# Patient Record
Sex: Male | Born: 1955 | Race: White | Hispanic: No | Smoking: Former smoker
Health system: Southern US, Community
[De-identification: ages and names within clinical notes are randomized; demographics above are authoritative.]

## PROBLEM LIST (undated history)

## (undated) DIAGNOSIS — Z8673 Personal history of transient ischemic attack (TIA), and cerebral infarction without residual deficits: Secondary | ICD-10-CM

## (undated) DIAGNOSIS — J449 Chronic obstructive pulmonary disease, unspecified: Secondary | ICD-10-CM

## (undated) DIAGNOSIS — G8190 Hemiplegia, unspecified affecting unspecified side: Secondary | ICD-10-CM

## (undated) DIAGNOSIS — H5440 Blindness, one eye, unspecified eye: Secondary | ICD-10-CM

## (undated) DIAGNOSIS — I5189 Other ill-defined heart diseases: Secondary | ICD-10-CM

## (undated) DIAGNOSIS — I10 Essential (primary) hypertension: Secondary | ICD-10-CM

## (undated) DIAGNOSIS — F32A Depression, unspecified: Secondary | ICD-10-CM

## (undated) DIAGNOSIS — Z86711 Personal history of pulmonary embolism: Secondary | ICD-10-CM

## (undated) DIAGNOSIS — D649 Anemia, unspecified: Secondary | ICD-10-CM

## (undated) DIAGNOSIS — I82419 Acute embolism and thrombosis of unspecified femoral vein: Secondary | ICD-10-CM

## (undated) DIAGNOSIS — Z720 Tobacco use: Secondary | ICD-10-CM

## (undated) DIAGNOSIS — I639 Cerebral infarction, unspecified: Secondary | ICD-10-CM

## (undated) DIAGNOSIS — I739 Peripheral vascular disease, unspecified: Secondary | ICD-10-CM

## (undated) DIAGNOSIS — I714 Abdominal aortic aneurysm, without rupture, unspecified: Secondary | ICD-10-CM

## (undated) DIAGNOSIS — I779 Disorder of arteries and arterioles, unspecified: Secondary | ICD-10-CM

## (undated) DIAGNOSIS — E119 Type 2 diabetes mellitus without complications: Secondary | ICD-10-CM

## (undated) HISTORY — DX: Blindness, one eye, unspecified eye: H54.40

## (undated) HISTORY — PX: CAROTID ENDARTERECTOMY: SUR193

## (undated) HISTORY — DX: Chronic obstructive pulmonary disease, unspecified: J44.9

## (undated) HISTORY — DX: Essential (primary) hypertension: I10

## (undated) HISTORY — DX: Type 2 diabetes mellitus without complications: E11.9

---

## 2015-07-04 ENCOUNTER — Ambulatory Visit (INDEPENDENT_AMBULATORY_CARE_PROVIDER_SITE_OTHER): Payer: Self-pay | Admitting: Family Medicine

## 2015-07-04 ENCOUNTER — Encounter: Payer: Self-pay | Admitting: Family Medicine

## 2015-07-04 VITALS — BP 119/80 | HR 99 | Temp 98.5°F | Resp 19 | Ht 69.0 in | Wt 221.0 lb

## 2015-07-04 DIAGNOSIS — I10 Essential (primary) hypertension: Secondary | ICD-10-CM

## 2015-07-04 DIAGNOSIS — E119 Type 2 diabetes mellitus without complications: Secondary | ICD-10-CM

## 2015-07-04 DIAGNOSIS — J449 Chronic obstructive pulmonary disease, unspecified: Secondary | ICD-10-CM

## 2015-07-04 DIAGNOSIS — H5440 Blindness, one eye, unspecified eye: Secondary | ICD-10-CM | POA: Insufficient documentation

## 2015-07-04 DIAGNOSIS — H5441 Blindness, right eye, normal vision left eye: Secondary | ICD-10-CM

## 2015-07-04 LAB — GLUCOSE, POCT (MANUAL RESULT ENTRY): POC GLUCOSE: 399 mg/dL — AB (ref 70–99)

## 2015-07-04 LAB — POCT GLYCOSYLATED HEMOGLOBIN (HGB A1C): Hemoglobin A1C: 9.5

## 2015-07-04 NOTE — Progress Notes (Signed)
Name: Chad Combs   MRN: [id number]    DOB: 1955-10-01   Date:07/04/2015       Progress Note  Subjective  Chief Complaint  Chief Complaint  Patient presents with  . Establish Care    NP    Diabetes He presents for his follow-up diabetic visit. He has type 2 diabetes mellitus. His disease course has been stable. There are no hypoglycemic associated symptoms. Pertinent negatives for hypoglycemia include no headaches. Associated symptoms include blurred vision (cannot see out of the right eye), foot paresthesias, polydipsia and polyuria. Pertinent negatives for diabetes include no chest pain and no fatigue. Symptoms are stable. Diabetic complications include peripheral neuropathy. Pertinent negatives for diabetic complications include no CVA (had a 'stroke in the right eye') or heart disease. Current diabetic treatment includes oral agent (monotherapy). His weight is stable. He is following a diabetic diet. Frequency home blood tests: does not check his Blood Glucose. An ACE inhibitor/angiotensin II receptor blocker is being taken.  Hypertension This is a chronic problem. The problem is controlled. Associated symptoms include blurred vision (cannot see out of the right eye) and shortness of breath. Pertinent negatives include no chest pain, headaches or palpitations. Past treatments include ACE inhibitors. There is no history of kidney disease, CAD/MI or CVA (had a 'stroke in the right eye').    Past Medical History  Diagnosis Date  . Diabetes mellitus without complication ([place])   . Hypertension   . COPD (chronic obstructive pulmonary disease) ([place])     History reviewed. No pertinent past surgical history.  History reviewed. No pertinent family history.  Social History   Social History  . Marital Status: Single    Spouse Name: N/A  . Number of Children: N/A  . Years of Education: N/A   Occupational History  . Not on file.   Social History Main Topics  . Smoking status: Current  Every Day Smoker -- 1.00 packs/day    Types: Cigarettes  . Smokeless tobacco: Never Used  . Alcohol Use: 0.0 oz/week    0 Standard drinks or equivalent per week     Comment: occasional  . Drug Use: No  . Sexual Activity: No   Other Topics Concern  . Not on file   Social History Narrative  . No narrative on file     Current outpatient prescriptions:  .  lisinopril (PRINIVIL,ZESTRIL) 20 MG tablet, Take 20 mg by mouth daily., Disp: , Rfl:  .  metFORMIN (GLUCOPHAGE) 500 MG tablet, Take 2 tablets by mouth 2 (two) times daily., Disp: , Rfl:   No Known Allergies   Review of Systems  Constitutional: Negative for fever, chills and fatigue.  Eyes: Positive for blurred vision (cannot see out of the right eye). Negative for double vision and pain.  Respiratory: Positive for shortness of breath.   Cardiovascular: Negative for chest pain and palpitations.  Gastrointestinal: Negative for abdominal pain.  Neurological: Negative for headaches.  Endo/Heme/Allergies: Positive for polydipsia.     Objective  Filed Vitals:   07/04/15 1032  BP: 119/80  Pulse: 99  Temp: 98.5 F (36.9 C)  TempSrc: Oral  Resp: 19  Height: 5\' 9"  (1.753 m)  Weight: 221 lb (100.245 kg)  SpO2: 94%    Physical Exam  Constitutional: He is oriented to person, place, and time and well-developed, well-nourished, and in no distress.  HENT:  Head: Normocephalic and atraumatic.  Eyes:  Right pupil non reactive to light, fixed and dilated  Cardiovascular: Normal rate, regular  rhythm and normal heart sounds.   Pulmonary/Chest: Effort normal and breath sounds normal.  Abdominal: Soft. Bowel sounds are normal.  Musculoskeletal: He exhibits edema (1+ pitting edema LE).  Neurological: He is alert and oriented to person, place, and time.  Nursing note and vitals reviewed.  Assessment & Plan  1. Essential hypertension BP well controlled on present therapy.  2. Diabetes mellitus without complication (HCC) We  will obtain A1c, glucose, lipids for assessment of diabetes status.  Will adjust medication accordingly. - POCT HgB A1C - POCT Glucose (CBG) - Lipid Profile - Comprehensive Metabolic Panel (CMET) - Urine Microalbumin w/creat. ratio  3. Blind right eye  4. Chronic obstructive pulmonary disease, unspecified COPD type ([place])    Wretha Laris Asad A. [facility] 07/04/2015 11:27 AM

## 2015-07-24 ENCOUNTER — Ambulatory Visit: Payer: Self-pay | Admitting: Family Medicine

## 2015-08-07 ENCOUNTER — Ambulatory Visit: Payer: Self-pay

## 2016-05-04 ENCOUNTER — Encounter: Payer: Self-pay | Admitting: Emergency Medicine

## 2016-05-04 ENCOUNTER — Emergency Department
Admission: EM | Admit: 2016-05-04 | Discharge: 2016-05-04 | Disposition: A | Discharge status: Home / Self Care | Payer: Self-pay | Attending: Emergency Medicine | Admitting: Emergency Medicine

## 2016-05-04 ENCOUNTER — Emergency Department: Payer: Self-pay

## 2016-05-04 DIAGNOSIS — I82412 Acute embolism and thrombosis of left femoral vein: Secondary | ICD-10-CM

## 2016-05-04 DIAGNOSIS — I82402 Acute embolism and thrombosis of unspecified deep veins of left lower extremity: Secondary | ICD-10-CM | POA: Insufficient documentation

## 2016-05-04 DIAGNOSIS — I10 Essential (primary) hypertension: Secondary | ICD-10-CM | POA: Insufficient documentation

## 2016-05-04 DIAGNOSIS — Z79899 Other long term (current) drug therapy: Secondary | ICD-10-CM | POA: Insufficient documentation

## 2016-05-04 DIAGNOSIS — Z7984 Long term (current) use of oral hypoglycemic drugs: Secondary | ICD-10-CM | POA: Insufficient documentation

## 2016-05-04 DIAGNOSIS — F17210 Nicotine dependence, cigarettes, uncomplicated: Secondary | ICD-10-CM | POA: Insufficient documentation

## 2016-05-04 DIAGNOSIS — E119 Type 2 diabetes mellitus without complications: Secondary | ICD-10-CM | POA: Insufficient documentation

## 2016-05-04 DIAGNOSIS — J449 Chronic obstructive pulmonary disease, unspecified: Secondary | ICD-10-CM | POA: Insufficient documentation

## 2016-05-04 MED ORDER — APIXABAN 5 MG PO TABS: 10.0000 mg | ORAL_TABLET | Freq: Two times a day (BID) | ORAL | 1 refills | Status: DC

## 2016-05-04 NOTE — ED Notes (Signed)
Pt to ed with c/o left leg swelling since this am.  Denies injury to leg.  Pt also reports pain in leg.  Pt is able to ambulate with assistance on leg. Family with pt at this time.

## 2016-05-04 NOTE — Care Management Note (Signed)
Case Management Note  Patient Details  Name: Chad Combs MRN: [id number] Date of Birth: 1955/06/27  Subjective/Objective:                Called [facility] across th street at MD request. They have Eliquis right now and can acomodate the patient. I have spoken to the patient and his son, and given them directions.  The son says he did set up the patient a year or so ago, and is aware of the location.   Action/Plan:   Expected Discharge Date:                  Expected Discharge Plan:     In-House Referral:     Discharge planning Services     Post Acute Care Choice:    Choice offered to:     DME Arranged:    DME Agency:     HH Arranged:    HH Agency:     Status of Service:     If discussed at [employer] of Stay Meetings, dates discussed:    Additional Comments:  Beau Fanny, RN 05/04/2016, 2:50 PM

## 2016-05-04 NOTE — ED Triage Notes (Signed)
Patient presents to the ED with redness, pain and swelling in his left leg.  Patient reports noting left thigh pain several days ago but over the night pain and swelling has progressed significantly.  Patient reports pain is increased with ambulation.

## 2016-05-04 NOTE — ED Provider Notes (Signed)
[facility], [place] Emergency Department Provider Note  ____________________________________________  Time seen: Approximately 2:55 PM  I have reviewed the triage vital signs and the nursing notes.   HISTORY  Chief Complaint Leg Swelling    HPI Chad Combs is a 60 y.o. male who complains of pain that started in his left thigh about 2 days ago, and then when he woke up this morning the entire leg on the left was swollen and red and painful. There had anything like this before. Denies any history of DVT or PE. No fever chills chest pain or shortness of breath. Has a history of hypertension and diabetes which is managed by [facility], who also provides his medications. No recent travel, hospitalization or surgery. He does smoke. Has a history of COPD as well.     Past Medical History:  Diagnosis Date  . Blind right eye   . COPD (chronic obstructive pulmonary disease) ([place])   . Diabetes mellitus without complication ([place])   . Hypertension      Patient Active Problem List   Diagnosis Date Noted  . Hypertension 07/04/2015  . Diabetes mellitus without complication ([place]) 0000000  . 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 COPD (chronic obstructive pulmonary disease) ([place]) 07/04/2015     History reviewed. No pertinent surgical history.   Prior to Admission medications   Medication Sig Start Date End Date Taking? Authorizing Provider  apixaban (ELIQUIS) 5 MG TABS tablet Take 2 tablets (10 mg total) by mouth 2 (two) times daily. After 7 days, decrease dose to 1 tablet (5 mg total) by mouth 2 (two) times daily. 05/04/16   Carrie Mew, MD  lisinopril (PRINIVIL,ZESTRIL) 20 MG tablet Take 20 mg by mouth daily.    Historical Provider, MD  metFORMIN (GLUCOPHAGE) 500 MG tablet Take 2 tablets by mouth 2 (two) times daily.    Historical Provider, MD     Allergies Patient has no known allergies.   Family History  Problem Relation Age of Onset   . Cancer Mother   . Heart disease Father     Social History Social History  Substance Use Topics  . Smoking status: Current Every Day Smoker    Packs/day: 1.00    Types: Cigarettes  . Smokeless tobacco: Never Used  . Alcohol use 0.0 oz/week     Comment: occasional (once every couple months)    Review of Systems  Constitutional:   No fever or chills.   Cardiovascular:   No chest pain. Respiratory:   No dyspnea or cough.  Musculoskeletal:  Left leg pain and swelling Neurological:   Negative for headaches 10-point ROS otherwise negative.  ____________________________________________   PHYSICAL EXAM:  VITAL SIGNS: ED Triage Vitals [05/04/16 1145]  Enc Vitals Group     BP (!) 140/98     Pulse Rate 87     Resp 16     Temp 97.6 F (36.4 C)     Temp Source Oral     SpO2 95 %     Weight 269 lb (122 kg)     Height 5\' 9"  (1.753 m)     Head Circumference      Peak Flow      Pain Score 8     Pain Loc      Pain Edu?      Excl. in [place]?     Vital signs reviewed, nursing assessments reviewed.   Constitutional:   Alert and oriented. Well appearing and in no distress.  Eyes:   No scleral icterus. No conjunctival pallor. PERRL. EOMI.  No nystagmus. ENT   Head:   Normocephalic and atraumatic.   Nose:   No congestion/rhinnorhea. No septal hematoma   Mouth/Throat:   MMM, no pharyngeal erythema. No peritonsillar mass.    Neck:   No stridor. No SubQ emphysema. No meningismus. Hematological/Lymphatic/Immunilogical:   No cervical lymphadenopathy. Cardiovascular:   RRR. Symmetric bilateral radial and DP pulses.  No murmurs.  Respiratory:   Normal respiratory effort without tachypnea nor retractions. Breath sounds are clear and equal bilaterally. No wheezes/rales/rhonchi. Musculoskeletal:   Diffuse faint erythema of the left leg with pitting edema. Right lower extremity is normal. Left calf circumference much greater. Mild tenderness to the touch in the posterior  thigh. Neurologic:   Normal speech and language.  CN 2-10 normal. Motor grossly intact. No gross focal neurologic deficits are appreciated.  Skin:    Skin is warm, dry and intact. No rash noted.  No petechiae, purpura, or bullae.  ____________________________________________    LABS (pertinent positives/negatives) (all labs ordered are listed, but only abnormal results are displayed) Labs Reviewed - No data to display ____________________________________________   EKG    ____________________________________________    RADIOLOGY  Ultrasound left lower extremity reveals acute occlusive diffuse DVT.  ____________________________________________   PROCEDURES Procedures  ____________________________________________   INITIAL IMPRESSION / ASSESSMENT AND PLAN / ED COURSE  Pertinent labs & imaging results that were available during my care of the patient were reviewed by me and considered in my medical decision making (see chart for details).  Patient presents with DVT in the left leg. Uncomplicated currently. No clear etiology of this event other than smoking and comorbidities. No evidence of PE at this time. Vital signs are unremarkable the patient is calm and comfortable and not in distress. I worked with case management who called over to a medication management clinic and confirmed that they would be able to provide this patient Eliquis. I have written a prescription for the initial 7 day loading course followed by continued medication will have the patient follow-up with his doctor tomorrow for continued management throughout this illness and ensuring that he has access to medication. No contraindications identified to anticoagulation. No history of brain surgery or metastatic cancer. No gastrointestinal bleeding. Return precautions given regarding worsening symptoms including chest pain shortness of breath or dizziness.   Clinical Course     ____________________________________________   FINAL CLINICAL IMPRESSION(S) / ED DIAGNOSES  Final diagnoses:  Acute deep vein thrombosis (DVT) of femoral vein of left lower extremity (HCC)       Portions of this note were generated with dragon dictation software. Dictation errors may occur despite best attempts at proofreading.    Carrie Mew, MD 05/04/16 1500

## 2016-07-26 ENCOUNTER — Ambulatory Visit: Payer: Self-pay | Admitting: Pharmacy Technician

## 2016-07-26 ENCOUNTER — Encounter (INDEPENDENT_AMBULATORY_CARE_PROVIDER_SITE_OTHER): Payer: Self-pay

## 2016-07-26 DIAGNOSIS — Z79899 Other long term (current) drug therapy: Secondary | ICD-10-CM

## 2016-07-26 NOTE — Progress Notes (Signed)
Completed [facility] application and contract.  Patient agreed to all terms of the [facility] contract.  Patient to provide 2018 investment statement from [employer] and 2018 bank statement from [employer].  Provided patient with Civil engineer, contracting based on his particular needs.    Eliquis PAP application submitted to [facility].    [facility]

## 2016-08-21 ENCOUNTER — Emergency Department: Payer: Self-pay

## 2016-08-21 ENCOUNTER — Encounter: Payer: Self-pay | Admitting: Emergency Medicine

## 2016-08-21 ENCOUNTER — Inpatient Hospital Stay
Admission: EM | Admit: 2016-08-21 | Discharge: 2016-08-25 | DRG: 040 | Disposition: A | Discharge status: Rehabilitation Facility | Payer: Self-pay | Attending: Internal Medicine | Admitting: Internal Medicine

## 2016-08-21 DIAGNOSIS — Z86718 Personal history of other venous thrombosis and embolism: Secondary | ICD-10-CM

## 2016-08-21 DIAGNOSIS — G936 Cerebral edema: Secondary | ICD-10-CM | POA: Diagnosis present

## 2016-08-21 DIAGNOSIS — Z9114 Patient's other noncompliance with medication regimen: Secondary | ICD-10-CM

## 2016-08-21 DIAGNOSIS — F17210 Nicotine dependence, cigarettes, uncomplicated: Secondary | ICD-10-CM | POA: Diagnosis present

## 2016-08-21 DIAGNOSIS — I2510 Atherosclerotic heart disease of native coronary artery without angina pectoris: Secondary | ICD-10-CM | POA: Diagnosis present

## 2016-08-21 DIAGNOSIS — I10 Essential (primary) hypertension: Secondary | ICD-10-CM | POA: Diagnosis present

## 2016-08-21 DIAGNOSIS — E781 Pure hyperglyceridemia: Secondary | ICD-10-CM | POA: Diagnosis present

## 2016-08-21 DIAGNOSIS — E875 Hyperkalemia: Secondary | ICD-10-CM | POA: Diagnosis present

## 2016-08-21 DIAGNOSIS — E119 Type 2 diabetes mellitus without complications: Secondary | ICD-10-CM | POA: Diagnosis present

## 2016-08-21 DIAGNOSIS — I639 Cerebral infarction, unspecified: Secondary | ICD-10-CM | POA: Diagnosis present

## 2016-08-21 DIAGNOSIS — Z79899 Other long term (current) drug therapy: Secondary | ICD-10-CM

## 2016-08-21 DIAGNOSIS — Z8673 Personal history of transient ischemic attack (TIA), and cerebral infarction without residual deficits: Secondary | ICD-10-CM

## 2016-08-21 DIAGNOSIS — D72829 Elevated white blood cell count, unspecified: Secondary | ICD-10-CM | POA: Diagnosis present

## 2016-08-21 DIAGNOSIS — K760 Fatty (change of) liver, not elsewhere classified: Secondary | ICD-10-CM | POA: Diagnosis present

## 2016-08-21 DIAGNOSIS — R531 Weakness: Secondary | ICD-10-CM

## 2016-08-21 DIAGNOSIS — R0602 Shortness of breath: Secondary | ICD-10-CM

## 2016-08-21 DIAGNOSIS — R29810 Facial weakness: Secondary | ICD-10-CM | POA: Diagnosis present

## 2016-08-21 DIAGNOSIS — Z7984 Long term (current) use of oral hypoglycemic drugs: Secondary | ICD-10-CM

## 2016-08-21 DIAGNOSIS — O2230 Deep phlebothrombosis in pregnancy, unspecified trimester: Secondary | ICD-10-CM

## 2016-08-21 DIAGNOSIS — I63511 Cerebral infarction due to unspecified occlusion or stenosis of right middle cerebral artery: Principal | ICD-10-CM | POA: Diagnosis present

## 2016-08-21 DIAGNOSIS — I6523 Occlusion and stenosis of bilateral carotid arteries: Secondary | ICD-10-CM | POA: Diagnosis present

## 2016-08-21 DIAGNOSIS — D751 Secondary polycythemia: Secondary | ICD-10-CM | POA: Diagnosis present

## 2016-08-21 DIAGNOSIS — Z7901 Long term (current) use of anticoagulants: Secondary | ICD-10-CM

## 2016-08-21 DIAGNOSIS — H5461 Unqualified visual loss, right eye, normal vision left eye: Secondary | ICD-10-CM | POA: Diagnosis present

## 2016-08-21 DIAGNOSIS — J449 Chronic obstructive pulmonary disease, unspecified: Secondary | ICD-10-CM | POA: Diagnosis present

## 2016-08-21 DIAGNOSIS — G8194 Hemiplegia, unspecified affecting left nondominant side: Secondary | ICD-10-CM | POA: Diagnosis present

## 2016-08-21 LAB — CBC
HCT: 58.9 % — ABNORMAL HIGH (ref 40.0–52.0)
HEMOGLOBIN: 20.6 g/dL — AB (ref 13.0–18.0)
MCH: 33.3 pg (ref 26.0–34.0)
MCHC: 34.9 g/dL (ref 32.0–36.0)
MCV: 95.3 fL (ref 80.0–100.0)
PLATELETS: 160 10*3/uL (ref 150–440)
RBC: 6.18 MIL/uL — AB (ref 4.40–5.90)
RDW: 13.1 % (ref 11.5–14.5)
WBC: 17.9 10*3/uL — AB (ref 3.8–10.6)

## 2016-08-21 LAB — COMPREHENSIVE METABOLIC PANEL
ALBUMIN: 4.6 g/dL (ref 3.5–5.0)
ALK PHOS: 76 U/L (ref 38–126)
ALT: 120 U/L — AB (ref 17–63)
ANION GAP: 12 (ref 5–15)
AST: 59 U/L — ABNORMAL HIGH (ref 15–41)
BILIRUBIN TOTAL: 1.2 mg/dL (ref 0.3–1.2)
BUN: 20 mg/dL (ref 6–20)
CALCIUM: 9.7 mg/dL (ref 8.9–10.3)
CO2: 22 mmol/L (ref 22–32)
CREATININE: 1.05 mg/dL (ref 0.61–1.24)
Chloride: 100 mmol/L — ABNORMAL LOW (ref 101–111)
GFR calc non Af Amer: >60 mL/min (ref >60)
GLUCOSE: 305 mg/dL — AB (ref 65–99)
Potassium: 5.7 mmol/L — ABNORMAL HIGH (ref 3.5–5.1)
Sodium: 134 mmol/L — ABNORMAL LOW (ref 135–145)
TOTAL PROTEIN: 8.4 g/dL — AB (ref 6.5–8.1)

## 2016-08-21 LAB — DIFFERENTIAL
Basophils Absolute: 0.2 10*3/uL — ABNORMAL HIGH (ref 0–0.1)
Basophils Relative: 1 %
EOS PCT: 0 %
Eosinophils Absolute: 0 10*3/uL (ref 0–0.7)
LYMPHS ABS: 1.4 10*3/uL (ref 1.0–3.6)
LYMPHS PCT: 8 %
Monocytes Absolute: 0.7 10*3/uL (ref 0.2–1.0)
Monocytes Relative: 4 %
NEUTROS ABS: 15.7 10*3/uL — AB (ref 1.4–6.5)
Neutrophils Relative %: 87 %

## 2016-08-21 LAB — PROTIME-INR
INR: 1
Prothrombin Time: 13.2 seconds (ref 11.4–15.2)

## 2016-08-21 LAB — TROPONIN I: Troponin I: <0.03 ng/mL (ref <0.03)

## 2016-08-21 LAB — GLUCOSE, CAPILLARY: Glucose-Capillary: 314 mg/dL — ABNORMAL HIGH (ref 65–99)

## 2016-08-21 LAB — APTT: aPTT: 32 seconds (ref 24–36)

## 2016-08-21 MED ORDER — ASPIRIN 81 MG PO CHEW
324.0000 mg | CHEWABLE_TABLET | Freq: Once | ORAL | Status: AC
  Administered 2016-08-21: 324 mg via ORAL
  Filled 2016-08-21: qty 4

## 2016-08-21 MED ORDER — LISINOPRIL 20 MG PO TABS
20.0000 mg | ORAL_TABLET | Freq: Every day | ORAL | Status: DC
  Administered 2016-08-22 – 2016-08-25 (×3): 20 mg via ORAL
  Filled 2016-08-21 (×3): qty 1

## 2016-08-21 NOTE — ED Provider Notes (Signed)
[facility] Emergency Department Provider Note   ____________________________________________   I have reviewed the triage vital signs and the nursing notes.   HISTORY  Chief Complaint Left sided weakness  History limited by: Poor historian   HPI Chad Combs is a 61 y.o. male who presents to the emergency department today via EMS because of concerns for being found on the ground and left-sided weakness. Per EMS family found him on the ground after not seen any activity on his face book page for roughly 10 hours. The patient saw his breakfast tray out. The patient himself states that he think he fell in the afternoon. The patient denies any complaints of left-sided weakness or slurred speech. Patient does have a history of stroke. Patient apparently also has history of deep vein thrombosis but denies being on any blood thinning medication.    Past Medical History:  Diagnosis Date  . Blind right eye   . COPD (chronic obstructive pulmonary disease) ([place])   . Diabetes mellitus without complication ([place])   . Hypertension     Patient Active Problem List   Diagnosis Date Noted  . Hypertension 07/04/2015  . Diabetes mellitus without complication ([place]) 0000000  . 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 COPD (chronic obstructive pulmonary disease) ([place]) 07/04/2015    No past surgical history on file.  Prior to Admission medications   Medication Sig Start Date End Date Taking? Authorizing Provider  apixaban (ELIQUIS) 5 MG TABS tablet Take 2 tablets (10 mg total) by mouth 2 (two) times daily. After 7 days, decrease dose to 1 tablet (5 mg total) by mouth 2 (two) times daily. 05/04/16   Carrie Mew, MD  lisinopril (PRINIVIL,ZESTRIL) 20 MG tablet Take 20 mg by mouth daily.    Historical Provider, MD  metFORMIN (GLUCOPHAGE) 500 MG tablet Take 2 tablets by mouth 2 (two) times daily.    Historical Provider, MD    Allergies Patient has no known allergies.  Family  History  Problem Relation Age of Onset  . Cancer Mother   . Heart disease Father     Social History Social History  Substance Use Topics  . Smoking status: Current Every Day Smoker    Packs/day: 1.00    Types: Cigarettes  . Smokeless tobacco: Never Used  . Alcohol use 0.0 oz/week     Comment: occasional (once every couple months)    Review of Systems  Constitutional: Negative for fever. Cardiovascular: Negative for chest pain. Respiratory: Negative for shortness of breath. Gastrointestinal: Negative for abdominal pain, vomiting and diarrhea. Neurological: Negative for headaches, focal weakness or numbness. Negative for slurred speech.  10-point ROS otherwise negative.  ____________________________________________   PHYSICAL EXAM:  VITAL SIGNS: ED Triage Vitals  Enc Vitals Group     BP 132/82     Pulse 92     Resp 18     Temp 97.5     Temp src      SpO2 94    Constitutional: Awake and alert. Not completely oriented to events.  Eyes: Conjunctivae are normal. Normal extraocular movements. PERRL. ENT   Head: Normocephalic and atraumatic.   Nose: No congestion/rhinnorhea.   Mouth/Throat: Mucous membranes are moist.   Neck: No stridor. Hematological/Lymphatic/Immunilogical: No cervical lymphadenopathy. Cardiovascular: Normal rate, regular rhythm.  No murmurs, rubs, or gallops.  Respiratory: Normal respiratory effort without tachypnea nor retractions. Breath sounds are clear and equal bilaterally. No wheezes/rales/rhonchi. Gastrointestinal: Soft and non tender. No rebound. No guarding.  Genitourinary: Deferred Musculoskeletal:  Normal range of motion in all extremities. No lower extremity edema. Neurologic:  Slurred speech. Left facial droop. Left arm unable to elevate off the bed. Left leg unable to elevate off the bed. Patient states sensation is intact in all limbs.  Skin:  Skin is warm, dry and intact. No rash  noted.  ____________________________________________    LABS (pertinent positives/negatives)  Labs Reviewed  CBC - Abnormal; Notable for the following:       Result Value   WBC 17.9 (*)    RBC 6.18 (*)    Hemoglobin 20.6 (*)    HCT 58.9 (*)    All other components within normal limits  DIFFERENTIAL - Abnormal; Notable for the following:    Neutro Abs 15.7 (*)    Basophils Absolute 0.2 (*)    All other components within normal limits  COMPREHENSIVE METABOLIC PANEL - Abnormal; Notable for the following:    Sodium 134 (*)    Potassium 5.7 (*)    Chloride 100 (*)    Glucose, Bld 305 (*)    Total Protein 8.4 (*)    AST 59 (*)    ALT 120 (*)    All other components within normal limits  PROTIME-INR  APTT  TROPONIN I  CBG MONITORING, ED   \  ____________________________________________   EKG  I, Nance Pear, attending physician, personally viewed and interpreted this EKG  EKG Time: 2048 Rate: 86 Rhythm: normal sinus rhythm Axis: normal Intervals: qtc 444 QRS: narrow, q waves V1, V2 ST changes: no st elevation Impression: abnormal ekg  ____________________________________________    RADIOLOGY  CT head IMPRESSION:  1. Evolving acute infarct at the right parietal and temporal lobes,  and right basal ganglia. Mild mass-effect on the frontal horn of the  right lateral ventricle. No midline shift seen. No evidence of  hemorrhagic transformation at this time.  2. Minimally displaced fracture through the distal tip of the nasal  bone, of indeterminate age.   I, Nance Pear, personally discussed these images and results by phone with the on-call radiologist and used this discussion as part of my medical decision making.   ____________________________________________   PROCEDURES  Procedures  CRITICAL CARE Performed by: Nance Pear   Total critical care time: 40 minutes  Critical care time was exclusive of separately billable procedures and  treating other patients.  Critical care was necessary to treat or prevent imminent or life-threatening deterioration.  Critical care was time spent personally by me on the following activities: development of treatment plan with patient and/or surrogate as well as nursing, discussions with consultants, evaluation of patient's response to treatment, examination of patient, obtaining history from patient or surrogate, ordering and performing treatments and interventions, ordering and review of laboratory studies, ordering and review of radiographic studies, pulse oximetry and re-evaluation of patient's condition. ____________________________________________   INITIAL IMPRESSION / ASSESSMENT AND PLAN / ED COURSE  Pertinent labs & imaging results that were available during my care of the patient were reviewed by me and considered in my medical decision making (see chart for details).  Patient presented to the emergency department today via EMS because of concerns for left-sided weakness. On exam patient has profound left-sided weakness as well as slurred speech. Family did arrive and confirmed that they last think he was well this morning. Unfortunately the patient is not a candidate for TPA given that last none known normal was outside of 4 hours. CT head did show a stroke. Patient will be given aspirin. The  patient will be admitted to the hospital service.  ____________________________________________   FINAL CLINICAL IMPRESSION(S) / ED DIAGNOSES  Final diagnoses:  Cerebrovascular accident (CVA), unspecified mechanism ([place])  Left-sided weakness     Note: This dictation was prepared with Dragon dictation. Any transcriptional errors that result from this process are unintentional     Nance Pear, MD 08/21/16 2210

## 2016-08-21 NOTE — H&P (Signed)
[place] @ [facility] Admission History and Physical Chad Combs, D.O.  ---------------------------------------------------------------------------------------------------------------------   PATIENT NAME: Chad Combs MR#: [id number] DATE OF BIRTH: [birth year] DATE OF ADMISSION: 08/21/2016 PRIMARY CARE PHYSICIAN: [facility]  REQUESTING/REFERRING PHYSICIAN: ED Dr. Dr. Archie Balboa  CHIEF COMPLAINT: Chief Complaint  Patient presents with  . Cerebrovascular Accident    HISTORY OF PRESENT ILLNESS: Chad Combs is a 61 y.o. male with a known history of COPD, DM, HTN Presents to the emergency department via ambulance secondary to left-sided weakness. Patient's family reportedly checked on the patient after they did not see him on social media for about 10 hours. Patient states his symptoms started around 3 PM today, about 5-6 hours prior to arrival in the emergency department although he was by himself. He reports sudden onset of left-sided weakness associated with the fall. His symptoms have persisted.  Otherwise there has been no change in status. Patient has been taking medication as prescribed and there has been no recent change in medication or diet.  There has been no recent illness, travel or sick contacts.    Patient denies fevers/chills, weakness, dizziness, chest pain, shortness of breath, N/V/C/D, abdominal pain, dysuria/frequency, changes in mental status.   EMS/ED COURSE:   Patient rec'd aspirin  PAST MEDICAL HISTORY: Past Medical History:  Diagnosis Date  . Blind right eye   . COPD (chronic obstructive pulmonary disease) ([place])   . Diabetes mellitus without complication ([place])   . Hypertension       PAST SURGICAL HISTORY: History reviewed. No pertinent surgical history.    SOCIAL HISTORY: Social History  Substance Use Topics  . Smoking status: Current Every Day Smoker    Packs/day: 1.00    Types: Cigarettes  . Smokeless  tobacco: Never Used  . Alcohol use 0.0 oz/week     Comment: occasional (once every couple months)      FAMILY HISTORY: Family History  Problem Relation Age of Onset  . Cancer Mother   . Heart disease Father      MEDICATIONS AT HOME: Prior to Admission medications   Medication Sig Start Date End Date Taking? Authorizing Provider  apixaban (ELIQUIS) 5 MG TABS tablet Take 2 tablets (10 mg total) by mouth 2 (two) times daily. After 7 days, decrease dose to 1 tablet (5 mg total) by mouth 2 (two) times daily. 05/04/16  Yes Carrie Mew, MD  Fluticasone-Salmeterol (ADVAIR) 100-50 MCG/DOSE AEPB Inhale 1 puff into the lungs 2 (two) times daily.   Yes Historical Provider, MD  gabapentin (NEURONTIN) 100 MG capsule Take 100 mg by mouth 2 (two) times daily as needed.   Yes Historical Provider, MD  lisinopril (PRINIVIL,ZESTRIL) 20 MG tablet Take 20 mg by mouth daily.   Yes Historical Provider, MD  metFORMIN (GLUCOPHAGE) 500 MG tablet Take 1,000 mg by mouth 2 (two) times daily.    Yes Historical Provider, MD      DRUG ALLERGIES: No Known Allergies   REVIEW OF SYSTEMS: CONSTITUTIONAL: No fever/chills, fatigue, weakness, weight gain/loss, headache EYES: No blurry or double vision. ENT: No tinnitus, postnasal drip, redness or soreness of the oropharynx. RESPIRATORY: No cough, wheeze, hemoptysis, dyspnea. CARDIOVASCULAR: No chest pain, orthopnea, palpitations, syncope. GASTROINTESTINAL: No nausea, vomiting, constipation, diarrhea, abdominal pain, hematemesis, melena or hematochezia. GENITOURINARY: No dysuria or hematuria. ENDOCRINE: No polyuria or nocturia. No heat or cold intolerance. HEMATOLOGY: No anemia, bruising, bleeding. INTEGUMENTARY: No rashes, ulcers, lesions. MUSCULOSKELETAL: No arthritis, swelling, gout. NEUROLOGIC: No numbness, tingling, weakness or ataxia. No  seizure-type activity.Positive slurred speech, left sided weakness PSYCHIATRIC: No anxiety, depression,  insomnia.  PHYSICAL EXAMINATION: VITAL SIGNS: Blood pressure 135/84, pulse 95, temperature 97.5 F (36.4 C), resp. rate 18, height 5\' 10"  (1.778 m), weight 114.8 kg (253 lb), SpO2 96 %.  GENERAL: 61 y.o.-year-old white male patient, well-developed, well-nourished lying in the bed in no acute distress.  Pleasant and cooperative.   HEENT: Head atraumatic, normocephalic. Pupils equal, round, reactive to light and accommodation. No scleral icterus. Extraocular muscles intact. Nares are patent. Oropharynx is clear. Mucus membranes moist. NECK: Supple, full range of motion. No JVD, no bruit heard. No thyroid enlargement, no tenderness, no cervical lymphadenopathy. CHEST: Normal breath sounds bilaterally. No wheezing, rales, rhonchi or crackles. No use of accessory muscles of respiration.  No reproducible chest wall tenderness.  CARDIOVASCULAR: S1, S2 normal. No murmurs, rubs, or gallops. Cap refill <2 seconds. ABDOMEN: Soft, nontender, nondistended. No rebound, guarding, rigidity. Normoactive bowel sounds present in all four quadrants. No organomegaly or mass. EXTREMITIES: Full range of motion. No pedal edema, cyanosis, or clubbing. NEUROLOGIC: Patient is a and O 3. Speech is significantly slurred. There is significant left-sided facial droop. He is known to be blind in his right eye. Motor strength in the right upper extremity and right lower extremity is 5/5. Motor strength in the left upper and lower extremity is 0/5 Sensation intact. Gait not checked. PSYCHIATRIC: The patient is alert and oriented x 3. Normal affect, mood, thought content. SKIN: Warm, dry, and intact without obvious rash, lesion, or ulcer.  LABORATORY PANEL:  CBC  Recent Labs Lab 08/21/16 2056  WBC 17.9*  HGB 20.6*  HCT 58.9*  PLT 160   ----------------------------------------------------------------------------------------------------------------- Chemistries  Recent Labs Lab 08/21/16 2056  NA 134*  K 5.7*  CL  100*  CO2 22  GLUCOSE 305*  BUN 20  CREATININE 1.05  CALCIUM 9.7  AST 59*  ALT 120*  ALKPHOS 76  BILITOT 1.2   ------------------------------------------------------------------------------------------------------------------ Cardiac Enzymes  Recent Labs Lab 08/21/16 2056  TROPONINI <0.03   ------------------------------------------------------------------------------------------------------------------  RADIOLOGY: Ct Head Wo Contrast  Result Date: 08/21/2016 CLINICAL DATA:  Patient found down. Unable to move left arm or leg, acute onset. Initial encounter. EXAM: CT HEAD WITHOUT CONTRAST TECHNIQUE: Contiguous axial images were obtained from the base of the skull through the vertex without intravenous contrast. COMPARISON:  None. FINDINGS: Brain: There is an evolving acute infarct at the right parietal and temporal lobes, and right basal ganglia. There is mild mass-effect on the frontal horn of the right lateral ventricle. No midline shift is seen. There is no evidence of hemorrhagic transformation at this time. There is no evidence of hydrocephalus or extra-axial collection. No mass lesion is seen. The posterior fossa, including the cerebellum, brainstem and fourth ventricle, is within normal limits. The third and lateral ventricles are unremarkable in appearance. Vascular: No hyperdense vessel or unexpected calcification. Skull: There appears to be a minimally displaced fracture through the distal tip of the nasal bone, of indeterminate age. Sinuses/Orbits: The orbits are within normal limits. The paranasal sinuses and mastoid air cells are well-aerated. Other: No significant soft tissue abnormalities are seen. IMPRESSION: 1. Evolving acute infarct at the right parietal and temporal lobes, and right basal ganglia. Mild mass-effect on the frontal horn of the right lateral ventricle. No midline shift seen. No evidence of hemorrhagic transformation at this time. 2. Minimally displaced fracture  through the distal tip of the nasal bone, of indeterminate age. These results were called by telephone at  the time of interpretation on 08/21/2016 at 9:47 pm to Dr. Nance Pear , who verbally acknowledged these results. Electronically Signed   By: Garald Balding M.D.   On: 08/21/2016 21:48    EKG:  Normal Sinus at 86 bpm with normal axis and nonspecific ST T wave changes.  Q waves in V1 and V2.  IMPRESSION AND PLAN:  This is a 61 y.o. male with a history of COPD, DM, HTN now being admitted with:  1. CVA - acute infarct at the right parietal and temporal lobes, and right basal ganglia - Admit telemetry observation for neuro workup including: - Studies: MRA/MRI, Echo, Carotids - Labs: CBC, BMP, Lipids, TFTs, A1C - Nursing: Neurochecks, O2, dysphagia screen, permissive hypertension.  - Consults: Neurology, PT/OT, S/S consults.  - Meds: Daily aspirin 81mg .   - Fluids: IVNS@75cc /hr.   - Routine DVT Px: with Lovenox, SCDs, early ambulation  2. Hyperkalemia - Monitor BMP  3. Leukocytosis / Erythrocytosis, no evidence of active infection - Monitor CBC - Check blood cultures - Heme consult  4. Elevated LFTs, mild - Monitor CMP in AM  5. H/o COPD - Continue Advair  6. H/O DM with hyperglycemia - Accuchecks achs with RISS coverage - NPO - Continue neurontin - Hold metformin  7. H/o HTN - Continue Lisinopril with hold parameters  Code Status: Full  All the records are reviewed and case discussed with ED provider. Management plans discussed with the patient and/or family who express understanding and agree with plan of care.   TOTAL TIME TAKING CARE OF THIS PATIENT: 60 minutes.   Chad Combs D.O. on 08/21/2016 at 11:08 PM Between 7am to 6pm - Pager - [phone] After 6pm go to [email or web address] - Proofreader [facility] Office [phone] CC: Primary care physician; [facility]     Note: This dictation was  prepared with Dragon dictation along with smaller phrase technology. Any transcriptional errors that result from this process are unintentional.

## 2016-08-21 NOTE — ED Notes (Signed)
Pt taken to CT.

## 2016-08-21 NOTE — ED Notes (Signed)
ED Provider at bedside. 

## 2016-08-21 NOTE — ED Notes (Signed)
Family at bedside. 

## 2016-08-21 NOTE — ED Triage Notes (Signed)
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 Pt is aware to self, location, and time.

## 2016-08-22 ENCOUNTER — Inpatient Hospital Stay
Admit: 2016-08-22 | Discharge: 2016-08-22 | Disposition: A | Discharge status: Home / Self Care | Payer: Self-pay | Attending: Family Medicine | Admitting: Family Medicine

## 2016-08-22 ENCOUNTER — Encounter: Payer: Self-pay | Admitting: Family Medicine

## 2016-08-22 ENCOUNTER — Inpatient Hospital Stay: Payer: Self-pay

## 2016-08-22 DIAGNOSIS — I638 Other cerebral infarction: Secondary | ICD-10-CM

## 2016-08-22 LAB — CBC WITH DIFFERENTIAL/PLATELET
BASOS ABS: 0.2 10*3/uL — AB (ref 0–0.1)
BASOS PCT: 1 %
EOS ABS: 0.1 10*3/uL (ref 0–0.7)
EOS PCT: 0 %
HCT: 54.4 % — ABNORMAL HIGH (ref 40.0–52.0)
Hemoglobin: 19 g/dL — ABNORMAL HIGH (ref 13.0–18.0)
Lymphocytes Relative: 16 %
Lymphs Abs: 2.7 10*3/uL (ref 1.0–3.6)
MCH: 33.5 pg (ref 26.0–34.0)
MCHC: 34.9 g/dL (ref 32.0–36.0)
MCV: 95.8 fL (ref 80.0–100.0)
MONO ABS: 0.9 10*3/uL (ref 0.2–1.0)
Monocytes Relative: 5 %
NEUTROS ABS: 13.6 10*3/uL — AB (ref 1.4–6.5)
Neutrophils Relative %: 78 %
PLATELETS: 161 10*3/uL (ref 150–440)
RBC: 5.67 MIL/uL (ref 4.40–5.90)
RDW: 12.8 % (ref 11.5–14.5)
WBC: 17.5 10*3/uL — ABNORMAL HIGH (ref 3.8–10.6)

## 2016-08-22 LAB — LIPID PANEL
Cholesterol: 193 mg/dL (ref 0–200)
HDL: 35 mg/dL — ABNORMAL LOW (ref >40)
LDL CALC: UNDETERMINED mg/dL (ref 0–99)
Total CHOL/HDL Ratio: 5.5 RATIO
Triglycerides: 441 mg/dL — ABNORMAL HIGH (ref <150)
VLDL: UNDETERMINED mg/dL (ref 0–40)

## 2016-08-22 LAB — COMPREHENSIVE METABOLIC PANEL
ALBUMIN: 4 g/dL (ref 3.5–5.0)
ALK PHOS: 65 U/L (ref 38–126)
ALT: 98 U/L — AB (ref 17–63)
ANION GAP: 11 (ref 5–15)
AST: 47 U/L — ABNORMAL HIGH (ref 15–41)
BILIRUBIN TOTAL: 1.2 mg/dL (ref 0.3–1.2)
BUN: 19 mg/dL (ref 6–20)
CALCIUM: 9.3 mg/dL (ref 8.9–10.3)
CO2: 24 mmol/L (ref 22–32)
CREATININE: 0.95 mg/dL (ref 0.61–1.24)
Chloride: 103 mmol/L (ref 101–111)
GFR calc Af Amer: >60 mL/min (ref >60)
GFR calc non Af Amer: >60 mL/min (ref >60)
Glucose, Bld: 271 mg/dL — ABNORMAL HIGH (ref 65–99)
Potassium: 4.1 mmol/L (ref 3.5–5.1)
Sodium: 138 mmol/L (ref 135–145)
TOTAL PROTEIN: 7.7 g/dL (ref 6.5–8.1)

## 2016-08-22 LAB — URINE DRUG SCREEN, QUALITATIVE (ARMC ONLY)
Amphetamines, Ur Screen: NOT DETECTED
BARBITURATES, UR SCREEN: NOT DETECTED
BENZODIAZEPINE, UR SCRN: NOT DETECTED
COCAINE METABOLITE, UR ~~LOC~~: NOT DETECTED
Cannabinoid 50 Ng, Ur ~~LOC~~: NOT DETECTED
MDMA (Ecstasy)Ur Screen: NOT DETECTED
Methadone Scn, Ur: NOT DETECTED
OPIATE, UR SCREEN: NOT DETECTED
PHENCYCLIDINE (PCP) UR S: NOT DETECTED
Tricyclic, Ur Screen: NOT DETECTED

## 2016-08-22 LAB — URINALYSIS, DIPSTICK ONLY
BILIRUBIN URINE: NEGATIVE
Glucose, UA: >=500 mg/dL — AB
Ketones, ur: 20 mg/dL — AB
Leukocytes, UA: NEGATIVE
NITRITE: NEGATIVE
PROTEIN: 100 mg/dL — AB
SPECIFIC GRAVITY, URINE: 1.029 (ref 1.005–1.030)
pH: 5 (ref 5.0–8.0)

## 2016-08-22 LAB — ECHOCARDIOGRAM COMPLETE
HEIGHTINCHES: 69 in
WEIGHTICAEL: 3320 [oz_av]

## 2016-08-22 LAB — TROPONIN I
TROPONIN I: 0.05 ng/mL — AB (ref <0.03)
Troponin I: <0.03 ng/mL (ref <0.03)

## 2016-08-22 LAB — GLUCOSE, CAPILLARY
GLUCOSE-CAPILLARY: 302 mg/dL — AB (ref 65–99)
GLUCOSE-CAPILLARY: 182 mg/dL — AB (ref 65–99)
GLUCOSE-CAPILLARY: 298 mg/dL — AB (ref 65–99)
Glucose-Capillary: 204 mg/dL — ABNORMAL HIGH (ref 65–99)

## 2016-08-22 MED ORDER — ASPIRIN 325 MG PO TABS
325.0000 mg | ORAL_TABLET | Freq: Every day | ORAL | Status: DC
  Administered 2016-08-22: 325 mg via ORAL
  Filled 2016-08-22: qty 1

## 2016-08-22 MED ORDER — INSULIN ASPART 100 UNIT/ML ~~LOC~~ SOLN
0.0000 [IU] | Freq: Three times a day (TID) | SUBCUTANEOUS | Status: DC
  Administered 2016-08-22: 21:00:00 11 [IU] via SUBCUTANEOUS
  Administered 2016-08-22: 13:00:00 7 [IU] via SUBCUTANEOUS
  Administered 2016-08-22: 09:00:00 15 [IU] via SUBCUTANEOUS
  Administered 2016-08-22: 18:00:00 4 [IU] via SUBCUTANEOUS
  Administered 2016-08-23: 11 [IU] via SUBCUTANEOUS
  Administered 2016-08-23: 18:00:00 7 [IU] via SUBCUTANEOUS
  Administered 2016-08-23: 21:00:00 11 [IU] via SUBCUTANEOUS
  Administered 2016-08-23 – 2016-08-24 (×2): 7 [IU] via SUBCUTANEOUS
  Administered 2016-08-24: 11 [IU] via SUBCUTANEOUS
  Administered 2016-08-24: 18:00:00 4 [IU] via SUBCUTANEOUS
  Administered 2016-08-24: 13:00:00 11 [IU] via SUBCUTANEOUS
  Administered 2016-08-25 (×2): 7 [IU] via SUBCUTANEOUS
  Filled 2016-08-22: qty 7
  Filled 2016-08-22: qty 11
  Filled 2016-08-22: qty 15
  Filled 2016-08-22: qty 4
  Filled 2016-08-22: qty 11
  Filled 2016-08-22 (×2): qty 7
  Filled 2016-08-22: qty 12
  Filled 2016-08-22 (×3): qty 7
  Filled 2016-08-22 (×2): qty 11
  Filled 2016-08-22: qty 4

## 2016-08-22 MED ORDER — ACETAMINOPHEN 325 MG PO TABS
650.0000 mg | ORAL_TABLET | ORAL | Status: DC | PRN
  Administered 2016-08-22 – 2016-08-24 (×3): 650 mg via ORAL
  Filled 2016-08-22 (×3): qty 2

## 2016-08-22 MED ORDER — SODIUM CHLORIDE 0.9 % IV SOLN
INTRAVENOUS | Status: DC
  Administered 2016-08-22: 02:00:00 via INTRAVENOUS

## 2016-08-22 MED ORDER — STROKE: EARLY STAGES OF RECOVERY BOOK
Freq: Once | Status: AC
  Administered 2016-08-22: 02:00:00

## 2016-08-22 MED ORDER — ACETAMINOPHEN 650 MG RE SUPP: 650.0000 mg | RECTAL | Status: DC | PRN

## 2016-08-22 MED ORDER — ENOXAPARIN SODIUM 40 MG/0.4ML ~~LOC~~ SOLN
40.0000 mg | SUBCUTANEOUS | Status: DC
  Administered 2016-08-22: 02:00:00 40 mg via SUBCUTANEOUS
  Filled 2016-08-22: qty 0.4

## 2016-08-22 MED ORDER — ASPIRIN 300 MG RE SUPP: 300.0000 mg | Freq: Every day | RECTAL | Status: DC

## 2016-08-22 MED ORDER — APIXABAN 5 MG PO TABS: 5.0000 mg | ORAL_TABLET | Freq: Two times a day (BID) | ORAL | Status: DC

## 2016-08-22 MED ORDER — SENNOSIDES-DOCUSATE SODIUM 8.6-50 MG PO TABS: 1.0000 | ORAL_TABLET | Freq: Every evening | ORAL | Status: DC | PRN

## 2016-08-22 MED ORDER — ATORVASTATIN CALCIUM 20 MG PO TABS
40.0000 mg | ORAL_TABLET | Freq: Every day | ORAL | Status: DC
  Administered 2016-08-22 – 2016-08-24 (×3): 40 mg via ORAL
  Filled 2016-08-22 (×3): qty 2

## 2016-08-22 MED ORDER — INSULIN ASPART 100 UNIT/ML ~~LOC~~ SOLN: 0.0000 [IU] | SUBCUTANEOUS | Status: DC

## 2016-08-22 MED ORDER — MOMETASONE FURO-FORMOTEROL FUM 100-5 MCG/ACT IN AERO
2.0000 | INHALATION_SPRAY | Freq: Two times a day (BID) | RESPIRATORY_TRACT | Status: DC
  Administered 2016-08-22 – 2016-08-25 (×8): 2 via RESPIRATORY_TRACT
  Filled 2016-08-22: qty 8.8

## 2016-08-22 MED ORDER — GABAPENTIN 100 MG PO CAPS: 100.0000 mg | ORAL_CAPSULE | Freq: Two times a day (BID) | ORAL | Status: DC | PRN

## 2016-08-22 MED ORDER — ALUM & MAG HYDROXIDE-SIMETH 200-200-20 MG/5ML PO SUSP: 15.0000 mL | Freq: Four times a day (QID) | ORAL | Status: DC | PRN

## 2016-08-22 MED ORDER — NICOTINE 21 MG/24HR TD PT24
21.0000 mg | MEDICATED_PATCH | Freq: Every day | TRANSDERMAL | Status: DC
  Administered 2016-08-22 – 2016-08-25 (×5): 21 mg via TRANSDERMAL
  Filled 2016-08-22 (×5): qty 1

## 2016-08-22 MED ORDER — ASPIRIN 81 MG PO CHEW
81.0000 mg | CHEWABLE_TABLET | Freq: Every day | ORAL | Status: DC
  Administered 2016-08-23 – 2016-08-25 (×3): 81 mg via ORAL
  Filled 2016-08-22 (×4): qty 1

## 2016-08-22 MED ORDER — ACETAMINOPHEN 160 MG/5ML PO SOLN: 650.0000 mg | ORAL | Status: DC | PRN

## 2016-08-22 NOTE — Evaluation (Addendum)
Clinical/Bedside Swallow Evaluation Patient Details  Name: JLEN DEBOCK MRN: [id number] Date of Birth: August 13, 1955  Today's Date: 08/22/2016 Time: SLP Start Time (ACUTE ONLY): 1500 SLP Stop Time (ACUTE ONLY): 1600 SLP Time Calculation (min) (ACUTE ONLY): 60 min  Past Medical History:  Past Medical History:  Diagnosis Date  . Blind right eye   . COPD (chronic obstructive pulmonary disease) ([place])   . Diabetes mellitus without complication ([place])   . Hypertension    Past Surgical History: History reviewed. No pertinent surgical history. HPI:  Pt is an 61 y.o. male male with a known history of COPD, DM, HTN, and CVA(per pt report) w/ blindness in R eye post who presents to the emergency department via ambulance secondary to left-sided weakness. Patient's family reportedly checked on the patient after they did not see him on social media for about 10 hours. Patient states his symptoms started around 3 PM yesterda, about 5-6 hours prior to arrival in the emergency department although he was by himself. He reports sudden onset of left-sided weakness associated with the fall. His symptoms have persisted. Per report, pt had stopped taking his Eloquis(ran out).  Pt is alert, awake but often keeps eyes closed. Speech c/b min+ Dysarthria; noted min+ Left Negect. Noted MRI results of acute infarct at the right parietal and temporal lobes, and right basal ganglia - min fidgity in bed; restless; Left neglect. Pt stated he has "acid reflux".   Assessment / Plan / Recommendation Clinical Impression  Pt appears to present w/ grossly adequate oropharyngeal phase swallow function w/ no overt s/s of aspiration w/ po trials given when following general aspiration precautions and encouraged to follow a few strategies to include lingual sweeping to clear Left cheek/sulci areas. Pt consumed trials of thin liquids via CUP using single, small sips - slowly; and trials of puree/soft solids. No overt s/s of aspiration noted w/  trials. Oral phase c/b min+ orolabial weakness and decreased sensation requiring pt to utilize lingual sweeps to confirm clearing on Left side; and clearing of any labial residue in Left corner. Pt was able to feed self w/ full support - pt is blind in his Right eye, Left neglect present currently. Pt is also Left hand dominent but can use his Right had adequately. Recommend a mech soft diet consistency(Dysphagia level 3) w/ thin liquids; general aspiration precautions; strategies(posted); Pills in puree - Whole. Pt is able to follow instructions and answer basic y/n questions but d/t R MCA, pt would benefit from further Cognitive assessment; Dysarthria assessment. Family members updated and agreed.  SLP Visit Diagnosis: Dysphagia, oropharyngeal phase (R13.12)    Aspiration Risk  Mild aspiration risk (but reduced following general aspiration precautions)    Diet Recommendation  Dysphagia level 3 (for easier mastication d/t lacking dentition - baseline); Thin liquids. General aspiration and REFLUX precautions; assistance at meals d/t Left sided weakness and Neglect; R eye blindness.  Medication Administration: Whole meds with puree    Other  Recommendations Recommended Consults:  (Dietician f/u) Oral Care Recommendations: Oral care BID;Staff/trained caregiver to provide oral care   Follow up Recommendations Skilled Nursing facility;Inpatient Rehab (TBD)      Frequency and Duration min 3x week  2 weeks       Prognosis Prognosis for Safe Diet Advancement: Good Barriers to Reach Goals: Cognitive deficits;Behavior Barriers/Prognosis Comment: redirection; repetition      Swallow Study   General Date of Onset: 08/21/16 HPI: Pt is an 61 y.o. male male with a known history  of COPD, DM, HTN, and CVA(per pt report) w/ blindness in R eye post who presents to the emergency department via ambulance secondary to left-sided weakness. Patient's family reportedly checked on the patient after they did not  see him on social media for about 10 hours. Patient states his symptoms started around 3 PM yesterda, about 5-6 hours prior to arrival in the emergency department although he was by himself. He reports sudden onset of left-sided weakness associated with the fall. His symptoms have persisted. Per report, pt had stopped taking his Eloquis(ran out).  Pt is alert, awake but often keeps eyes closed. Speech c/b min+ Dysarthria; noted min+ Left Negect. Noted MRI results of acute infarct at the right parietal and temporal lobes, and right basal ganglia - min fidgity in bed; restless; Left neglect. Pt stated he has "acid reflux". Type of Study: Bedside Swallow Evaluation Previous Swallow Assessment: none reported Diet Prior to this Study: Regular;Thin liquids Temperature Spikes Noted: No (wbc 17.5) Respiratory Status: Room air History of Recent Intubation: No Behavior/Cognition: Alert;Cooperative;Pleasant mood;Distractible;Requires cueing (eyes closed often) Oral Cavity Assessment: Dry (sticky) Oral Care Completed by SLP: Recent completion by staff Oral Cavity - Dentition: Missing dentition (most missing; few front lower teeth) Vision: Impaired for self-feeding (blind in R eye; min+ Left neglect) Self-Feeding Abilities: Able to feed self;Needs assist;Needs set up;Total assist Patient Positioning: Upright in bed Baseline Vocal Quality: Normal (mumbled intermittently) Volitional Cough: Strong Volitional Swallow: Able to elicit    Oral/Motor/Sensory Function Overall Oral Motor/Sensory Function: Mild impairment (-Moderate impairment) Facial ROM: Reduced left (labial) Facial Symmetry: Abnormal symmetry left (labial) Facial Strength: Reduced left (labial) Facial Sensation: Reduced left Lingual ROM: Reduced left (min) Lingual Symmetry: Abnormal symmetry left (min) Lingual Strength: Reduced;Suspected CN XII (hypoglossal) dysfunction (mild) Lingual Sensation: Within Functional Limits (grossly) Velum: Within  Functional Limits (grossly) Mandible: Within Functional Limits   Ice Chips Ice chips: Within functional limits Presentation: Spoon (fed; 3 trials)   Thin Liquid Thin Liquid: Within functional limits Presentation: Cup;Self Fed (10 trials slowly) Other Comments: single, small sips - slowly    Nectar Thick Nectar Thick Liquid: Not tested   Honey Thick Honey Thick Liquid: Not tested   Puree Puree: Impaired Presentation: Spoon;Self Fed (assisted; 8 trials) Oral Phase Impairments: Poor awareness of bolus (min) Oral Phase Functional Implications: Left anterior spillage (min) Pharyngeal Phase Impairments:  (none) Other Comments: instructed pt on lingual sweeping every few bites/sips; wipe mouth intermittently for any labial residue   Solid   GO   Solid: Within functional limits (mech soft consistency; moistened) Presentation: Spoon;Self Fed (assisted; 4 trials)         Orinda Kenner, MS, CCC-SLP Watson,Katherine 08/22/2016,4:08 PM

## 2016-08-22 NOTE — ED Notes (Signed)
Called 1C to give report at [place]; told RNs involved in a Rapid Response, will call back.

## 2016-08-22 NOTE — Progress Notes (Signed)
   08/22/16 0800  PT Visit Information  Last PT Received On 08/22/16  Reason Eval/Treat Not Completed Patient at procedure or test/unavailable (awaiting MRI results; CT scan shows evolving infarct; Getting MRI this AM; will re-attempt PT when patient is medically stable and appropriate. )  Thank you for this referral. Hillis Range, PT, DPT 08/22/16 12:14 PM

## 2016-08-22 NOTE — Plan of Care (Signed)
Problem: Education: Goal: Knowledge of disease or condition will improve Outcome: Not Progressing Patient unable to do teachback. He's been very lethargic this shift, unable to focus or concentrate.   Goal: Knowledge of secondary prevention will improve Outcome: Not Progressing Patient not able to be instructed currently.    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  Will need significant help on d/c.

## 2016-08-22 NOTE — ED Notes (Signed)
Pt transport to 112

## 2016-08-22 NOTE — ED Notes (Signed)
Call placed to 1C to give report at [phone]; told RN busy at the time, will call back.

## 2016-08-22 NOTE — Progress Notes (Signed)
61 y/o M with a h/o DVT 11/17 on apixaban admitted for CVA. Apixaban 10 mg bid ordered which is the initial loading dose for 1 week which the patient has completed. Will adjust dosing of apixaban to 5 mg bid.   Ulice Dash, PharmD Clinical Pharmacist

## 2016-08-22 NOTE — Consult Note (Signed)
Reason for Consult:L side weakness and fall  Referring Physician: Dr. Posey Pronto  CC: L side weakness and fall   HPI: Chad Combs is an 61 y.o. male male with a known history of COPD, DM, HTN Presents to the emergency department via ambulance secondary to left-sided weakness. Patient's family reportedly checked on the patient after they did not see him on social media for about 10 hours. Patient states his symptoms started around 3 PM yesterda, about 5-6 hours prior to arrival in the emergency department although he was by himself. He reports sudden onset of left-sided weakness associated with the fall. His symptoms have persisted.  Pt was supposed to be on Eliquis for LE DVT but stateshe ran out of it.    LKW at least 24 hrs prior to arrival Not TPA or thrombectomy candidate as outside of window and CT already shows ischemia  Past Medical History:  Diagnosis Date  . Blind right eye   . COPD (chronic obstructive pulmonary disease) ([place])   . Diabetes mellitus without complication ([place])   . Hypertension     History reviewed. No pertinent surgical history.  Family History  Problem Relation Age of Onset  . Cancer Mother   . Heart disease Father     Social History:  reports that he has been smoking Cigarettes.  He has a 67.50 pack-year smoking history. He has never used smokeless tobacco. He reports that he drinks alcohol. He reports that he does not use drugs.  No Known Allergies  Medications: I have reviewed the patient's current medications.  ROS: History obtained from the patient  General ROS: negative for - chills, fatigue, fever, night sweats, weight gain or weight loss Psychological ROS: negative for - behavioral disorder, hallucinations, memory difficulties, mood swings or suicidal ideation Ophthalmic ROS: negative for - blurry vision, double vision, eye pain or loss of vision ENT ROS: negative for - epistaxis, nasal discharge, oral lesions, sore throat, tinnitus or  vertigo Allergy and Immunology ROS: negative for - hives or itchy/watery eyes Hematological and Lymphatic ROS: negative for - bleeding problems, bruising or swollen lymph nodes Endocrine ROS: negative for - galactorrhea, hair pattern changes, polydipsia/polyuria or temperature intolerance Respiratory ROS: negative for - cough, hemoptysis, shortness of breath or wheezing Cardiovascular ROS: negative for - chest pain, dyspnea on exertion, edema or irregular heartbeat Gastrointestinal ROS: negative for - abdominal pain, diarrhea, hematemesis, nausea/vomiting or stool incontinence Genito-Urinary ROS: negative for - dysuria, hematuria, incontinence or urinary frequency/urgency Musculoskeletal ROS: negative for - joint swelling or muscular weakness Neurological ROS: as noted in HPI Dermatological ROS: negative for rash and skin lesion changes  Physical Examination: Blood pressure (!) 146/78, pulse (!) 103, temperature 98.7 F (37.1 C), temperature source Oral, resp. rate 20, height 5\' 9"  (1.753 m), weight 94.1 kg (207 lb 8 oz), SpO2 94 %.    Neurological Examination   Mental Status: Alert, oriented, thought content appropriate.  Speech fluent without evidence of aphasia.  Able to follow 3 step commands without difficulty. Cranial Nerves: II: Discs flat bilaterally; Visual fields grossly normal, pupils equal, round, reactive to light and accommodation III,IV, VI: ptosis not present, extra-ocular motions intact bilaterally V,VII: smile symmetric, facial light touch sensation normal bilaterally VIII: hearing normal bilaterally IX,X: gag reflex present XI: bilateral shoulder shrug XII: midline tongue extension Motor: Right : Upper extremity   5/5    Left:     Upper extremity   0/5  Lower extremity   5/5     Lower  extremity   2/5 Tone and bulk:normal tone throughout; no atrophy noted  Neglect R side  Sensory: decreased L side and neglect Deep Tendon Reflexes: + and symmetric  throughout Plantars: Right: downgoing   Left: downgoing Cerebellar: Not tested on L side  Gait: normal gait and station      Laboratory Studies:   Basic Metabolic Panel:  Recent Labs Lab 08/21/16 2056 08/22/16 0147  NA 134* 138  K 5.7* 4.1  CL 100* 103  CO2 22 24  GLUCOSE 305* 271*  BUN 20 19  CREATININE 1.05 0.95  CALCIUM 9.7 9.3    Liver Function Tests:  Recent Labs Lab 08/21/16 2056 08/22/16 0147  AST 59* 47*  ALT 120* 98*  ALKPHOS 76 65  BILITOT 1.2 1.2  PROT 8.4* 7.7  ALBUMIN 4.6 4.0   No results for input(s): LIPASE, AMYLASE in the last 168 hours. No results for input(s): AMMONIA in the last 168 hours.  CBC:  Recent Labs Lab 08/21/16 2056 08/22/16 0147  WBC 17.9* 17.5*  NEUTROABS 15.7* 13.6*  HGB 20.6* 19.0*  HCT 58.9* 54.4*  MCV 95.3 95.8  PLT 160 161    Cardiac Enzymes:  Recent Labs Lab 08/21/16 2056 08/22/16 0147 08/22/16 0747  TROPONINI <0.03 <0.03 0.05*    BNP: Invalid input(s): POCBNP  CBG:  Recent Labs Lab 08/21/16 2212 08/22/16 0746  GLUCAP 314* 302*    Microbiology: No results found for this or any previous visit.  Coagulation Studies:  Recent Labs  08/21/16 2056  LABPROT 13.2  INR 1.00    Urinalysis:  Recent Labs Lab 08/22/16 0359  COLORURINE YELLOW*  LABSPEC 1.029  PHURINE 5.0  GLUCOSEU >=500*  HGBUR LARGE*  BILIRUBINUR NEGATIVE  KETONESUR 20*  PROTEINUR 100*  NITRITE NEGATIVE  LEUKOCYTESUR NEGATIVE    Lipid Panel:     Component Value Date/Time   CHOL 193 08/22/2016 0747   TRIG 441 (H) 08/22/2016 0747   HDL 35 (L) 08/22/2016 0747   CHOLHDL 5.5 08/22/2016 0747   VLDL UNABLE TO CALCULATE IF TRIGLYCERIDE OVER 400 mg/dL 08/22/2016 0747   LDLCALC UNABLE TO CALCULATE IF TRIGLYCERIDE OVER 400 mg/dL 08/22/2016 0747    HgbA1C:  Lab Results  Component Value Date   HGBA1C 9.5 07/04/2015    Urine Drug Screen:     Component Value Date/Time   LABOPIA NONE DETECTED 08/22/2016 0359    COCAINSCRNUR NONE DETECTED 08/22/2016 0359   LABBENZ NONE DETECTED 08/22/2016 0359   AMPHETMU NONE DETECTED 08/22/2016 0359   THCU NONE DETECTED 08/22/2016 0359   LABBARB NONE DETECTED 08/22/2016 0359    Alcohol Level: No results for input(s): ETH in the last 168 hours.  Other results: EKG: normal EKG, normal sinus rhythm, unchanged from previous tracings.  Imaging: Ct Head Wo Contrast  Result Date: 08/21/2016 CLINICAL DATA:  Patient found down. Unable to move left arm or leg, acute onset. Initial encounter. EXAM: CT HEAD WITHOUT CONTRAST TECHNIQUE: Contiguous axial images were obtained from the base of the skull through the vertex without intravenous contrast. COMPARISON:  None. FINDINGS: Brain: There is an evolving acute infarct at the right parietal and temporal lobes, and right basal ganglia. There is mild mass-effect on the frontal horn of the right lateral ventricle. No midline shift is seen. There is no evidence of hemorrhagic transformation at this time. There is no evidence of hydrocephalus or extra-axial collection. No mass lesion is seen. The posterior fossa, including the cerebellum, brainstem and fourth ventricle, is within normal limits. The third  and lateral ventricles are unremarkable in appearance. Vascular: No hyperdense vessel or unexpected calcification. Skull: There appears to be a minimally displaced fracture through the distal tip of the nasal bone, of indeterminate age. Sinuses/Orbits: The orbits are within normal limits. The paranasal sinuses and mastoid air cells are well-aerated. Other: No significant soft tissue abnormalities are seen. IMPRESSION: 1. Evolving acute infarct at the right parietal and temporal lobes, and right basal ganglia. Mild mass-effect on the frontal horn of the right lateral ventricle. No midline shift seen. No evidence of hemorrhagic transformation at this time. 2. Minimally displaced fracture through the distal tip of the nasal bone, of indeterminate  age. These results were called by telephone at the time of interpretation on 08/21/2016 at 9:47 pm to Dr. Nance Pear , who verbally acknowledged these results. Electronically Signed   By: Garald Balding M.D.   On: 08/21/2016 21:48     Assessment/Plan:  61 y.o. male male with a known history of COPD, DM, HTN Presents to the emergency department via ambulance secondary to left-sided weakness. Patient's family reportedly checked on the patient after they did not see him on social media for about 10 hours. Patient states his symptoms started around 3 PM yesterda, about 5-6 hours prior to arrival in the emergency department although he was by himself. He reports sudden onset of left-sided weakness associated with the fall. His symptoms have persisted.  CTH: Evolving acute infarct at the right parietal and temporal lobes, and right basal ganglia.  MRI similar territory stroke Pt was supposed to be on Eliquis for LE DVT but stateshe ran out of it.    - Will need significant rehab - he is neglecting L side - awaiting MRA - start ASA daily /statin - pt was on eliquis for DVT in LLE but ran out of it as he states.  Would not restart for at least 7 days due to risk of hemorrhagic conversion and significant size of R MCA stroke.   08/22/2016, 12:27 PM

## 2016-08-22 NOTE — Progress Notes (Addendum)
[facility]                                                                                                                                                                                  Patient Demographics   Chad Combs, is a 61 y.o. male, DOB - 1955/11/14, UY:[id number]  Admit date - 08/21/2016   Admitting Physician Harvie Bridge, DO  Outpatient Primary MD for the patient is [facility]   LOS - 1  Subjective: Patient unable to move his left upper extremity and left lower extremity Previous history of CVA    Review of Systems:   CONSTITUTIONAL: No documented fever. No fatigue, weakness. No weight gain, no weight loss.  EYES: No blurry or double vision.  ENT: No tinnitus. No postnasal drip. No redness of the oropharynx.  RESPIRATORY: No cough, no wheeze, no hemoptysis. chronic dyspnea.  CARDIOVASCULAR: No chest pain. No orthopnea. No palpitations. No syncope.  GASTROINTESTINAL: No nausea, no vomiting or diarrhea. No abdominal pain. No melena or hematochezia.  GENITOURINARY: No dysuria or hematuria.  ENDOCRINE: No polyuria or nocturia. No heat or cold intolerance.  HEMATOLOGY: No anemia. No bruising. No bleeding.  INTEGUMENTARY: No rashes. No lesions.  MUSCULOSKELETAL: No arthritis. No swelling. No gout.  NEUROLOGIC: left upper ext and left lower ext weakness PSYCHIATRIC: No anxiety. No insomnia. No ADD.    Vitals:   Vitals:   08/22/16 0553 08/22/16 0753 08/22/16 0834 08/22/16 1008  BP: 134/68 (!) 146/86 131/69 (!) 146/78  Pulse: (!) 102 (!) 105 (!) 107 (!) 103  Resp: 20     Temp: 97.9 F (36.6 C) 98.5 F (36.9 C)  98.7 F (37.1 C)  TempSrc: Oral Oral  Oral  SpO2: 94% 93%  94%  Weight:      Height:        Wt Readings from Last 3 Encounters:  08/22/16 207 lb 8 oz (94.1 kg)  05/04/16 269 lb (122 kg)  07/04/15 221 lb (100.2 kg)     Intake/Output Summary (Last 24 hours) at 08/22/16 1123 Last data  filed at 08/22/16 1043  Gross per 24 hour  Intake           333.75 ml  Output              775 ml  Net          -441.25 ml    Physical Exam:   GENERAL: Pleasant-appearing in no apparent distress.  HEAD, EYES, EARS, NOSE AND THROAT: Atraumatic, normocephalic. Extraocular muscles are intact. Pupils equal and reactive to light. Sclerae anicteric. No conjunctival injection. No oro-pharyngeal erythema.  NECK: Supple. There  is no jugular venous distention. No bruits, no lymphadenopathy, no thyromegaly.  HEART: Regular rate and rhythm,. No murmurs, no rubs, no clicks.  LUNGS: Clear to auscultation bilaterally. No rales or rhonchi. No wheezes.  ABDOMEN: Soft, flat, nontender, nondistended. Has good bowel sounds. No hepatosplenomegaly appreciated.  EXTREMITIES: No evidence of any cyanosis, clubbing, or peripheral edema.  +2 pedal and radial pulses bilaterally.  NEUROLOGIC: The patient is alert, awake, and oriented x3 LUE 0/5, LLE 1/3, sensation decreased in lower ext and left upper ext SKIN: Moist and warm with no rashes appreciated.  Psych: Not anxious, depressed LN: No inguinal LN enlargement    Antibiotics   Anti-infectives    None      Medications   Scheduled Meds: . aspirin  300 mg Rectal Daily   Or  . aspirin  325 mg Oral Daily  . enoxaparin (LOVENOX) injection  40 mg Subcutaneous Q24H  . insulin aspart  0-20 Units Subcutaneous TID WC & HS  . lisinopril  20 mg Oral Daily  . mometasone-formoterol  2 puff Inhalation BID  . nicotine  21 mg Transdermal Daily   Continuous Infusions: . sodium chloride 75 mL/hr at 08/22/16 0138   PRN Meds:.acetaminophen **OR** acetaminophen (TYLENOL) oral liquid 160 mg/5 mL **OR** acetaminophen, gabapentin, senna-docusate   Data Review:   Micro Results No results found for this or any previous visit (from the past 240 hour(s)).  Radiology Reports Ct Head Wo Contrast  Result Date: 08/21/2016 CLINICAL DATA:  Patient found down. Unable to  move left arm or leg, acute onset. Initial encounter. EXAM: CT HEAD WITHOUT CONTRAST TECHNIQUE: Contiguous axial images were obtained from the base of the skull through the vertex without intravenous contrast. COMPARISON:  None. FINDINGS: Brain: There is an evolving acute infarct at the right parietal and temporal lobes, and right basal ganglia. There is mild mass-effect on the frontal horn of the right lateral ventricle. No midline shift is seen. There is no evidence of hemorrhagic transformation at this time. There is no evidence of hydrocephalus or extra-axial collection. No mass lesion is seen. The posterior fossa, including the cerebellum, brainstem and fourth ventricle, is within normal limits. The third and lateral ventricles are unremarkable in appearance. Vascular: No hyperdense vessel or unexpected calcification. Skull: There appears to be a minimally displaced fracture through the distal tip of the nasal bone, of indeterminate age. Sinuses/Orbits: The orbits are within normal limits. The paranasal sinuses and mastoid air cells are well-aerated. Other: No significant soft tissue abnormalities are seen. IMPRESSION: 1. Evolving acute infarct at the right parietal and temporal lobes, and right basal ganglia. Mild mass-effect on the frontal horn of the right lateral ventricle. No midline shift seen. No evidence of hemorrhagic transformation at this time. 2. Minimally displaced fracture through the distal tip of the nasal bone, of indeterminate age. These results were called by telephone at the time of interpretation on 08/21/2016 at 9:47 pm to Dr. Nance Pear , who verbally acknowledged these results. Electronically Signed   By: Garald Balding M.D.   On: 08/21/2016 21:48     CBC  Recent Labs Lab 08/21/16 2056 08/22/16 0147  WBC 17.9* 17.5*  HGB 20.6* 19.0*  HCT 58.9* 54.4*  PLT 160 161  MCV 95.3 95.8  MCH 33.3 33.5  MCHC 34.9 34.9  RDW 13.1 12.8  LYMPHSABS 1.4 2.7  MONOABS 0.7 0.9  EOSABS  0.0 0.1  BASOSABS 0.2* 0.2*    Chemistries   Recent Labs Lab 08/21/16 2056 08/22/16  0147  NA 134* 138  K 5.7* 4.1  CL 100* 103  CO2 22 24  GLUCOSE 305* 271*  BUN 20 19  CREATININE 1.05 0.95  CALCIUM 9.7 9.3  AST 59* 47*  ALT 120* 98*  ALKPHOS 76 65  BILITOT 1.2 1.2   ------------------------------------------------------------------------------------------------------------------ estimated creatinine clearance is 92.5 mL/min (by C-G formula based on SCr of 0.95 mg/dL). ------------------------------------------------------------------------------------------------------------------ No results for input(s): HGBA1C in the last 72 hours. ------------------------------------------------------------------------------------------------------------------  Recent Labs  08/22/16 0747  CHOL 193  HDL 35*  LDLCALC UNABLE TO CALCULATE IF TRIGLYCERIDE OVER 400 mg/dL  TRIG 441*  CHOLHDL 5.5   ------------------------------------------------------------------------------------------------------------------ No results for input(s): TSH, T4TOTAL, T3FREE, THYROIDAB in the last 72 hours.  Invalid input(s): FREET3 ------------------------------------------------------------------------------------------------------------------ No results for input(s): VITAMINB12, FOLATE, FERRITIN, TIBC, IRON, RETICCTPCT in the last 72 hours.  Coagulation profile  Recent Labs Lab 08/21/16 2056  INR 1.00    No results for input(s): DDIMER in the last 72 hours.  Cardiac Enzymes  Recent Labs Lab 08/21/16 2056 08/22/16 0147 08/22/16 0747  TROPONINI <0.03 <0.03 0.05*   ------------------------------------------------------------------------------------------------------------------ Invalid input(s): POCBNP    Assessment & Plan   This is a 61 y.o. male with a history of COPD, DM, HTN now being admitted with:  1. CVA - acute infarct at the right parietal and temporal lobes, and right  basal ganglia - Await MRI and MRA of the brain -Continue therapy with aspirin - Continue eliqusi which pt on for dvt -treat hypertriglyceridemia   2. Hyperkalemia Now resolved continue to monitor  3. Leukocytosis / Erythrocytosis, no evidence of active infection - Obtain a chest x-ray and urinalysis was nonrevealing May need hematology evaluation   4. Elevated LFTs, mild - Monitor CMP in AM Likely due  to fatty liver  5. H/o COPD - Continue Advair and I'll add  6. H/O DM with hyperglycemia - Accuchecks achs with RISS coverage - Continue neurontin - Hold metformin - Blood sugars elevated I'll start patient on Lantus  7. H/o HTN - Continue Lisinopril with hold parameters   8. nicotine abuse smoking cessation provided for many spent patient strongly recommended to stop smoking   Code Status: Full  All the records are reviewed and case discussed with ED provider. Management plans discussed with the patient and/or family who express understanding and agree with plan of care.   TOTAL TIME TAKING CARE OF THIS PATIENT: 26minutes.   Alexis Hugelmeyer D.O. on 08/21/2016 at 11:08 PM Between 7am to 6pm - Pager - [phone] After 6pm go to [email or web address] - Proofreader [employer] [facility] [place] [facility] Office [phone] CC: Primary care physician; [facility]     Note: This dictation was prepared with Dragon dictation along with smaller phrase technology. Any transcriptional errors that result from this process are unintentional.     Code Status Orders        Start     Ordered   08/22/16 0111  Full code  Continuous     08/22/16 0110    Code Status History    Date Active Date Inactive Code Status Order ID Comments User Context   This patient has a current code status but no historical code status.           Consults   neurology  DVT Prophylaxis  eliquis  Lab Results  Component Value Date   PLT 161  08/22/2016     Time Spent in minutes   79min Greater than 50% of time spent  in care coordination and counseling patient regarding the condition and plan of care.   Dustin Flock M.D on 08/22/2016 at 11:23 AM  Between 7am to 6pm - Pager - [phone]  After 6pm go to [email or web address] - password [email or web address] [facility] [place] [facility]   Office  [phone]

## 2016-08-23 ENCOUNTER — Inpatient Hospital Stay: Payer: Self-pay

## 2016-08-23 DIAGNOSIS — I639 Cerebral infarction, unspecified: Secondary | ICD-10-CM

## 2016-08-23 LAB — HIV ANTIBODY (ROUTINE TESTING W REFLEX): HIV SCREEN 4TH GENERATION: NONREACTIVE

## 2016-08-23 LAB — TROPONIN I
TROPONIN I: 0.04 ng/mL — AB (ref <0.03)
Troponin I: <0.03 ng/mL (ref <0.03)

## 2016-08-23 LAB — GLUCOSE, CAPILLARY
GLUCOSE-CAPILLARY: 246 mg/dL — AB (ref 65–99)
GLUCOSE-CAPILLARY: 235 mg/dL — AB (ref 65–99)
Glucose-Capillary: 272 mg/dL — ABNORMAL HIGH (ref 65–99)
Glucose-Capillary: 270 mg/dL — ABNORMAL HIGH (ref 65–99)

## 2016-08-23 LAB — HEMOGLOBIN A1C
Hgb A1c MFr Bld: 10.5 % — ABNORMAL HIGH (ref 4.8–5.6)
MEAN PLASMA GLUCOSE: 255 mg/dL

## 2016-08-23 MED ORDER — CLOPIDOGREL BISULFATE 75 MG PO TABS
75.0000 mg | ORAL_TABLET | Freq: Every day | ORAL | Status: DC
  Administered 2016-08-23 – 2016-08-25 (×3): 75 mg via ORAL
  Filled 2016-08-23 (×4): qty 1

## 2016-08-23 MED ORDER — METFORMIN HCL 500 MG PO TABS
1000.0000 mg | ORAL_TABLET | Freq: Two times a day (BID) | ORAL | Status: DC
  Administered 2016-08-23 – 2016-08-25 (×5): 1000 mg via ORAL
  Filled 2016-08-23 (×5): qty 2

## 2016-08-23 NOTE — Care Management (Addendum)
Admitted to [facility] with the diagnosis of CVA. Lives alone. Son is [name] [phone]). Goes to [facility] for physician care. Seen by diabetic coordinator at the [facility] about a week ago. No home Health. No skilled facility. No home oxygen. Prescriptions are filled at [facility]. Takes care of all basic activities of daily living himself, drives. Cane available in the home, if needed. Good appetite. Fell last Saturday. Physical therapy evaluation completed. Recommends inpatient acute rehabilitation. Discussed this plan with Chad Combs. Would like to go to Inpatient Acute Rehabilitation. States his son could help him out after being discharged form Inpatient Acute Rehab., but couldn't live with him.  Received telephone call from son [name]. States he still works full-time. Would not be able to be in the home 24/7.   Will update [name] RN representative for [employer]. [name] RN MSN CCM Care Management

## 2016-08-23 NOTE — Clinical Social Work Note (Signed)
Clinical Social Work Assessment  Patient Details  Name: Chad Combs MRN: [id number] Date of Birth: Jan 23, 1956  Date of referral:  08/23/16               Reason for consult:  Facility Placement, Discharge Planning, Financial Concerns, Insurance Barriers                Permission sought to share information with:  Family Supports Permission granted to share information::  Yes, Verbal Permission Granted  Name::     Chad Combs  Relationship::  son  Contact Information:  [phone]  Housing/Transportation Living arrangements for the past 2 months:  [place] of Information:  Patient, Adult Children Patient Interpreter Needed:  None Criminal Activity/Legal Involvement Pertinent to Current Situation/Hospitalization:  No - Comment as needed Significant Relationships:  Adult Children Lives with:  Self Do you feel safe going back to the place where you live?  Yes Need for family participation in patient care:  Yes (Comment)  Care giving concerns:  Pt is in need of rehab prior to returning home.   Social Worker assessment / plan:  CSW met with pt and son to address consult for New SNF. CSW introduced herself and explained role of social work. CSW also explained the process of discharging to SNF. PT is currently recommending CIR. CSW offered SNF placement and explained the barriers (LOG and limited access to PT). Pt's son is involved and explained that pt has met with Development worker, community. Pt has also applied of disability, which has been denied, and in the process of appeals. Pt is currently living off of an IRA. CSW Chad need to staff with Asst. Director the possibility of an LOG for SNF placement. CSW Chad work pt up for SNF, however the preference is CIR.   CIR was unable to make a bed offer due to pt not having 24 hour supervision. CSW talked with pt about securing. Pt's son also inquired about [facility] inpatient rehab and charity care. CSW Chad continue to follow.   Employment  status:  Disabled (Comment on whether or not currently receiving Disability) (Disability is being appealed. ) Insurance information:  Self Pay (Medicaid Pending) PT Recommendations:  [place] / Referral to community resources:  [place]  Patient/Family's Response to care:  Pt and son were appreciative of CSW support.   Patient/Family's Understanding of and Emotional Response to Diagnosis, Current Treatment, and Prognosis:  Pt's son understands that pt would benefit from rehab, however would like to make sure that pt is able to manage when he returns home, in terms of finances.   Emotional Assessment Appearance:  Appears stated age Attitude/Demeanor/Rapport:   (Appropriate) Affect (typically observed):  Pleasant Orientation:  Oriented to Self, Oriented to Place, Oriented to  Time, Oriented to Situation Alcohol / Substance use:  Not Applicable Psych involvement (Current and /or in the community):  No (Comment)  Discharge Needs  Concerns to be addressed:  Adjustment to Illness Readmission within the last 30 days:  No Current discharge risk:  Chronically ill Barriers to Discharge:  Continued Medical Work up, Inadequate or no insurance   Darden Dates, LCSW 08/23/2016, 4:26 PM

## 2016-08-23 NOTE — Consult Note (Addendum)
Subjective: Patient continues to have left sided weakness.  No new neurological complaints.    Objective: Current vital signs: BP 139/83 (BP Location: Right Arm)   Pulse (!) 104   Temp 98.1 F (36.7 C) (Oral)   Resp 20   Ht 5\' 9"  (1.753 m)   Wt 94.1 kg (207 lb 8 oz) Comment: w/o SCD ON BED  SpO2 96%   BMI 30.64 kg/m  Vital signs in last 24 hours: Temp:  [97.6 F (36.4 C)-98.1 F (36.7 C)] 98.1 F (36.7 C) (03/05 1251) Pulse Rate:  [79-104] 104 (03/05 1251) Resp:  [20-23] 20 (03/05 1251) BP: (122-158)/(69-91) 139/83 (03/05 1251) SpO2:  [94 %-96 %] 96 % (03/05 1251)  Intake/Output from previous day: 03/04 0701 - 03/05 0700 In: 120 [P.O.:120] Out: 1025 [Urine:1025] Intake/Output this shift: Total I/O In: 120 [P.O.:120] Out: 450 [Urine:450] Nutritional status: DIET DYS 3 Room service appropriate? Yes with Assist; Fluid consistency: Thin  Neurologic Exam: Mental Status: Alert, oriented, thought content appropriate.  Speech fluent without evidence of aphasia.  Able to follow 3 step commands without difficulty. Cranial Nerves: II: Discs flat bilaterally; Blind in right eye III,IV, VI: ptosis not present, extra-ocular motions intact bilaterally V,VII: left facial droop VIII: hearing normal bilaterally IX,X: gag reflex present XI: bilateral shoulder shrug XII: midline tongue extension Motor: Right :  Upper extremity   5/5                                      Left:     Upper extremity   0/5             Lower extremity   5/5                                                  Lower extremity   0/5 Tone and bulk:normal tone throughout; no atrophy noted   Lab Results: Basic Metabolic Panel:  Recent Labs Lab 08/21/16 2056 08/22/16 0147  NA 134* 138  K 5.7* 4.1  CL 100* 103  CO2 22 24  GLUCOSE 305* 271*  BUN 20 19  CREATININE 1.05 0.95  CALCIUM 9.7 9.3    Liver Function Tests:  Recent Labs Lab 08/21/16 2056 08/22/16 0147  AST 59* 47*  ALT 120* 98*  ALKPHOS  76 65  BILITOT 1.2 1.2  PROT 8.4* 7.7  ALBUMIN 4.6 4.0   No results for input(s): LIPASE, AMYLASE in the last 168 hours. No results for input(s): AMMONIA in the last 168 hours.  CBC:  Recent Labs Lab 08/21/16 2056 08/22/16 0147  WBC 17.9* 17.5*  NEUTROABS 15.7* 13.6*  HGB 20.6* 19.0*  HCT 58.9* 54.4*  MCV 95.3 95.8  PLT 160 161    Cardiac Enzymes:  Recent Labs Lab 08/22/16 1254 08/22/16 1928 08/23/16 0048 08/23/16 0815 08/23/16 1318  TROPONINI <0.03 <0.03 0.04* <0.03 <0.03    Lipid Panel:  Recent Labs Lab 08/22/16 0747  CHOL 193  TRIG 441*  HDL 35*  CHOLHDL 5.5  VLDL UNABLE TO CALCULATE IF TRIGLYCERIDE OVER 400 mg/dL  LDLCALC UNABLE TO CALCULATE IF TRIGLYCERIDE OVER 400 mg/dL    CBG:  Recent Labs Lab 08/22/16 1304 08/22/16 1650 08/22/16 2054 08/23/16 0759 08/23/16 1210  GLUCAP 204* 182* 298* 246* 272*  Microbiology: No results found for this or any previous visit.  Coagulation Studies:  Recent Labs  08/21/16 2056  LABPROT 13.2  INR 1.00    Imaging: Dg Chest 1 View  Result Date: 08/23/2016 CLINICAL DATA:  Short of breath EXAM: CHEST 1 VIEW COMPARISON:  None. FINDINGS: Normal heart size. Lungs clear. No pneumothorax. No pleural effusion. IMPRESSION: No active disease. Electronically Signed   By: Marybelle Killings M.D.   On: 08/23/2016 07:44   Ct Head Wo Contrast  Result Date: 08/21/2016 CLINICAL DATA:  Patient found down. Unable to move left arm or leg, acute onset. Initial encounter. EXAM: CT HEAD WITHOUT CONTRAST TECHNIQUE: Contiguous axial images were obtained from the base of the skull through the vertex without intravenous contrast. COMPARISON:  None. FINDINGS: Brain: There is an evolving acute infarct at the right parietal and temporal lobes, and right basal ganglia. There is mild mass-effect on the frontal horn of the right lateral ventricle. No midline shift is seen. There is no evidence of hemorrhagic transformation at this time. There  is no evidence of hydrocephalus or extra-axial collection. No mass lesion is seen. The posterior fossa, including the cerebellum, brainstem and fourth ventricle, is within normal limits. The third and lateral ventricles are unremarkable in appearance. Vascular: No hyperdense vessel or unexpected calcification. Skull: There appears to be a minimally displaced fracture through the distal tip of the nasal bone, of indeterminate age. Sinuses/Orbits: The orbits are within normal limits. The paranasal sinuses and mastoid air cells are well-aerated. Other: No significant soft tissue abnormalities are seen. IMPRESSION: 1. Evolving acute infarct at the right parietal and temporal lobes, and right basal ganglia. Mild mass-effect on the frontal horn of the right lateral ventricle. No midline shift seen. No evidence of hemorrhagic transformation at this time. 2. Minimally displaced fracture through the distal tip of the nasal bone, of indeterminate age. These results were called by telephone at the time of interpretation on 08/21/2016 at 9:47 pm to Dr. Nance Pear , who verbally acknowledged these results. Electronically Signed   By: Garald Balding M.D.   On: 08/21/2016 21:48   Mr Brain Wo Contrast  Result Date: 08/22/2016 CLINICAL DATA:  Fall with left-sided weakness. Right MCA infarct on CT yesterday. EXAM: MRI HEAD WITHOUT CONTRAST MRA HEAD WITHOUT CONTRAST TECHNIQUE: Multiplanar, multiecho pulse sequences of the brain and surrounding structures were obtained without intravenous contrast. Angiographic images of the head were obtained using MRA technique without contrast. COMPARISON:  Head CT 08/21/2016 FINDINGS: MRI HEAD FINDINGS Brain: There is a large right MCA territory infarct diffusely involving the basal ganglia, adjacent deep white matter tracts, and portions of the temporal, frontal, and parietal lobes. The degree of diffusion restriction and the degree of cytotoxic edema in the infarct is in variable suggesting  ischemia of varying age, with a more early subacute appearance of the basal ganglia infarcts and a more acute appearance of much of the cortical/subcortical infarction. There is a small amount of associated petechial hemorrhage. Two punctate foci of acute cortical infarction are present more posteriorly near the temporo occipital junction. A chronic, small cortical and subcortical infarction is present in the high posterior right frontal lobe. There is no evidence mass, midline shift, or extra-axial fluid collection. There is effacement of the right frontal horn due to the basal ganglia edema. Vascular: Abnormal appearance of the right ICA and right MCA, more fully evaluated below. Skull and upper cervical spine: Unremarkable bone marrow signal. Sinuses/Orbits: Unremarkable orbits. Paranasal sinuses and mastoid  air cells are clear. Other: None. MRA HEAD FINDINGS The visualized distal vertebral arteries are patent with the left being dominant. The basilar artery is widely patent. Right AICA and bilateral SCA is are visualized. There is a small right posterior communicating artery. PCAs are patent without evidence of significant stenosis. Lack of flow related enhancement in the distal cervical and intracranial segments of the right ICA consistent with occlusion. There is reconstitution of the right ICA terminus via the posterior communicating artery. There is proximal right M1 MCA occlusion. The right A1 segment is patent though mildly hypoplastic in appearance diffusely. The intracranial left ICA is widely patent. The left ACA and left MCA are also patent without evidence of significant proximal stenosis. No intracranial aneurysm is identified. IMPRESSION: 1. Acute to early subacute large right MCA infarct. 2. 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 3. Small chronic right frontal infarct. These results were called by telephone at the time of interpretation on 08/22/2016 at 1:35 pm to Dr. Dustin Flock, who verbally acknowledged these results. Electronically Signed   By: Logan Bores M.D.   On: 08/22/2016 13:46   US Carotid Bilateral (at [facility] Only)  Result Date: 08/22/2016 CLINICAL DATA:  Cerebral infarction. EXAM: BILATERAL CAROTID DUPLEX ULTRASOUND TECHNIQUE: Pearline Cables scale imaging, color Doppler and duplex ultrasound were performed of bilateral carotid and vertebral arteries in the neck. COMPARISON:  None. FINDINGS: Criteria: Quantification of carotid stenosis is based on velocity parameters that correlate the residual internal carotid diameter with NASCET-based stenosis levels, using the diameter of the distal internal carotid lumen as the denominator for stenosis measurement. The following velocity measurements were obtained: RIGHT ICA:  Flow not detectable. CCA:  123456 cm/sec SYSTOLIC ICA/CCA RATIO:  N/A DIASTOLIC ICA/CCA RATIO:  N/A ECA:  225 cm/sec LEFT ICA:  43/145 cm/sec CCA:  [id number] cm/sec SYSTOLIC ICA/CCA RATIO:  4.5 DIASTOLIC ICA/CCA RATIO:  [id number] ECA:  310 cm/sec RIGHT CAROTID ARTERY: There is a prominent amount of partially calcified plaque at the level of the carotid bulb. Large amount of plaque and thrombus is identified in the internal carotid artery with no detectable color Doppler flow beyond the ICA origin. RIGHT VERTEBRAL ARTERY: Antegrade flow with normal waveform and velocity. LEFT CAROTID ARTERY: There is a significant amount of partially calcified plaque at the level of the carotid bulb extending into internal and external carotid arteries. Proximal left ICA waveforms show spectral broadening and significant velocity elevation consistent with a greater than 70% stenosis. LEFT VERTEBRAL ARTERY: Antegrade flow with normal waveform and velocity. IMPRESSION: 1. 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 2. Significant plaque at the level of the left carotid bulb and proximal ICA causing  significant proximal left ICA stenosis. Estimated stenosis is greater than 70%. Electronically Signed   By: Aletta Edouard M.D.   On: 08/22/2016 13:42   Mr Jodene Nam Head/brain [id number] Cm  Result Date: 08/22/2016 CLINICAL DATA:  Fall with left-sided weakness. Right MCA infarct on CT yesterday. EXAM: MRI HEAD WITHOUT CONTRAST MRA HEAD WITHOUT CONTRAST TECHNIQUE: Multiplanar, multiecho pulse sequences of the brain and surrounding structures were obtained without intravenous contrast. Angiographic images of the head were obtained using MRA technique without contrast. COMPARISON:  Head CT 08/21/2016 FINDINGS: MRI HEAD FINDINGS Brain: There is a large right MCA territory infarct diffusely involving the basal ganglia, adjacent deep white matter tracts, and portions of the temporal, frontal, and parietal  lobes. The degree of diffusion restriction and the degree of cytotoxic edema in the infarct is in variable suggesting ischemia of varying age, with a more early subacute appearance of the basal ganglia infarcts and a more acute appearance of much of the cortical/subcortical infarction. There is a small amount of associated petechial hemorrhage. Two punctate foci of acute cortical infarction are present more posteriorly near the temporo occipital junction. A chronic, small cortical and subcortical infarction is present in the high posterior right frontal lobe. There is no evidence mass, midline shift, or extra-axial fluid collection. There is effacement of the right frontal horn due to the basal ganglia edema. Vascular: Abnormal appearance of the right ICA and right MCA, more fully evaluated below. Skull and upper cervical spine: Unremarkable bone marrow signal. Sinuses/Orbits: Unremarkable orbits. Paranasal sinuses and mastoid air cells are clear. Other: None. MRA HEAD FINDINGS The visualized distal vertebral arteries are patent with the left being dominant. The basilar artery is widely patent. Right AICA and bilateral SCA is are  visualized. There is a small right posterior communicating artery. PCAs are patent without evidence of significant stenosis. Lack of flow related enhancement in the distal cervical and intracranial segments of the right ICA consistent with occlusion. There is reconstitution of the right ICA terminus via the posterior communicating artery. There is proximal right M1 MCA occlusion. The right A1 segment is patent though mildly hypoplastic in appearance diffusely. The intracranial left ICA is widely patent. The left ACA and left MCA are also patent without evidence of significant proximal stenosis. No intracranial aneurysm is identified. IMPRESSION: 1. Acute to early subacute large right MCA infarct. 2. 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 3. Small chronic right frontal infarct. These results were called by telephone at the time of interpretation on 08/22/2016 at 1:35 pm to Dr. Dustin Flock, who verbally acknowledged these results. Electronically Signed   By: Logan Bores M.D.   On: 08/22/2016 13:46    Medications:  I have reviewed the patient's current medications. Scheduled: . aspirin  81 mg Oral Daily  . atorvastatin  40 mg Oral q1800  . insulin aspart  0-20 Units Subcutaneous TID WC & HS  . lisinopril  20 mg Oral Daily  . metFORMIN  1,000 mg Oral BID WC  . mometasone-formoterol  2 puff Inhalation BID  . nicotine  21 mg Transdermal Daily    Assessment/Plan: Continued left sided weakness.  Patient was on Eliquis at home due to DVT.  Currently on ASA.  Has been noncompliant.  Likely noncompliant with other medications as well.  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  Echocardiogram shows no cardiac source of emboli with an EF of 60%.  A1c 10.5, LDL unable to be calculated.   Recommendations: 1.  Agree with statin use 2.  Agree with ASA.  Would start Plavix as well at 75mg  daily.   3.  Doppler to determine of DVT remains present.  If present will  need to look closely for possible cardiac shunt.   4.  Blood sugar management 5.  Patient to follow up with vascular for LICA stenosis.   6.  Continue therapy 7.  Smoking cessation counseling    LOS: 2 days   Alexis Goodell, MD Neurology [phone] 08/23/2016  2:19 PM

## 2016-08-23 NOTE — Progress Notes (Signed)
Dr Estanislado Pandy made aware in person of troponin 0.04, acknowledged, no new oerders

## 2016-08-23 NOTE — Progress Notes (Signed)
Inpatient Diabetes Program Recommendations  AACE/ADA: New Consensus Statement on Inpatient Glycemic Control (2015)  Target Ranges:  Prepandial:   less than 140 mg/dL      Peak postprandial:   less than 180 mg/dL (1-2 hours)      Critically ill patients:  140 - 180 mg/dL   Lab Results  Component Value Date   GLUCAP 272 (H) 08/23/2016   HGBA1C 10.5 (H) 08/22/2016    Review of Glycemic Control:  Results for DIOGENES, UDELHOFEN (MRN [id number]) as of 08/23/2016 12:37  Ref. Range 08/22/2016 07:46 08/22/2016 13:04 08/22/2016 16:50 08/22/2016 20:54 08/23/2016 07:59 08/23/2016 12:10  Glucose-Capillary Latest Ref Range: 65 - 99 mg/dL 302 (H) 204 (H) 182 (H) 298 (H) 246 (H) 272 (H)   Diabetes history: Type 2 diabetes Outpatient Diabetes medications: Metformin 500 mg bid Current orders for Inpatient glycemic control:  Novolog resistant tid with meals and HS, Metformin 1000 mg bid  Inpatient Diabetes Program Recommendations:   Note that A1C indicates average blood sugars of 255 mg/dL.  Please consider adding Levemir 20 units daily and Novolog 4 units tid with meals while in the hospital.  Based on A1C patient will likely need insulin post discharge as well.  Thanks, Adah Perl, RN, BC-ADM Inpatient Diabetes Coordinator Pager [phone] (8a-5p)

## 2016-08-23 NOTE — Progress Notes (Signed)
Chad Combs    MR#:  [id number]  DATE OF BIRTH:  [birth year]  SUBJECTIVE:   Did not get proper sleep. Remains with left sided complete hemiplegia Tolerating diet  REVIEW OF SYSTEMS:   Review of Systems  Constitutional: Negative for chills, fever and weight loss.  HENT: Negative for ear discharge, ear pain and nosebleeds.   Eyes: Negative for blurred vision, pain and discharge.  Respiratory: Negative for sputum production, shortness of breath, wheezing and stridor.   Cardiovascular: Negative for chest pain, palpitations, orthopnea and PND.  Gastrointestinal: Negative for abdominal pain, diarrhea, nausea and vomiting.  Genitourinary: Negative for frequency and urgency.  Musculoskeletal: Negative for back pain and joint pain.  Neurological: Positive for focal weakness and weakness. Negative for sensory change and speech change.  Psychiatric/Behavioral: Negative for depression and hallucinations. The patient is not nervous/anxious.    Tolerating Diet:yes Tolerating PT: pending  DRUG ALLERGIES:  No Known Allergies  VITALS:  Blood pressure (!) 153/82, pulse 79, temperature 97.6 F (36.4 C), temperature source Oral, resp. rate 20, height 5\' 9"  (1.753 m), weight 94.1 kg (207 lb 8 oz), SpO2 95 %.  PHYSICAL EXAMINATION:   Physical Exam  GENERAL:  61 y.o.-year-old patient lying in the bed with no acute distress.  EYES: Pupils equal, round, reactive to light and accommodation. No scleral icterus. Extraocular muscles intact.  HEENT: Head atraumatic, normocephalic. Oropharynx and nasopharynx clear.  NECK:  Supple, no jugular venous distention. No thyroid enlargement, no tenderness.  LUNGS: Normal breath sounds bilaterally, no wheezing, rales, rhonchi. No use of accessory muscles of respiration.  CARDIOVASCULAR: S1, S2 normal. No murmurs, rubs, or gallops.  ABDOMEN: Soft, nontender, nondistended. Bowel sounds  present. No organomegaly or mass.  EXTREMITIES: No cyanosis, clubbing or edema b/l.    NEUROLOGIC: dense left sided hemiplegia PSYCHIATRIC:  patient is alert and oriented x 3.  SKIN: No obvious rash, lesion, or ulcer.   LABORATORY PANEL:  CBC  Recent Labs Lab 08/22/16 0147  WBC 17.5*  HGB 19.0*  HCT 54.4*  PLT 161    Chemistries   Recent Labs Lab 08/22/16 0147  NA 138  K 4.1  CL 103  CO2 24  GLUCOSE 271*  BUN 19  CREATININE 0.95  CALCIUM 9.3  AST 47*  ALT 98*  ALKPHOS 65  BILITOT 1.2   Cardiac Enzymes  Recent Labs Lab 08/23/16 0048  TROPONINI 0.04*   RADIOLOGY:  Dg Chest 1 View  Result Date: 08/23/2016 CLINICAL DATA:  Short of breath EXAM: CHEST 1 VIEW COMPARISON:  None. FINDINGS: Normal heart size. Lungs clear. No pneumothorax. No pleural effusion. IMPRESSION: No active disease. Electronically Signed   By: Marybelle Killings M.D.   On: 08/23/2016 07:44   Ct Head Wo Contrast  Result Date: 08/21/2016 CLINICAL DATA:  Patient found down. Unable to move left arm or leg, acute onset. Initial encounter. EXAM: CT HEAD WITHOUT CONTRAST TECHNIQUE: Contiguous axial images were obtained from the base of the skull through the vertex without intravenous contrast. COMPARISON:  None. FINDINGS: Brain: There is an evolving acute infarct at the right parietal and temporal lobes, and right basal ganglia. There is mild mass-effect on the frontal horn of the right lateral ventricle. No midline shift is seen. There is no evidence of hemorrhagic transformation at this time. There is no evidence of hydrocephalus or extra-axial collection. No mass lesion is seen. The posterior fossa, including the cerebellum, brainstem and  fourth ventricle, is within normal limits. The third and lateral ventricles are unremarkable in appearance. Vascular: No hyperdense vessel or unexpected calcification. Skull: There appears to be a minimally displaced fracture through the distal tip of the nasal bone, of  indeterminate age. Sinuses/Orbits: The orbits are within normal limits. The paranasal sinuses and mastoid air cells are well-aerated. Other: No significant soft tissue abnormalities are seen. IMPRESSION: 1. Evolving acute infarct at the right parietal and temporal lobes, and right basal ganglia. Mild mass-effect on the frontal horn of the right lateral ventricle. No midline shift seen. No evidence of hemorrhagic transformation at this time. 2. Minimally displaced fracture through the distal tip of the nasal bone, of indeterminate age. These results were called by telephone at the time of interpretation on 08/21/2016 at 9:47 pm to Dr. Nance Pear , who verbally acknowledged these results. Electronically Signed   By: Garald Balding M.D.   On: 08/21/2016 21:48   Mr Brain Wo Contrast  Result Date: 08/22/2016 CLINICAL DATA:  Fall with left-sided weakness. Right MCA infarct on CT yesterday. EXAM: MRI HEAD WITHOUT CONTRAST MRA HEAD WITHOUT CONTRAST TECHNIQUE: Multiplanar, multiecho pulse sequences of the brain and surrounding structures were obtained without intravenous contrast. Angiographic images of the head were obtained using MRA technique without contrast. COMPARISON:  Head CT 08/21/2016 FINDINGS: MRI HEAD FINDINGS Brain: There is a large right MCA territory infarct diffusely involving the basal ganglia, adjacent deep white matter tracts, and portions of the temporal, frontal, and parietal lobes. The degree of diffusion restriction and the degree of cytotoxic edema in the infarct is in variable suggesting ischemia of varying age, with a more early subacute appearance of the basal ganglia infarcts and a more acute appearance of much of the cortical/subcortical infarction. There is a small amount of associated petechial hemorrhage. Two punctate foci of acute cortical infarction are present more posteriorly near the temporo occipital junction. A chronic, small cortical and subcortical infarction is present in the  high posterior right frontal lobe. There is no evidence mass, midline shift, or extra-axial fluid collection. There is effacement of the right frontal horn due to the basal ganglia edema. Vascular: Abnormal appearance of the right ICA and right MCA, more fully evaluated below. Skull and upper cervical spine: Unremarkable bone marrow signal. Sinuses/Orbits: Unremarkable orbits. Paranasal sinuses and mastoid air cells are clear. Other: None. MRA HEAD FINDINGS The visualized distal vertebral arteries are patent with the left being dominant. The basilar artery is widely patent. Right AICA and bilateral SCA is are visualized. There is a small right posterior communicating artery. PCAs are patent without evidence of significant stenosis. Lack of flow related enhancement in the distal cervical and intracranial segments of the right ICA consistent with occlusion. There is reconstitution of the right ICA terminus via the posterior communicating artery. There is proximal right M1 MCA occlusion. The right A1 segment is patent though mildly hypoplastic in appearance diffusely. The intracranial left ICA is widely patent. The left ACA and left MCA are also patent without evidence of significant proximal stenosis. No intracranial aneurysm is identified. IMPRESSION: 1. Acute to early subacute large right MCA infarct. 2. 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 3. Small chronic right frontal infarct. These results were called by telephone at the time of interpretation on 08/22/2016 at 1:35 pm to Dr. Dustin Flock, who verbally acknowledged these results. Electronically Signed   By: Logan Bores M.D.   On: 08/22/2016 13:46   US Carotid Bilateral (at  [facility] Only)  Result Date: 08/22/2016 CLINICAL DATA:  Cerebral infarction. EXAM: BILATERAL CAROTID DUPLEX ULTRASOUND TECHNIQUE: Pearline Cables scale imaging, color Doppler and duplex ultrasound were performed of bilateral carotid and vertebral arteries in the neck.  COMPARISON:  None. FINDINGS: Criteria: Quantification of carotid stenosis is based on velocity parameters that correlate the residual internal carotid diameter with NASCET-based stenosis levels, using the diameter of the distal internal carotid lumen as the denominator for stenosis measurement. The following velocity measurements were obtained: RIGHT ICA:  Flow not detectable. CCA:  123456 cm/sec SYSTOLIC ICA/CCA RATIO:  N/A DIASTOLIC ICA/CCA RATIO:  N/A ECA:  225 cm/sec LEFT ICA:  43/145 cm/sec CCA:  [id number] cm/sec SYSTOLIC ICA/CCA RATIO:  4.5 DIASTOLIC ICA/CCA RATIO:  [id number] ECA:  310 cm/sec RIGHT CAROTID ARTERY: There is a prominent amount of partially calcified plaque at the level of the carotid bulb. Large amount of plaque and thrombus is identified in the internal carotid artery with no detectable color Doppler flow beyond the ICA origin. RIGHT VERTEBRAL ARTERY: Antegrade flow with normal waveform and velocity. LEFT CAROTID ARTERY: There is a significant amount of partially calcified plaque at the level of the carotid bulb extending into internal and external carotid arteries. Proximal left ICA waveforms show spectral broadening and significant velocity elevation consistent with a greater than 70% stenosis. LEFT VERTEBRAL ARTERY: Antegrade flow with normal waveform and velocity. IMPRESSION: 1. 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 2. Significant plaque at the level of the left carotid bulb and proximal ICA causing significant proximal left ICA stenosis. Estimated stenosis is greater than 70%. Electronically Signed   By: Aletta Edouard M.D.   On: 08/22/2016 13:42   Mr Jodene Nam Head/brain [id number] Cm  Result Date: 08/22/2016 CLINICAL DATA:  Fall with left-sided weakness. Right MCA infarct on CT yesterday. EXAM: MRI HEAD WITHOUT CONTRAST MRA HEAD WITHOUT CONTRAST TECHNIQUE: Multiplanar, multiecho pulse sequences of the brain and  surrounding structures were obtained without intravenous contrast. Angiographic images of the head were obtained using MRA technique without contrast. COMPARISON:  Head CT 08/21/2016 FINDINGS: MRI HEAD FINDINGS Brain: There is a large right MCA territory infarct diffusely involving the basal ganglia, adjacent deep white matter tracts, and portions of the temporal, frontal, and parietal lobes. The degree of diffusion restriction and the degree of cytotoxic edema in the infarct is in variable suggesting ischemia of varying age, with a more early subacute appearance of the basal ganglia infarcts and a more acute appearance of much of the cortical/subcortical infarction. There is a small amount of associated petechial hemorrhage. Two punctate foci of acute cortical infarction are present more posteriorly near the temporo occipital junction. A chronic, small cortical and subcortical infarction is present in the high posterior right frontal lobe. There is no evidence mass, midline shift, or extra-axial fluid collection. There is effacement of the right frontal horn due to the basal ganglia edema. Vascular: Abnormal appearance of the right ICA and right MCA, more fully evaluated below. Skull and upper cervical spine: Unremarkable bone marrow signal. Sinuses/Orbits: Unremarkable orbits. Paranasal sinuses and mastoid air cells are clear. Other: None. MRA HEAD FINDINGS The visualized distal vertebral arteries are patent with the left being dominant. The basilar artery is widely patent. Right AICA and bilateral SCA is are visualized. There is a small right posterior communicating artery. PCAs are patent without evidence of significant stenosis. Lack of flow related enhancement in the distal cervical  and intracranial segments of the right ICA consistent with occlusion. There is reconstitution of the right ICA terminus via the posterior communicating artery. There is proximal right M1 MCA occlusion. The right A1 segment is  patent though mildly hypoplastic in appearance diffusely. The intracranial left ICA is widely patent. The left ACA and left MCA are also patent without evidence of significant proximal stenosis. No intracranial aneurysm is identified. IMPRESSION: 1. Acute to early subacute large right MCA infarct. 2. 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 3. Small chronic right frontal infarct. These results were called by telephone at the time of interpretation on 08/22/2016 at 1:35 pm to Dr. Dustin Flock, who verbally acknowledged these results. Electronically Signed   By: Logan Bores M.D.   On: 08/22/2016 13:46   ASSESSMENT AND PLAN:  61 y.o.malewith a history of COPD, DM, HTNnow being admitted with:  1. CVA - acute infarct at the right parietal and temporal lobes, and right basal ganglia - MRI Brain shows large right MCA territory CVA - MRA of the brain shows occlusion of right ICA and proximal MCA -Continue therapy with aspirin - hold eliquis for 7 days per Neurology rec to avoid hemorrhagic conversion -treat hypertriglyceridemia  2. Hyperkalemia Now resolved continue to monitor  3. Leukocytosis / Erythrocytosis, no evidence of active infection - chest x-ray and urinalysis was nonrevealing  4. Elevated LFTs, mild - Monitor CMP in AM Likely due to fatty liver  5. H/o COPD - Continue Advair and I'll add  6. H/O DM with hyperglycemia - Accuchecks achs with RISS coverage - Continue neurontin - resumemetformin  7. H/o HTN - Continue Lisinopril with hold parameters  8. nicotine abuse smoking cessation provided for many spent patient strongly recommended to stop smoking  9. ST input noted PT to see pt CSW for d/c planning. Pt has no insurance   Case discussed with Care Management/Social Worker. Management plans discussed with the patient, family and they are in agreement.  CODE STATUS: FULL  DVT Prophylaxis:SCD's. Avoid anti-plt for 7 days due to large  CVA  TOTAL TIME TAKING CARE OF THIS PATIENT: 30 minutes.  >50% time spent on counselling and coordination of care  POSSIBLE D/C in next couple daysDEPENDING ON CLINICAL CONDITION.  D/w son will meyers  Note: This dictation was prepared with Dragon dictation along with smaller phrase technology. Any transcriptional errors that result from this process are unintentional.  Dequante Tremaine M.D on 08/23/2016 at 7:58 AM  Between 7am to 6pm - Pager - [phone]  After 6pm go to [email or web address] - password [email or web address] [facility]  Office  [phone]  CC: Primary care physician; [facility]

## 2016-08-23 NOTE — Evaluation (Signed)
Physical Therapy Evaluation Patient Details Name: Chad Combs MRN: [id number] DOB: September 19, 1955 Today's Date: 08/23/2016   History of Present Illness  Pt is a 61 yo male admitted to ER with L- sided weakness that has persisted and a fall, MRI positive for ischemic CVA of R MCA and R internal carotid. Was on eliquois for LE DVT but recently ran out of medications. 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 He demonstrated good participation throughout and is eager to recover so he can return home. Pt displays significant L sided deficits, w/ severely diminished L touch and loss of proprioception. L UE was completely flaccid w/ no signs of muscle activation (0/5) and is at risk for L shoulder subluxation requiring support during functional tasks. Pt has some hip adductor and extensor activation (-2/5) but otherwise does not have active control of hip flexors, knee motions and ankle/foot motions. Pt requires max assistance for mobility due to impairments listed above and demonstrated L sided neglect in sitting. He leans to left side and required frequent verbal and tactile cues and min to mod assist to obtain midline posture while sitting at EOB. He did display some improvement in sitting w/ reaching activities to increase trunk control and midline alignment. Pt able to transfer to standing w/ max assist +2 and guarding of the L shoulder and knee, and required min to mod assist to maintain static standing. Attempted to take a step w/ R foot but patient began to stumble requiring max assist to prevent LOB. At baseline pt lives alone and was independent in all ADLs w/o an AD, he has family that lives close by and his son was present during our session. Overall patient displays significant weakness on the L side as well as impaired light touch and proprioception that severely  limit safe functional mobility, he will require skilled PT to improve overall function. Recommend he transition to CIR following acute hospital stay.     Follow Up Recommendations CIR    Equipment Recommendations  3in1 (PT);Rolling walker with 5" wheels;Other (comment) (platform Rolling walker )    Recommendations for Other Services       Precautions / Restrictions Precautions Precautions: Fall Precaution Comments: L sided neglect, shoulder increased risk for subluxation Restrictions Weight Bearing Restrictions: No      Mobility  Bed Mobility Overal bed mobility: Needs Assistance Bed Mobility: Sit to Supine;Supine to Sit     Supine to sit: Max assist;+2 for physical assistance Sit to supine: Max assist;+2 for physical assistance   General bed mobility comments: unable to initiate or move L side, cuing for hand placement on R to grab bed rail, max assist to move trunk into upright position   Transfers Overall transfer level: Needs assistance Equipment used: 2 person hand held assist Transfers: Sit to/from Stand Sit to Stand: Max assist;+2 physical assistance            Ambulation/Gait             General Gait Details: attempted to take step w/ R LE but required max assist +2 to prevent falling, did not attempt further ambulation due to safety concerns  Stairs            Wheelchair Mobility    Modified Rankin (Stroke Patients Only)       Balance Overall balance assessment: Needs assistance;History of Falls Sitting-balance  support: Single extremity supported;Feet supported Sitting balance-Leahy Scale: Poor Sitting balance - Comments: Pt has difficulty maintaining midline, pushes to L side, verbal and tactile cues to correct, decreased trunk control in sitting, laterally flexed trunk to R and elongated on L  Postural control: Left lateral lean;Posterior lean Standing balance support: Bilateral upper extremity supported Standing balance-Leahy Scale:  Poor Standing balance comment: No pushing in standing, requires hands on assist +2 to prevent LOB, guarding of the L Knee to prevent buckling and support of L shoulder to prevent potential subluxation                              Pertinent Vitals/Pain Pain Assessment: No/denies pain    Home Living Family/patient expects to be discharged to:: Private residence Living Arrangements: Alone   Type of Home: House Home Access: Level entry     Home Layout: One level Home Equipment: None      Prior Function Level of Independence: Independent         Comments: independent in ADLs and IADLs     Hand Dominance   Dominant Hand: Left    Extremity/Trunk Assessment   Upper Extremity Assessment Upper Extremity Assessment: LUE deficits/detail LUE Deficits / Details: MMT 0/5 for all gross movements, loss of light touch and proprioception, albe to sense some deep touch and pain LUE Sensation: decreased light touch;decreased proprioception LUE Coordination: decreased fine motor;decreased gross motor    Lower Extremity Assessment Lower Extremity Assessment: LLE deficits/detail LLE Deficits / Details: Has some hip adductors (-2/5) and hip extensor (-2/5) activation, all other movements 0/5, diminished light touch, no proprioception  LLE Sensation: decreased light touch;decreased proprioception LLE Coordination: decreased fine motor;decreased gross motor       Communication   Communication: No difficulties  Cognition Arousal/Alertness: Awake/alert Behavior During Therapy: WFL for tasks assessed/performed Overall Cognitive Status: Within Functional Limits for tasks assessed                      General Comments      Exercises Other Exercises Other Exercises: Sitting reaching activities to promote trunk control and promote sitting in midline to allow for seated tasks, 1x10 required HHA and occasional min to mod assist to maintain balance    Assessment/Plan     PT Assessment Patient needs continued PT services  PT Problem List Decreased strength;Decreased range of motion;Decreased balance;Decreased activity tolerance;Decreased mobility;Decreased coordination;Decreased knowledge of use of DME;Decreased knowledge of precautions;Impaired sensation       PT Treatment Interventions DME instruction;Gait training;Functional mobility training;Neuromuscular re-education;Balance training;Therapeutic exercise;Therapeutic activities;Patient/family education    PT Goals (Current goals can be found in the Care Plan section)  Acute Rehab PT Goals Patient Stated Goal: To get better and return home PT Goal Formulation: With patient/family Time For Goal Achievement: 09/06/16 Potential to Achieve Goals: Fair    Frequency 7X/week   Barriers to discharge Inaccessible home environment      Co-evaluation               End of Session Equipment Utilized During Treatment: Gait belt Activity Tolerance: Patient tolerated treatment well Patient left: in bed;with call bell/phone within reach;with family/visitor present;Other (comment) (Left w/ OT in room) Nurse Communication: Mobility status PT Visit Diagnosis: Unsteadiness on feet (R26.81);Muscle weakness (generalized) (M62.81);Difficulty in walking, not elsewhere classified (R26.2);History of falling (Z91.81);Hemiplegia and hemiparesis Hemiplegia - Right/Left: Left Hemiplegia - dominant/non-dominant: Dominant Hemiplegia - caused by: Cerebral infarction  Time: [id number] PT Time Calculation (min) (ACUTE ONLY): 40 min   Charges:         PT G Codes:         Mallika Sanmiguel Student PT  08/23/2016, 10:09 AM

## 2016-08-23 NOTE — Progress Notes (Signed)
Rehab Admissions Coordinator Note:  Patient was screened by [name] for appropriateness for an Inpatient Acute Rehab Consult. Noted SLP recommending SNF versus CIR.  Will await PT/OT evaluations to determine functional deficits.  Call me for questions.  [name] 08/23/2016, 8:23 AM  I can be reached at [phone].

## 2016-08-23 NOTE — Evaluation (Signed)
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 Was on eliquois for LE DVT but recently ran out of medications. 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 Is a 61  Y.o.  who was admitted for a CVA with Left sided weakness and a fall. He was living at home alone in a one story home with blindness in R eye since last CVA in 2015. He recently had a DVT in his L LE. Pt. presents with decreased strength, decreased functional hand use, no active volitional movement elicited, and decreased functional mobility which hinder his ability to complete ADL and IADL tasks. Patient could benefit from skilled OT services to work on LUE neuro muscular re-ed, therapeutic exercises, positioning, and pt./family education.  Patient and family education was provided about stroke recovery, LUE ROM and positioning. He is motivated to regain strength and independence again. Patient would be a good candidate to continue OT services at [facility].    Follow Up Recommendations  CIR    Equipment Recommendations  Tub/shower bench    Recommendations for Other Services       Precautions / Restrictions Precautions Precautions: Fall Precaution Comments: L sided neglect, shoulder increased risk for subluxation Restrictions Weight Bearing Restrictions: No      Mobility Bed Mobility Overal bed mobility: Needs Assistance Bed Mobility: Sit to Supine;Supine to Sit     Supine to sit: Max assist;+2 for physical assistance Sit to supine: Max assist;+2 for physical assistance   General bed mobility comments: unable to initiate or move L side, cuing for hand placement on R to grab bed rail, max assist to move trunk  into upright position   Transfers Overall transfer level: Needs assistance Equipment used: 2 person hand held assist Transfers: Sit to/from Stand Sit to Stand: Max assist;+2 physical assistance              Balance Overall balance assessment: Needs assistance;History of Falls Sitting-balance support: Single extremity supported;Feet supported Sitting balance-Leahy Scale: Poor Sitting balance - Comments: Pt has difficulty maintaining midline, pushes to L side, verbal and tactile cues to correct, decreased trunk control in sitting, laterally flexed trunk to R and elongated on L  Postural control: Left lateral lean;Posterior lean Standing balance support: Bilateral upper extremity supported Standing balance-Leahy Scale: Poor Standing balance comment: No pushing in standing, requires hands on assist +2 to prevent LOB, guarding of the L Knee to prevent buckling and support of L shoulder to prevent potential subluxation                             ADL Overall ADL's : Needs assistance/impaired Eating/Feeding: Set up;Moderate assistance Eating/Feeding Details (indicate cue type and reason): using R non dominant hand Grooming: Wash/dry hands;Wash/dry face;Oral care;Brushing hair;Set up;Moderate assistance Grooming Details (indicate cue type and reason): assist and cues for using non dominant hand and one handed tech         Upper Body Dressing : Moderate assistance;Set up   Lower Body Dressing: Moderate assistance;Set up;+2 for physical assistance;+2 for safety/equipment;Sit to/from stand Lower Body Dressing Details (indicate cue type and reason): L knee buckles and  needs 2 person assist for leaning forward and standing.  Unable to pull pants over hips on L side due to no active movement in LUE which is dominant hand and leans to left when sitting and leaning forward.                     Vision Patient Visual Report: Nausea/blurring vision with head movement Vision  Assessment?: Yes Eye Alignment: Impaired (comment) Ocular Range of Motion: Restricted on the right Alignment/Gaze Preference: Chin down Tracking/Visual Pursuits: Right eye does not track laterally;Right eye does not track medially;Decreased smoothness of eye movement to LEFT superior field Saccades: Additional head turns occurred during testing;Decreased speed of saccadic movement Convergence: Impaired (comment) Visual Fields: Left visual field deficit Additional Comments: Pt is blind in R eye since CVA in 2015; functionally appears to have decreased vision in L upper quadrant     Perception Perception Perception Tested?: No   Praxis Praxis Praxis tested?: Not tested    Pertinent Vitals/Pain Pain Assessment: No/denies pain     Hand Dominance Left   Extremity/Trunk Assessment Upper Extremity Assessment Upper Extremity Assessment: LUE deficits/detail LUE Deficits / Details: MMT 0/5 for all gross movements, loss of light touch and proprioception, albe to sense some deep touch and pain. No subluxation noted and no edema. LUE Sensation: decreased light touch;decreased proprioception LUE Coordination: decreased fine motor;decreased gross motor   Lower Extremity Assessment Lower Extremity Assessment: Defer to PT evaluation LLE Deficits / Details: Has some hip adductors (-2/5) and hip extensor (-2/5) activation, all other movements 0/5, diminished light touch, no proprioception  LLE Sensation: decreased light touch;decreased proprioception LLE Coordination: decreased fine motor;decreased gross motor       Communication Communication Communication: No difficulties   Cognition Arousal/Alertness: Awake/alert Behavior During Therapy: WFL for tasks assessed/performed Overall Cognitive Status: Within Functional Limits for tasks assessed                     General Comments       Exercises   Other Exercises Other Exercises: Sitting reaching activities to promote trunk  control and promote sitting in midline to allow for seated tasks, 1x10 required HHA and occasional min to mod assist to maintain balance    Shoulder Instructions      Home Living Family/patient expects to be discharged to:: Private residence Living Arrangements: Alone   Type of Home: House Home Access: Level entry     Home Layout: One level     Bathroom Shower/Tub: Tub/shower unit Shower/tub characteristics: Architectural technologist: Standard Bathroom Accessibility: Yes   Home Equipment: Grab bars - toilet;Grab bars - tub/shower          Prior Functioning/Environment Level of Independence: Independent        Comments: independent in ADLs and IADLs        OT Problem List: Decreased strength;Decreased range of motion;Decreased activity tolerance;Decreased coordination;Impaired vision/perception;Impaired balance (sitting and/or standing);Impaired tone;Impaired sensation;Impaired UE functional use;Decreased safety awareness      OT Treatment/Interventions: Self-care/ADL training;Therapeutic exercise;Neuromuscular education;Patient/family education;Visual/perceptual remediation/compensation;Therapeutic activities;Balance training    OT Goals(Current goals can be found in the care plan section) Acute Rehab OT Goals Patient Stated Goal: To get better and return home OT Goal Formulation: With patient/family Time For Goal Achievement: 09/06/16 Potential to Achieve Goals: Good ADL Goals Pt Will Perform Eating: with set-up;with min assist;sitting (using R non dominant hand) Pt Will Perform Grooming: with set-up;with min assist;sitting (uisng R hand ) Pt Will Perform Upper  Body Dressing: with set-up;with mod assist;sitting Pt Will Perform Lower Body Dressing: with set-up;with mod assist;sit to/from stand (with min cues for staying in midline) Pt Will Transfer to Toilet: with set-up;with max assist;stand pivot transfer;bedside commode;with +2 assist (BSC over toilet) Pt/caregiver  will Perform Home Exercise Program: Left upper extremity;Increased ROM;Increased strength;With written HEP provided;With minimal assist  OT Frequency: Min 1X/week   Barriers to D/C:    was living at home alone       Co-evaluation              End of Session    Activity Tolerance: Patient tolerated treatment well Patient left: in bed;with call bell/phone within reach;with bed alarm set;with family/visitor present  OT Visit Diagnosis: Muscle weakness (generalized) (M62.81);Hemiplegia and hemiparesis Hemiplegia - Right/Left: Left Hemiplegia - dominant/non-dominant: Dominant Hemiplegia - caused by: Cerebral infarction                ADL either performed or assessed with clinical judgement  Time: 0930-1005 OT Time Calculation (min): 35 min Charges:  OT General Charges $OT Visit: 1 Procedure OT Evaluation $OT Eval Moderate Complexity: 1 Procedure OT Treatments $Self Care/Home Management : 8-22 mins G-Codes:     Chrys Racer, OTR/L ascom [phone] 08/23/16, 10:29 AM

## 2016-08-24 ENCOUNTER — Encounter
Admission: EM | Disposition: A | Discharge status: Rehabilitation Facility | Payer: Self-pay | Source: Home / Self Care | Attending: Internal Medicine

## 2016-08-24 ENCOUNTER — Inpatient Hospital Stay: Payer: Self-pay

## 2016-08-24 DIAGNOSIS — I2510 Atherosclerotic heart disease of native coronary artery without angina pectoris: Secondary | ICD-10-CM

## 2016-08-24 DIAGNOSIS — I6522 Occlusion and stenosis of left carotid artery: Secondary | ICD-10-CM

## 2016-08-24 DIAGNOSIS — I6789 Other cerebrovascular disease: Secondary | ICD-10-CM

## 2016-08-24 DIAGNOSIS — I82409 Acute embolism and thrombosis of unspecified deep veins of unspecified lower extremity: Secondary | ICD-10-CM

## 2016-08-24 HISTORY — PX: IVC FILTER INSERTION: CATH118245

## 2016-08-24 LAB — GLUCOSE, CAPILLARY
GLUCOSE-CAPILLARY: 217 mg/dL — AB (ref 65–99)
GLUCOSE-CAPILLARY: 260 mg/dL — AB (ref 65–99)
GLUCOSE-CAPILLARY: 266 mg/dL — AB (ref 65–99)
Glucose-Capillary: 199 mg/dL — ABNORMAL HIGH (ref 65–99)

## 2016-08-24 LAB — TROPONIN I
Troponin I: <0.03 ng/mL (ref <0.03)
Troponin I: <0.03 ng/mL (ref <0.03)
Troponin I: 0.05 ng/mL (ref <0.03)
Troponin I: <0.03 ng/mL (ref <0.03)

## 2016-08-24 SURGERY — IVC FILTER INSERTION
Anesthesia: Moderate Sedation

## 2016-08-24 MED ORDER — CEFAZOLIN IN D5W 1 GM/50ML IV SOLN
1.0000 g | Freq: Once | INTRAVENOUS | Status: AC
  Administered 2016-08-24: 1 g via INTRAVENOUS
  Filled 2016-08-24: qty 50

## 2016-08-24 MED ORDER — INSULIN GLARGINE 100 UNIT/ML ~~LOC~~ SOLN
20.0000 [IU] | Freq: Every day | SUBCUTANEOUS | Status: DC
  Administered 2016-08-24 – 2016-08-25 (×2): 20 [IU] via SUBCUTANEOUS
  Filled 2016-08-24 (×2): qty 0.2

## 2016-08-24 MED ORDER — CEFAZOLIN IN D5W 1 GM/50ML IV SOLN: 1.0000 g | Freq: Once | INTRAVENOUS | Status: DC

## 2016-08-24 MED ORDER — BISACODYL 10 MG RE SUPP
10.0000 mg | Freq: Every day | RECTAL | Status: DC | PRN
  Administered 2016-08-25: 09:00:00 10 mg via RECTAL
  Filled 2016-08-24: qty 1

## 2016-08-24 MED ORDER — FENTANYL CITRATE (PF) 100 MCG/2ML IJ SOLN
INTRAMUSCULAR | Status: AC
  Filled 2016-08-24: qty 2

## 2016-08-24 MED ORDER — LIDOCAINE HCL (PF) 1 % IJ SOLN
INTRAMUSCULAR | Status: AC
  Filled 2016-08-24: qty 30

## 2016-08-24 MED ORDER — HEPARIN (PORCINE) IN NACL 2-0.9 UNIT/ML-% IJ SOLN
INTRAMUSCULAR | Status: AC
  Filled 2016-08-24: qty 500

## 2016-08-24 MED ORDER — IOPAMIDOL (ISOVUE-300) INJECTION 61%
INTRAVENOUS | Status: DC | PRN
  Administered 2016-08-24: 15 mL via INTRAVENOUS

## 2016-08-24 MED ORDER — MIDAZOLAM HCL 5 MG/5ML IJ SOLN
INTRAMUSCULAR | Status: AC
  Filled 2016-08-24: qty 5

## 2016-08-24 MED ORDER — FLUTICASONE PROPIONATE 50 MCG/ACT NA SUSP
1.0000 | Freq: Every day | NASAL | Status: DC
  Administered 2016-08-24 – 2016-08-25 (×3): 1 via NASAL
  Filled 2016-08-24: qty 16

## 2016-08-24 MED ORDER — MIDAZOLAM HCL 2 MG/2ML IJ SOLN
INTRAMUSCULAR | Status: DC | PRN
  Administered 2016-08-24: 2 mg via INTRAVENOUS

## 2016-08-24 SURGICAL SUPPLY — 5 items
GUIDEWIRE 3MM J TIP .035 145 (WIRE) ×3 IMPLANT
KIT FEMORAL DEL DENALI (Miscellaneous) ×3 IMPLANT
NEEDLE ENTRY 21GA 7CM ECHOTIP (NEEDLE) ×3 IMPLANT
PACK ANGIOGRAPHY (CUSTOM PROCEDURE TRAY) ×3 IMPLANT
SET INTRO CAPELLA COAXIAL (SET/KITS/TRAYS/PACK) ×3 IMPLANT

## 2016-08-24 NOTE — Care Management (Signed)
Dr. Posey Pronto spoke with son [name]. All the children are going to be able to stay with their father 24/7 after being discharged from Inpatient Acute Rehab. Telephone call to [facility] representative for [facility] States she has 5 beds available today, but has 5 patients that are scheduled to come in. If she has a cancellation she will call this case Freight forwarder. [name] RN MSN CCM Case Manager

## 2016-08-24 NOTE — Progress Notes (Signed)
Rehab admissions - I spoke with patient's son, Will, this am.  He is in agreement to patient admitting to acute inpatient rehab today.  Patient has been cleared by attending MD.  I have a bed available and will admit to [facility] inpatient rehab today.  Call me for questions.  [phone]

## 2016-08-24 NOTE — Progress Notes (Addendum)
SLP Cancellation Note  Patient Details Name: Chad Combs MRN: [id number] DOB: 02-22-1956   Cancelled treatment:       Reason Eval/Treat Not Completed: Patient at procedure or test/unavailable (pt is scheduled for IVC filter today. ST will f/u tomorrow.). NSG reported pt has been tolerating po meds w/ puree w/ no overt s/s of aspiration noted; no reports of s/s of aspiration last evening during dinner meal. Pt continues w/ Left orolabial weakness requiring monitoring for oral clearing; Left neglect s/s. Pt would benefit from a Cognitive assessment d/t R MCA, Dysarthria assessment/tx. Continue aspiration precautions and monitoring w/ any oral intake.    Orinda Kenner, MS, CCC-SLP Watson,Katherine 08/24/2016, 3:18 PM

## 2016-08-24 NOTE — Progress Notes (Signed)
Chad Combs at [facility] NAME: Chad Combs    MR#:  [id number]  DATE OF BIRTH:  1955/08/15  SUBJECTIVE:   Accidental fall last nite w/o injury per RN  Remains with left sided complete hemiplegia Tolerating diet  REVIEW OF SYSTEMS:   Review of Systems  Constitutional: Negative for chills, fever and weight loss.  HENT: Negative for ear discharge, ear pain and nosebleeds.   Eyes: Negative for blurred vision, pain and discharge.  Respiratory: Negative for sputum production, shortness of breath, wheezing and stridor.   Cardiovascular: Negative for chest pain, palpitations, orthopnea and PND.  Gastrointestinal: Negative for abdominal pain, diarrhea, nausea and vomiting.  Genitourinary: Negative for frequency and urgency.  Musculoskeletal: Negative for back pain and joint pain.  Neurological: Positive for focal weakness and weakness. Negative for sensory change and speech change.  Psychiatric/Behavioral: Negative for depression and hallucinations. The patient is not nervous/anxious.    Tolerating Diet:yes Tolerating [id number] inpatient rehab  DRUG ALLERGIES:  No Known Allergies  VITALS:  Blood pressure (!) 155/80, pulse 99, temperature 99.4 F (37.4 C), temperature source Oral, resp. rate 16, height 5\' 9"  (1.753 m), weight 93.9 kg (207 lb), SpO2 93 %.  PHYSICAL EXAMINATION:   Physical Exam  GENERAL:  61 y.o.-year-old patient lying in the bed with no acute distress.  EYES: Pupils equal, round, reactive to light and accommodation. No scleral icterus. Extraocular muscles intact.  HEENT: Head atraumatic, normocephalic. Oropharynx and nasopharynx clear.  NECK:  Supple, no jugular venous distention. No thyroid enlargement, no tenderness.  LUNGS: Normal breath sounds bilaterally, no wheezing, rales, rhonchi. No use of accessory muscles of respiration.  CARDIOVASCULAR: S1, S2 normal. No murmurs, rubs, or gallops.  ABDOMEN: Soft, nontender,  nondistended. Bowel sounds present. No organomegaly or mass.  EXTREMITIES: No cyanosis, clubbing or edema b/l.    NEUROLOGIC: dense left sided hemiplegia PSYCHIATRIC:  patient is alert and oriented x 3.  SKIN: No obvious rash, lesion, or ulcer.   LABORATORY PANEL:  CBC  Recent Labs Lab 08/22/16 0147  WBC 17.5*  HGB 19.0*  HCT 54.4*  PLT 161    Chemistries   Recent Labs Lab 08/22/16 0147  NA 138  K 4.1  CL 103  CO2 24  GLUCOSE 271*  BUN 19  CREATININE 0.95  CALCIUM 9.3  AST 47*  ALT 98*  ALKPHOS 65  BILITOT 1.2   Cardiac Enzymes  Recent Labs Lab 08/24/16 1317  TROPONINI 0.05*   RADIOLOGY:  Dg Chest 1 View  Result Date: 08/23/2016 CLINICAL DATA:  Short of breath EXAM: CHEST 1 VIEW COMPARISON:  None. FINDINGS: Normal heart size. Lungs clear. No pneumothorax. No pleural effusion. IMPRESSION: No active disease. Electronically Signed   By: Marybelle Killings M.D.   On: 08/23/2016 07:44   US Venous Img Lower Unilateral Left  Result Date: 08/24/2016 CLINICAL DATA:  Recent LEFT lower extremity deep venous thrombosis. LEFT-sided weakness. EXAM: LOWER EXTREMITY VENOUS DOPPLER ULTRASOUND TECHNIQUE: Gray-scale sonography with graded compression, as well as color Doppler and duplex ultrasound were performed to evaluate the lower extremity deep venous systems from the level of the common femoral vein and including the common femoral, femoral, profunda femoral, popliteal and calf veins including the posterior tibial, peroneal and gastrocnemius veins when visible. The superficial great saphenous vein was also interrogated. Spectral Doppler was utilized to evaluate flow at rest and with distal augmentation maneuvers in the common femoral, femoral and popliteal veins. COMPARISON:  None. FINDINGS: Contralateral  Common Femoral Vein: Respiratory phasicity is normal and symmetric with the symptomatic side. No evidence of thrombus. Normal compressibility. Common Femoral Vein: Nonocclusive thrombus.   Non compressible Saphenofemoral Junction: No evidence of thrombus. Normal compressibility and flow on color Doppler imaging. Profunda Femoral Vein: No evidence of thrombus. Normal compressibility and flow on color Doppler imaging. Femoral Vein: Nonocclusive thrombus.  Non compressible. Popliteal Vein: No evidence of thrombus. Normal compressibility, respiratory phasicity and response to augmentation. Calf Veins: No evidence of thrombus. Normal compressibility and flow on color Doppler imaging. Superficial Great Saphenous Vein: No evidence of thrombus. Normal compressibility and flow on color Doppler imaging. IMPRESSION: Nonocclusive thrombosis within the LEFT common femoral vein and femoral vein. Decreased clot burden to ultrasound 05/04/2016. No new thrombus present. Electronically Signed   By: Suzy Bouchard M.D.   On: 08/24/2016 12:19   ASSESSMENT AND PLAN:  61 y.o.malewith a history of COPD, DM, HTNnow being admitted with:  1. CVA - acute infarct at the right parietal and temporal lobes, and right basal ganglia - MRI Brain shows large right MCA territory CVA - MRA of the brain shows occlusion of right ICA and proximal MCA -Continue therapy with aspirin - hold eliquis per Neurology rec to avoid hemorrhagic conversion -treat hypertriglyceridemia  2. Hyperkalemia Now resolved continue to monitor  3. Leukocytosis / Erythrocytosis, no evidence of active infection - chest x-ray and urinalysis was nonrevealing  4. Elevated LFTs, mild Likely due to fatty liver  5. H/o COPD - Continue Advair and I'll add  6. H/O DM with hyperglycemia - Accuchecks achs with RISS coverage - Continue neurontin - resume metformin  7. H/o HTN - Continue Lisinopril with hold parameters  8. Left LE DVT in November 2017. Pt has been non compliant with eliquis due to financial constraint. -Repeat US LE shows persistent non occlusive DVT in the left LE. Since pt has to get PT and risk of PE will  have Vascular see pt. -pt was seen by Dr Erven Colla and has IVC filter placed  9. ST input noted PT to see pt CSW for d/c planning. Pt has no insurance  Pt has a bed at [facility]. He can d/c on march 7th if the bed is still available  D/w pt's son Chad Combs in the room   Case discussed with Care Management/Social Worker. Management plans discussed with the patient, family and they are in agreement.  CODE STATUS: FULL  DVT Prophylaxis:SCD's. Avoid anti-plt due to large CVA  TOTAL TIME TAKING CARE OF THIS PATIENT: 30 minutes.  >50% time spent on counselling and coordination of care  POSSIBLE D/C in next couple daysDEPENDING ON CLINICAL CONDITION.  D/w son will meyers  Note: This dictation was prepared with Dragon dictation along with smaller phrase technology. Any transcriptional errors that result from this process are unintentional.  Caylon Saine M.D on 08/24/2016 at 5:18 PM  Between 7am to 6pm - Pager - [phone]  After 6pm go to [email or web address] - password [email or web address] [facility]  Office  [phone]  CC: Primary care physician; [facility]

## 2016-08-24 NOTE — Progress Notes (Signed)
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 Was on eliquois for LE DVT but recently ran out of medications. 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  Cues to look left throughout session.  Pt c/o not sleeping more than an hour last night. Pt's son arrived at end of session and updated and stated his father always says he doesn't sleep well but usually sleeps more than he thinks he did.  Pt continues to be very motivated to regain independence in ADLs and use of L dominant hand.   Follow Up Recommendations  CIR    Equipment Recommendations  Tub/shower bench    Recommendations for Other Services      Precautions / Restrictions Precautions Precautions: Fall Precaution Comments: L sided neglect, shoulder increased risk for subluxation--pt fell out of bed last night with knee abrasion only per NSG report Restrictions Weight Bearing Restrictions: No       Mobility Bed Mobility                  Transfers                      Balance                                   ADL Overall ADL's : Needs assistance/impaired                                       General ADL Comments: Pt seen for one handed tech for use when answering cell phone and neuromuscular re-ed for LUE and hand but no active movement elicited after tapping, no increased edema and both arms warm to touch but no redness anywhere.  Cues to look left  throughout session.  Pt c/o not sleeping more than an hour last night.      Vision                     Perception     Praxis      Cognition   Behavior During Therapy: WFL for tasks assessed/performed Overall Cognitive Status: Within Functional Limits for tasks assessed                         Exercises     Shoulder Instructions       General Comments      Pertinent Vitals/ Pain       Pain Assessment: No/denies pain  Home Living                                          Prior Functioning/Environment  Frequency  Min 1X/week        Progress Toward Goals  OT Goals(current goals can now be found in the care plan section)  Progress towards OT goals: Progressing toward goals  Acute Rehab OT Goals Patient Stated Goal: To get better and return home  Plan Discharge plan remains appropriate    Co-evaluation                 End of Session    OT Visit Diagnosis: Muscle weakness (generalized) (M62.81);Hemiplegia and hemiparesis Hemiplegia - Right/Left: Left Hemiplegia - dominant/non-dominant: Dominant Hemiplegia - caused by: Cerebral infarction   Activity Tolerance Patient tolerated treatment well   Patient Left in bed;with call bell/phone within reach;with bed alarm set;with family/visitor present   Nurse Communication          Time: [id number] OT Time Calculation (min): 26 min  Charges: OT General Charges $OT Visit: 1 Procedure OT Treatments $Self Care/Home Management : 8-22 mins $Neuromuscular Re-education: 8-22 mins   Chrys Racer, OTR/L ascom [phone] 08/24/16, 10:22 AM

## 2016-08-24 NOTE — Progress Notes (Signed)
PT Cancellation Note  Patient Details Name: Chad Combs MRN: [id number] DOB: 02-Jun-1956   Cancelled Treatment:    Reason Eval/Treat Not Completed: Patient at procedure or test/unavailable.  Testing today showing non-occlusive DVT L CFV and femoral vein.  Pt currently off floor for procedure.  Per notes, plan for IVC filter placement today.  Will re-attempt PT treatment at a later date/time.  Leitha Bleak, PT 08/24/16, 4:05 PM [phone]

## 2016-08-24 NOTE — Progress Notes (Signed)
PT Cancellation Note  Patient Details Name: Chad Combs MRN: [id number] DOB: [birth year]   Cancelled Treatment:    Reason Eval/Treat Not Completed: Patient at procedure or test/unavailable (pt out of room for doppler imaging of L LE to assess for DVT, will reattempt at later time when medically appropriate)   Orlene Och Student PT 08/24/2016, 12:04 PM

## 2016-08-24 NOTE — Care Management (Signed)
Genie Logue RN  has accepted Chad Combs into the Inpatient Acute Rehab unit for today. Scheduled for IVC filter placement today. Spoke with Ms. Logue RN. States that if Chad. Andaya can be at [facility] by 6:00pm they will accept him. Will need to call back in the morning for bed availability if not at their facility by 6:00pm.  Accepting physician is Karmen Stabs. Bed assignment is 4 [place] 20.  Chad Combs and son Chad Combs updated. [name] RN MSN CCM Care Management

## 2016-08-24 NOTE — Progress Notes (Signed)
Pt clinically stable post ivc filter placement. Only received versed 2mg  iv for procedure. Vitals stable post procedure. No bleeding nor hematoma at right groin site. Report called to  Mechele Claude (care nurse) for 112. Plan reviewed. Questions answered.

## 2016-08-24 NOTE — Progress Notes (Addendum)
Inpatient Diabetes Program Recommendations  AACE/ADA: New Consensus Statement on Inpatient Glycemic Control (2015)  Target Ranges:  Prepandial:   less than 140 mg/dL      Peak postprandial:   less than 180 mg/dL (1-2 hours)      Critically ill patients:  140 - 180 mg/dL   Lab Results  Component Value Date   GLUCAP 260 (H) 08/24/2016   HGBA1C 10.5 (H) 08/22/2016    Review of Glycemic Control:  Results for MAGDIEL, NUTTING (MRN [id number]) as of 08/24/2016 11:48  Ref. Range 08/23/2016 12:10 08/23/2016 16:38 08/23/2016 21:04 08/24/2016 07:34 08/24/2016 11:45  Glucose-Capillary Latest Ref Range: 65 - 99 mg/dL 272 (H) 235 (H) 270 (H) 217 (H) 260 (H)   Inpatient Diabetes Program Recommendations:    Note that Lantus 20 units added today.  A1C indicates that patient will likely need insulin at discharge.  To d/c to [facility] inpatient rehab. Will need insulin teaching while at CIR prior to d/c home.   Will follow.  Thanks, Adah Perl, RN, BC-ADM Inpatient Diabetes Coordinator Pager [phone] (8a-5p)

## 2016-08-24 NOTE — PMR Pre-admission (Addendum)
[facility] PMR Admission Coordinator Pre-Admission Assessment  Patient: Chad Combs is an 61 y.o., male MRN: [id number] DOB: [birth year] Height: 5\' 9"  (175.3 cm) Weight: 94.1 kg (207 lb 8 oz) (w/o SCD ON BED)  Insurance Information Self pay - no insurance  Medicaid Application Date:        Case Manager:   Disability Application Date:        Case Worker:    Emergency Facilities manager Information    Name Relation Home Work Mobile   [name],[name] [phone]        Current Medical History  Patient Admitting Diagnosis:  R MCA CVA  History of Present Illness: A 61 y.o. 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 Patient's family reportedly checked on the patient after they did not see him on social media for about 10 hours. Patient states his symptoms started around 3 PM 08/21/16, about 5-6 hours prior to arrival in the emergency department although he was by himself. He reports sudden onset of left-sided weakness associated with the fall. His symptoms have persisted. Per report, pt had stopped taking his Eloquis(ran out).  Noted MRI results of acute infarct at the right parietal and temporal lobes, and right basal ganglia.  PT/OT/SLP evaluations completed with recommendations for acute inpatient rehab admission.  Patient started on dysphagia 3, thin liquids.  Patient underwent an IVC filter placement on 08/24/16 due to his history of DVT with PE.  Patient's medical record from  [facility] has been reviewed by the rehabilitation admission coordinator and physician.  NIH Stroke scale: 17  Past Medical History  Past Medical History:  Diagnosis Date  . Blind right eye   . COPD (chronic obstructive pulmonary disease) ([place])   . Diabetes mellitus without complication ([place])   . Hypertension     Family  History   family history includes Cancer in his mother; Heart disease in his father.  Prior Rehab/Hospitalizations Has the patient had major surgery during 100 days prior to admission? No   Current Medications  See MAR from [place] [facility]  Patients Current Diet:   Dys 3, thin liquids  Precautions / Restrictions Precautions Precautions: Fall Precaution Comments: L sided neglect, shoulder increased risk for subluxation--pt fell out of bed last night with knee abrasion only per NSG report Restrictions Weight Bearing Restrictions: No   Has the patient had 2 or more falls or a fall with injury in the past year?Yes.  First fall was at the time of the CVA and the second fall was on 08/23/16  Prior Activity Level Limited Community (1-2x/wk): Went out 2-3 times a week, was driving.  Prior Functional Level Self Care: Did the patient need help bathing, dressing, using the toilet or eating?  Independent  Indoor Mobility: Did the patient need assistance with walking from room to room (with or without device)? Independent  Stairs: Did the patient need assistance with internal or external stairs (with or without device)? Independent  Functional Cognition: Did the patient need help planning regular tasks such as shopping or remembering to take medications? Independent  Home Assistive Devices / Equipment Home Assistive Devices/Equipment: Eyeglasses Home Equipment: Grab bars - toilet, Grab bars - tub/shower  Prior Device Use: Indicate devices/aids used by the patient prior to current illness, exacerbation or injury? None   Prior  Functional Level Current Functional Level  Bed Mobility  Independent  Max assist   Transfers  Independent  Max assist   Mobility - Walk/Wheelchair  Independent  Max assist (Needed +2 max assist to take a step.)   Upper Body Dressing  Independent  Mod assist   Lower Body Dressing  Independent  Mod assist   Grooming  Independent  Mod  assist   Eating/Drinking  Independent  Mod assist   Toilet Transfer  Independent  Total assist   Bladder Continence   WDL  Voiding in urinal   Bowel Management  WDL  No documented BM since admission.  Patient reports BM on 08/19/16   Stair Climbing  Independent Other (Not tested.)   Communication  Verbally intact  Verbal   Memory  Mild impairment  Mild impairment   Cooking/Meal Prep  Independent      Housework  independent    Money Management  Independent    Driving  Yes, independent     Special needs/care consideration BiPAP/CPAP No CPM No Continuous Drip IV No Dialysis No       Life Vest No Oxygen No Special Bed No Trach Size No Wound Vac (area) No     Skin Knee abrasion from fall 08/23/16                            Bowel mgmt: No BM since 08/19/16 per patient Bladder mgmt: Voiding in urinal Diabetic mgmt Yes, on oral medication and insulin at home  Previous Home Environment Living Arrangements: Alone  Lives With: Alone Available Help at Discharge: Family Type of Home: House Home Layout: One level Home Access: Level entry Bathroom Shower/Tub: Chiropodist: Standard Bathroom Accessibility: Yes [place]: No  Discharge Living Setting Plans for Discharge Living Setting: [place] (Lived alone.  Will have children stay after discharge.) Type of Home at Discharge: Apartment Discharge Home Layout: One level Discharge Home Access: Level entry Does the patient have any problems obtaining your medications?: No  Social/Family/Support Systems Patient Roles: Parent (Has 2 sons.) Contact Information: Gilverto Rookstool - son Anticipated Caregiver: Will Bjorn Loser - son Anticipated Caregiver's Contact Information: Will - son - [phone] Ability/Limitations of Caregiver: Son plans for him, his brother and sister to share cargiver responsibility Caregiver Availability: 24/7 (Son aware of need for 24/7 care/supervision  initially.) Discharge Plan Discussed with Primary Caregiver: Yes Is Caregiver In Agreement with Plan?: Yes Does Caregiver/Family have Issues with Lodging/Transportation while Pt is in Rehab?: No  Goals/Additional Needs Patient/Family Goal for Rehab: PT/OT supervision to min assist, SLP supervision goals Expected length of stay: 14-18 days Cultural Considerations: None Dietary Needs: Dys 3, thin liquids Equipment Needs: TBD Pt/Family Agrees to Admission and willing to participate: Yes Program Orientation Provided & Reviewed with Pt/Caregiver Including Roles  & Responsibilities: Yes  Patient Condition: Patient has suffered a right MCA stroke.  He is currently requiring maximum assist for mobility and mod to max assist for ADLs.  He lived alone, but children are planning to stay and assist him after rehab discharge.  Patient will benefit from 3 hours of therapy a day and can tolerate the therapy.  He will benefit from the coordinated approach of the inpatient rehab team care.  He will need PT/OT/SLP therapies on going.  I have discussed all information and progress with rehab MD and have approval for acute inpatient rehab admission for today.  Patient and son are in agreement  to CIR admission.  Preadmission Screen Completed By:  [name], 08/24/2016 11:17 AM ______________________________________________________________________   Discussed status with Dr. Naaman Plummer on 08/25/16 at 4 and received telephone approval for admission today.  Admission Coordinator:  [name], time 1106/Date 08/25/16   Assessment/Plan: Diagnosis: Right MCA infarct 1. Does the need for close, 24 hr/day  Medical supervision in concert with the patient's rehab needs make it unreasonable for this patient to be served in a less intensive setting? Yes 2. Co-Morbidities requiring supervision/potential complications: COPD, DM, labile HTN, post-stroke sequelae 3. Due to bladder management, bowel management, safety,  skin/wound care, disease management, medication administration, pain management and patient education, does the patient require 24 hr/day rehab nursing? Yes 4. Does the patient require coordinated care of a physician, rehab nurse, PT (1-2 hrs/day, 5 days/week), OT (1-2 hrs/day, 5 days/week) and SLP (1-2 hrs/day, 5 days/week) to address physical and functional deficits in the context of the above medical diagnosis(es)? Yes Addressing deficits in the following areas: balance, endurance, locomotion, strength, transferring, bowel/bladder control, bathing, dressing, feeding, grooming, toileting, cognition, speech, swallowing and psychosocial support 5. Can the patient actively participate in an intensive therapy program of at least 3 hrs of therapy 5 days a week? Yes 6. The potential for patient to make measurable gains while on inpatient rehab is excellent 7. Anticipated functional outcomes upon discharge from inpatients are: supervision and min assist PT, supervision and min assist OT, supervision SLP 8. Estimated rehab length of stay to reach the above functional goals is: 14-18 days 9. Does the patient have adequate social supports to accommodate these discharge functional goals? Yes 10. Anticipated D/C setting: Home 11. Anticipated post D/C treatments: HH therapy and Outpatient therapy 12. Overall Rehab/Functional Prognosis: excellent    RECOMMENDATIONS: This patient's condition is appropriate for continued rehabilitative care in the following setting: CIR Patient has agreed to participate in recommended program. Yes Note that insurance prior authorization may be required for reimbursement for recommended care.  Comment:Admit to inpatient rehab today  Meredith Staggers, MD, [facility] 08/24/2016   [name] 08/24/2016

## 2016-08-24 NOTE — Consult Note (Signed)
[facility] Consult Note  MRN : [id number]  Chad Combs is a 61 y.o. (Jul 14, 1955) male who presents with chief complaint of  Chief Complaint  Patient presents with  . Cerebrovascular Accident  .  History of Present Illness: I am asked to evaluate the patient by Dr. Posey Pronto. 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  He was status post fall secondary to his acute right hemispheric infarct, left sided weakness and confusion.   CT head done with evolving acute infarct in right parietal and temporal lobes. MRI/MRA brain done revealing occlusion of R-ICA and R-MCA with acute to early subacute large R-MCA infarct. Carotid dopplers with occlusion of R-ICA and significant plaque at level of the left carotid bulb and proximal IC causing significant proximal ICA stenosis > 70%.  Follow up dopplers done today show non-occlusive DVT left CFV and femoral vein.   Current Facility-Administered Medications  Medication Dose Route Frequency Provider Last Rate Last Dose  . [MAR Hold] acetaminophen (TYLENOL) tablet 650 mg  650 mg Oral Q4H PRN Alexis Hugelmeyer, DO   650 mg at 08/24/16 0753   Or  . [MAR Hold] acetaminophen (TYLENOL) solution 650 mg  650 mg Per Tube Q4H PRN Alexis Hugelmeyer, DO       Or  . Doug Sou Hold] acetaminophen (TYLENOL) suppository 650 mg  650 mg Rectal Q4H PRN Alexis Hugelmeyer, DO      . [MAR Hold] alum & mag hydroxide-simeth (MAALOX/MYLANTA) 200-200-20 MG/5ML suspension 15 mL  15 mL Oral Q6H PRN Dustin Flock, MD      . Doug Sou Hold] aspirin chewable tablet 81 mg  81 mg Oral Daily Dustin Flock, MD   Stopped at 08/25/16 1000  . [MAR Hold] atorvastatin (LIPITOR) tablet 40 mg  40 mg Oral q1800 Dustin Flock, MD   40 mg at 08/23/16 1750  . [MAR Hold] bisacodyl (DULCOLAX)  suppository 10 mg  10 mg Rectal Daily PRN Fritzi Mandes, MD      . Doug Sou Hold] ceFAZolin (ANCEF) IVPB 1 g/50 mL premix  1 g Intravenous Once Fritzi Mandes, MD      . Doug Sou Hold] clopidogrel (PLAVIX) tablet 75 mg  75 mg Oral Daily Alexis Goodell, MD   Stopped at 08/25/16 1000  . [MAR Hold] fluticasone (FLONASE) 50 MCG/ACT nasal spray 1 spray  1 spray Each Nare Daily Fritzi Mandes, MD   1 spray at 08/24/16 1332  . [MAR Hold] gabapentin (NEURONTIN) capsule 100 mg  100 mg Oral BID PRN Alexis Hugelmeyer, DO      . [MAR Hold] insulin aspart (novoLOG) injection 0-20 Units  0-20 Units Subcutaneous TID WC & HS Alexis Hugelmeyer, DO   11 Units at 08/24/16 1329  . [MAR Hold] insulin glargine (LANTUS) injection 20 Units  20 Units Subcutaneous Daily Fritzi Mandes, MD   20 Units at 08/24/16 1339  . [MAR Hold] lisinopril (PRINIVIL,ZESTRIL) tablet 20 mg  20 mg Oral Daily Alexis Hugelmeyer, DO   20 mg at 08/23/16 0933  . [MAR Hold] metFORMIN (GLUCOPHAGE) tablet 1,000 mg  1,000 mg Oral BID WC Fritzi Mandes, MD   1,000 mg at 08/24/16 0753  . [MAR Hold] mometasone-formoterol (DULERA) 100-5 MCG/ACT inhaler 2 puff  2 puff Inhalation BID Alexis Hugelmeyer, DO   2 puff at 08/24/16 0754  . [  MAR Hold] nicotine (NICODERM CQ - dosed in mg/24 hours) patch 21 mg  21 mg Transdermal Daily Saundra Shelling, MD   21 mg at 08/24/16 0547  . [MAR Hold] senna-docusate (Senokot-S) tablet 1 tablet  1 tablet Oral QHS PRN Harvie Bridge, DO        Past Medical History:  Diagnosis Date  . Blind right eye   . COPD (chronic obstructive pulmonary disease) ([place])   . Diabetes mellitus without complication ([place])   . Hypertension     History reviewed. No pertinent surgical history.  Social History Social History  Substance Use Topics  . Smoking status: Current Every Day Smoker    Packs/day: 1.50    Years: 45.00    Types: Cigarettes  . Smokeless tobacco: Never Used  . Alcohol use 0.0 oz/week     Comment: occasional (once every couple months)     Family History Family History  Problem Relation Age of Onset  . Cancer Mother   . Heart disease Father   No family history of bleeding/clotting disorders, porphyria or autoimmune disease  No Known Allergies   REVIEW OF SYSTEMS (Negative unless checked)  Constitutional: [] Weight loss  [] Fever  [] Chills Cardiac: [] Chest pain   [] Chest pressure   [] Palpitations   [] Shortness of breath when laying flat   [] Shortness of breath at rest   [] Shortness of breath with exertion. Vascular:  [] Pain in legs with walking   [] Pain in legs at rest   [] Pain in legs when laying flat   [] Claudication   [] Pain in feet when walking  [] Pain in feet at rest  [] Pain in feet when laying flat   [x] History of DVT   [] Phlebitis   [] Swelling in legs   [] Varicose veins   [] Non-healing ulcers Pulmonary:   [] Uses home oxygen   [] Productive cough   [] Hemoptysis   [] Wheeze  [] COPD   [] Asthma Neurologic:  [] Dizziness  [] Blackouts   [] Seizures   [x] History of stroke   [] History of TIA  [] Aphasia   X blindness   [] Dysphagia   [x] Weakness or numbness in arms   [x] Weakness or numbness in legs Musculoskeletal:  [] Arthritis   [] Joint swelling   [] Joint pain   [] Low back pain Hematologic:  [] Easy bruising  [] Easy bleeding   [] Hypercoagulable state   [] Anemic  [] Hepatitis Gastrointestinal:  [] Blood in stool   [] Vomiting blood  [] Gastroesophageal reflux/heartburn   [] Difficulty swallowing. Genitourinary:  [] Chronic kidney disease   [] Difficult urination  [] Frequent urination  [] Burning with urination   [] Blood in urine Skin:  [] Rashes   [] Ulcers   [] Wounds Psychological:  [] History of anxiety   []  History of major depression.  Physical Examination  Vitals:   08/24/16 1146 08/24/16 1329 08/24/16 1513 08/24/16 1533  BP: (!) 146/76 (!) 169/83 (!) 152/89 (!) 160/85  Pulse: 80 (!) 108 90 (!) 104  Resp: 16  16 16   Temp: 98.7 F (37.1 C)  98.7 F (37.1 C) (!) 100.4 F (38 C)  TempSrc: Oral  Oral Oral  SpO2: 92%  93% 90%   Weight:    93.9 kg (207 lb)  Height:    5\' 9"  (1.753 m)   Body mass index is 30.57 kg/m. Gen:  WD/WN, NAD Head: Coin/AT, No temporalis wasting. Prominent temp pulse not noted. Ear/Nose/Throat: Hearing grossly intact, nares w/o erythema or drainage, oropharynx w/o Erythema/Exudate Eyes: Sclera non-icteric, conjunctiva clear Neck: Trachea midline.  No JVD.  Pulmonary:  Good air movement, respirations not labored, equal bilaterally.  Cardiac: RRR,  normal S1, S2. Vascular: Bilateral carotid bruits noted Vessel Right Left  Radial Palpable Palpable  Ulnar Palpable Palpable  Brachial Palpable Palpable  Carotid Palpable, without bruit Palpable, without bruit  Aorta Not palpable N/A  Femoral Palpable Palpable  Popliteal Palpable Palpable  PT Palpable Palpable  DP Palpable Palpable   Gastrointestinal: soft, non-tender/non-distended. No guarding/reflex.  Musculoskeletal: M/S 5/5 throughoutOn the right however the left arm and leg are flaccid.  Extremities without ischemic changes.  No deformity or atrophy. No edema. Neurologic: Sensation grossly intact in extremities.  Symmetrical.  Speech is fluent. Motor exam as listed above. Psychiatric: Judgment intact, Mood & affect appropriate for pt's clinical situation. Dermatologic: No rashes or ulcers noted.  No cellulitis or open wounds. Lymph : No Cervical, Axillary, or Inguinal lymphadenopathy.      CBC Lab Results  Component Value Date   WBC 17.5 (H) 08/22/2016   HGB 19.0 (H) 08/22/2016   HCT 54.4 (H) 08/22/2016   MCV 95.8 08/22/2016   PLT 161 08/22/2016    BMET    Component Value Date/Time   NA 138 08/22/2016 0147   K 4.1 08/22/2016 0147   CL 103 08/22/2016 0147   CO2 24 08/22/2016 0147   GLUCOSE 271 (H) 08/22/2016 0147   BUN 19 08/22/2016 0147   CREATININE 0.95 08/22/2016 0147   CALCIUM 9.3 08/22/2016 0147   GFRNONAA >60 08/22/2016 0147   GFRAA >60 08/22/2016 0147   Estimated Creatinine Clearance: 92.4 mL/min (by C-G  formula based on SCr of 0.95 mg/dL).  COAG Lab Results  Component Value Date   INR 1.00 08/21/2016    Radiology Dg Chest 1 View  Result Date: 08/23/2016 CLINICAL DATA:  Short of breath EXAM: CHEST 1 VIEW COMPARISON:  None. FINDINGS: Normal heart size. Lungs clear. No pneumothorax. No pleural effusion. IMPRESSION: No active disease. Electronically Signed   By: Marybelle Killings M.D.   On: 08/23/2016 07:44   Ct Head Wo Contrast  Result Date: 08/21/2016 CLINICAL DATA:  Patient found down. Unable to move left arm or leg, acute onset. Initial encounter. EXAM: CT HEAD WITHOUT CONTRAST TECHNIQUE: Contiguous axial images were obtained from the base of the skull through the vertex without intravenous contrast. COMPARISON:  None. FINDINGS: Brain: There is an evolving acute infarct at the right parietal and temporal lobes, and right basal ganglia. There is mild mass-effect on the frontal horn of the right lateral ventricle. No midline shift is seen. There is no evidence of hemorrhagic transformation at this time. There is no evidence of hydrocephalus or extra-axial collection. No mass lesion is seen. The posterior fossa, including the cerebellum, brainstem and fourth ventricle, is within normal limits. The third and lateral ventricles are unremarkable in appearance. Vascular: No hyperdense vessel or unexpected calcification. Skull: There appears to be a minimally displaced fracture through the distal tip of the nasal bone, of indeterminate age. Sinuses/Orbits: The orbits are within normal limits. The paranasal sinuses and mastoid air cells are well-aerated. Other: No significant soft tissue abnormalities are seen. IMPRESSION: 1. Evolving acute infarct at the right parietal and temporal lobes, and right basal ganglia. Mild mass-effect on the frontal horn of the right lateral ventricle. No midline shift seen. No evidence of hemorrhagic transformation at this time. 2. Minimally displaced fracture through the distal tip of  the nasal bone, of indeterminate age. These results were called by telephone at the time of interpretation on 08/21/2016 at 9:47 pm to Dr. Nance Pear , who verbally acknowledged these results. Electronically Signed  By: Garald Balding M.D.   On: 08/21/2016 21:48   Mr Brain Wo Contrast  Result Date: 08/22/2016 CLINICAL DATA:  Fall with left-sided weakness. Right MCA infarct on CT yesterday. EXAM: MRI HEAD WITHOUT CONTRAST MRA HEAD WITHOUT CONTRAST TECHNIQUE: Multiplanar, multiecho pulse sequences of the brain and surrounding structures were obtained without intravenous contrast. Angiographic images of the head were obtained using MRA technique without contrast. COMPARISON:  Head CT 08/21/2016 FINDINGS: MRI HEAD FINDINGS Brain: There is a large right MCA territory infarct diffusely involving the basal ganglia, adjacent deep white matter tracts, and portions of the temporal, frontal, and parietal lobes. The degree of diffusion restriction and the degree of cytotoxic edema in the infarct is in variable suggesting ischemia of varying age, with a more early subacute appearance of the basal ganglia infarcts and a more acute appearance of much of the cortical/subcortical infarction. There is a small amount of associated petechial hemorrhage. Two punctate foci of acute cortical infarction are present more posteriorly near the temporo occipital junction. A chronic, small cortical and subcortical infarction is present in the high posterior right frontal lobe. There is no evidence mass, midline shift, or extra-axial fluid collection. There is effacement of the right frontal horn due to the basal ganglia edema. Vascular: Abnormal appearance of the right ICA and right MCA, more fully evaluated below. Skull and upper cervical spine: Unremarkable bone marrow signal. Sinuses/Orbits: Unremarkable orbits. Paranasal sinuses and mastoid air cells are clear. Other: None. MRA HEAD FINDINGS The visualized distal vertebral arteries  are patent with the left being dominant. The basilar artery is widely patent. Right AICA and bilateral SCA is are visualized. There is a small right posterior communicating artery. PCAs are patent without evidence of significant stenosis. Lack of flow related enhancement in the distal cervical and intracranial segments of the right ICA consistent with occlusion. There is reconstitution of the right ICA terminus via the posterior communicating artery. There is proximal right M1 MCA occlusion. The right A1 segment is patent though mildly hypoplastic in appearance diffusely. The intracranial left ICA is widely patent. The left ACA and left MCA are also patent without evidence of significant proximal stenosis. No intracranial aneurysm is identified. IMPRESSION: 1. Acute to early subacute large right MCA infarct. 2. 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 3. Small chronic right frontal infarct. These results were called by telephone at the time of interpretation on 08/22/2016 at 1:35 pm to Dr. Dustin Flock, who verbally acknowledged these results. Electronically Signed   By: Logan Bores M.D.   On: 08/22/2016 13:46   US Carotid Bilateral (at [facility] Only)  Result Date: 08/22/2016 CLINICAL DATA:  Cerebral infarction. EXAM: BILATERAL CAROTID DUPLEX ULTRASOUND TECHNIQUE: Pearline Cables scale imaging, color Doppler and duplex ultrasound were performed of bilateral carotid and vertebral arteries in the neck. COMPARISON:  None. FINDINGS: Criteria: Quantification of carotid stenosis is based on velocity parameters that correlate the residual internal carotid diameter with NASCET-based stenosis levels, using the diameter of the distal internal carotid lumen as the denominator for stenosis measurement. The following velocity measurements were obtained: RIGHT ICA:  Flow not detectable. CCA:  123456 cm/sec SYSTOLIC ICA/CCA RATIO:  N/A DIASTOLIC ICA/CCA RATIO:  N/A ECA:  225 cm/sec LEFT ICA:  43/145 cm/sec CCA:   [id number] cm/sec SYSTOLIC ICA/CCA RATIO:  4.5 DIASTOLIC ICA/CCA RATIO:  [id number] ECA:  310 cm/sec RIGHT CAROTID ARTERY: There is a prominent amount of partially calcified plaque at the level of the carotid bulb.  Large amount of plaque and thrombus is identified in the internal carotid artery with no detectable color Doppler flow beyond the ICA origin. RIGHT VERTEBRAL ARTERY: Antegrade flow with normal waveform and velocity. LEFT CAROTID ARTERY: There is a significant amount of partially calcified plaque at the level of the carotid bulb extending into internal and external carotid arteries. Proximal left ICA waveforms show spectral broadening and significant velocity elevation consistent with a greater than 70% stenosis. LEFT VERTEBRAL ARTERY: Antegrade flow with normal waveform and velocity. IMPRESSION: 1. 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 2. Significant plaque at the level of the left carotid bulb and proximal ICA causing significant proximal left ICA stenosis. Estimated stenosis is greater than 70%. Electronically Signed   By: Aletta Edouard M.D.   On: 08/22/2016 13:42   US Venous Img Lower Unilateral Left  Result Date: 08/24/2016 CLINICAL DATA:  Recent LEFT lower extremity deep venous thrombosis. LEFT-sided weakness. EXAM: LOWER EXTREMITY VENOUS DOPPLER ULTRASOUND TECHNIQUE: Gray-scale sonography with graded compression, as well as color Doppler and duplex ultrasound were performed to evaluate the lower extremity deep venous systems from the level of the common femoral vein and including the common femoral, femoral, profunda femoral, popliteal and calf veins including the posterior tibial, peroneal and gastrocnemius veins when visible. The superficial great saphenous vein was also interrogated. Spectral Doppler was utilized to evaluate flow at rest and with distal augmentation maneuvers in the common femoral, femoral  and popliteal veins. COMPARISON:  None. FINDINGS: Contralateral Common Femoral Vein: Respiratory phasicity is normal and symmetric with the symptomatic side. No evidence of thrombus. Normal compressibility. Common Femoral Vein: Nonocclusive thrombus.  Non compressible Saphenofemoral Junction: No evidence of thrombus. Normal compressibility and flow on color Doppler imaging. Profunda Femoral Vein: No evidence of thrombus. Normal compressibility and flow on color Doppler imaging. Femoral Vein: Nonocclusive thrombus.  Non compressible. Popliteal Vein: No evidence of thrombus. Normal compressibility, respiratory phasicity and response to augmentation. Calf Veins: No evidence of thrombus. Normal compressibility and flow on color Doppler imaging. Superficial Great Saphenous Vein: No evidence of thrombus. Normal compressibility and flow on color Doppler imaging. IMPRESSION: Nonocclusive thrombosis within the LEFT common femoral vein and femoral vein. Decreased clot burden to ultrasound 05/04/2016. No new thrombus present. Electronically Signed   By: Suzy Bouchard M.D.   On: 08/24/2016 12:19   Mr Jodene Nam Head/brain [id number] Cm  Result Date: 08/22/2016 CLINICAL DATA:  Fall with left-sided weakness. Right MCA infarct on CT yesterday. EXAM: MRI HEAD WITHOUT CONTRAST MRA HEAD WITHOUT CONTRAST TECHNIQUE: Multiplanar, multiecho pulse sequences of the brain and surrounding structures were obtained without intravenous contrast. Angiographic images of the head were obtained using MRA technique without contrast. COMPARISON:  Head CT 08/21/2016 FINDINGS: MRI HEAD FINDINGS Brain: There is a large right MCA territory infarct diffusely involving the basal ganglia, adjacent deep white matter tracts, and portions of the temporal, frontal, and parietal lobes. The degree of diffusion restriction and the degree of cytotoxic edema in the infarct is in variable suggesting ischemia of varying age, with a more early subacute appearance of the basal  ganglia infarcts and a more acute appearance of much of the cortical/subcortical infarction. There is a small amount of associated petechial hemorrhage. Two punctate foci of acute cortical infarction are present more posteriorly near the temporo occipital junction. A chronic, small cortical and subcortical infarction is present in the high posterior right frontal lobe. There is no  evidence mass, midline shift, or extra-axial fluid collection. There is effacement of the right frontal horn due to the basal ganglia edema. Vascular: Abnormal appearance of the right ICA and right MCA, more fully evaluated below. Skull and upper cervical spine: Unremarkable bone marrow signal. Sinuses/Orbits: Unremarkable orbits. Paranasal sinuses and mastoid air cells are clear. Other: None. MRA HEAD FINDINGS The visualized distal vertebral arteries are patent with the left being dominant. The basilar artery is widely patent. Right AICA and bilateral SCA is are visualized. There is a small right posterior communicating artery. PCAs are patent without evidence of significant stenosis. Lack of flow related enhancement in the distal cervical and intracranial segments of the right ICA consistent with occlusion. There is reconstitution of the right ICA terminus via the posterior communicating artery. There is proximal right M1 MCA occlusion. The right A1 segment is patent though mildly hypoplastic in appearance diffusely. The intracranial left ICA is widely patent. The left ACA and left MCA are also patent without evidence of significant proximal stenosis. No intracranial aneurysm is identified. IMPRESSION: 1. Acute to early subacute large right MCA infarct. 2. 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 3. Small chronic right frontal infarct. These results were called by telephone at the time of interpretation on 08/22/2016 at 1:35 pm to Dr. Dustin Flock, who verbally acknowledged these results. Electronically Signed   By:  Logan Bores M.D.   On: 08/22/2016 13:46      Assessment/Plan 1. DVT left lower extremity:  The patient is approximate 4 months status post DVT follow-up ultrasound today shows continued thrombus burden. Given these findings he should be continued on anticoagulation. However in light of his acute CVA anticoagulation represents an unreasonable risk for hemorrhagic conversion and therefore IVC filter is indicated. The risks and benefits of been reviewed patient has agrees to proceed. Filter will be placed today so that we can continue with his discharge to: Rehabilitation. 2. Critical carotid stenosis:  The patient has an occlusion of his right internal carotid with a greater than 70% stenosis of the left internal carotid. Given the large stroke no intervention will be planned at this time. He should follow-up in the office with May in approximately 8 weeks at which time treatment of his critical left internal carotid artery stenosis can be planned. Given the circumstances I would favor stenting over endarterectomy. CT angiogram will be obtained to better demonstrate the anatomy and ensure that stenting would be the optimal therapy. 3. Right hemispheric stroke:  Patient will continue Plavix and aspirin for now. Ellik was can be restarted at a later date. 4.  Coronary artery disease: Patient has a normal ejection fraction however there are changes on echo consistent with coronary artery disease. This can be further evaluated as an outpatient prior to his carotid intervention. In the meantime he will be maintained on cardiac monitoring. Nitrates will be given when necessary for chest pain or anginal symptoms   [name], MD  08/24/2016 3:53 PM    This note was created with Dragon medical transcription system.  Any error is purely unintentional

## 2016-08-24 NOTE — Clinical Social Work Note (Signed)
Pt has been accept to [facility] and may discharge today. Pt's son is in agreement with discharge plan and has obtained 24 hour supervision after rehab. RNCM will be following for discharge planning needs. CSW is signing off as no further needs identified.   Darden Dates, MSW, LCSW  Clinical Social Worker  [phone]

## 2016-08-24 NOTE — Progress Notes (Signed)
Patient noted on the floor after bed alarm sounded, VS WNL, small abrasion noted to left knee, no injury to head noted,  MD notified, family notified, patient re-education about safety plan.

## 2016-08-24 NOTE — Op Note (Signed)
[facility]   OPERATIVE NOTE    PRE-OPERATIVE DIAGNOSIS: DVT with PE  POST-OPERATIVE DIAGNOSIS: Same  PROCEDURE: 1.   Ultrasound guidance for vascular access to the right vein 2.   Catheter placement into the inferior vena cava 3.   Inferior venacavogram 4.   Placement of a Denali IVC filter  SURGEON: [name]  ASSISTANT(S): None  ANESTHESIA: Conscious sedation was administered under my direct supervision. IV Versed plus fentanyl were utilized. Continuous ECG, pulse oximetry and blood pressure was monitored throughout the entire procedure. Conscious sedation was for a total of 20 minutes.  ESTIMATED BLOOD LOSS: minimal  FINDING(S): 1.  Patent IVC  SPECIMEN(S):  none  INDICATIONS:   Chad Combs is a 61 y.o. y.o. 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  Inferior vena cava filter is indicated for this reason.  Risks and benefits including filter thrombosis, migration, fracture, bleeding, and infection were all discussed.  We discussed that all IVC filters that we place can be removed if desired from the patient once the need for the filter has passed.    DESCRIPTION: After obtaining full informed written consent, the patient was brought back to the vascular suite. The skin was sterilely prepped and draped in a sterile surgical field was created. The right common femoral was accessed under direct ultrasound guidance without difficulty with a Seldinger needle and a J-wire was then placed. The dilator is passed over the wire and the delivery sheath was placed into the inferior vena cava.  Inferior venacavogram was performed. This demonstrated a patent IVC with the level of the renal veins at L1.  The filter was then deployed into the inferior vena cava at the level of L2 just below the renal veins. The delivery sheath was then removed. Pressure was held. Sterile dressings were placed. The patient  tolerated the procedure well and was taken to the recovery room in stable condition.  Interpretation: IVC is widely patent. It measures 24 mm in diameter. Denali filter is deployed in excellent orientation.  COMPLICATIONS: None  CONDITION: Stable  [name]  08/24/2016, 4:27 PM

## 2016-08-25 ENCOUNTER — Encounter: Payer: Self-pay | Admitting: Vascular Surgery

## 2016-08-25 ENCOUNTER — Inpatient Hospital Stay (HOSPITAL_COMMUNITY)
Admission: RE | Admit: 2016-08-25 | Discharge: 2016-10-01 | DRG: 056 | Disposition: A | Discharge status: Skilled Nursing Facility | Payer: Self-pay | Source: Intra-hospital | Attending: Physical Medicine & Rehabilitation | Admitting: Physical Medicine & Rehabilitation

## 2016-08-25 DIAGNOSIS — I639 Cerebral infarction, unspecified: Secondary | ICD-10-CM

## 2016-08-25 DIAGNOSIS — H5461 Unqualified visual loss, right eye, normal vision left eye: Secondary | ICD-10-CM | POA: Diagnosis present

## 2016-08-25 DIAGNOSIS — I69354 Hemiplegia and hemiparesis following cerebral infarction affecting left non-dominant side: Principal | ICD-10-CM

## 2016-08-25 DIAGNOSIS — Z720 Tobacco use: Secondary | ICD-10-CM

## 2016-08-25 DIAGNOSIS — I513 Intracardiac thrombosis, not elsewhere classified: Secondary | ICD-10-CM | POA: Diagnosis present

## 2016-08-25 DIAGNOSIS — R4189 Other symptoms and signs involving cognitive functions and awareness: Secondary | ICD-10-CM | POA: Diagnosis present

## 2016-08-25 DIAGNOSIS — F4322 Adjustment disorder with anxiety: Secondary | ICD-10-CM | POA: Diagnosis present

## 2016-08-25 DIAGNOSIS — I69392 Facial weakness following cerebral infarction: Secondary | ICD-10-CM

## 2016-08-25 DIAGNOSIS — I69322 Dysarthria following cerebral infarction: Secondary | ICD-10-CM

## 2016-08-25 DIAGNOSIS — R338 Other retention of urine: Secondary | ICD-10-CM | POA: Diagnosis present

## 2016-08-25 DIAGNOSIS — G252 Other specified forms of tremor: Secondary | ICD-10-CM | POA: Diagnosis not present

## 2016-08-25 DIAGNOSIS — N401 Enlarged prostate with lower urinary tract symptoms: Secondary | ICD-10-CM | POA: Diagnosis present

## 2016-08-25 DIAGNOSIS — Z7984 Long term (current) use of oral hypoglycemic drugs: Secondary | ICD-10-CM

## 2016-08-25 DIAGNOSIS — Z7902 Long term (current) use of antithrombotics/antiplatelets: Secondary | ICD-10-CM

## 2016-08-25 DIAGNOSIS — Z86718 Personal history of other venous thrombosis and embolism: Secondary | ICD-10-CM

## 2016-08-25 DIAGNOSIS — I714 Abdominal aortic aneurysm, without rupture, unspecified: Secondary | ICD-10-CM

## 2016-08-25 DIAGNOSIS — Z8249 Family history of ischemic heart disease and other diseases of the circulatory system: Secondary | ICD-10-CM

## 2016-08-25 DIAGNOSIS — I6523 Occlusion and stenosis of bilateral carotid arteries: Secondary | ICD-10-CM | POA: Diagnosis present

## 2016-08-25 DIAGNOSIS — F17210 Nicotine dependence, cigarettes, uncomplicated: Secondary | ICD-10-CM | POA: Diagnosis present

## 2016-08-25 DIAGNOSIS — R1311 Dysphagia, oral phase: Secondary | ICD-10-CM | POA: Diagnosis present

## 2016-08-25 DIAGNOSIS — D72829 Elevated white blood cell count, unspecified: Secondary | ICD-10-CM | POA: Diagnosis present

## 2016-08-25 DIAGNOSIS — M25552 Pain in left hip: Secondary | ICD-10-CM

## 2016-08-25 DIAGNOSIS — I10 Essential (primary) hypertension: Secondary | ICD-10-CM

## 2016-08-25 DIAGNOSIS — I69391 Dysphagia following cerebral infarction: Secondary | ICD-10-CM

## 2016-08-25 DIAGNOSIS — F4320 Adjustment disorder, unspecified: Secondary | ICD-10-CM

## 2016-08-25 DIAGNOSIS — I6529 Occlusion and stenosis of unspecified carotid artery: Secondary | ICD-10-CM

## 2016-08-25 DIAGNOSIS — I723 Aneurysm of iliac artery: Secondary | ICD-10-CM | POA: Diagnosis present

## 2016-08-25 DIAGNOSIS — G8194 Hemiplegia, unspecified affecting left nondominant side: Secondary | ICD-10-CM

## 2016-08-25 DIAGNOSIS — E1140 Type 2 diabetes mellitus with diabetic neuropathy, unspecified: Secondary | ICD-10-CM | POA: Diagnosis present

## 2016-08-25 DIAGNOSIS — D45 Polycythemia vera: Secondary | ICD-10-CM | POA: Diagnosis present

## 2016-08-25 DIAGNOSIS — E119 Type 2 diabetes mellitus without complications: Secondary | ICD-10-CM

## 2016-08-25 DIAGNOSIS — Z7982 Long term (current) use of aspirin: Secondary | ICD-10-CM

## 2016-08-25 DIAGNOSIS — Z79899 Other long term (current) drug therapy: Secondary | ICD-10-CM

## 2016-08-25 DIAGNOSIS — Z7901 Long term (current) use of anticoagulants: Secondary | ICD-10-CM

## 2016-08-25 DIAGNOSIS — Z95828 Presence of other vascular implants and grafts: Secondary | ICD-10-CM

## 2016-08-25 DIAGNOSIS — E1159 Type 2 diabetes mellitus with other circulatory complications: Secondary | ICD-10-CM

## 2016-08-25 DIAGNOSIS — I2699 Other pulmonary embolism without acute cor pulmonale: Secondary | ICD-10-CM | POA: Diagnosis present

## 2016-08-25 DIAGNOSIS — E785 Hyperlipidemia, unspecified: Secondary | ICD-10-CM | POA: Diagnosis present

## 2016-08-25 DIAGNOSIS — Z9114 Patient's other noncompliance with medication regimen: Secondary | ICD-10-CM

## 2016-08-25 DIAGNOSIS — J449 Chronic obstructive pulmonary disease, unspecified: Secondary | ICD-10-CM | POA: Diagnosis present

## 2016-08-25 DIAGNOSIS — M25559 Pain in unspecified hip: Secondary | ICD-10-CM

## 2016-08-25 DIAGNOSIS — F419 Anxiety disorder, unspecified: Secondary | ICD-10-CM | POA: Diagnosis present

## 2016-08-25 DIAGNOSIS — I619 Nontraumatic intracerebral hemorrhage, unspecified: Secondary | ICD-10-CM | POA: Diagnosis not present

## 2016-08-25 DIAGNOSIS — I82412 Acute embolism and thrombosis of left femoral vein: Secondary | ICD-10-CM

## 2016-08-25 DIAGNOSIS — E8809 Other disorders of plasma-protein metabolism, not elsewhere classified: Secondary | ICD-10-CM | POA: Diagnosis present

## 2016-08-25 DIAGNOSIS — G936 Cerebral edema: Secondary | ICD-10-CM | POA: Diagnosis not present

## 2016-08-25 DIAGNOSIS — I63511 Cerebral infarction due to unspecified occlusion or stenosis of right middle cerebral artery: Secondary | ICD-10-CM | POA: Diagnosis present

## 2016-08-25 DIAGNOSIS — M25551 Pain in right hip: Secondary | ICD-10-CM

## 2016-08-25 LAB — TROPONIN I
Troponin I: <0.03 ng/mL (ref <0.03)
Troponin I: <0.03 ng/mL (ref <0.03)

## 2016-08-25 LAB — GLUCOSE, CAPILLARY
GLUCOSE-CAPILLARY: 239 mg/dL — AB (ref 65–99)
Glucose-Capillary: 237 mg/dL — ABNORMAL HIGH (ref 65–99)
Glucose-Capillary: 251 mg/dL — ABNORMAL HIGH (ref 65–99)
Glucose-Capillary: 213 mg/dL — ABNORMAL HIGH (ref 65–99)

## 2016-08-25 MED ORDER — ACETAMINOPHEN 325 MG PO TABS
325.0000 mg | ORAL_TABLET | ORAL | Status: DC | PRN
  Administered 2016-08-26 – 2016-09-30 (×10): 650 mg via ORAL
  Filled 2016-08-25 (×10): qty 2

## 2016-08-25 MED ORDER — CLOPIDOGREL BISULFATE 75 MG PO TABS: 75.0000 mg | ORAL_TABLET | Freq: Every day | ORAL | 0 refills | Status: DC

## 2016-08-25 MED ORDER — POLYETHYLENE GLYCOL 3350 17 G PO PACK
17.0000 g | PACK | Freq: Every day | ORAL | Status: DC | PRN
  Administered 2016-09-11 – 2016-09-18 (×3): 17 g via ORAL
  Filled 2016-08-25 (×4): qty 1

## 2016-08-25 MED ORDER — GUAIFENESIN-DM 100-10 MG/5ML PO SYRP: 5.0000 mL | ORAL_SOLUTION | Freq: Four times a day (QID) | ORAL | Status: DC | PRN

## 2016-08-25 MED ORDER — PROCHLORPERAZINE 25 MG RE SUPP: 12.5000 mg | Freq: Four times a day (QID) | RECTAL | Status: DC | PRN

## 2016-08-25 MED ORDER — INSULIN GLARGINE 100 UNIT/ML ~~LOC~~ SOLN
20.0000 [IU] | Freq: Every day | SUBCUTANEOUS | Status: DC
  Administered 2016-08-26 – 2016-08-27 (×2): 20 [IU] via SUBCUTANEOUS
  Filled 2016-08-25 (×3): qty 0.2

## 2016-08-25 MED ORDER — NICOTINE 21 MG/24HR TD PT24
21.0000 mg | MEDICATED_PATCH | Freq: Every day | TRANSDERMAL | Status: DC
  Administered 2016-08-26 – 2016-09-22 (×28): 21 mg via TRANSDERMAL
  Filled 2016-08-25 (×28): qty 1

## 2016-08-25 MED ORDER — ENOXAPARIN SODIUM 40 MG/0.4ML ~~LOC~~ SOLN
40.0000 mg | SUBCUTANEOUS | Status: DC
  Administered 2016-08-26 – 2016-09-13 (×19): 40 mg via SUBCUTANEOUS
  Filled 2016-08-25 (×19): qty 0.4

## 2016-08-25 MED ORDER — SENNOSIDES-DOCUSATE SODIUM 8.6-50 MG PO TABS
1.0000 | ORAL_TABLET | Freq: Every evening | ORAL | Status: DC | PRN
  Administered 2016-09-26: 1 via ORAL
  Filled 2016-08-25 (×2): qty 1

## 2016-08-25 MED ORDER — ASPIRIN 81 MG PO CHEW: 81.0000 mg | CHEWABLE_TABLET | Freq: Every day | ORAL | 0 refills | Status: DC

## 2016-08-25 MED ORDER — TRAZODONE HCL 50 MG PO TABS
25.0000 mg | ORAL_TABLET | Freq: Every evening | ORAL | Status: DC | PRN
  Administered 2016-08-25 – 2016-09-29 (×23): 50 mg via ORAL
  Filled 2016-08-25 (×23): qty 1

## 2016-08-25 MED ORDER — METFORMIN HCL 500 MG PO TABS
1000.0000 mg | ORAL_TABLET | Freq: Two times a day (BID) | ORAL | Status: DC
  Administered 2016-08-25 – 2016-10-01 (×73): 1000 mg via ORAL
  Filled 2016-08-25 (×76): qty 2

## 2016-08-25 MED ORDER — PROCHLORPERAZINE MALEATE 5 MG PO TABS
5.0000 mg | ORAL_TABLET | Freq: Four times a day (QID) | ORAL | Status: DC | PRN
  Administered 2016-09-16: 5 mg via ORAL
  Filled 2016-08-25: qty 1

## 2016-08-25 MED ORDER — CLOPIDOGREL BISULFATE 75 MG PO TABS
75.0000 mg | ORAL_TABLET | Freq: Every day | ORAL | Status: DC
  Administered 2016-08-26 – 2016-09-13 (×19): 75 mg via ORAL
  Filled 2016-08-25 (×19): qty 1

## 2016-08-25 MED ORDER — FLEET ENEMA 7-19 GM/118ML RE ENEM
1.0000 | ENEMA | Freq: Once | RECTAL | Status: DC | PRN
  Filled 2016-08-25: qty 1

## 2016-08-25 MED ORDER — ALUM & MAG HYDROXIDE-SIMETH 200-200-20 MG/5ML PO SUSP
30.0000 mL | ORAL | Status: DC | PRN
  Administered 2016-08-30 – 2016-09-19 (×10): 30 mL via ORAL
  Filled 2016-08-25 (×10): qty 30

## 2016-08-25 MED ORDER — ASPIRIN 81 MG PO CHEW
81.0000 mg | CHEWABLE_TABLET | Freq: Every day | ORAL | Status: DC
  Administered 2016-08-26 – 2016-09-15 (×21): 81 mg via ORAL
  Filled 2016-08-25 (×21): qty 1

## 2016-08-25 MED ORDER — BISACODYL 10 MG RE SUPP: 10.0000 mg | Freq: Every day | RECTAL | Status: DC | PRN

## 2016-08-25 MED ORDER — FLUTICASONE PROPIONATE 50 MCG/ACT NA SUSP
1.0000 | Freq: Every day | NASAL | Status: DC
  Administered 2016-08-25 – 2016-10-01 (×37): 1 via NASAL
  Filled 2016-08-25 (×2): qty 16

## 2016-08-25 MED ORDER — GABAPENTIN 100 MG PO CAPS
100.0000 mg | ORAL_CAPSULE | Freq: Two times a day (BID) | ORAL | Status: DC | PRN
  Administered 2016-09-01 – 2016-09-07 (×6): 100 mg via ORAL
  Filled 2016-08-25 (×6): qty 1

## 2016-08-25 MED ORDER — ATORVASTATIN CALCIUM 40 MG PO TABS
40.0000 mg | ORAL_TABLET | Freq: Every day | ORAL | Status: DC
  Administered 2016-08-25 – 2016-09-30 (×36): 40 mg via ORAL
  Filled 2016-08-25 (×36): qty 1

## 2016-08-25 MED ORDER — ATORVASTATIN CALCIUM 40 MG PO TABS: 40.0000 mg | ORAL_TABLET | Freq: Every day | ORAL | 0 refills | Status: DC

## 2016-08-25 MED ORDER — INSULIN ASPART 100 UNIT/ML ~~LOC~~ SOLN
0.0000 [IU] | Freq: Three times a day (TID) | SUBCUTANEOUS | Status: DC
  Administered 2016-08-25: 7 [IU] via SUBCUTANEOUS
  Administered 2016-08-25: 11 [IU] via SUBCUTANEOUS
  Administered 2016-08-26: 4 [IU] via SUBCUTANEOUS
  Administered 2016-08-26 – 2016-08-27 (×5): 7 [IU] via SUBCUTANEOUS
  Administered 2016-08-27: 11 [IU] via SUBCUTANEOUS
  Administered 2016-08-27 – 2016-08-28 (×4): 4 [IU] via SUBCUTANEOUS
  Administered 2016-08-28: 11 [IU] via SUBCUTANEOUS
  Administered 2016-08-29: 3 [IU] via SUBCUTANEOUS
  Administered 2016-08-29: 4 [IU] via SUBCUTANEOUS
  Administered 2016-08-29: 3 [IU] via SUBCUTANEOUS
  Administered 2016-08-29 – 2016-08-30 (×2): 4 [IU] via SUBCUTANEOUS
  Administered 2016-08-30: 3 [IU] via SUBCUTANEOUS
  Administered 2016-08-30: 4 [IU] via SUBCUTANEOUS
  Administered 2016-08-31: 3 [IU] via SUBCUTANEOUS
  Administered 2016-08-31: 4 [IU] via SUBCUTANEOUS
  Administered 2016-08-31: 7 [IU] via SUBCUTANEOUS
  Administered 2016-09-01 (×3): 4 [IU] via SUBCUTANEOUS
  Administered 2016-09-01: 11 [IU] via SUBCUTANEOUS
  Administered 2016-09-02: 3 [IU] via SUBCUTANEOUS
  Administered 2016-09-02: 7 [IU] via SUBCUTANEOUS
  Administered 2016-09-02 – 2016-09-03 (×3): 4 [IU] via SUBCUTANEOUS
  Administered 2016-09-03 – 2016-09-04 (×3): 3 [IU] via SUBCUTANEOUS
  Administered 2016-09-04 – 2016-09-05 (×2): 4 [IU] via SUBCUTANEOUS
  Administered 2016-09-05 – 2016-09-06 (×4): 3 [IU] via SUBCUTANEOUS
  Administered 2016-09-06: 7 [IU] via SUBCUTANEOUS
  Administered 2016-09-07 (×2): 4 [IU] via SUBCUTANEOUS
  Administered 2016-09-07 – 2016-09-08 (×4): 3 [IU] via SUBCUTANEOUS
  Administered 2016-09-08 – 2016-09-09 (×2): 4 [IU] via SUBCUTANEOUS
  Administered 2016-09-09: 3 [IU] via SUBCUTANEOUS
  Administered 2016-09-10 – 2016-09-11 (×2): 4 [IU] via SUBCUTANEOUS
  Administered 2016-09-12: 3 [IU] via SUBCUTANEOUS
  Administered 2016-09-12 – 2016-09-15 (×4): 4 [IU] via SUBCUTANEOUS
  Administered 2016-09-16 – 2016-09-17 (×3): 3 [IU] via SUBCUTANEOUS
  Administered 2016-09-17: 4 [IU] via SUBCUTANEOUS
  Administered 2016-09-17 – 2016-09-25 (×13): 3 [IU] via SUBCUTANEOUS
  Administered 2016-09-26: 4 [IU] via SUBCUTANEOUS
  Administered 2016-09-27 – 2016-09-29 (×4): 3 [IU] via SUBCUTANEOUS
  Administered 2016-09-30: 4 [IU] via SUBCUTANEOUS
  Administered 2016-10-01: 3 [IU] via SUBCUTANEOUS

## 2016-08-25 MED ORDER — LISINOPRIL 20 MG PO TABS
20.0000 mg | ORAL_TABLET | Freq: Every day | ORAL | Status: DC
  Administered 2016-08-26 – 2016-09-22 (×26): 20 mg via ORAL
  Filled 2016-08-25 (×28): qty 1

## 2016-08-25 MED ORDER — INFLUENZA VAC SPLIT QUAD 0.5 ML IM SUSY
0.5000 mL | PREFILLED_SYRINGE | INTRAMUSCULAR | Status: AC
  Administered 2016-08-26: 0.5 mL via INTRAMUSCULAR

## 2016-08-25 MED ORDER — MOMETASONE FURO-FORMOTEROL FUM 100-5 MCG/ACT IN AERO
2.0000 | INHALATION_SPRAY | Freq: Two times a day (BID) | RESPIRATORY_TRACT | Status: DC
  Administered 2016-08-25 – 2016-10-01 (×65): 2 via RESPIRATORY_TRACT
  Filled 2016-08-25 (×4): qty 8.8

## 2016-08-25 MED ORDER — PROCHLORPERAZINE EDISYLATE 5 MG/ML IJ SOLN: 5.0000 mg | Freq: Four times a day (QID) | INTRAMUSCULAR | Status: DC | PRN

## 2016-08-25 MED ORDER — DIPHENHYDRAMINE HCL 12.5 MG/5ML PO ELIX: 12.5000 mg | ORAL_SOLUTION | Freq: Four times a day (QID) | ORAL | Status: DC | PRN

## 2016-08-25 MED ORDER — FLUTICASONE PROPIONATE 50 MCG/ACT NA SUSP
1.0000 | Freq: Every day | NASAL | 2 refills | Status: DC
Start: 2016-08-25 — End: 2020-01-24

## 2016-08-25 MED ORDER — PNEUMOCOCCAL VAC POLYVALENT 25 MCG/0.5ML IJ INJ
0.5000 mL | INJECTION | INTRAMUSCULAR | Status: AC
Start: 2016-08-26 — End: 2016-08-26
  Administered 2016-08-26: 0.5 mL via INTRAMUSCULAR
  Filled 2016-08-25: qty 0.5

## 2016-08-25 NOTE — Progress Notes (Signed)
Patient being transferred today to [facility].  IVC filter placed yesterday.  Pt had been taking elequis for DVT prevention and unable to afford continuing to take.  Pt had acute infarct to R parietal and temporal lobes and R basal ganglia.  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 Pt is post fall on 3/5.  He was trying to get up to  Go to [facility].  Has complete Left side neglect.  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    BM today after administering dulcolax suppository.  Facial droop has improved slightly and L leg moved in response to touching but he couldn't make leg move. Son, Will is at bedside.  [facility] transporting patient.

## 2016-08-25 NOTE — Progress Notes (Signed)
[facility] inpatient rehab admissions - Admission was cancelled yesterday due to late placement of IVC filter.  Patient does have a bed available today and will admit to [facility] inpatient rehab today.  Call me for questions.  #[phone]

## 2016-08-25 NOTE — Discharge Summary (Signed)
[facility] [place] NAME: Chad Combs    MR#:  [id number]  DATE OF BIRTH:  09-Aug-1955  DATE OF ADMISSION:  08/21/2016   ADMITTING PHYSICIAN: Harvie Bridge, DO  DATE OF DISCHARGE: 08/25/2016  PRIMARY CARE PHYSICIAN: [facility]   ADMISSION DIAGNOSIS:  Left-sided weakness [R53.1] Cerebrovascular accident (CVA), unspecified mechanism ([place]) [I63.9] DISCHARGE DIAGNOSIS:  Active Problems:   CVA (cerebral vascular accident) ([place])  SECONDARY DIAGNOSIS:   Past Medical History:  Diagnosis Date  . Blind right eye   . COPD (chronic obstructive pulmonary disease) ([place])   . Diabetes mellitus without complication ([place])   . Hypertension    HOSPITAL COURSE:  61 y.o.malewith a history of COPD, DM, HTNnow being admitted with:  1. Acute CVA - acute infarct at the right parietal and temporal lobes, and right basal ganglia - MRI Brain shows large right MCA territory CVA - MRA of the brain shows occlusion of right ICA and proximal MCA -Continue therapy with aspirin - Hold eliquis per Neurology rec to avoid hemorrhagic conversion -Being discharged to [facility] re[facility]  2. Hyperkalemia Now resolved  3. Leukocytosis / Erythrocytosis, no evidence of active infection - chest x-ray and urinalysis was nonrevealing  4. Elevated LFTs, mild Likely dueto fatty liver  5. H/o COPD - Continue Advair  6. H/O DM with hyperglycemia - resume metformin  7. H/o HTN - Continue Lisinopril  8. Left LE DVT in November 2017. Pt has been non compliant with eliquis due to financial constraint. -Per discussion with neurology he is not a candidate for Eliquis due to risk for hemorrhagic transformation of stroke -Repeat US LE shows persistent non occlusive DVT in the left LE - s/p IVC filter placed DISCHARGE CONDITIONS:  stable CONSULTS OBTAINED:  Treatment Team:  Leotis Pain, MD Algernon Huxley, MD DRUG ALLERGIES:    No Known Allergies DISCHARGE MEDICATIONS:   Allergies as of 08/25/2016   No Known Allergies     Medication List    STOP taking these medications   apixaban 5 MG Tabs tablet Commonly known as:  ELIQUIS     TAKE these medications   aspirin 81 MG chewable tablet Chew 1 tablet (81 mg total) by mouth daily. Start taking on:  08/26/2016   atorvastatin 40 MG tablet Commonly known as:  LIPITOR Take 1 tablet (40 mg total) by mouth daily at 6 PM.   clopidogrel 75 MG tablet Commonly known as:  PLAVIX Take 1 tablet (75 mg total) by mouth daily. Start taking on:  08/26/2016   fluticasone 50 MCG/ACT nasal spray Commonly known as:  FLONASE Place 1 spray into both nostrils daily.   Fluticasone-Salmeterol 100-50 MCG/DOSE Aepb Commonly known as:  ADVAIR Inhale 1 puff into the lungs 2 (two) times daily.   gabapentin 100 MG capsule Commonly known as:  NEURONTIN Take 100 mg by mouth 2 (two) times daily as needed.   lisinopril 20 MG tablet Commonly known as:  PRINIVIL,ZESTRIL Take 20 mg by mouth daily.   metFORMIN 500 MG tablet Commonly known as:  GLUCOPHAGE Take 1,000 mg by mouth 2 (two) times daily.      DISCHARGE INSTRUCTIONS:   DIET:  Cardiac diet - DIET DYS 3 Room service appropriate - Yes with Assist; Fluid consistency: Thin DISCHARGE CONDITION:  Stable ACTIVITY:  Activity as tolerated OXYGEN:  Home Oxygen: No.  Oxygen Delivery: room air DISCHARGE LOCATION:  [facility] re[facility]  If you experience worsening of your  admission symptoms, develop shortness of breath, life threatening emergency, suicidal or homicidal thoughts you must seek medical attention immediately by calling 911 or calling your MD immediately  if symptoms less severe.  You Must read complete instructions/literature along with all the possible adverse reactions/side effects for all the Medicines you take and that have been prescribed to you. Take any new Medicines after you have completely understood and  accpet all the possible adverse reactions/side effects.   Please note  You were cared for by a hospitalist during your hospital stay. If you have any questions about your discharge medications or the care you received while you were in the hospital after you are discharged, you can call the unit and asked to speak with the hospitalist on call if the hospitalist that took care of you is not available. Once you are discharged, your primary care physician will handle any further medical issues. Please note that NO REFILLS for any discharge medications will be authorized once you are discharged, as it is imperative that you return to your primary care physician (or establish a relationship with a primary care physician if you do not have one) for your aftercare needs so that they can reassess your need for medications and monitor your lab values.    On the day of Discharge:  VITAL SIGNS:  Blood pressure (!) 168/93, pulse (!) 102, temperature 98.6 F (37 C), temperature source Oral, resp. rate 20, height 5\' 9"  (1.753 m), weight 93.9 kg (207 lb), SpO2 93 %. PHYSICAL EXAMINATION:  GENERAL:  61 y.o.-year-old patient lying in the bed with no acute distress.  EYES: Pupils equal, round, reactive to light and accommodation. No scleral icterus. Extraocular muscles intact.  HEENT: Head atraumatic, normocephalic. Oropharynx and nasopharynx clear.  NECK:  Supple, no jugular venous distention. No thyroid enlargement, no tenderness.  LUNGS: Normal breath sounds bilaterally, no wheezing, rales,rhonchi or crepitation. No use of accessory muscles of respiration.  CARDIOVASCULAR: S1, S2 normal. No murmurs, rubs, or gallops.  ABDOMEN: Soft, non-tender, non-distended. Bowel sounds present. No organomegaly or mass.  EXTREMITIES: No pedal edema, cyanosis, or clubbing.  NEUROLOGIC: dense left sided hemiplegia.  He has a left facial droop.  He is blind in the right eye.  Speech seems fluent. PSYCHIATRIC: The patient is  alert and oriented x 3.  SKIN: No obvious rash, lesion, or ulcer.  DATA REVIEW:   CBC  Recent Labs Lab 08/22/16 0147  WBC 17.5*  HGB 19.0*  HCT 54.4*  PLT 161    Chemistries   Recent Labs Lab 08/22/16 0147  NA 138  K 4.1  CL 103  CO2 24  GLUCOSE 271*  BUN 19  CREATININE 0.95  CALCIUM 9.3  AST 47*  ALT 98*  ALKPHOS 65  BILITOT 1.2     Microbiology Results  No results found for this or any previous visit.  RADIOLOGY:  US Venous Img Lower Unilateral Left  Result Date: 08/24/2016 CLINICAL DATA:  Recent LEFT lower extremity deep venous thrombosis. LEFT-sided weakness. EXAM: LOWER EXTREMITY VENOUS DOPPLER ULTRASOUND TECHNIQUE: Gray-scale sonography with graded compression, as well as color Doppler and duplex ultrasound were performed to evaluate the lower extremity deep venous systems from the level of the common femoral vein and including the common femoral, femoral, profunda femoral, popliteal and calf veins including the posterior tibial, peroneal and gastrocnemius veins when visible. The superficial great saphenous vein was also interrogated. Spectral Doppler was utilized to evaluate flow at rest and with distal augmentation maneuvers in  the common femoral, femoral and popliteal veins. COMPARISON:  None. FINDINGS: Contralateral Common Femoral Vein: Respiratory phasicity is normal and symmetric with the symptomatic side. No evidence of thrombus. Normal compressibility. Common Femoral Vein: Nonocclusive thrombus.  Non compressible Saphenofemoral Junction: No evidence of thrombus. Normal compressibility and flow on color Doppler imaging. Profunda Femoral Vein: No evidence of thrombus. Normal compressibility and flow on color Doppler imaging. Femoral Vein: Nonocclusive thrombus.  Non compressible. Popliteal Vein: No evidence of thrombus. Normal compressibility, respiratory phasicity and response to augmentation. Calf Veins: No evidence of thrombus. Normal compressibility and flow on  color Doppler imaging. Superficial Great Saphenous Vein: No evidence of thrombus. Normal compressibility and flow on color Doppler imaging. IMPRESSION: Nonocclusive thrombosis within the LEFT common femoral vein and femoral vein. Decreased clot burden to ultrasound 05/04/2016. No new thrombus present. Electronically Signed   By: Suzy Bouchard M.D.   On: 08/24/2016 12:19     Management plans discussed with the patient, family and they are in agreement.  CODE STATUS: Full Code   TOTAL TIME TAKING CARE OF THIS PATIENT: 45 minutes.    Max Sane M.D on 08/25/2016 at 11:09 AM  Between 7am to 6pm - Pager - [phone]  After 6pm go to [email or web address] - Proofreader  [facility]  Office  [phone]  CC: Primary care physician; [facility]   Note: This dictation was prepared with Dragon dictation along with smaller phrase technology. Any transcriptional errors that result from this process are unintentional.

## 2016-08-25 NOTE — Care Management (Signed)
Received telephone call from [place] representative for Inpatient Acute Rehab at [facility]. Chad Combs will be going to [address] [place] [address]. Discharge today per Dr. Danise Edge RN MSN CCM Care Management

## 2016-08-25 NOTE — H&P (Signed)
Physical Medicine and Rehabilitation Admission H&P    Chief Complaint  Patient presents with  . Left sided weakness, right gaze preference, slurred speech and dysphagia.     HPI:  Chad Combs is an 61 y.o. 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 after found by family on 08/21/16 with fall, left sided weakness and confusion.   History taken from chart review and son.  UDS negative. CT head done with evolving acute infarct in right parietal and temporal lobes and indeterminate minimally displaced fracture thorough tip of nasal bone. MRI reviewed showing right MCA infarct. MRA brain done revealing occlusion of R-ICA and R-MCA with acute to early subacute large R-MCA infarct. 2 D echo with EF 60% and hypokinesis of anteroseptal myocardium suggestive of CAD.  Carotid dopplers with occlusion of R-ICA with no detectable flow in the neck beyond its origin and significant plaque at level of left carotid bulb and proximal IC causing significant proximal ICA stenosis > 70%. Dr. Doy Mince evaluated patient and recommended adding Plavix to ASA for stroke prevention and to follow up with vascular for L-ICA stenosis. Follow up dopplers with non-occlusive DVT left CFV and femoral vein.    Review of Systems  HENT: Negative for hearing loss and tinnitus.   Eyes: Positive for blurred vision (lack of vision right eye). Negative for double vision.  Respiratory: Negative for cough and shortness of breath.   Cardiovascular: Negative for chest pain, palpitations and leg swelling.  Gastrointestinal: Negative for constipation, heartburn and nausea.  Genitourinary: Negative for dysuria and urgency.  Musculoskeletal: Positive for back pain.  Skin: Negative for itching and rash.  Neurological: Positive for sensory change, speech change, focal weakness and weakness. Negative for dizziness and headaches.    Psychiatric/Behavioral: The patient is nervous/anxious and has insomnia.   All other systems reviewed and are negative.   Past Medical History:  Diagnosis Date  . Blind right eye   . COPD (chronic obstructive pulmonary disease) ([place])   . Diabetes mellitus without complication ([place])   . Hypertension     History reviewed. No pertinent surgical history.    Family History  Problem Relation Age of Onset  . Cancer Mother   . Heart disease Father     Social History:  Lives alone. independent without AD. Disabled due to vision loss. He reports that he has been smoking Cigarettes--1.5 PPD.  He has a 67.50 pack-year smoking history. He has never used smokeless tobacco. He reports that he drinks alcohol once a month. He reports that he does not use drugs.    Allergies: No Known Allergies    Medications Prior to Admission  Medication Sig Dispense Refill  . apixaban (ELIQUIS) 5 MG TABS tablet Take 2 tablets (10 mg total) by mouth 2 (two) times daily. After 7 days, decrease dose to 1 tablet (5 mg total) by mouth 2 (two) times daily. 70 tablet 1  . Fluticasone-Salmeterol (ADVAIR) 100-50 MCG/DOSE AEPB Inhale 1 puff into the lungs 2 (two) times daily.    Marland Kitchen gabapentin (NEURONTIN) 100 MG capsule Take 100 mg by mouth 2 (two) times daily as needed.    Marland Kitchen lisinopril (PRINIVIL,ZESTRIL) 20 MG tablet Take 20 mg by mouth daily.    . metFORMIN (GLUCOPHAGE) 500 MG tablet Take 1,000 mg by mouth 2 (two) times daily.       Home: Home Living Family/patient  expects to be discharged to:: Private residence Living Arrangements: Alone Type of Home: House Home Access: Level entry Media: One level Bathroom Shower/Tub: Chiropodist: Standard Bathroom Accessibility: Yes Home Equipment: Grab bars - toilet, Grab bars - tub/shower   Functional History: Prior Function Level of Independence: Independent Comments: independent in ADLs and IADLs  Functional Status:  Mobility: Bed  Mobility Overal bed mobility: Needs Assistance Bed Mobility: Sit to Supine, Supine to Sit Supine to sit: Max assist, +2 for physical assistance Sit to supine: Max assist, +2 for physical assistance General bed mobility comments: unable to initiate or move L side, cuing for hand placement on R to grab bed rail, max assist to move trunk into upright position  Transfers Overall transfer level: Needs assistance Equipment used: 2 person hand held assist Transfers: Sit to/from Stand Sit to Stand: Max assist, +2 physical assistance Ambulation/Gait General Gait Details: attempted to take step w/ R LE but required max assist +2 to prevent falling, did not attempt further ambulation due to safety concerns    ADL: ADL Overall ADL's : Needs assistance/impaired Eating/Feeding: Set up, Moderate assistance Eating/Feeding Details (indicate cue type and reason): using R non dominant hand Grooming: Wash/dry hands, Wash/dry face, Oral care, Brushing hair, Set up, Moderate assistance Grooming Details (indicate cue type and reason): assist and cues for using non dominant hand and one handed tech Upper Body Dressing : Moderate assistance, Set up Lower Body Dressing: Moderate assistance, Set up, +2 for physical assistance, +2 for safety/equipment, Sit to/from stand Lower Body Dressing Details (indicate cue type and reason): L knee buckles and needs 2 person assist for leaning forward and standing.  Unable to pull pants over hips on L side due to no active movement in LUE which is dominant hand and leans to left when sitting and leaning forward. General ADL Comments: Pt seen for one handed tech for use when answering cell phone and neuromuscular re-ed for LUE and hand but no active movement elicited after tapping, no increased edema and both arms warm to touch but no redness anywhere.  Cues to look left throughout session.  Pt c/o not sleeping more than an hour last night.  Cognition: Cognition Overall  Cognitive Status: Within Functional Limits for tasks assessed Orientation Level: Oriented X4 Cognition Arousal/Alertness: Lethargic (fatigued but responded to cues and followed commands for session) Behavior During Therapy: WFL for tasks assessed/performed Overall Cognitive Status: Within Functional Limits for tasks assessed   Blood pressure (!) 152/80, pulse 96, temperature 98.8 F (37.1 C), temperature source Oral, resp. rate 16, height 5\' 9"  (1.753 m), weight 94.1 kg (207 lb 8 oz), SpO2 91 %. Physical Exam  Nursing note and vitals reviewed. Constitutional: He is oriented to person, place, and time. He appears well-developed and well-nourished.  Adult male slumped in the bed to the left with right gaze preference.   HENT:  Head: Normocephalic and atraumatic.  Eyes: Conjunctivae and EOM are normal. Pupils are equal, round, and reactive to light.  Neck: Normal range of motion. Neck supple.  Cardiovascular: Regular rhythm.  Tachycardia present.   Respiratory: Effort normal and breath sounds normal. No stridor. No respiratory distress. He has no wheezes.  GI: Soft. Bowel sounds are normal. He exhibits no distension. There is no tenderness.  Musculoskeletal: He exhibits no edema or tenderness.  Neurological: He is alert and oriented to person, place, and time. A cranial nerve deficit is present.  Left facial weakness with mild dysarthria.  Right gaze preference  and needed cues to move eyes to left field.  Left inattention.  Motor: RUE/RLE: 5/5. Intention tremor Dense left hemiplegia with sensory deficits.   Skin: Skin is warm and dry.  Psychiatric: His mood appears anxious. His speech is delayed. He is slowed. He expresses inappropriate judgment.    Results for orders placed or performed during the hospital encounter of 08/21/16 (from the past 48 hour(s))  Troponin I     Status: None   Collection Time: 08/22/16 12:54 PM  Result Value Ref Range   Troponin I <0.03 <0.03 ng/mL    Glucose, capillary     Status: Abnormal   Collection Time: 08/22/16  1:04 PM  Result Value Ref Range   Glucose-Capillary 204 (H) 65 - 99 mg/dL  Glucose, capillary     Status: Abnormal   Collection Time: 08/22/16  4:50 PM  Result Value Ref Range   Glucose-Capillary 182 (H) 65 - 99 mg/dL  Troponin I     Status: None   Collection Time: 08/22/16  7:28 PM  Result Value Ref Range   Troponin I <0.03 <0.03 ng/mL  Glucose, capillary     Status: Abnormal   Collection Time: 08/22/16  8:54 PM  Result Value Ref Range   Glucose-Capillary 298 (H) 65 - 99 mg/dL  Troponin I     Status: Abnormal   Collection Time: 08/23/16 12:48 AM  Result Value Ref Range   Troponin I 0.04 (HH) <0.03 ng/mL    Comment: CRITICAL RESULT CALLED TO, READ BACK BY AND VERIFIED WITH PHYLLIS KING AT 4650 08/23/16.PMH  Glucose, capillary     Status: Abnormal   Collection Time: 08/23/16  7:59 AM  Result Value Ref Range   Glucose-Capillary 246 (H) 65 - 99 mg/dL  Troponin I     Status: None   Collection Time: 08/23/16  8:15 AM  Result Value Ref Range   Troponin I <0.03 <0.03 ng/mL  Glucose, capillary     Status: Abnormal   Collection Time: 08/23/16 12:10 PM  Result Value Ref Range   Glucose-Capillary 272 (H) 65 - 99 mg/dL  Troponin I     Status: None   Collection Time: 08/23/16  1:18 PM  Result Value Ref Range   Troponin I <0.03 <0.03 ng/mL  Glucose, capillary     Status: Abnormal   Collection Time: 08/23/16  4:38 PM  Result Value Ref Range   Glucose-Capillary 235 (H) 65 - 99 mg/dL  Troponin I     Status: None   Collection Time: 08/23/16  8:53 PM  Result Value Ref Range   Troponin I <0.03 <0.03 ng/mL  Glucose, capillary     Status: Abnormal   Collection Time: 08/23/16  9:04 PM  Result Value Ref Range   Glucose-Capillary 270 (H) 65 - 99 mg/dL   Comment 1 Notify RN   Troponin I     Status: None   Collection Time: 08/24/16  1:19 AM  Result Value Ref Range   Troponin I <0.03 <0.03 ng/mL  Glucose, capillary      Status: Abnormal   Collection Time: 08/24/16  7:34 AM  Result Value Ref Range   Glucose-Capillary 217 (H) 65 - 99 mg/dL  Troponin I     Status: None   Collection Time: 08/24/16  9:55 AM  Result Value Ref Range   Troponin I <0.03 <0.03 ng/mL   Dg Chest 1 View  Result Date: 08/23/2016 CLINICAL DATA:  Short of breath EXAM: CHEST 1 VIEW COMPARISON:  None. FINDINGS:  Normal heart size. Lungs clear. No pneumothorax. No pleural effusion. IMPRESSION: No active disease. Electronically Signed   By: Marybelle Killings M.D.   On: 08/23/2016 07:44   Mr Brain Wo Contrast  Result Date: 08/22/2016 CLINICAL DATA:  Fall with left-sided weakness. Right MCA infarct on CT yesterday. EXAM: MRI HEAD WITHOUT CONTRAST MRA HEAD WITHOUT CONTRAST TECHNIQUE: Multiplanar, multiecho pulse sequences of the brain and surrounding structures were obtained without intravenous contrast. Angiographic images of the head were obtained using MRA technique without contrast. COMPARISON:  Head CT 08/21/2016 FINDINGS: MRI HEAD FINDINGS Brain: There is a large right MCA territory infarct diffusely involving the basal ganglia, adjacent deep white matter tracts, and portions of the temporal, frontal, and parietal lobes. The degree of diffusion restriction and the degree of cytotoxic edema in the infarct is in variable suggesting ischemia of varying age, with a more early subacute appearance of the basal ganglia infarcts and a more acute appearance of much of the cortical/subcortical infarction. There is a small amount of associated petechial hemorrhage. Two punctate foci of acute cortical infarction are present more posteriorly near the temporo occipital junction. A chronic, small cortical and subcortical infarction is present in the high posterior right frontal lobe. There is no evidence mass, midline shift, or extra-axial fluid collection. There is effacement of the right frontal horn due to the basal ganglia edema. Vascular: Abnormal appearance of the  right ICA and right MCA, more fully evaluated below. Skull and upper cervical spine: Unremarkable bone marrow signal. Sinuses/Orbits: Unremarkable orbits. Paranasal sinuses and mastoid air cells are clear. Other: None. MRA HEAD FINDINGS The visualized distal vertebral arteries are patent with the left being dominant. The basilar artery is widely patent. Right AICA and bilateral SCA is are visualized. There is a small right posterior communicating artery. PCAs are patent without evidence of significant stenosis. Lack of flow related enhancement in the distal cervical and intracranial segments of the right ICA consistent with occlusion. There is reconstitution of the right ICA terminus via the posterior communicating artery. There is proximal right M1 MCA occlusion. The right A1 segment is patent though mildly hypoplastic in appearance diffusely. The intracranial left ICA is widely patent. The left ACA and left MCA are also patent without evidence of significant proximal stenosis. No intracranial aneurysm is identified. IMPRESSION: 1. Acute to early subacute large right MCA infarct. 2. 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 3. Small chronic right frontal infarct. These results were called by telephone at the time of interpretation on 08/22/2016 at 1:35 pm to Dr. Dustin Flock, who verbally acknowledged these results. Electronically Signed   By: Logan Bores M.D.   On: 08/22/2016 13:46   Mr Jodene Nam Head/brain ZO Cm  Result Date: 08/22/2016 CLINICAL DATA:  Fall with left-sided weakness. Right MCA infarct on CT yesterday. EXAM: MRI HEAD WITHOUT CONTRAST MRA HEAD WITHOUT CONTRAST TECHNIQUE: Multiplanar, multiecho pulse sequences of the brain and surrounding structures were obtained without intravenous contrast. Angiographic images of the head were obtained using MRA technique without contrast. COMPARISON:  Head CT 08/21/2016 FINDINGS: MRI HEAD FINDINGS Brain: There is a large right MCA territory  infarct diffusely involving the basal ganglia, adjacent deep white matter tracts, and portions of the temporal, frontal, and parietal lobes. The degree of diffusion restriction and the degree of cytotoxic edema in the infarct is in variable suggesting ischemia of varying age, with a more early subacute appearance of the basal ganglia infarcts and a more acute appearance of much  of the cortical/subcortical infarction. There is a small amount of associated petechial hemorrhage. Two punctate foci of acute cortical infarction are present more posteriorly near the temporo occipital junction. A chronic, small cortical and subcortical infarction is present in the high posterior right frontal lobe. There is no evidence mass, midline shift, or extra-axial fluid collection. There is effacement of the right frontal horn due to the basal ganglia edema. Vascular: Abnormal appearance of the right ICA and right MCA, more fully evaluated below. Skull and upper cervical spine: Unremarkable bone marrow signal. Sinuses/Orbits: Unremarkable orbits. Paranasal sinuses and mastoid air cells are clear. Other: None. MRA HEAD FINDINGS The visualized distal vertebral arteries are patent with the left being dominant. The basilar artery is widely patent. Right AICA and bilateral SCA is are visualized. There is a small right posterior communicating artery. PCAs are patent without evidence of significant stenosis. Lack of flow related enhancement in the distal cervical and intracranial segments of the right ICA consistent with occlusion. There is reconstitution of the right ICA terminus via the posterior communicating artery. There is proximal right M1 MCA occlusion. The right A1 segment is patent though mildly hypoplastic in appearance diffusely. The intracranial left ICA is widely patent. The left ACA and left MCA are also patent without evidence of significant proximal stenosis. No intracranial aneurysm is identified. IMPRESSION: 1. Acute to  early subacute large right MCA infarct. 2. 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 3. Small chronic right frontal infarct. These results were called by telephone at the time of interpretation on 08/22/2016 at 1:35 pm to Dr. Dustin Flock, who verbally acknowledged these results. Electronically Signed   By: Logan Bores M.D.   On: 08/22/2016 13:46       Medical Problem List and Plan: 1.  Left hemiplegia, limitations with self-care, difficulty with transfers secondary to right MCA CVA. 2.  DVT Prophylaxis/Anticoagulation: Pharmaceutical: Lovenox 3. Pain Management: N/A 4. Mood: Team to provide ego support. Expressing anxiety about current situation. Has supportive family. LCSW to follow for evaluation and support.  5. Neuropsych: This patient is capable of making decisions on his own behalf. 6. Skin/Wound Care: routine pressure relief measures.  7. Fluids/Electrolytes/Nutrition: Monitor I/O. Check lytes in am. Encourage fluid intake.  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 9. T2DM: Hgb A1C- 10.5 --question compliance. Continue metformin bid. Was started on lantus with SSI for elevated BS--will consult RD for diabetic education and start education on insulin administration.  Will monitor BS ac/hs 10. HTN: Monitor BP bid--on lisinopril daily.  11. Leucocytosis: Monitor for signs of infection. UA negative.  12. Polycythemia: Smokes 1.5 PPD. Recheck in am.  13. COPD: SOB reported--CXR negative. Encourage tobacco cessation.  Managed without medications at this time.   14. Tobacco abuse: Counsel   Post Admission Physician Evaluation: 1. Preadmission assessment reviewed and changes made below. 2. Functional deficits secondary  to right MCA CVA. 3. Patient is admitted to receive collaborative, interdisciplinary care between the physiatrist, rehab nursing staff, and therapy team. 4. Patient's level of medical complexity and substantial therapy needs in context of  that medical necessity cannot be provided at a lesser intensity of care such as a SNF. 5. Patient has experienced substantial functional loss from his/her baseline which was documented above under the "Functional History" and "Functional Status" headings.  Judging by the patient's diagnosis, physical exam, and functional history, the patient has potential for functional progress which will result in measurable gains while on inpatient  rehab.  These gains will be of substantial and practical use upon discharge  in facilitating mobility and self-care at the household level. 6. Physiatrist will provide 24 hour management of medical needs as well as oversight of the therapy plan/treatment and provide guidance as appropriate regarding the interaction of the two. 7. The Preadmission Screening has been reviewed and patient status is unchanged unless otherwise stated above. 8. 24 hour rehab nursing will assist with bladder management, safety, disease management and patient education  and help integrate therapy concepts, techniques,education, etc. 9. PT will assess and treat for/with: Lower extremity strength, range of motion, stamina, balance, functional mobility, safety, adaptive techniques and equipment,  coping skills, pain control, stroke education.   Goals are: Min A. 10. OT will assess and treat for/with: ADL's, functional mobility, safety, upper extremity strength, adaptive techniques and equipment,  ego support, and community reintegration.   Goals are: Min A. Therapy may proceed with showering this patient. 11. SLP will assess and treat for/with: speech, swallowing, cognition.  Goals are: Mod I. 12. Case Management and Social Worker will assess and treat for psychological issues and discharge planning. 71. Team conference will be held weekly to assess progress toward goals and to determine barriers to discharge. 14. Patient will receive at least 3 hours of therapy per day at least 5 days per  week. 15. ELOS: 18-21 days.        16. Prognosis:  good  Delice Lesch, MD, [address] [place] 08/24/2016

## 2016-08-25 NOTE — Progress Notes (Signed)
Chad Diones, RN Rehab Admission Coordinator Addendum Physical Medicine and Rehabilitation  PMR Pre-admission Date of Service: 08/24/2016 10:56 AM  Related encounter: ED to Hosp-Admission (Discharged) from 08/21/2016 in [place] (1C)       [] Hide copied text   [employer] PMR Admission Coordinator Pre-Admission Assessment  Patient: Chad Combs is an 61 y.o., male MRN: [id number] DOB: 1955/09/18 Height: 5\' 9"  (175.3 cm) Weight: 94.1 kg (207 lb 8 oz) (w/o SCD ON BED)  Insurance Information Self pay - no insurance  Medicaid Application Date:        Case Manager:   Disability Application Date:        Case Worker:    Emergency Tax adviser Information    Name Relation Home Work Mobile   Combs,Chad Son [phone]        Current Medical History  Patient Admitting Diagnosis:  R MCA CVA  History of Present Illness:A 61 y.o. 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 Patient's family reportedly checked on the patient after they did not see him on social media for about 10 hours. Patient states his symptoms started around 3 PM 08/21/16, about 5-6 hours prior to arrival in the emergency department although he was by himself. He reports sudden onset of left-sided weakness associated with the fall. His symptoms have persisted. Per report, pt had stopped taking his Eloquis(ran out).  Noted MRI results of acute infarct at the right parietal and temporal lobes, and right basal ganglia.  PT/OT/SLP evaluations completed with recommendations for acute inpatient rehab admission.  Patient started on dysphagia 3, thin liquids.  Patient underwent an IVC filter placement on 08/24/16 due to his history of DVT with PE.  Patient's medical record from  [facility] has  been reviewed by the rehabilitation admission coordinator and physician.  NIH Stroke scale: 17  Past Medical History      Past Medical History:  Diagnosis Date  . Blind right eye   . COPD (chronic obstructive pulmonary disease) ([place])   . Diabetes mellitus without complication ([place] )   . Hypertension     Family History   family history includes Cancer in his mother; Heart disease in his father.  Prior Rehab/Hospitalizations Has the patient had major surgery during 100 days prior to admission? No              Current Medications  See MAR from [facility]  Patients Current Diet:   Dys 3, thin liquids  Precautions / Restrictions Precautions Precautions: Fall Precaution Comments: L sided neglect, shoulder increased risk for subluxation--pt fell out of bed last night with knee abrasion only per NSG report Restrictions Weight Bearing Restrictions: No   Has the patient had 2 or more falls or a fall with injury in the past year?Yes.  First fall was at the time of the CVA and the second fall was on 08/23/16  Prior Activity Level Limited Community (1-2x/wk): Went out 2-3 times a week, was driving.  Prior Functional Level Self Care: Did the patient need help bathing, dressing, using the toilet or eating?  Independent  Indoor Mobility: Did the patient need assistance with walking from room to room (with or without device)? Independent  Stairs: Did the patient need assistance with internal or  external stairs (with or without device)? Independent  Functional Cognition: Did the patient need help planning regular tasks such as shopping or remembering to take medications? Independent  Home Assistive Devices / Equipment Home Assistive Devices/Equipment: Eyeglasses Home Equipment: Grab bars - toilet, Grab bars - tub/shower  Prior Device Use: Indicate devices/aids used by the patient prior to current illness, exacerbation or injury? None   Prior  Functional Level Current Functional Level  Bed Mobility  Independent  Max assist   Transfers  Independent  Max assist   Mobility - Walk/Wheelchair  Independent  Max assist (Needed +2 max assist to take a step.)   Upper Body Dressing  Independent  Mod assist   Lower Body Dressing  Independent  Mod assist   Grooming  Independent  Mod assist   Eating/Drinking  Independent  Mod assist   Toilet Transfer  Independent  Total assist   Bladder Continence   WDL  Voiding in urinal   Bowel Management  WDL  No documented BM since admission.  Patient reports BM on 08/19/16   Stair Climbing  Independent Other (Not tested.)   Communication  Verbally intact  Verbal   Memory  Mild impairment  Mild impairment   Cooking/Meal Prep  Independent      Housework  independent    Money Management  Independent    Driving  Yes, independent     Special needs/care consideration BiPAP/CPAP No CPM No Continuous Drip IV No Dialysis No       Life Vest No Oxygen No Special Bed No Trach Size No Wound Vac (area) No     Skin Knee abrasion from fall 08/23/16                            Bowel mgmt: No BM since 08/19/16 per patient Bladder mgmt: Voiding in urinal Diabetic mgmt Yes, on oral medication and insulin at home  Previous Home Environment Living Arrangements: Alone  Lives With: Alone Available Help at Discharge: Family Type of Home: House Home Layout: One level Home Access: Level entry Bathroom Shower/Tub: Chiropodist: Standard Bathroom Accessibility: Yes [place]: No  Discharge Living Setting Plans for Discharge Living Setting: [place] (Lived alone.  Chad have children stay after discharge.) Type of Home at Discharge: Apartment Discharge Home Layout: One level Discharge Home Access: Level entry Does the patient have any problems obtaining your medications?:  No  Social/Family/Support Systems Patient Roles: Parent (Has 2 sons.) Contact Information: Nathian Combs - son Anticipated Caregiver: Chad Combs - son Anticipated Caregiver's Contact Information: Chad - son - [phone] Ability/Limitations of Caregiver: Son plans for him, his brother and sister to share cargiver responsibility Caregiver Availability: 24/7 (Son aware of need for 24/7 care/supervision initially.) Discharge Plan Discussed with Primary Caregiver: Yes Is Caregiver In Agreement with Plan?: Yes Does Caregiver/Family have Issues with Lodging/Transportation while Pt is in Rehab?: No  Goals/Additional Needs Patient/Family Goal for Rehab: PT/OT supervision to min assist, SLP supervision goals Expected length of stay: 14-18 days Cultural Considerations: None Dietary Needs: Dys 3, thin liquids Equipment Needs: TBD Pt/Family Agrees to Admission and willing to participate: Yes Program Orientation Provided & Reviewed with Pt/Caregiver Including Roles  & Responsibilities: Yes  Patient Condition: Patient has suffered a right MCA stroke.  He is currently requiring maximum assist for mobility and mod to max assist for ADLs.  He lived alone, but children are planning to stay and assist him after  rehab discharge.  Patient Chad benefit from 3 hours of therapy a day and can tolerate the therapy.  He Chad benefit from the coordinated approach of the inpatient rehab team care.  He Chad need PT/OT/SLP therapies on going.  I have discussed all information and progress with rehab MD and have approval for acute inpatient rehab admission for today.  Patient and son are in agreement to CIR admission.  Preadmission Screen Completed By:  Chad Combs, 08/24/2016 11:17 AM ______________________________________________________________________   Discussed status with Dr. Naaman Plummer on 08/25/16 at 55 and received telephone approval for admission today.  Admission Coordinator:  Chad Combs, time  1106/Date 08/25/16   Assessment/Plan: Diagnosis: Right MCA infarct 1. Does the need for close, 24 hr/day  Medical supervision in concert with the patient's rehab needs make it unreasonable for this patient to be served in a less intensive setting? Yes 2. Co-Morbidities requiring supervision/potential complications: COPD, DM, labile HTN, post-stroke sequelae 3. Due to bladder management, bowel management, safety, skin/wound care, disease management, medication administration, pain management and patient education, does the patient require 24 hr/day rehab nursing? Yes 4. Does the patient require coordinated care of a physician, rehab nurse, PT (1-2 hrs/day, 5 days/week), OT (1-2 hrs/day, 5 days/week) and SLP (1-2 hrs/day, 5 days/week) to address physical and functional deficits in the context of the above medical diagnosis(es)? Yes Addressing deficits in the following areas: balance, endurance, locomotion, strength, transferring, bowel/bladder control, bathing, dressing, feeding, grooming, toileting, cognition, speech, swallowing and psychosocial support 5. Can the patient actively participate in an intensive therapy program of at least 3 hrs of therapy 5 days a week? Yes 6. The potential for patient to make measurable gains while on inpatient rehab is excellent 7. Anticipated functional outcomes upon discharge from inpatients are: supervision and min assist PT, supervision and min assist OT, supervision SLP 8. Estimated rehab length of stay to reach the above functional goals is: 14-18 days 9. Does the patient have adequate social supports to accommodate these discharge functional goals? Yes 10. Anticipated D/C setting: Home 11. Anticipated post D/C treatments: HH therapy and Outpatient therapy 12. Overall Rehab/Functional Prognosis: excellent    RECOMMENDATIONS: This patient's condition is appropriate for continued rehabilitative care in the following setting: CIR Patient has agreed to  participate in recommended program. Yes Note that insurance prior authorization may be required for reimbursement for recommended care.  Comment:Admit to inpatient rehab today  Meredith Staggers, MD, [place] 08/24/2016   Chad Combs 08/24/2016    Cosigned

## 2016-08-26 ENCOUNTER — Inpatient Hospital Stay (HOSPITAL_COMMUNITY): Payer: Self-pay | Admitting: Speech Pathology

## 2016-08-26 ENCOUNTER — Inpatient Hospital Stay (HOSPITAL_COMMUNITY): Payer: Self-pay

## 2016-08-26 ENCOUNTER — Inpatient Hospital Stay (HOSPITAL_COMMUNITY): Payer: Self-pay | Admitting: Occupational Therapy

## 2016-08-26 LAB — GLUCOSE, CAPILLARY
GLUCOSE-CAPILLARY: 215 mg/dL — AB (ref 65–99)
GLUCOSE-CAPILLARY: 237 mg/dL — AB (ref 65–99)
Glucose-Capillary: 248 mg/dL — ABNORMAL HIGH (ref 65–99)
Glucose-Capillary: 188 mg/dL — ABNORMAL HIGH (ref 65–99)

## 2016-08-26 LAB — CBC WITH DIFFERENTIAL/PLATELET
BASOS ABS: 0 10*3/uL (ref 0.0–0.1)
BASOS PCT: 0 %
EOS ABS: 0.1 10*3/uL (ref 0.0–0.7)
EOS PCT: 1 %
HCT: 53.4 % — ABNORMAL HIGH (ref 39.0–52.0)
Hemoglobin: 18.1 g/dL — ABNORMAL HIGH (ref 13.0–17.0)
Lymphocytes Relative: 27 %
Lymphs Abs: 3.2 10*3/uL (ref 0.7–4.0)
MCH: 33.6 pg (ref 26.0–34.0)
MCHC: 33.9 g/dL (ref 30.0–36.0)
MCV: 99.1 fL (ref 78.0–100.0)
MONO ABS: 1 10*3/uL (ref 0.1–1.0)
Monocytes Relative: 8 %
Neutro Abs: 7.6 10*3/uL (ref 1.7–7.7)
Neutrophils Relative %: 64 %
PLATELETS: 166 10*3/uL (ref 150–400)
RBC: 5.39 MIL/uL (ref 4.22–5.81)
RDW: 12.8 % (ref 11.5–15.5)
WBC: 12 10*3/uL — ABNORMAL HIGH (ref 4.0–10.5)

## 2016-08-26 LAB — COMPREHENSIVE METABOLIC PANEL
ALBUMIN: 3.3 g/dL — AB (ref 3.5–5.0)
ALT: 79 U/L — ABNORMAL HIGH (ref 17–63)
AST: 44 U/L — AB (ref 15–41)
Alkaline Phosphatase: 61 U/L (ref 38–126)
Anion gap: 10 (ref 5–15)
BUN: 19 mg/dL (ref 6–20)
CHLORIDE: 103 mmol/L (ref 101–111)
CO2: 23 mmol/L (ref 22–32)
Calcium: 9.2 mg/dL (ref 8.9–10.3)
Creatinine, Ser: 1.2 mg/dL (ref 0.61–1.24)
GFR calc Af Amer: >60 mL/min (ref >60)
Glucose, Bld: 212 mg/dL — ABNORMAL HIGH (ref 65–99)
POTASSIUM: 3.8 mmol/L (ref 3.5–5.1)
SODIUM: 136 mmol/L (ref 135–145)
Total Bilirubin: 1.4 mg/dL — ABNORMAL HIGH (ref 0.3–1.2)
Total Protein: 7 g/dL (ref 6.5–8.1)

## 2016-08-26 NOTE — Evaluation (Signed)
Speech Language Pathology Assessment and Plan  Patient Details  Name: Chad Combs MRN: [id number] Date of Birth: [birth year]  SLP Diagnosis: Dysarthria;Cognitive Impairments;Dysphagia  Rehab Potential: Good ELOS: 3 to 4 weeks    Today's Date: 08/26/2016 SLP Individual Time: 0900-1000 SLP Individual Time Calculation (min): 60 min   Problem List:  Patient Active Problem List   Diagnosis Date Noted  . Acute right MCA stroke ([place]) 08/25/2016  . Left hemiplegia ([place])   . Dysarthria, post-stroke   . Dysphagia, post-stroke   . Occlusion and stenosis of carotid artery   . Diabetes mellitus ([place])   . Benign essential HTN   . Leukocytosis   . Polycythemia vera ([place])   . Tobacco abuse   . Chronic obstructive pulmonary disease ([place])   . CVA (cerebral vascular accident) ([place]) 08/21/2016  . Hypertension 07/04/2015  . Diabetes mellitus without complication ([place]) 81/07/5484  . 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 COPD (chronic obstructive pulmonary disease) ([place]) 07/04/2015   Past Medical History:  Past Medical History:  Diagnosis Date  . Blind right eye   . COPD (chronic obstructive pulmonary disease) ([place])   . Diabetes mellitus without complication ([place])   . Hypertension    Past Surgical History:  Past Surgical History:  Procedure Laterality Date  . IVC FILTER INSERTION N/A 08/24/2016   Procedure: IVC Filter Insertion;  Surgeon: Katha Cabal, MD;  Location: [facility];  Service: Cardiovascular;  Laterality: N/A;    Assessment / Plan / Recommendation Clinical Impression Chad Combs an 61 y.o.Left handed malewith history of COPD, DM, HTN, CVA with loss of vision right eye, LLE DVT 11/17 (ran out of Eliquis) who was admitted to [facility] after found by family on 08/21/16 with fall, left sided weakness and confusion. History taken from chart review and son. UDS negative. CT head done with evolving acute infarct in right parietal and temporal lobes and indeterminate minimally  displaced fracture thorough tip of nasal bone. MRI reviewed showing right MCA infarct. MRA brain done revealing occlusion of R-ICA and R-MCA with acute to early subacute large R-MCA infarct. 2 D echo with EF 60% and hypokinesis of anteroseptal myocardium suggestive of CAD. Carotid dopplers with occlusion of R-ICA with no detectable flow in the neck beyond its origin and significant plaque at level of left carotid bulb and proximal IC causing significant proximal ICA stenosis >70%. Dr. Doy Mince evaluated patient and recommended adding Plavix to ASA for stroke prevention and to follow up with vascular for L-ICA stenosis. Follow up dopplers with non-occlusive DVT left CFV and femoral vein. Pt admited to CIR on 08/25/16.   Bedside swallow and cognitive linguistic evaluations completed on 61/8/18. Pt presents with mild oral phase dysphagia c/b pocketing on left buccal/sulci cavity, impulsivity, large bolus size, mild oral residue after swallow and anterior spillage. Pt also presents with moderate cognitive deficits as he recevied a score of 14 out of 22 points on MOCA Blind (n=>18). Pt with deficits in the areas of sustained attention, basic to complex problem solving, medication and money management, emergent awareness, and recall of new information. Pt speech intelligibility of ~50% at the sentence level d/t flaccid dysarthria. Pt requires skilled ST to address the above mentioned deficits to increase funcitonal independence and reduce caregiver burden prior to discharge. Anticipate that pt will need intermitten supervision at discharge with the possibility of follow up ST services.     Skilled Therapeutic Interventions  Skilled treatment session focused on completion of cognitive linguistic and bedside swallow evaluations, see above. Pt required Max A cues for use of compensatory swallow strategies with dysphagia 3 diet with thin liquids, Max A verbal cues for ~ 50% intelligibility at the sentence  level, Max A verbal cues basic problem solving. Pt left in bed, bed alarm on and all needs in place.    SLP Assessment  Patient will need skilled Speech Lanaguage Pathology Services during CIR admission    Recommendations  SLP Diet Recommendations: Dysphagia 3 (Mech soft);Thin Liquid Administration via: Cup;Straw Medication Administration: Whole meds with puree Supervision: Full supervision/cueing for compensatory strategies Compensations: Minimize environmental distractions;Slow rate;Small sips/bites;Lingual sweep for clearance of pocketing Postural Changes and/or Swallow Maneuvers: Seated upright 90 degrees Oral Care Recommendations: Oral care BID Patient destination: Home Follow up Recommendations: Home Health SLP Equipment Recommended: None recommended by SLP    SLP Frequency 3 to 5 out of 7 days   SLP Duration  SLP Intensity  SLP Treatment/Interventions 3 to 4 weeks  Minumum of 1-2 x/day, 30 to 90 minutes  Cognitive remediation/compensation;Cueing hierarchy;Dysphagia/aspiration precaution training;Functional tasks;Medication managment;Patient/family education;Therapeutic Activities;Environmental controls;Internal/external aids;Oral motor exercises;Therapeutic Exercise    Pain Pain Assessment Pain Assessment: No/denies pain Pain Score: Asleep  Prior Functioning Cognitive/Linguistic Baseline: Within functional limits Type of Home: House  Lives With: Alone Available Help at Discharge: Family Vocation: On disability  Function:  Eating Eating   Modified Consistency Diet: Yes Eating Assist Level: Swallowing techniques: self managed;Set up assist for;Supervision or verbal cues   Eating Set Up Assist For: Opening containers       Cognition Comprehension Comprehension assist level: Understands basic 75 - 89% of the time/ requires cueing 10 - 24% of the time  Expression   Expression assist level: Expresses basic 75 - 89% of the time/requires cueing 10 - 24% of the  time. Needs helper to occlude trach/needs to repeat words.  Social Interaction Social Interaction assist level: Interacts appropriately 75 - 89% of the time - Needs redirection for appropriate language or to initiate interaction.  Problem Solving Problem solving assist level: Solves basic 50 - 74% of the time/requires cueing 25 - 49% of the time  Memory Memory assist level: Recognizes or recalls 50 - 74% of the time/requires cueing 25 - 49% of the time   Short Term Goals: Week 1: SLP Short Term Goal 1 (Week 1): Given Mod A verbal cues, pt will utilize external memory aids to recall new daily information.  SLP Short Term Goal 2 (Week 1): Pt will utilize compensatory intelligibility strategies with Mod A verbal cues to achieve 75% intelligibility at the sentence level.  SLP Short Term Goal 3 (Week 1): Pt will demonstrate sustained attention in mildly distracting environment for ~30 minutes with Mod A verbal cues.  SLP Short Term Goal 4 (Week 1): Pt will complete basic, familiar problem solving tasks with Mod A verbal cues.  SLP Short Term Goal 5 (Week 1): Pt will scan to left of his environment with Mod A verbal cues.  SLP Short Term Goal 6 (Week 1): Pt will consume current diet without overt s/s of aspiration with Mod A verbal cues.   Refer to Care Plan for Long Term Goals  Recommendations for other services: None   Discharge Criteria: Patient will be discharged from SLP if patient refuses treatment 3 consecutive times without medical reason, if treatment goals not met, if there is a change in medical status, if patient makes no progress towards goals  or if patient is discharged from hospital.  The above assessment, treatment plan, treatment alternatives and goals were discussed and mutually agreed upon: by patient   Carmin Dibartolo B. [name], M.S., CCC-SLP Speech-Language Pathologist   Stephane Niemann 08/26/2016, 5:06 PM

## 2016-08-26 NOTE — Evaluation (Signed)
Occupational Therapy Assessment and Plan  Patient Details  Name: Chad Combs MRN: [id number] Date of Birth: 25-Nov-1955  OT Diagnosis: abnormal posture, cognitive deficits, disturbance of vision, flaccid hemiplegia and hemiparesis and muscle weakness (generalized) Rehab Potential: Rehab Potential (ACUTE ONLY): Good ELOS: 23-25 days   Today's Date: 08/26/2016 OT Individual Time: 0350-0938 OT Individual Time Calculation (min): 59 min     Problem List:  Patient Active Problem List   Diagnosis Date Noted  . Acute right MCA stroke ([place]) 08/25/2016  . Left hemiplegia ([place])   . Dysarthria, post-stroke   . Dysphagia, post-stroke   . Occlusion and stenosis of carotid artery   . Diabetes mellitus ([place])   . Benign essential HTN   . Leukocytosis   . Polycythemia vera ([place])   . Tobacco abuse   . Chronic obstructive pulmonary disease ([place])   . CVA (cerebral vascular accident) ([place]) 08/21/2016  . Hypertension 07/04/2015  . Diabetes mellitus without complication ([place]) 18/29/9371  . 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 COPD (chronic obstructive pulmonary disease) ([place]) 07/04/2015    Past Medical History:  Past Medical History:  Diagnosis Date  . Blind right eye   . COPD (chronic obstructive pulmonary disease) ([place])   . Diabetes mellitus without complication ([place])   . Hypertension    Past Surgical History:  Past Surgical History:  Procedure Laterality Date  . IVC FILTER INSERTION N/A 08/24/2016   Procedure: IVC Filter Insertion;  Surgeon: Katha Cabal, MD;  Location: [facility];  Service: Cardiovascular;  Laterality: N/A;    Assessment & Plan Clinical Impression: Patient is a 61 y.o. year old male with recent admission to the hospital on 08/21/16 with fall, left sided weakness and confusion.   History taken from chart review and son.  UDS negative. CT head done with evolving acute infarct in right parietal and temporal lobes and indeterminate minimally displaced fracture thorough  tip of nasal bone. MRI reviewed showing right MCA infarct. MRA brain done revealing occlusion of R-ICA and R-MCA with acute to early subacute large R-MCA infarct.  Patient transferred to CIR on 08/25/2016 .    Patient currently requires total with basic self-care skills secondary to muscle weakness, impaired timing and sequencing, unbalanced muscle activation and decreased coordination, field cut, decreased midline orientation and decreased attention to left, decreased attention, decreased awareness, decreased problem solving, decreased safety awareness and delayed processing and decreased sitting balance, decreased standing balance, decreased postural control, hemiplegia and decreased balance strategies.  Prior to hospitalization, patient could complete ADLs with independent .  Patient will benefit from skilled intervention to decrease level of assist with basic self-care skills and increase independence with basic self-care skills prior to discharge home with his sons assisting 24 hr.  Anticipate patient will require minimal physical assistance and follow up home health.  OT - End of Session Activity Tolerance: Decreased this session;Tolerates 30+ min activity with multiple rests Endurance Deficit: Yes Endurance Deficit Description: pt somnolent, drifting off during session OT Assessment Rehab Potential (ACUTE ONLY): Good Barriers to Discharge: Decreased caregiver support Barriers to Discharge Comments: Pt was living alone OT Patient demonstrates impairments in the following area(s): Balance;Cognition;Motor;Endurance;Perception;Vision;Sensory;Safety OT Basic ADL's Functional Problem(s): Eating;Grooming;Bathing;Dressing;Toileting OT Transfers Functional Problem(s): Toilet;Tub/Shower OT Additional Impairment(s): Fuctional Use of Upper Extremity OT Plan OT Intensity: Minimum of 1-2 x/day, 45 to 90 minutes OT Frequency: 5 out of 7 days OT Duration/Estimated Length of Stay: 23-25 days OT  Treatment/Interventions: Balance/vestibular training;Community reintegration;Cognitive remediation/compensation;Disease mangement/prevention;Discharge planning;DME/adaptive equipment instruction;Neuromuscular re-education;Functional  mobility training;Functional electrical stimulation;Pain management;Patient/family education;Psychosocial support;Self Care/advanced ADL retraining;Splinting/orthotics;Therapeutic Activities;Therapeutic Exercise;UE/LE Strength taining/ROM;UE/LE Coordination activities;Visual/perceptual remediation/compensation;Wheelchair propulsion/positioning OT Self Feeding Anticipated Outcome(s): modified independent OT Basic Self-Care Anticipated Outcome(s): min assist OT Toileting Anticipated Outcome(s): min assist OT Bathroom Transfers Anticipated Outcome(s): min assist OT Recommendation Patient destination: Home Follow Up Recommendations: Home health OT Equipment Recommended: 3 in 1 bedside comode;Tub/shower bench   Skilled Therapeutic Intervention Pt began working on selfcare retraining supine to sit EOB during session.  Max assist for rolling to the right in bed in order to clean peri area and donn new brief, after removing bed pan.  Max assist for initial sidelying to sit on the left side.  He needed mod assist for static sitting balance with regression to max assist for dynamic balance while engaged in bathing tasks.  Increased pushing to the left noted in sitting with posterior lean as well. Max hand over hand assist for integrating the LUE into bathing task to wash the right hand and arm.  Total assist for sit to stand and for stand pivot to the wheelchair during session.  Pt returned to bed for safety reasons until better wheelchair can be obtained by PT.  Pt with increased fatigue at end of session, noted eyes closed but easily responded to questions.  Provided education on LUE positioning as well.  Finished session with discussion of pt needing 24 hour physical assist at  discharge and 3-4 weeks inpatient rehab stay.  Call button in reach and bed alarm in place.   OT Evaluation Precautions/Restrictions  Precautions Precautions: Fall Precaution Comments: L sided neglect, shoulder increased risk for subluxation; quick LOB L wihtout awareness Restrictions Weight Bearing Restrictions: No  Pain Pain Assessment Pain Assessment: No/denies pain Pain Score: Asleep Home Living/Prior Functioning Home Living Living Arrangements: Alone Available Help at Discharge: Family Type of Home: House Home Access: Level entry Home Layout: One level Bathroom Shower/Tub: Chiropodist: Standard Bathroom Accessibility:  (TBD)  Lives With: Alone IADL History Homemaking Responsibilities: Yes Meal Prep Responsibility: Primary Laundry Responsibility: Primary Cleaning Responsibility: Primary Bill Paying/Finance Responsibility: Primary Shopping Responsibility: Primary Current License: Yes Mode of Transportation: Car Occupation: Retired Prior Function Level of Independence: Independent with basic ADLs, Independent with gait  Able to Take Stairs?:  (NA) Driving: Yes Vocation: On disability Leisure: Hobbies-no Comments: independent in ADLs and IADLs ADL  See Function Section of chart for details  Vision/Perception  Vision- History Baseline Vision/History:  (Pt reports history of stroke in the right eye causing blindness) Patient Visual Report: No change from baseline Vision- Assessment Vision Assessment?: Yes Eye Alignment: Impaired (comment) (right eye deviates right) Ocular Range of Motion: Within Functional Limits (for left eye) Alignment/Gaze Preference: Head turned (head turn to the right) Tracking/Visual Pursuits: Decreased smoothness of horizontal tracking Convergence: Within functional limits (WFL left) Visual Fields: Impaired-to be further tested in functional context  Cognition Overall Cognitive Status: Impaired/Different from  baseline Arousal/Alertness: Awake/alert Orientation Level: Person;Place;Situation Person: Oriented Place: Oriented Situation: Oriented Year: 2018 Month: March Day of Week: Correct Memory: Impaired Memory Impairment: Decreased recall of new information Immediate Memory Recall: Sock;Blue;Bed Memory Recall: Sock;Blue;Bed Memory Recall Sock: With Cue Memory Recall Blue: With Cue Memory Recall Bed: With Cue Attention: Sustained Sustained Attention: Impaired Sustained Attention Impairment: Verbal complex;Functional complex Awareness: Impaired Awareness Impairment: Emergent impairment Problem Solving: Impaired Problem Solving Impairment: Verbal complex;Functional complex Behaviors: Impulsive Safety/Judgment: Impaired Sensation Sensation Light Touch: Impaired Detail Light Touch Impaired Details: Impaired LUE Stereognosis: Not tested Hot/Cold: Not tested Proprioception:  Not tested Additional Comments: Pt able to detect light touch in the upper arm but was not able to consistently determine below the elbow.  Could not feet deep pressure in the finger either.  Coordination Gross Motor Movements are Fluid and Coordinated: No Fine Motor Movements are Fluid and Coordinated: No Coordination and Movement Description: Pt currently Brunnstrum stage II in the left arm and stage I in the hand.  He needed max hand over hand assistance for integration as an active assist with washing the RUE during ADL session.  Heel Shin Test: unable LLE Motor  Motor Motor: Hemiplegia;Abnormal postural alignment and control Motor - Skilled Clinical Observations: hypotonic LLE Mobility  Bed Mobility Bed Mobility: Rolling Right;Rolling Left;Left Sidelying to Sit Rolling Right: 3: Mod assist Rolling Right Details: Manual facilitation for weight shifting;Manual facilitation for placement;Verbal cues for technique Rolling Left: 5: Supervision;With rail Rolling Left Details: Visual cues/gestures for  sequencing Left Sidelying to Sit: 2: Max assist Left Sidelying to Sit Details: Manual facilitation for placement;Manual facilitation for weight shifting;Verbal cues for precautions/safety;Verbal cues for sequencing Transfers Transfers: Sit to Stand;Stand to Sit Sit to Stand: 1: +1 Total assist;With upper extremity assist;From bed Sit to Stand Details: Manual facilitation for weight shifting;Tactile cues for weight beaing;Verbal cues for technique Stand to Sit: 1: +1 Total assist;With upper extremity assist;To bed Stand to Sit Details (indicate cue type and reason): Manual facilitation for weight shifting;Tactile cues for weight beaing;Verbal cues for technique  Trunk/Postural Assessment  Cervical Assessment Cervical Assessment: Exceptions to [facility] (Slight cervical rotation to the right) Thoracic Assessment Thoracic Assessment: Exceptions to [facility] (Rounded shoulders with increased passive trunk elongation on the left and active shortening on the right. ) Lumbar Assessment Lumbar Assessment: Exceptions to [facility] (increased posterior pelvic tilt in sitting) Postural Control Postural Control: Deficits on evaluation (Frequent LOB to the left with max assist to self correct) Righting Reactions: LOB L with poor awareness and no trunk righting noted  Balance Balance Balance Assessed: Yes Static Sitting Balance Static Sitting - Balance Support: Feet supported;Right upper extremity supported Static Sitting - Level of Assistance: 3: Mod assist Dynamic Sitting Balance Dynamic Sitting - Balance Support: No upper extremity supported;During functional activity Dynamic Sitting - Level of Assistance: 2: Max assist Sitting balance - Comments: Frequent LOB to the left. Static Standing Balance Static Standing - Balance Support: During functional activity Static Standing - Level of Assistance: 1: +1 Total assist Dynamic Standing Balance Dynamic Standing - Balance Support: During functional activity Dynamic  Standing - Level of Assistance: 1: +2 Total assist;Patient percentage (comment);Other (comment) (Pt 20%) Extremity/Trunk Assessment RUE Assessment RUE Assessment: Within Functional Limits LUE Assessment LUE Assessment: Exceptions to [facility] LUE Strength LUE Overall Strength Comments: Pt with Brunnstrum stage I movement in the left hand and stage II in the arm, with trace shoulder movements noted.  One finger inferior subluxation noted in the shoulder.  PROM WFLS for digits, and elbow.  PROM shoulder flexion to 130 degrees with pt reporting increased anterior shoulder pain.     See Function Navigator for Current Functional Status.   Refer to Care Plan for Long Term Goals  Recommendations for other services: None    Discharge Criteria: Patient will be discharged from OT if patient refuses treatment 3 consecutive times without medical reason, if treatment goals not met, if there is a change in medical status, if patient makes no progress towards goals or if patient is discharged from hospital.  The above assessment, treatment plan, treatment alternatives and goals  were discussed and mutually agreed upon: by patient  MCGUIRE,JAMES OTR/L 08/26/2016, 5:04 PM

## 2016-08-26 NOTE — Care Management Note (Signed)
[facility] Individual Statement of Services  Patient Name:  Chad Combs  Date:  08/26/2016  Welcome to the [facility].  Our goal is to provide you with an individualized program based on your diagnosis and situation, designed to meet your specific needs.  With this comprehensive rehabilitation program, you will be expected to participate in at least 3 hours of rehabilitation therapies Monday-Friday, with modified therapy programming on the weekends.  Your rehabilitation program will include the following services:  Physical Therapy (PT), Occupational Therapy (OT), Speech Therapy (ST), 24 hour per day rehabilitation nursing, Therapeutic Recreaction (TR), Neuropsychology, Case Management (Social Worker), Rehabilitation Medicine, Nutrition Services and Pharmacy Services  Weekly team conferences will be held on Wednesday to discuss your progress.  Your Social Worker will talk with you frequently to get your input and to update you on team discussions.  Team conferences with you and your family in attendance may also be held.  Expected length of stay: 23-25 days Overall anticipated outcome: min/ some mod assist level  Depending on your progress and recovery, your program may change. Your Social Worker will coordinate services and will keep you informed of any changes. Your Social Worker's name and contact numbers are listed  below.  The following services may also be recommended but are not provided by the [facility] will be made to provide these services after discharge if needed.  Arrangements include referral to agencies that provide these services.  Your insurance has been verified to be:  Pending medicaid Your primary doctor is:  [facility]  Pertinent information will be shared with your doctor and  your insurance company.  Social Worker:  Ovidio Kin, [place] or ([phone]  Information discussed with and copy given to patient by: Elease Hashimoto, 08/26/2016, 1:58 PM

## 2016-08-26 NOTE — Progress Notes (Signed)
Subjective/Complaints:    Patient is dysarthric, lethargic, just getting up from sleep. No complaints. Reviewed Doppler venous ultrasound with comparison to prior study. No lower extremity complaints   ROS   Denies chest pain, shortness of breath, nausea, vomiting, diarrhea, constipation.  Objective: Vital Signs: Blood pressure 138/68, pulse (!) 102, temperature 98.3 F (36.8 C), temperature source Oral, resp. rate 18, height 5\' 9"  (1.753 m), weight 93.6 kg (206 lb 5.6 oz), SpO2 95 %. US Venous Img Lower Unilateral Left  Result Date: 08/24/2016 CLINICAL DATA:  Recent LEFT lower extremity deep venous thrombosis. LEFT-sided weakness. EXAM: LOWER EXTREMITY VENOUS DOPPLER ULTRASOUND TECHNIQUE: Gray-scale sonography with graded compression, as well as color Doppler and duplex ultrasound were performed to evaluate the lower extremity deep venous systems from the level of the common femoral vein and including the common femoral, femoral, profunda femoral, popliteal and calf veins including the posterior tibial, peroneal and gastrocnemius veins when visible. The superficial great saphenous vein was also interrogated. Spectral Doppler was utilized to evaluate flow at rest and with distal augmentation maneuvers in the common femoral, femoral and popliteal veins. COMPARISON:  None. FINDINGS: Contralateral Common Femoral Vein: Respiratory phasicity is normal and symmetric with the symptomatic side. No evidence of thrombus. Normal compressibility. Common Femoral Vein: Nonocclusive thrombus.  Non compressible Saphenofemoral Junction: No evidence of thrombus. Normal compressibility and flow on color Doppler imaging. Profunda Femoral Vein: No evidence of thrombus. Normal compressibility and flow on color Doppler imaging. Femoral Vein: Nonocclusive thrombus.  Non compressible. Popliteal Vein: No evidence of thrombus. Normal compressibility, respiratory phasicity and response to augmentation. Calf Veins: No  evidence of thrombus. Normal compressibility and flow on color Doppler imaging. Superficial Great Saphenous Vein: No evidence of thrombus. Normal compressibility and flow on color Doppler imaging. IMPRESSION: Nonocclusive thrombosis within the LEFT common femoral vein and femoral vein. Decreased clot burden to ultrasound 05/04/2016. No new thrombus present. Electronically Signed   By: Suzy Bouchard M.D.   On: 08/24/2016 12:19   Results for orders placed or performed during the hospital encounter of 08/25/16 (from the past 72 hour(s))  Glucose, capillary     Status: Abnormal   Collection Time: 08/25/16  4:55 PM  Result Value Ref Range   Glucose-Capillary 251 (H) 65 - 99 mg/dL  Glucose, capillary     Status: Abnormal   Collection Time: 08/25/16  9:32 PM  Result Value Ref Range   Glucose-Capillary 213 (H) 65 - 99 mg/dL     HEENT: normal Cardio: RRR and No murmur Resp: CTA B/L and Unlabored GI: BS positive and Nontender, nondistended Extremity:  No Edema Skin:   Intact Neuro: Lethargic, Flat, Abnormal Sensory Reduced sensation to pinch in left upper extremity greater than left lower extremity. Intact sensation on the right, Abnormal Motor Motor is 5/5 in the right deltoid, biceps, triceps, grip, hip flexor, knee extensor, ankle dorsiflexor, trace left biceps and finger flexors, 2 minus, left hip, knee extensor synergy, 0 at the ankle, Abnormal FMC Ataxic/ dec FMC and Dysarthric Musc/Skel:  Other No pain with upper extremity or lower extremity range of motion Gen. no acute distress   Assessment/Plan: 1. Functional deficits secondary to Right MCA infarct with Left Hemiparesis which require 3+ hours per day of interdisciplinary therapy in a comprehensive inpatient rehab setting. Physiatrist is providing close team supervision and 24 hour management of active medical problems listed below. Physiatrist and rehab team continue to assess barriers to discharge/monitor patient progress toward  functional and medical goals. FIM:  Medical Problem List and Plan: 1.  Left hemiplegia, limitations with self-care, difficulty with transfers secondary to right MCA CVA. Initiate CIR PT, OT, speech. Today 2.  DVT Prophylaxis/Anticoagulation: Pharmaceutical: Lovenox 3. Pain Management: N/A 4. Mood: Team to provide ego support. Expressing anxiety about current situation. Has supportive family. LCSW to follow for evaluation and support.  5. Neuropsych: This patient is capable of making decisions on his own behalf. 6. Skin/Wound Care: routine pressure relief measures.  7. Fluids/Electrolytes/Nutrition: Monitor I/O. Check lytes in am. Encourage fluid intake.  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 9. T2DM: Hgb A1C- 10.5 --question compliance. Continue metformin bid. Was started on lantus with SSI for elevated BS--will consult RD for diabetic education and start education on insulin administration.  Will monitor BS ac/hs 10. HTN: Monitor BP bid--on lisinopril daily.  Vitals:   08/26/16 0558 08/26/16 0905  BP: 138/68 (!) 152/82  Pulse: (!) 102 86  Resp: 18   Temp: 98.3 F (36.8 C)    11. Leucocytosis: Monitor for signs of infection. UA negative.  12. Polycythemia: Smokes 1.5 PPD. Recheck in am.  13. COPD: SOB reported--CXR negative. Encourage tobacco cessation.  Managed without medications at this time.   lung exam unremarkable.at the current time 14. Tobacco abuse: Counsel  LOS (Days) 1 A FACE TO FACE EVALUATION WAS PERFORMED  Maurissa Ambrose E 08/26/2016, 6:23 AM

## 2016-08-26 NOTE — Plan of Care (Signed)
Problem: RH SAFETY Goal: RH STG DEMO UNDERSTANDING HOME SAFETY PRECAUTIONS Pt's family

## 2016-08-26 NOTE — Plan of Care (Signed)
Problem: Food- and Nutrition-Related Knowledge Deficit (NB-1.1) Goal: Nutrition education Formal process to instruct or train a patient/client in a skill or to impart knowledge to help patients/clients voluntarily manage or modify food choices and eating behavior to maintain or improve health.  Outcome: Completed/Met Date Met: 08/26/16  RD consulted for nutrition education.   Lab Results  Component Value Date   HGBA1C 10.5 (H) 08/22/2016    RD provided "Carbohydrate Counting for People with Diabetes" as well as "Heart Healthy Nutrition Therapy" handout from the Academy of Nutrition and Dietetics. Discussed different food groups and their effects on blood sugar, emphasizing carbohydrate-containing foods. Provided list of carbohydrates and recommended serving sizes of common foods.  Discussed importance of controlled and consistent carbohydrate intake throughout the day. Provided examples of ways to balance meals/snacks and encouraged intake of high-fiber, whole grain complex carbohydrates. Provided examples on ways to decrease sodium and fat intake in diet. Discouraged intake of processed foods and use of salt shaker. Discussed diabetic friendly drink options. Teach back method used.  Expect good compliance.  Body mass index is 30.47 kg/m. Pt meets criteria for class I obesity based on current BMI.  Current diet order is dysphagia 3 diet/heart healthy carbohydrate modified, patient is consuming approximately 75-100% of meals at this time. Pt reports appetite is fine currently and PTA with usual consumption of at least 3 meals a day. Labs and medications reviewed. No further nutrition interventions warranted at this time. RD contact information provided. If additional nutrition issues arise, please re-consult RD.  Corrin Parker, MS, RD, LDN Pager # [phone] After hours/ weekend pager # [phone]

## 2016-08-26 NOTE — Progress Notes (Signed)
Social Work Assessment and Plan Social Work Assessment and Plan  Patient Details  Name: Chad Combs MRN: [id number] Date of Birth: [birth year]  Today's Date: 08/26/2016  Problem List:  Patient Active Problem List   Diagnosis Date Noted  . Acute right MCA stroke ([place]) 08/25/2016  . Left hemiplegia ([place])   . Dysarthria, post-stroke   . Dysphagia, post-stroke   . Occlusion and stenosis of carotid artery   . Diabetes mellitus ([place])   . Benign essential HTN   . Leukocytosis   . Polycythemia vera ([place])   . Tobacco abuse   . Chronic obstructive pulmonary disease ([place])   . CVA (cerebral vascular accident) ([place]) 08/21/2016  . Hypertension 07/04/2015  . Diabetes mellitus without complication ([place]) 62/70/3500  . 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 COPD (chronic obstructive pulmonary disease) ([place]) 07/04/2015   Past Medical History:  Past Medical History:  Diagnosis Date  . Blind right eye   . COPD (chronic obstructive pulmonary disease) ([place])   . Diabetes mellitus without complication ([place])   . Hypertension    Past Surgical History:  Past Surgical History:  Procedure Laterality Date  . IVC FILTER INSERTION N/A 08/24/2016   Procedure: IVC Filter Insertion;  Surgeon: Katha Cabal, MD;  Location: [facility];  Service: Cardiovascular;  Laterality: N/A;   Social History:  reports that he has been smoking Cigarettes.  He has a 67.50 pack-year smoking history. He has never used smokeless tobacco. He reports that he drinks alcohol. He reports that he does not use drugs.  Family / Support Systems Marital Status: Divorced Patient Roles: Parent Children: Chad Combs [phone] Other Supports: Chad Combs Anticipated Caregiver: Chad Combs and pt's sister Ability/Limitations of Caregiver: Chad Combs has committed to being with hism 2 weeks, pt's siste rhas committed to being with him one week and Will works during the day so can help at night Caregiver Availability: Other (Comment)  (Has someone with him for three weeks unsure after this time.) Family Dynamics: Pt is close with his son's, one lives 7 minutes from and the other is in [place]. He was independent until his first stroke in 2015, when he lost his eye sight in his right eye. He wants to recover from this and make good progress while here.  Social History Preferred language: English Religion:  Cultural Background: No issues Education: High School-truck driving school Read: Yes Write: Yes Employment Status: Disabled Date Retired/Disabled/Unemployed: 2015 after first stroke-loss of eye sight in right eye Legal Hisotry/Current Legal Issues: Appealing the disability decision for his SSD-has law firm Chad Combs in [place] working on this Guardian/Conservator: None-according to MD pt is not capable of making his own decisions while here. Will reports he is pt's POA so will look toward him if any decisions need to be made   Abuse/Neglect Physical Abuse: Denies Verbal Abuse: Denies Sexual Abuse: Denies Exploitation of patient/patient's resources: Denies Self-Neglect: Denies  Emotional Status Pt's affect, behavior adn adjustment status: Pt is wanting to improve and recover from this stroke. He realizes it is much worse than his first one. He wants to get back home and not burden his children with his care. He has always been one to take care of himself and not ask others for assistance. 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 Applied for SSD denied and now in appeal Pyschiatric History: No history son feels he is depressed  now realizes how bad the stroke was. Will ask neuro-psych to see him for coping and possibly an antidepressant. Will make referral to be seen next week. Substance Abuse History: Tobacco continued to smoke1-2 packs a day. At moment no medication for. Pt aware of his need to quit  smoking  Patient / Family Perceptions, Expectations & Goals Pt/Family understanding of illness & functional limitations: Pt and son have a basic understanding of his stroke and deficits. Son has spoken with the MD and feels has a good understanding of his plan from here. Premorbid pt/family roles/activities: Dad, Sibling, friend, etc Anticipated changes in roles/activities/participation: resume Pt/family expectations/goals: Pt states: I have to get better than this."  Son states: " He needs to do a lot better before going home."  [employer]: Other (Comment) (Appealing disability) Premorbid Home Care/DME Agencies: None Transportation available at discharge: Son Resource referrals recommended: Neuropsychology, Support group (specify)  Discharge Planning Living Arrangements: Alone Support Systems: Children, Other relatives, Friends/neighbors Type of Residence: Private residence Insurance Resources: Medicaid (specify county) (Pending [employer]) Financial Resources: Family Support Financial Screen Referred: Yes Living Expenses: Education officer, community Management: Patient, Family Does the patient have any problems obtaining your medications?: Yes (Describe) (uninsured difficulty paying for medications) Home Management: Self Patient/Family Preliminary Plans: Plan currently is to go home and have his son stay two weeks and sister stay one week, then unsure what the plan will be. His other son works during the day and can only assist at night. Pt has severe deficits and will be a lot of care at discharge. Will see how he progresses in therapies, since he has no isnurance may need to get permission for LOG and go to a NH if this would be an option. Social Work Anticipated Follow Up Needs: HH/OP, SNF, Support Group  Clinical Impression Motivated gentleman who is willing to work but has had a severe CVA and has severe deficits, which add onto his deficits from his first stroke. Local  son is supportive but not able to provide assist, he has worked out three weeks of Someone to be there with him. Will ask him to see if his brother can stay longer than two weeks since pt may need long term assist. Son is working on his disability appeal with the help of a law firm in [place]. Will see therapy team evaluation's and work on a safe discharge plan. Pt would benefit from seeing neuro-psych for his high risk of becoming depressed due to stroke and deficits-son feels he is already.  Elease Hashimoto 08/26/2016, 2:54 PM

## 2016-08-26 NOTE — Evaluation (Signed)
Physical Therapy Assessment and Plan  Patient Details  Name: Chad Combs MRN: [id number] Date of Birth: 22-Jan-1956  PT Diagnosis: Abnormal posture, Abnormality of gait, Cognitive deficits, Hemiparesis dominant, Hypotonia and Impaired sensation Rehab Potential: Good ELOS: 25-28 days   Today's Date: 08/26/2016 PT Individual Time: 1300-1400 PT Individual Time Calculation (min): 60 min    Problem List:  Patient Active Problem List   Diagnosis Date Noted  . Acute right MCA stroke ([place]) 08/25/2016  . Left hemiplegia ([place])   . Dysarthria, post-stroke   . Dysphagia, post-stroke   . Occlusion and stenosis of carotid artery   . Diabetes mellitus ([place])   . Benign essential HTN   . Leukocytosis   . Polycythemia vera ([place])   . Tobacco abuse   . Chronic obstructive pulmonary disease ([place])   . CVA (cerebral vascular accident) ([place]) 08/21/2016  . Hypertension 07/04/2015  . Diabetes mellitus without complication ([place]) 60/63/0160  . 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 COPD (chronic obstructive pulmonary disease) ([place]) 07/04/2015    Past Medical History:  Past Medical History:  Diagnosis Date  . Blind right eye   . COPD (chronic obstructive pulmonary disease) ([place])   . Diabetes mellitus without complication ([place])   . Hypertension    Past Surgical History:  Past Surgical History:  Procedure Laterality Date  . IVC FILTER INSERTION N/A 08/24/2016   Procedure: IVC Filter Insertion;  Surgeon: Katha Cabal, MD;  Location: [facility];  Service: Cardiovascular;  Laterality: N/A;    Assessment & Plan Clinical Impression: Chad Combs an 61 y.o.Left handed malewith history of COPD, DM, HTN, CVA with loss of vision right eye,  LLE DVT 11/17 (ran out of Eliquis) who was admitted to [facility] after found by family on 08/21/16 with fall, left sided weakness and confusion.   History taken from chart review and son.  UDS negative. CT head done with evolving acute infarct in right parietal and  temporal lobes and indeterminate minimally displaced fracture thorough tip of nasal bone. MRI reviewed showing right MCA infarct. MRA brain done revealing occlusion of R-ICA and R-MCA with acute to early subacute large R-MCA infarct. 2 D echo with EF 60% and hypokinesis of anteroseptal myocardium suggestive of CAD.  Carotid dopplers with occlusion of R-ICA with no detectable flow in the neck beyond its origin and significant plaque at level of left carotid bulb and proximal IC causing significant proximal ICA stenosis > 70%. Dr. Doy Mince evaluated patient and recommended adding Plavix to ASA for stroke prevention and to follow up with vascular for L-ICA stenosis Patient transferred to [facility] on 08/25/2016 .   Patient currently requires total with mobility secondary to muscle joint tightness, decreased cardiorespiratoy endurance, impaired timing and sequencing and abnormal tone, hemianopsia, decreased attention, decreased awareness, decreased problem solving, decreased safety awareness and decreased memory and decreased sitting balance, decreased standing balance, decreased postural control, hemiplegia and decreased balance strategies.  Prior to hospitalization, patient was modified independent  with mobility and lived  Alone in a House home.  Home access is  Level entry.  Patient will benefit from skilled PT intervention to maximize safe functional mobility, minimize fall risk and decrease caregiver burden for planned discharge home with 24 hour assist.  Anticipate patient will benefit from follow up [facility] at discharge.  PT - End of Session Endurance Deficit: Yes Endurance Deficit Description: pt somnolent, drifting off during session PT Assessment Rehab Potential (ACUTE/IP ONLY): Good Barriers to Discharge: [place] home environment;Decreased caregiver  support PT Patient demonstrates impairments in the following area(s): Balance;Endurance;Motor;Safety;Sensory PT Transfers Functional Problem(s): Bed  Mobility;Bed to Chair;Car;Furniture PT Locomotion Functional Problem(s): Ambulation;Wheelchair Mobility;Stairs PT Plan PT Intensity: Minimum of 1-2 x/day ,45 to 90 minutes PT Frequency: 5 out of 7 days PT Duration Estimated Length of Stay: 25-28 days PT Treatment/Interventions: Ambulation/gait training;Balance/vestibular training;Cognitive remediation/compensation;Discharge planning;Community reintegration;DME/adaptive equipment instruction;Functional electrical stimulation;Functional mobility training;Patient/family education;Neuromuscular re-education;Psychosocial support;Splinting/orthotics;Therapeutic Exercise;Therapeutic Activities;Stair training;UE/LE Strength taining/ROM;UE/LE Coordination activities;Visual/perceptual remediation/compensation;Wheelchair propulsion/positioning PT Transfers Anticipated Outcome(s): min assist basic and car PT Locomotion Anticipated Outcome(s): supervision w/c x 150' controlled env and 50' home env; gait TBD PT Recommendation Follow Up Recommendations: Home health PT Patient destination: Home (pt states his son plans to stay home with him x 2 weeks at d/c) Equipment Recommended: To be determined  Skilled Therapeutic Intervention- eval completed.  ELOS and LTGs explained to pt.  Pt was safe with use of Stedy with max assist bed> Stedy.  Standing in parallel bars attempted, but pt pushed extremely hard with RUE and RLE making this unsafe. W/c propulsion using hemi technique, but pt had difficulty reaching the floor with his R foot due to standard ht of w/c.  neuromuscular re-education via forced use, multimodal cues for alternating reciprocal movement bil LEs on Kinetron in sitting.  Pt stated he needed to use toilet for BM.  Maudry Mayhew, RN and this PT used Stedy to transfer pt to toilet, but pt unsafely pushed his buttocks off to L of toilet.  Pt returned to bed via Stedy.  Pt left pt in care of Maudry Mayhew, RN and [place], [place].    PT  Evaluation Precautions/Restrictions Precautions Precautions: Fall Precaution Comments: L sided neglect, shoulder increased risk for subluxation; quick LOB L wihtout awareness Restrictions Weight Bearing Restrictions: No General   Vital SignsTherapy Vitals Temp: 98.3 F (36.8 C) Temp Source: Oral Pulse Rate: (!) 102 (RN notified) Resp: 18 BP: 134/82 Patient Position (if appropriate): Lying Oxygen Therapy SpO2: 93 % O2 Device: Not Delivered Pain Pain Assessment Pain Assessment: No/denies pain Pain Score: Asleep Home Living/Prior Functioning Home Living Living Arrangements: Alone Available Help at Discharge: Family Type of Home: House Home Access: Level entry Home Layout: One level Bathroom Shower/Tub: Chiropodist: Standard Bathroom Accessibility:  (TBD)  Lives With: Alone Prior Function Level of Independence: Independent with basic ADLs;Independent with gait  Able to Take Stairs?:  (NA) Driving: Yes Vocation: On disability Leisure: Hobbies-no Comments: independent in ADLs and IADLs Vision/Perception  Vision - Assessment Eye Alignment: Impaired (comment) (right eye deviates right) Ocular Range of Motion: Within Functional Limits (for left eye) Alignment/Gaze Preference: Head turned (head turn to the right) Tracking/Visual Pursuits: Decreased smoothness of horizontal tracking Convergence: Within functional limits (WFL left)  Cognition Overall Cognitive Status: Impaired/Different from baseline Arousal/Alertness: Awake/alert Orientation Level: Oriented X4 Attention: Sustained Sustained Attention: Impaired Sustained Attention Impairment: Verbal complex;Functional complex Memory: Impaired Memory Impairment: Decreased recall of new information Awareness: Impaired Awareness Impairment: Emergent impairment Problem Solving: Impaired Problem Solving Impairment: Verbal complex;Functional complex Behaviors: Impulsive Safety/Judgment:  Impaired Sensation Sensation Light Touch: Impaired Detail Light Touch Impaired Details: Impaired LUE Stereognosis: Not tested Hot/Cold: Not tested Proprioception: Not tested Additional Comments: Pt able to detect light touch in the upper arm but was not able to consistently determine below the elbow.  Could not feet deep pressure in the finger either.  Coordination Gross Motor Movements are Fluid and Coordinated: No Fine Motor Movements are Fluid and Coordinated: No Coordination and Movement Description: Pt currently Brunnstrum stage II in  the left arm and stage I in the hand.  He needed max hand over hand assistance for integration as an active assist with washing the RUE during ADL session.  Heel Shin Test: unable LLE Motor  Motor Motor: Hemiplegia;Abnormal postural alignment and control Motor - Skilled Clinical Observations: hypotonic LLE  Mobility Bed Mobility Bed Mobility: Rolling Right;Rolling Left;Left Sidelying to Sit Rolling Right: 3: Mod assist Rolling Right Details: Manual facilitation for weight shifting;Manual facilitation for placement;Verbal cues for technique Rolling Left: 5: Supervision;With rail Rolling Left Details: Visual cues/gestures for sequencing Left Sidelying to Sit: 2: Max assist Left Sidelying to Sit Details: Manual facilitation for placement;Manual facilitation for weight shifting;Verbal cues for precautions/safety;Verbal cues for sequencing Transfers Transfers: Yes Sit to Stand: 1: +1 Total assist;With upper extremity assist;From bed Sit to Stand Details: Manual facilitation for weight shifting;Tactile cues for weight beaing;Verbal cues for technique Stand to Sit: 1: +1 Total assist;With upper extremity assist;To bed Stand to Sit Details (indicate cue type and reason): Manual facilitation for weight shifting;Tactile cues for weight beaing;Verbal cues for technique Transfer via Lift Equipment: Stedy (max assist; pt pulled up with R hand) Locomotion   Ambulation Ambulation: No Gait Gait: No Stairs / Additional Locomotion Stairs: No Architect: Yes Wheelchair Assistance: 3: Mod assist Wheelchair Propulsion: Right upper extremity;Right lower extremity (pt in standard w/c) Wheelchair Parts Management: Needs assistance Distance: 100  Trunk/Postural Assessment  Cervical Assessment Cervical Assessment: Exceptions to [facility] (Slight cervical rotation to the right) Thoracic Assessment Thoracic Assessment: Exceptions to [facility] (Rounded shoulders with increased passive trunk elongation on the left and active shortening on the right. ) Lumbar Assessment Lumbar Assessment: Exceptions to [facility] (increased posterior pelvic tilt in sitting) Postural Control Postural Control: Deficits on evaluation (Frequent LOB to the left with max assist to self correct) Righting Reactions: LOB L with poor awareness and no trunk righting noted  Balance Balance Balance Assessed: Yes Static Sitting Balance Static Sitting - Balance Support: Feet supported;Right upper extremity supported Static Sitting - Level of Assistance: 3: Mod assist Dynamic Sitting Balance Dynamic Sitting - Balance Support: No upper extremity supported;During functional activity Dynamic Sitting - Level of Assistance: 2: Max assist Sitting balance - Comments: Frequent LOB to the left. Static Standing Balance Static Standing - Balance Support: During functional activity Static Standing - Level of Assistance: 1: +1 Total assist Dynamic Standing Balance Dynamic Standing - Balance Support: During functional activity Dynamic Standing - Level of Assistance: 1: +2 Total assist;Patient percentage (comment);Other (comment) (Pt 20%) Extremity Assessment      RLE Assessment RLE Assessment: Within Functional Limits (tight heel cord) LLE Assessment LLE Assessment: Exceptions to [facility] LLE Strength LLE Overall Strength Comments: grossly in supine: hip flex/ext/abd/add 2-/5;  knee flex/ext and ankle DF/PF 0/5   See Function Navigator for Current Functional Status.   Refer to Care Plan for Long Term Goals  Recommendations for other services: None  and Therapeutic Recreation  Pet therapy  Discharge Criteria: Patient will be discharged from PT if patient refuses treatment 3 consecutive times without medical reason, if treatment goals not met, if there is a change in medical status, if patient makes no progress towards goals or if patient is discharged from hospital.  The above assessment, treatment plan, treatment alternatives and goals were discussed and mutually agreed upon: by patient  Levert Heslop 08/26/2016, 4:53 PM

## 2016-08-27 ENCOUNTER — Inpatient Hospital Stay (HOSPITAL_COMMUNITY): Payer: Self-pay

## 2016-08-27 ENCOUNTER — Inpatient Hospital Stay (HOSPITAL_COMMUNITY): Payer: Self-pay | Admitting: Speech Pathology

## 2016-08-27 ENCOUNTER — Inpatient Hospital Stay (HOSPITAL_COMMUNITY): Payer: Self-pay | Admitting: Physical Therapy

## 2016-08-27 ENCOUNTER — Inpatient Hospital Stay (HOSPITAL_COMMUNITY): Payer: Self-pay | Admitting: Occupational Therapy

## 2016-08-27 DIAGNOSIS — D72825 Bandemia: Secondary | ICD-10-CM

## 2016-08-27 LAB — GLUCOSE, CAPILLARY
GLUCOSE-CAPILLARY: 190 mg/dL — AB (ref 65–99)
Glucose-Capillary: 202 mg/dL — ABNORMAL HIGH (ref 65–99)
Glucose-Capillary: 269 mg/dL — ABNORMAL HIGH (ref 65–99)
Glucose-Capillary: 214 mg/dL — ABNORMAL HIGH (ref 65–99)

## 2016-08-27 MED ORDER — INSULIN GLARGINE 100 UNIT/ML ~~LOC~~ SOLN
25.0000 [IU] | Freq: Every day | SUBCUTANEOUS | Status: DC
  Administered 2016-08-28 – 2016-08-29 (×2): 25 [IU] via SUBCUTANEOUS
  Filled 2016-08-27 (×2): qty 0.25

## 2016-08-27 MED ORDER — PRO-STAT SUGAR FREE PO LIQD
30.0000 mL | Freq: Two times a day (BID) | ORAL | Status: DC
  Administered 2016-08-27 – 2016-10-01 (×67): 30 mL via ORAL
  Filled 2016-08-27 (×71): qty 30

## 2016-08-27 MED ORDER — INSULIN STARTER KIT- PEN NEEDLES (ENGLISH)
1.0000 | Freq: Once | Status: DC
  Filled 2016-08-27 (×2): qty 1

## 2016-08-27 NOTE — Progress Notes (Signed)
Speech Language Pathology Daily Session Note  Patient Details  Name: Chad Combs MRN: [id number] Date of Birth: [birth year]  Today's Date: 08/27/2016 SLP Individual Time: 4035-2481 SLP Individual Time Calculation (min): 45 min  Short Term Goals: Week 1: SLP Short Term Goal 1 (Week 1): Given Mod A verbal cues, pt will utilize external memory aids to recall new daily information.  SLP Short Term Goal 2 (Week 1): Pt will utilize compensatory intelligibility strategies with Mod A verbal cues to achieve 75% intelligibility at the sentence level.  SLP Short Term Goal 3 (Week 1): Pt will demonstrate sustained attention in mildly distracting environment for ~30 minutes with Mod A verbal cues.  SLP Short Term Goal 4 (Week 1): Pt will complete basic, familiar problem solving tasks with Mod A verbal cues.  SLP Short Term Goal 5 (Week 1): Pt will scan to left of his environment with Mod A verbal cues.  SLP Short Term Goal 6 (Week 1): Pt will consume current diet without overt s/s of aspiration with Mod A verbal cues.   Skilled Therapeutic Interventions: Skilled treatment session focused on cognition goals. SLP facilitated session by providing Max A multimodal cues to scan left of midline to locate all items within therapy tasks. When 2 comparison picture cards were present pt only commented on items to the right of each card. Pt required Max A multimodal cues to name differences among 2 cards, pt continued to state items that were the same. Pt able to sustain attention for ~10 minutes in mildly distracting environment with Min A verbal cues. Pt unable to locate letters to create 3 letter words. Pt continues at a very significant level of cognitive impairment. Son present during session and education provided. Additionally, all questions answered to pt and son's satisfaction. Of note, multiple staff members have reported to this SLP that pt is coughing on Dysphagia 3 meats. SLP clarified order to request finely  chopped meats with extra gravy. SLP and staff to closely monitor. Pt was left in bed, bed alarm on and all needs within reach. Son present. Continue with current plan of care.      Function:    Cognition Comprehension Comprehension assist level: Understands basic 75 - 89% of the time/ requires cueing 10 - 24% of the time  Expression   Expression assist level: Expresses basic 75 - 89% of the time/requires cueing 10 - 24% of the time. Needs helper to occlude trach/needs to repeat words.  Social Interaction Social Interaction assist level: Interacts appropriately 75 - 89% of the time - Needs redirection for appropriate language or to initiate interaction.  Problem Solving Problem solving assist level: Solves basic 50 - 74% of the time/requires cueing 25 - 49% of the time  Memory Memory assist level: Recognizes or recalls 50 - 74% of the time/requires cueing 25 - 49% of the time    Pain    Therapy/Group: Individual Therapy   Francie Keeling B. [name], M.S., CCC-SLP Speech-Language Pathologist   [name] 08/27/2016, 2:44 PM

## 2016-08-27 NOTE — Progress Notes (Signed)
Subjective/Complaints:    Patient without new issues overnight, he denies any problems in his therapies yesterday.    ROS   Denies chest pain, shortness of breath, nausea, vomiting, diarrhea, constipation.  Objective: Vital Signs: Blood pressure 134/82, pulse (!) 102, temperature 98.3 F (36.8 C), temperature source Oral, resp. rate 18, height 5' 9"  (1.753 m), weight 93.6 kg (206 lb 5.6 oz), SpO2 93 %. No results found. Results for orders placed or performed during the hospital encounter of 08/25/16 (from the past 72 hour(s))  Glucose, capillary     Status: Abnormal   Collection Time: 08/25/16  4:55 PM  Result Value Ref Range   Glucose-Capillary 251 (H) 65 - 99 mg/dL  Glucose, capillary     Status: Abnormal   Collection Time: 08/25/16  9:32 PM  Result Value Ref Range   Glucose-Capillary 213 (H) 65 - 99 mg/dL  CBC WITH DIFFERENTIAL     Status: Abnormal   Collection Time: 08/26/16  6:02 AM  Result Value Ref Range   WBC 12.0 (H) 4.0 - 10.5 K/uL   RBC 5.39 4.22 - 5.81 MIL/uL   Hemoglobin 18.1 (H) 13.0 - 17.0 g/dL   HCT 53.4 (H) 39.0 - 52.0 %   MCV 99.1 78.0 - 100.0 fL   MCH 33.6 26.0 - 34.0 pg   MCHC 33.9 30.0 - 36.0 g/dL   RDW 12.8 11.5 - 15.5 %   Platelets 166 150 - 400 K/uL   Neutrophils Relative % 64 %   Neutro Abs 7.6 1.7 - 7.7 K/uL   Lymphocytes Relative 27 %   Lymphs Abs 3.2 0.7 - 4.0 K/uL   Monocytes Relative 8 %   Monocytes Absolute 1.0 0.1 - 1.0 K/uL   Eosinophils Relative 1 %   Eosinophils Absolute 0.1 0.0 - 0.7 K/uL   Basophils Relative 0 %   Basophils Absolute 0.0 0.0 - 0.1 K/uL  Comprehensive metabolic panel     Status: Abnormal   Collection Time: 08/26/16  6:02 AM  Result Value Ref Range   Sodium 136 135 - 145 mmol/L   Potassium 3.8 3.5 - 5.1 mmol/L   Chloride 103 101 - 111 mmol/L   CO2 23 22 - 32 mmol/L   Glucose, Bld 212 (H) 65 - 99 mg/dL   BUN 19 6 - 20 mg/dL   Creatinine, Ser 1.20 0.61 - 1.24 mg/dL   Calcium 9.2 8.9 - 10.3 mg/dL   Total  Protein 7.0 6.5 - 8.1 g/dL   Albumin 3.3 (L) 3.5 - 5.0 g/dL   AST 44 (H) 15 - 41 U/L   ALT 79 (H) 17 - 63 U/L   Alkaline Phosphatase 61 38 - 126 U/L   Total Bilirubin 1.4 (H) 0.3 - 1.2 mg/dL   GFR calc non Af Amer >60 >60 mL/min   GFR calc Af Amer >60 >60 mL/min    Comment: (NOTE) The eGFR has been calculated using the CKD EPI equation. This calculation has not been validated in all clinical situations. eGFR's persistently <60 mL/min signify possible Chronic Kidney Disease.    Anion gap 10 5 - 15  Glucose, capillary     Status: Abnormal   Collection Time: 08/26/16  6:52 AM  Result Value Ref Range   Glucose-Capillary 215 (H) 65 - 99 mg/dL  Glucose, capillary     Status: Abnormal   Collection Time: 08/26/16 11:51 AM  Result Value Ref Range   Glucose-Capillary 248 (H) 65 - 99 mg/dL  Glucose, capillary  Status: Abnormal   Collection Time: 08/26/16  4:30 PM  Result Value Ref Range   Glucose-Capillary 188 (H) 65 - 99 mg/dL  Glucose, capillary     Status: Abnormal   Collection Time: 08/26/16  9:00 PM  Result Value Ref Range   Glucose-Capillary 237 (H) 65 - 99 mg/dL   Comment 1 Notify RN      HEENT: normal Cardio: RRR and No murmur Resp: CTA B/L and Unlabored GI: BS positive and Nontender, nondistended Extremity:  No Edema Skin:   Intact Neuro: Lethargic, Flat, Abnormal Sensory Reduced sensation to pinch in left upper extremity greater than left lower extremity. Intact sensation on the right, Abnormal Motor Motor is 5/5 in the right deltoid, biceps, triceps, grip, hip flexor, knee extensor, ankle dorsiflexor, trace left biceps and finger flexors, 2 minus, left hip, knee extensor synergy, 0 at the ankle, Abnormal FMC Ataxic/ dec FMC and Dysarthric Musc/Skel:  Other No pain with upper extremity or lower extremity range of motion Gen. no acute distress   Assessment/Plan: 1. Functional deficits secondary to Right MCA infarct with Left Hemiparesis which require 3+ hours per day of  interdisciplinary therapy in a comprehensive inpatient rehab setting. Physiatrist is providing close team supervision and 24 hour management of active medical problems listed below. Physiatrist and rehab team continue to assess barriers to discharge/monitor patient progress toward functional and medical goals. FIM: Function - Bathing Position: Sitting EOB Body parts bathed by helper: Right arm, Left arm, Chest, Abdomen, Front perineal area, Buttocks, Right upper leg, Left upper leg, Right lower leg, Left lower leg, Back  Function- Upper Body Dressing/Undressing What is the patient wearing?: Hospital gown Function - Lower Body Dressing/Undressing What is the patient wearing?: Non-skid slipper socks Non-skid slipper socks- Performed by helper: Don/doff right sock, Don/doff left sock  Function - Toileting Toileting steps completed by helper: Performs perineal hygiene, Adjust clothing after toileting (Pt on bedpan at start of session)     Function - Chair/bed transfer Chair/bed transfer method: Other Chair/bed transfer assist level: Maximal assist (Pt 25 - 49%/lift and lower) Chair/bed transfer assistive device: Mechanical lift Mechanical lift: Stedy Chair/bed transfer details: Manual facilitation for weight shifting, Verbal cues for technique, Manual facilitation for placement, Manual facilitation for weight bearing  Function - Locomotion: Wheelchair Will patient use wheelchair at discharge?: Yes Type: Manual Max wheelchair distance: 100 Assist Level: Moderate assistance (Pt 50 - 74%) Assist Level: Moderate assistance (Pt 50 - 74%) Turns around,maneuvers to table,bed, and toilet,negotiates 3% grade,maneuvers on rugs and over doorsills: No Function - Locomotion: Ambulation Ambulation activity did not occur: Safety/medical concerns (pt pushes toward hemi side; unsafe)  Function - Comprehension Comprehension: Auditory Comprehension assist level: Understands basic 75 - 89% of the time/  requires cueing 10 - 24% of the time  Function - Expression Expression: Verbal Expression assist level: Expresses basic 75 - 89% of the time/requires cueing 10 - 24% of the time. Needs helper to occlude trach/needs to repeat words.  Function - Social Interaction Social Interaction assist level: Interacts appropriately 75 - 89% of the time - Needs redirection for appropriate language or to initiate interaction.  Function - Problem Solving Problem solving assist level: Solves basic 50 - 74% of the time/requires cueing 25 - 49% of the time  Function - Memory Memory assist level: Recognizes or recalls 50 - 74% of the time/requires cueing 25 - 49% of the time Patient normally able to recall (first 3 days only): That he or she is in a  hospital, Current season, Staff names and faces  Medical Problem List and Plan: 1.  Left hemiplegia, limitations with self-care, difficulty with transfers secondary to right MCA CVA. Cont CIR PT, OT, speech.  2.  DVT Prophylaxis/Anticoagulation: Pharmaceutical: Lovenox 3. Pain Management: N/A 4. Mood: Team to provide ego support. Expressing anxiety about current situation. Has supportive family. LCSW to follow for evaluation and support.  5. Neuropsych: This patient is capable of making decisions on his own behalf. 6. Skin/Wound Care: routine pressure relief measures.  7. Fluids/Electrolytes/Nutrition: Monitor I/O. Check lytes in am. Encourage fluid intake.  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 9. T2DM: Hgb A1C- 10.5 --. Continue metformin bid. Was started on lantus adjust per am CBG, crease to 25 units daily at bedtime, with SSI for elevated BS--will consult RD for diabetic education and start education on insulin administration.  Will monitor BS ac/hs CBG (last 3)   Recent Labs  08/26/16 1151 08/26/16 1630 08/26/16 2100  GLUCAP 248* 188* 237*    10. HTN: Monitor BP bid--on lisinopril daily.  Vitals:   08/26/16 0905 08/26/16 1531  BP:  (!) 152/82 134/82  Pulse: 86 (!) 102  Resp:  18  Temp:  98.3 F (36.8 C)   11. Leucocytosis: Monitor for signs of infection. UA negative. Afebrile 12. Polycythemia: Smokes 1.5 PPD. Expect this to remain elevated during hospitalization 13. COPD: SOB reported--CXR negative. Encourage tobacco cessation.  Managed without medications at this time.   14. Tobacco abuse: Counsel  14. Hypoalbuminemia, start pro-stat LOS (Days) 2 A FACE TO FACE EVALUATION WAS PERFORMED  Dalani Mette E 08/27/2016, 5:22 AM

## 2016-08-27 NOTE — Progress Notes (Signed)
Physical Therapy Session Note  Patient Details  Name: Chad Combs MRN: [id number] Date of Birth: February 24, 1956  Today's Date: 08/27/2016 PT Individual Time: 0915-1015 PT Individual Time Calculation (min): 60 min   Short Term Goals: Week 1:  PT Short Term Goal 1 (Week 1): pt will roll R wiht mod assist PT Short Term Goal 2 (Week 1): pt will move supine> sit through R sidelying with mod assist PT Short Term Goal 3 (Week 1): pt will transfer with assistanc of 1 person consistently PT Short Term Goal 4 (Week 1): pt will propel appropriate ht w/c x 50' using hemi method with min assist PT Short Term Goal 5 (Week 1): pt will tolerate standing in static stander x 15 minutes.  Skilled Therapeutic Interventions/Progress Updates:   Pt received sitting in WC and agreeable to PT  PT transported pt to rehab gym in [facility] with total assist for time management. PT instructed patient in SB transfer to mat table with max assist and max cues for proper UEplacement, LE placement and sequencing.   Sitting balance EOM for pt to attempt urination with intermittent 1-0 UE support; unsuccessful with urination. Min Assist from PT provided for sitting balance with 1 UE support and mod assist without UE support.   Bed mobility for sit<>supine x 2 with mod assist form PT to control the LUE and LLE. Rolling L and R x 2 each with mod assist from PT with set up required for proper LUE placement. Supine NMR for LLE and pelvic control including Bridges, hip flexion/extension with synergistic pattern, L hip add/abduction, lumbar rotation L and R with mod assist.   Squat pivot transfer to the R with max assist from PT and max cues for sequencing and positioning.  PT fit patient for hemiheight WC for improved positioning to allow increased safety with transfers and mobility with Hemi height technique. WC mobility with mod assist progressing to min assist with hemi technique.   Patient returned to room and left sitting in [facility] with  call bell in reach and all needs met.        Therapy Documentation Precautions:  Precautions Precautions: Fall Precaution Comments: L sided neglect, shoulder increased risk for subluxation; quick LOB L wihtout awareness Restrictions Weight Bearing Restrictions: No Vital Signs: Therapy Vitals Temp: 98.9 F (37.2 C) Temp Source: Oral Pulse Rate: (!) 103 Resp: 16 BP: 128/84 Patient Position (if appropriate): Lying Oxygen Therapy SpO2: 94 % O2 Device: Not Delivered Pain:    0/10  See Function Navigator for Current Functional Status.   Therapy/Group: Individual Therapy  [name] 08/27/2016, 7:55 AM

## 2016-08-27 NOTE — IPOC Note (Signed)
Overall Plan of Care [facility]) Patient Details Name: Chad Combs MRN: [id number] DOB: Feb 20, 1956  Admitting Diagnosis: R CVA  Hospital Problems: Active Problems:   Acute right MCA stroke (HCC)   Left hemiplegia (HCC)   Dysarthria, post-stroke   Dysphagia, post-stroke   Occlusion and stenosis of carotid artery   Diabetes mellitus (HCC)   Benign essential HTN   Leukocytosis   Polycythemia vera ([place])   Tobacco abuse   Chronic obstructive pulmonary disease ([place])     Functional Problem List: Nursing Safety, Sensory, Skin Integrity, Medication Management, Motor, Nutrition, Endurance, Bowel, Bladder  PT Balance, Endurance, Motor, Safety, Sensory  OT Balance, Cognition, Motor, Endurance, Perception, Vision, Sensory, Safety  SLP Cognition, Safety, Perception  TR         Basic ADL's: OT Eating, Grooming, Bathing, Dressing, Toileting     Advanced  ADL's: OT       Transfers: PT Bed Mobility, Bed to Chair, Car, Manufacturing systems engineer, Metallurgist: PT Ambulation, Emergency planning/management officer, Stairs     Additional Impairments: OT Fuctional Use of Upper Extremity  SLP Swallowing, Communication, Social Cognition expression Problem Solving, Memory, Attention, Awareness  TR      Anticipated Outcomes Item Anticipated Outcome  Self Feeding modified independent  Swallowing  Supervision   Basic self-care  min assist  Toileting  min assist   Bathroom Transfers min assist  Bowel/Bladder  Continent bowel and bladder, no retention,no S/S infection.  Transfers  min assist basic and car  Locomotion  supervision w/c x 150' controlled env and 50' home env; gait TBD  Communication  Supervision  Cognition  Supervision  Pain  Managed at goal 2/10.  Safety/Judgment  Increased safety awareness, no falls, injury this admission.   Therapy Plan: PT Intensity: Minimum of 1-2 x/day ,45 to 90 minutes PT Frequency: 5 out of 7 days PT Duration Estimated Length of Stay: 25-28  days OT Intensity: Minimum of 1-2 x/day, 45 to 90 minutes OT Frequency: 5 out of 7 days OT Duration/Estimated Length of Stay: 23-25 days SLP Intensity: Minumum of 1-2 x/day, 30 to 90 minutes SLP Frequency: 3 to 5 out of 7 days SLP Duration/Estimated Length of Stay: 3 to 4 weeks       Team Interventions: Nursing Interventions Patient/Family Education, Skin Care/Wound Management, Disease Management/Prevention, Discharge Planning, Psychosocial Support, Bowel Management, Bladder Management, Medication Management, Dysphagia/Aspiration Precaution Training  PT interventions Ambulation/gait training, Balance/vestibular training, Cognitive remediation/compensation, Discharge planning, Community reintegration, DME/adaptive equipment instruction, Functional electrical stimulation, Functional mobility training, Patient/family education, Neuromuscular re-education, Psychosocial support, Splinting/orthotics, Therapeutic Exercise, Therapeutic Activities, Stair training, UE/LE Strength taining/ROM, UE/LE Coordination activities, Visual/perceptual remediation/compensation, Wheelchair propulsion/positioning  OT Interventions Training and development officer, Community reintegration, Cognitive remediation/compensation, Disease mangement/prevention, Discharge planning, DME/adaptive equipment instruction, Neuromuscular re-education, Functional mobility training, Functional electrical stimulation, Pain management, Patient/family education, Psychosocial support, Self Care/advanced ADL retraining, Splinting/orthotics, Therapeutic Activities, Therapeutic Exercise, UE/LE Strength taining/ROM, UE/LE Coordination activities, Visual/perceptual remediation/compensation, Wheelchair propulsion/positioning  SLP Interventions Cognitive remediation/compensation, Cueing hierarchy, Dysphagia/aspiration precaution training, Functional tasks, Medication managment, Patient/family education, Therapeutic Activities, Environmental controls,  Internal/external aids, Oral motor exercises, Therapeutic Exercise  TR Interventions    SW/CM Interventions Discharge Planning, Psychosocial Support, Patient/Family Education    Team Discharge Planning: Destination: PT-Home (pt states his son plans to stay home with him x 2 weeks at d/c) ,OT- Home , SLP-Home Projected Follow-up: PT-Home health PT, OT-  Home health OT, SLP-Home Health SLP Projected Equipment Needs: PT-To be determined, OT- 3 in 1 bedside  comode, Tub/shower bench, SLP-None recommended by SLP Equipment Details: PT- , OT-  Patient/family involved in discharge planning: PT- Patient,  OT-Patient, SLP-Patient  MD ELOS: 18-21d Medical Rehab Prognosis:  Excellent Assessment:  61 y.o.Left handed malewith history of COPD, DM, HTN, CVA with loss of vision right eye,  LLE DVT 11/17 (ran out of Eliquis) who was admitted to [facility] after found by family on 08/21/16 with fall, left sided weakness and confusion.   History taken from chart review and son.  UDS negative. CT head done with evolving acute infarct in right parietal and temporal lobes and indeterminate minimally displaced fracture thorough tip of nasal bone. MRI reviewed showing right MCA infarct. MRA brain done revealing occlusion of R-ICA and R-MCA with acute to early subacute large R-MCA infarct. 2 D echo with EF 60% and hypokinesis of anteroseptal myocardium suggestive of CAD.  Carotid dopplers with occlusion of R-ICA with no detectable flow in the neck beyond its origin and significant plaque at level of left carotid bulb and proximal IC causing significant proximal ICA stenosis > 70%. Dr. Doy Mince evaluated patient and recommended adding Plavix to ASA for stroke    Now requiring 24/7 Rehab RN,MD, as well as CIR level PT, OT and SLP.  Treatment team will focus on ADLs and mobility with goals set at See Team Conference Notes for weekly updates to the plan of care

## 2016-08-27 NOTE — Progress Notes (Signed)
Occupational Therapy Session Note  Patient Details  Name: Chad Combs MRN: [id number] Date of Birth: Jun 08, 1956  Today's Date: 08/27/2016 OT Individual Time: 5726-2035 OT Individual Time Calculation (min): 28 min   Short Term Goals: Week 1:  OT Short Term Goal 1 (Week 1): Pt will maintain static sitting balance for at least 5 mins with supervision in preparation for selfcare tasks.  OT Short Term Goal 2 (Week 1): Pt will complete UB bathing with min assist in unsupported sitting.  OT Short Term Goal 3 (Week 1): Pt will complete LB bathing with max assist sit to stand.  OT Short Term Goal 4 (Week 1): Pt will donn pullover shirt in supported sitting with min assist 2 consecutive sessions.  OT Short Term Goal 5 (Week 1): Pt will donn pullup pants with max assist sit to stand for 2 consecutive sessions.   Skilled Therapeutic Interventions/Progress Updates:    Pt seen supine in bed with son present and agreeable to tx. Pt supine>sitting EOB with MOD A for trunk facilitation and VC to hand RLE assist LLE to EOB. Pt demo MOD-MIN A and VC for weight shifting to maintain midline sitting EOB as son threads BLE into pants. Pt unable to assume seated figure 4 d/t B hip tightness. Pt demo total LOB posteriorly an to the L with TOTAL A to recover. Pt STS in steady with MOD A for lifting and helps OT advance pants over hips with VC to shift weight to R. Pt transfers in steady to w/c with TOTAL A and goes through self ROM exercise program. Educated pt and son on importance of maintaining ROM to decrease risk of contracture. Handout placed in stroke education notebook. Bed postioning handout reviewed with pt/family and placed above headboard. Exited session with pt seated in w/c with call light in place and LUE in arm trough.   Therapy Documentation Precautions:  Precautions Precautions: Fall Precaution Comments: L sided neglect, shoulder increased risk for subluxation; quick LOB L wihtout  awareness Restrictions Weight Bearing Restrictions: No  See Function Navigator for Current Functional Status.   Therapy/Group: Individual Therapy  Tonny Branch 08/27/2016, 6:27 PM

## 2016-08-27 NOTE — Progress Notes (Signed)
Occupational Therapy Session Note  Patient Details  Name: Chad Combs MRN: [id number] Date of Birth: 1956/01/05  Today's Date: 08/27/2016 OT Individual Time: 0800-0903 OT Individual Time Calculation (min): 63 min    Short Term Goals: Week 1:  OT Short Term Goal 1 (Week 1): Pt will maintain static sitting balance for at least 5 mins with supervision in preparation for selfcare tasks.  OT Short Term Goal 2 (Week 1): Pt will complete UB bathing with min assist in unsupported sitting.  OT Short Term Goal 3 (Week 1): Pt will complete LB bathing with max assist sit to stand.  OT Short Term Goal 4 (Week 1): Pt will donn pullover shirt in supported sitting with min assist 2 consecutive sessions.  OT Short Term Goal 5 (Week 1): Pt will donn pullup pants with max assist sit to stand for 2 consecutive sessions.   Skilled Therapeutic Interventions/Progress Updates:    Began with pt transferring to sitting from supine position with max assist to the left side of the bed.  Once sitting he was able to maintain static sitting balance with min assist.  Performed standing X 2 to take small steps up the left side of the bed for better positioning in order complete self feeding task.  He needed total assist to complete this.  Once sitting he was able to maintain sitting balance with min guard assist while working on eating breakfast.  Pt with decreased sustained attention, requiring mod questioning cues to take bites of his food as well as max instructional cueing to make them small and to not take too small of bites.  He mostly wanted to drink his juice and milk.  When he did take bites he used the non-affected RUE but still exhibited increased tremor, which he stated has been present for years.  Once breakfast was finished, he completed squat pivot transfer to the wheelchair from the EOB.  Max assist for transfer with mod demonstrational cueing to slow down and for sequencing as pt is impulsive at times.  Worked on  washing face, brushing teeth and hair, and donning new gown at the sink.  Increased LOB to the left noted when attempting to flex trunk forward for leaning over the sink.  Integrated the LUE with max hand over hand assist for opening the toothpaste.  He did demonstrate difficulty locating the toothbrush, toothpaste, and comb on the left side of the sink however and required mod instructional cueing to scan and locate them.  When removing hospital gown pt impulsively attempted to extend his hips out and up in order to increase space to remove it, putting his buttocks too close to the edge of the seat.  He needed max instructional cueing to stop secondary to decreased emergent awareness.  Once finished donning new gown pt was left in wheelchair with arm trough on the left side for support and chair alarm and belt in place for safety.  Call button and phone in reach on the right side.   Therapy Documentation Precautions:  Precautions Precautions: Fall Precaution Comments: L sided neglect, shoulder increased risk for subluxation; quick LOB L wihtout awareness Restrictions Weight Bearing Restrictions: No  Pain: Pain Assessment Pain Assessment: No/denies pain Pain Score: 0-No pain ADL: See Function Navigator for Current Functional Status.   Therapy/Group: Individual Therapy  Ashleyanne Hemmingway OTR/L 08/27/2016, 12:17 PM

## 2016-08-27 NOTE — Progress Notes (Addendum)
Inpatient Diabetes Program Recommendations  AACE/ADA: New Consensus Statement on Inpatient Glycemic Control (2015)  Target Ranges:  Prepandial:   less than 140 mg/dL      Peak postprandial:   less than 180 mg/dL (1-2 hours)      Critically ill patients:  140 - 180 mg/dL   Lab Results  Component Value Date   GLUCAP 202 (H) 08/27/2016   HGBA1C 10.5 (H) 08/22/2016    Review of Glycemic Control:  Results for Chad Combs, Chad Combs (MRN [id number]) as of 08/27/2016 11:11  Ref. Range 08/26/2016 11:51 08/26/2016 16:30 08/26/2016 21:00 08/27/2016 06:46  Glucose-Capillary Latest Ref Range: 65 - 99 mg/dL 248 (H) 188 (H) 237 (H) 202 (H)   Diabetes history: Type 2 diabetes Outpatient Diabetes medications: Metformin 1000 mg bid Current orders for Inpatient glycemic control:  Novolog resistant tid with meals and HS, Lantus 25 units q HS, Metformin 1000 mg bid  Inpatient Diabetes Program Recommendations:   Note that patient is new to insulin.  Needs insulin teaching while he is in rehab.  Will place order for diabetes videos and insulin teaching by bedside RN.   Thanks, Adah Perl, RN, BC-ADM Inpatient Diabetes Coordinator Pager [phone] Spoke with patient regarding home insulin. He states that he was on insulin prior to admit at home, however he is not sure of type.  He did state that he used insulin pen. Discussed with RN and encouraged reinforcement of teaching.  JS

## 2016-08-28 ENCOUNTER — Inpatient Hospital Stay (HOSPITAL_COMMUNITY): Payer: Self-pay | Admitting: Speech Pathology

## 2016-08-28 ENCOUNTER — Inpatient Hospital Stay (HOSPITAL_COMMUNITY): Payer: Self-pay | Admitting: Occupational Therapy

## 2016-08-28 ENCOUNTER — Inpatient Hospital Stay (HOSPITAL_COMMUNITY): Payer: Self-pay | Admitting: Physical Therapy

## 2016-08-28 LAB — GLUCOSE, CAPILLARY
GLUCOSE-CAPILLARY: 181 mg/dL — AB (ref 65–99)
GLUCOSE-CAPILLARY: 167 mg/dL — AB (ref 65–99)
Glucose-Capillary: 259 mg/dL — ABNORMAL HIGH (ref 65–99)
Glucose-Capillary: 163 mg/dL — ABNORMAL HIGH (ref 65–99)

## 2016-08-28 NOTE — Progress Notes (Signed)
Speech Language Pathology Daily Session Note  Patient Details  Name: Chad Combs MRN: [id number] Date of Birth: [birth year]  Today's Date: 08/28/2016 SLP Individual Time: 1200-1230 SLP Individual Time Calculation (min): 30 min  Short Term Goals: Week 1: SLP Short Term Goal 1 (Week 1): Given Mod A verbal cues, pt will utilize external memory aids to recall new daily information.  SLP Short Term Goal 2 (Week 1): Pt will utilize compensatory intelligibility strategies with Mod A verbal cues to achieve 75% intelligibility at the sentence level.  SLP Short Term Goal 3 (Week 1): Pt will demonstrate sustained attention in mildly distracting environment for ~30 minutes with Mod A verbal cues.  SLP Short Term Goal 4 (Week 1): Pt will complete basic, familiar problem solving tasks with Mod A verbal cues.  SLP Short Term Goal 5 (Week 1): Pt will scan to left of his environment with Mod A verbal cues.  SLP Short Term Goal 6 (Week 1): Pt will consume current diet without overt s/s of aspiration with Mod A verbal cues.   Skilled Therapeutic Interventions: Skilled treatment session focused on dysphagia and cognition goals. SLP facilitated session by providing skilled observation of pt consuming Dysphagia 3 lunch with thin liquids. Pt able to recall and use compensatory swallow strategies with Min A to supervision cues. Pt's meats were finely chopped and this appear to be better - no coughing noted. Pt required Min A verbal cues for sustained attention in mildly distracting environment for ~30 minutes and he required Mod A verbal cues to locate items to left of his tray. Pt was left with nursing to finish supervision of his meal. Continue per current plan of care.      Function:  Eating Eating   Modified Consistency Diet: Yes Eating Assist Level: Swallowing techniques: self managed;Set up assist for;Supervision or verbal cues   Eating Set Up Assist For: Opening containers       Cognition Comprehension  Comprehension assist level: Understands basic 75 - 89% of the time/ requires cueing 10 - 24% of the time  Expression   Expression assist level: Expresses basic 75 - 89% of the time/requires cueing 10 - 24% of the time. Needs helper to occlude trach/needs to repeat words.  Social Interaction Social Interaction assist level: Interacts appropriately 75 - 89% of the time - Needs redirection for appropriate language or to initiate interaction.  Problem Solving Problem solving assist level: Solves basic 75 - 89% of the time/requires cueing 10 - 24% of the time  Memory Memory assist level: Recognizes or recalls 75 - 89% of the time/requires cueing 10 - 24% of the time    Pain Pain Assessment Pain Score: 0-No pain  Therapy/Group: Individual Therapy   Angelyn Osterberg B. [name], M.S., CCC-SLP Speech-Language Pathologist   Jeniece Hannis Rio 08/28/2016, 12:20 PM

## 2016-08-28 NOTE — Progress Notes (Addendum)
Occupational Therapy Session Note  Patient Details  Name: CALHOUN REICHARDT MRN: [id number] Date of Birth: 01-May-1956  Today's Date: 08/28/2016 OT Individual Time:10:30-11:30  OT Individual Time Calculation (min): 60    Short Term Goals: Week 1:  OT Short Term Goal 1 (Week 1): Pt will maintain static sitting balance for at least 5 mins with supervision in preparation for selfcare tasks.  OT Short Term Goal 2 (Week 1): Pt will complete UB bathing with min assist in unsupported sitting.  OT Short Term Goal 3 (Week 1): Pt will complete LB bathing with max assist sit to stand.  OT Short Term Goal 4 (Week 1): Pt will donn pullover shirt in supported sitting with min assist 2 consecutive sessions.  OT Short Term Goal 5 (Week 1): Pt will donn pullup pants with max assist sit to stand for 2 consecutive sessions.   Skilled Therapeutic Interventions/Progress Updates: Patient seated in w/c having finished PT approximately 1.5 hours prior.   Though he stated he was fatigued, he concurred to participate in bathing and dressing seated in w/c for Upper body and then LB once after back in bed after transferring via Stedy with assist of one person.  Overall he completed bathing and dressing with max A.   He was able to maintain unsupported static balance on Stedy with close S - in spite of complaints of fatigue.   Once positioned in bed, he fell immediately asleep as he stated he could not sleep due to loud alarm on bed when he moved around in the bed  Call bell and phone within patient reach at end of session     Therapy Documentation Precautions:  Precautions Precautions: Fall Precaution Comments: L sided neglect, shoulder increased risk for subluxation; quick LOB L wihtout awareness Restrictions Weight Bearing Restrictions: No   Pain: Pain Assessment Pain Assessment: No/denies pain  See Function Navigator for Current Functional Status.   Therapy/Group: Individual Therapy  Herschell Dimes 08/28/2016, 9:20 PM

## 2016-08-28 NOTE — Progress Notes (Signed)
Physical Therapy Session Note  Patient Details  Name: Chad Combs MRN: [id number] Date of Birth: [birth year]  Today's Date: 08/28/2016 PT Individual Time: 800-854 PT Individual Time Calculation: 26mn      Short Term Goals: Week 1:  PT Short Term Goal 1 (Week 1): pt will roll R wiht mod assist PT Short Term Goal 2 (Week 1): pt will move supine> sit through R sidelying with mod assist PT Short Term Goal 3 (Week 1): pt will transfer with assistanc of 1 person consistently PT Short Term Goal 4 (Week 1): pt will propel appropriate ht w/c x 50' using hemi method with min assist PT Short Term Goal 5 (Week 1): pt will tolerate standing in static stander x 15 minutes.  Skilled Therapeutic Interventions/Progress Updates:   Pt received supine in bed and agreeable to PT. Supine>sit transfer with max assist to control the trunk and the LLE as well as moderate cues postural control and proper UE placement. Sitting balance with 1 UE supported and supervision assist from PT.   [facility] and max cues for anterior weight shift, improved head/hips relationship, and proper sequencing for improve success and safety.   WC mobility with mod assist from PT x 1246f  Additional WC mobility with min assist progressing to Supervision assist to aviod 5+8 cones on the L side. Cues for L hemi technique from PT as well as posture to allow improved foot contact. Pt able to avoid 11 of 13 cones on L side.  Toilet transfer with stedy and mod assist from PT. Pt was able to void bowel and bladder. PT required to perform perineal hygiene while pt performed standing tolerance x 3 minutes in stedy with min assist from PT and 1` UE supprt.   [facility] and [facility] sitting with call bell in reach and all needs met.         Therapy Documentation Precautions:  Precautions Precautions: Fall Precaution Comments: L sided neglect, shoulder increased risk for  subluxation; quick LOB L wihtout awareness Restrictions Weight Bearing Restrictions: No    Pain: 0/10    See Function Navigator for Current Functional Status.   Therapy/Group: Individual Therapy  AuLorie Phenix/03/2017, 2:12 PM

## 2016-08-28 NOTE — Therapy (Signed)
5732-2025 total individual time=75 minutes  Occupational Therapy  Patient participated in supine to/ from EOB transfer with mod assist.   He sat EOB with poor+ balance supporting self with Mod A trying to use urinal with no success.    He required max A to reposition in bed.  Son and son's friend arrived and son asked to hear OT goals and discussed that he prefers his dad stay inrehab at least 3 weeks to become as independent as possible before returning home because assist at home will be provided by [place] he and two others taking turns.  Patient was left with his supportive son and son's male friend at session's ene

## 2016-08-28 NOTE — Progress Notes (Signed)
RT came to do MDI- pt absent from room at this time. Pt unavailable

## 2016-08-28 NOTE — Progress Notes (Signed)
Subjective/Complaints:   Discussed with nursing at bedside. No new issues overnight.    ROS   Denies chest pain, shortness of breath, nausea, vomiting, diarrhea, constipation.  Objective: Vital Signs: Blood pressure 140/67, pulse 87, temperature 98 F (36.7 C), temperature source Oral, resp. rate 18, height 5' 9"  (1.753 m), weight 93.6 kg (206 lb 5.6 oz), SpO2 93 %. No results found. Results for orders placed or performed during the hospital encounter of 08/25/16 (from the past 72 hour(s))  Glucose, capillary     Status: Abnormal   Collection Time: 08/25/16  4:55 PM  Result Value Ref Range   Glucose-Capillary 251 (H) 65 - 99 mg/dL  Glucose, capillary     Status: Abnormal   Collection Time: 08/25/16  9:32 PM  Result Value Ref Range   Glucose-Capillary 213 (H) 65 - 99 mg/dL  CBC WITH DIFFERENTIAL     Status: Abnormal   Collection Time: 08/26/16  6:02 AM  Result Value Ref Range   WBC 12.0 (H) 4.0 - 10.5 K/uL   RBC 5.39 4.22 - 5.81 MIL/uL   Hemoglobin 18.1 (H) 13.0 - 17.0 g/dL   HCT 53.4 (H) 39.0 - 52.0 %   MCV 99.1 78.0 - 100.0 fL   MCH 33.6 26.0 - 34.0 pg   MCHC 33.9 30.0 - 36.0 g/dL   RDW 12.8 11.5 - 15.5 %   Platelets 166 150 - 400 K/uL   Neutrophils Relative % 64 %   Neutro Abs 7.6 1.7 - 7.7 K/uL   Lymphocytes Relative 27 %   Lymphs Abs 3.2 0.7 - 4.0 K/uL   Monocytes Relative 8 %   Monocytes Absolute 1.0 0.1 - 1.0 K/uL   Eosinophils Relative 1 %   Eosinophils Absolute 0.1 0.0 - 0.7 K/uL   Basophils Relative 0 %   Basophils Absolute 0.0 0.0 - 0.1 K/uL  Comprehensive metabolic panel     Status: Abnormal   Collection Time: 08/26/16  6:02 AM  Result Value Ref Range   Sodium 136 135 - 145 mmol/L   Potassium 3.8 3.5 - 5.1 mmol/L   Chloride 103 101 - 111 mmol/L   CO2 23 22 - 32 mmol/L   Glucose, Bld 212 (H) 65 - 99 mg/dL   BUN 19 6 - 20 mg/dL   Creatinine, Ser 1.20 0.61 - 1.24 mg/dL   Calcium 9.2 8.9 - 10.3 mg/dL   Total Protein 7.0 6.5 - 8.1 g/dL   Albumin 3.3  (L) 3.5 - 5.0 g/dL   AST 44 (H) 15 - 41 U/L   ALT 79 (H) 17 - 63 U/L   Alkaline Phosphatase 61 38 - 126 U/L   Total Bilirubin 1.4 (H) 0.3 - 1.2 mg/dL   GFR calc non Af Amer >60 >60 mL/min   GFR calc Af Amer >60 >60 mL/min    Comment: (NOTE) The eGFR has been calculated using the CKD EPI equation. This calculation has not been validated in all clinical situations. eGFR's persistently <60 mL/min signify possible Chronic Kidney Disease.    Anion gap 10 5 - 15  Glucose, capillary     Status: Abnormal   Collection Time: 08/26/16  6:52 AM  Result Value Ref Range   Glucose-Capillary 215 (H) 65 - 99 mg/dL  Glucose, capillary     Status: Abnormal   Collection Time: 08/26/16 11:51 AM  Result Value Ref Range   Glucose-Capillary 248 (H) 65 - 99 mg/dL  Glucose, capillary     Status: Abnormal   Collection  Time: 08/26/16  4:30 PM  Result Value Ref Range   Glucose-Capillary 188 (H) 65 - 99 mg/dL  Glucose, capillary     Status: Abnormal   Collection Time: 08/26/16  9:00 PM  Result Value Ref Range   Glucose-Capillary 237 (H) 65 - 99 mg/dL   Comment 1 Notify RN   Glucose, capillary     Status: Abnormal   Collection Time: 08/27/16  6:46 AM  Result Value Ref Range   Glucose-Capillary 202 (H) 65 - 99 mg/dL  Glucose, capillary     Status: Abnormal   Collection Time: 08/27/16 11:49 AM  Result Value Ref Range   Glucose-Capillary 269 (H) 65 - 99 mg/dL   Comment 1 Notify RN   Glucose, capillary     Status: Abnormal   Collection Time: 08/27/16  4:23 PM  Result Value Ref Range   Glucose-Capillary 214 (H) 65 - 99 mg/dL  Glucose, capillary     Status: Abnormal   Collection Time: 08/27/16  9:33 PM  Result Value Ref Range   Glucose-Capillary 190 (H) 65 - 99 mg/dL  Glucose, capillary     Status: Abnormal   Collection Time: 08/28/16  6:36 AM  Result Value Ref Range   Glucose-Capillary 181 (H) 65 - 99 mg/dL     HEENT: normal Cardio: RRR and No murmur Resp: CTA B/L and Unlabored GI: BS positive  and Nontender, nondistended Extremity:  No Edema Skin:   Intact Neuro: Lethargic, Flat, Abnormal Sensory Reduced sensation to pinch in left upper extremity greater than left lower extremity. Intact sensation on the right, Abnormal Motor Motor is 5/5 in the right deltoid, biceps, triceps, grip, hip flexor, knee extensor, ankle dorsiflexor, trace left biceps and finger flexors, 2 minus, left hip, knee extensor synergy, 0 at the ankle, Abnormal FMC Ataxic/ dec FMC and Dysarthric Musc/Skel:  Other No pain with upper extremity or lower extremity range of motion Gen. no acute distress   Assessment/Plan: 1. Functional deficits secondary to Right MCA infarct with Left Hemiparesis which require 3+ hours per day of interdisciplinary therapy in a comprehensive inpatient rehab setting. Physiatrist is providing close team supervision and 24 hour management of active medical problems listed below. Physiatrist and rehab team continue to assess barriers to discharge/monitor patient progress toward functional and medical goals. FIM: Function - Bathing Position: Sitting EOB Body parts bathed by helper: Right arm, Left arm, Chest, Abdomen, Front perineal area, Buttocks, Right upper leg, Left upper leg, Right lower leg, Left lower leg, Back  Function- Upper Body Dressing/Undressing What is the patient wearing?: Hospital gown Function - Lower Body Dressing/Undressing What is the patient wearing?: Pants Pants- Performed by helper: Thread/unthread right pants leg, Thread/unthread left pants leg, Pull pants up/down Non-skid slipper socks- Performed by helper: Don/doff right sock, Don/doff left sock Assist for lower body dressing:  (dependent)  Function - Toileting Toileting steps completed by helper: Performs perineal hygiene, Adjust clothing after toileting (Pt on bedpan at start of session)     Function - Chair/bed transfer Chair/bed transfer method: Squat pivot Chair/bed transfer assist level: Total  assist (Pt < 25%) Chair/bed transfer assistive device: Armrests Mechanical lift: Stedy Chair/bed transfer details: Manual facilitation for weight shifting, Verbal cues for technique, Manual facilitation for placement, Manual facilitation for weight bearing  Function - Locomotion: Wheelchair Will patient use wheelchair at discharge?: Yes Type: Manual Max wheelchair distance: 230f  Assist Level: Moderate assistance (Pt 50 - 74%) Assist Level: Moderate assistance (Pt 50 - 74%) Assist Level: Moderate assistance (  Pt 50 - 74%) Turns around,maneuvers to table,bed, and toilet,negotiates 3% grade,maneuvers on rugs and over doorsills: No Function - Locomotion: Ambulation Ambulation activity did not occur: Safety/medical concerns (pt pushes toward hemi side; unsafe)  Function - Comprehension Comprehension: Auditory Comprehension assist level: Understands basic 75 - 89% of the time/ requires cueing 10 - 24% of the time  Function - Expression Expression: Verbal Expression assist level: Expresses basic 75 - 89% of the time/requires cueing 10 - 24% of the time. Needs helper to occlude trach/needs to repeat words.  Function - Social Interaction Social Interaction assist level: Interacts appropriately 75 - 89% of the time - Needs redirection for appropriate language or to initiate interaction.  Function - Problem Solving Problem solving assist level: Solves basic 75 - 89% of the time/requires cueing 10 - 24% of the time  Function - Memory Memory assist level: Recognizes or recalls 75 - 89% of the time/requires cueing 10 - 24% of the time Patient normally able to recall (first 3 days only): That he or she is in a hospital, Current season, Staff names and faces  Medical Problem List and Plan: 1.  Left hemiplegia, limitations with self-care, difficulty with transfers secondary to right MCA CVA. Cont CIR PT, OT, speech. No significant motor recovery thus far 2.  DVT Prophylaxis/Anticoagulation:  Pharmaceutical: Lovenox 3. Pain Management: N/A 4. Mood: Team to provide ego support. Expressing anxiety about current situation. Has supportive family. LCSW to follow for evaluation and support.  5. Neuropsych: This patient is capable of making decisions on his own behalf. 6. Skin/Wound Care: routine pressure relief measures.  7. Fluids/Electrolytes/Nutrition: Monitor I/O. Check lytes in am. Encourage fluid intake.  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 9. T2DM: Hgb A1C- 10.5 --. Continue metformin bid. Was started on lantus adjust per am CBG, crease to 25 units daily at bedtime, with SSI for elevated BS--will consult RD for diabetic education and start education on insulin administration.  Will monitor BS ac/hs CBG (last 3)   Recent Labs  08/27/16 1623 08/27/16 2133 08/28/16 0636  GLUCAP 214* 190* 181*    10. HTN: Monitor BP bid--on lisinopril daily.  Vitals:   08/27/16 1501 08/28/16 0527  BP: 115/75 140/67  Pulse: 99 87  Resp: 18 18  Temp: 98.6 F (37 C) 98 F (36.7 C)   11. Leucocytosis: Monitor for signs of infection. UA negative. Afebrile 12. Polycythemia: Smokes 1.5 PPD. Expect this to remain elevated during hospitalization 13. COPD: SOB reported--CXR negative. Encourage tobacco cessation.  Managed without medications at this time.   14. Tobacco abuse: Counsel  14. Hypoalbuminemia, start pro-stat LOS (Days) 3 A FACE TO FACE EVALUATION WAS PERFORMED  KIRSTEINS,ANDREW E 08/28/2016, 11:06 AM

## 2016-08-29 LAB — GLUCOSE, CAPILLARY
GLUCOSE-CAPILLARY: 199 mg/dL — AB (ref 65–99)
GLUCOSE-CAPILLARY: 172 mg/dL — AB (ref 65–99)
Glucose-Capillary: 149 mg/dL — ABNORMAL HIGH (ref 65–99)
Glucose-Capillary: 147 mg/dL — ABNORMAL HIGH (ref 65–99)

## 2016-08-29 MED ORDER — INSULIN GLARGINE 100 UNIT/ML ~~LOC~~ SOLN
30.0000 [IU] | Freq: Every day | SUBCUTANEOUS | Status: DC
  Administered 2016-08-30: 30 [IU] via SUBCUTANEOUS
  Filled 2016-08-29: qty 0.3

## 2016-08-29 NOTE — Progress Notes (Signed)
Subjective/Complaints:  Feels tired, but no other complaints    ROS   Denies chest pain, shortness of breath, nausea, vomiting, diarrhea, constipation.  Objective: Vital Signs: Blood pressure 128/74, pulse 80, temperature 97.7 F (36.5 C), temperature source Oral, resp. rate 16, height 5\' 9"  (1.753 m), weight 91.6 kg (201 lb 15.1 oz), SpO2 95 %. No results found. Results for orders placed or performed during the hospital encounter of 08/25/16 (from the past 72 hour(s))  Glucose, capillary     Status: Abnormal   Collection Time: 08/26/16 11:51 AM  Result Value Ref Range   Glucose-Capillary 248 (H) 65 - 99 mg/dL  Glucose, capillary     Status: Abnormal   Collection Time: 08/26/16  4:30 PM  Result Value Ref Range   Glucose-Capillary 188 (H) 65 - 99 mg/dL  Glucose, capillary     Status: Abnormal   Collection Time: 08/26/16  9:00 PM  Result Value Ref Range   Glucose-Capillary 237 (H) 65 - 99 mg/dL   Comment 1 Notify RN   Glucose, capillary     Status: Abnormal   Collection Time: 08/27/16  6:46 AM  Result Value Ref Range   Glucose-Capillary 202 (H) 65 - 99 mg/dL  Glucose, capillary     Status: Abnormal   Collection Time: 08/27/16 11:49 AM  Result Value Ref Range   Glucose-Capillary 269 (H) 65 - 99 mg/dL   Comment 1 Notify RN   Glucose, capillary     Status: Abnormal   Collection Time: 08/27/16  4:23 PM  Result Value Ref Range   Glucose-Capillary 214 (H) 65 - 99 mg/dL  Glucose, capillary     Status: Abnormal   Collection Time: 08/27/16  9:33 PM  Result Value Ref Range   Glucose-Capillary 190 (H) 65 - 99 mg/dL  Glucose, capillary     Status: Abnormal   Collection Time: 08/28/16  6:36 AM  Result Value Ref Range   Glucose-Capillary 181 (H) 65 - 99 mg/dL  Glucose, capillary     Status: Abnormal   Collection Time: 08/28/16 12:24 PM  Result Value Ref Range   Glucose-Capillary 167 (H) 65 - 99 mg/dL  Glucose, capillary     Status: Abnormal   Collection Time: 08/28/16  5:21  PM  Result Value Ref Range   Glucose-Capillary 259 (H) 65 - 99 mg/dL  Glucose, capillary     Status: Abnormal   Collection Time: 08/28/16  9:52 PM  Result Value Ref Range   Glucose-Capillary 163 (H) 65 - 99 mg/dL  Glucose, capillary     Status: Abnormal   Collection Time: 08/29/16  6:56 AM  Result Value Ref Range   Glucose-Capillary 199 (H) 65 - 99 mg/dL   Comment 1 Notify RN      HEENT: normal Cardio: RRR and No murmur Resp: CTA B/L and Unlabored GI: BS positive and Nontender, nondistended Extremity:  No Edema Skin:   Intact Neuro: Lethargic, Flat, Abnormal Sensory Reduced sensation to pinch in left upper extremity greater than left lower extremity. Intact sensation on the right, Abnormal Motor Motor is 5/5 in the right deltoid, biceps, triceps, grip, hip flexor, knee extensor, ankle dorsiflexor, trace left biceps and finger flexors, 2 minus, left hip, knee extensor synergy, 0 at the ankle, Abnormal FMC Ataxic/ dec FMC and Dysarthric Musc/Skel:  Other No pain with upper extremity or lower extremity range of motion Gen. no acute distress   Assessment/Plan: 1. Functional deficits secondary to Right MCA infarct with Left Hemiparesis which require 3+  hours per day of interdisciplinary therapy in a comprehensive inpatient rehab setting. Physiatrist is providing close team supervision and 24 hour management of active medical problems listed below. Physiatrist and rehab team continue to assess barriers to discharge/monitor patient progress toward functional and medical goals. FIM: Function - Bathing Position: Sitting EOB Body parts bathed by helper: Right arm, Left arm, Chest, Abdomen, Front perineal area, Buttocks, Right upper leg, Left upper leg, Right lower leg, Left lower leg, Back  Function- Upper Body Dressing/Undressing What is the patient wearing?: Hospital gown Function - Lower Body Dressing/Undressing What is the patient wearing?: Pants Pants- Performed by helper:  Thread/unthread right pants leg, Thread/unthread left pants leg, Pull pants up/down Non-skid slipper socks- Performed by helper: Don/doff right sock, Don/doff left sock Assist for lower body dressing:  (dependent)  Function - Toileting Toileting steps completed by helper: Performs perineal hygiene, Adjust clothing after toileting (Pt on bedpan at start of session)     Function - Chair/bed transfer Chair/bed transfer method: Lateral scoot Chair/bed transfer assist level: Maximal assist (Pt 25 - 49%/lift and lower) Chair/bed transfer assistive device: Sliding board, Armrests Mechanical lift: Stedy Chair/bed transfer details: Manual facilitation for weight shifting, Verbal cues for technique, Manual facilitation for placement, Manual facilitation for weight bearing  Function - Locomotion: Wheelchair Will patient use wheelchair at discharge?: Yes Type: Manual Max wheelchair distance: 185ft  Assist Level: Touching or steadying assistance (Pt > 75%) Assist Level: Touching or steadying assistance (Pt > 75%) Assist Level: Moderate assistance (Pt 50 - 74%) Turns around,maneuvers to table,bed, and toilet,negotiates 3% grade,maneuvers on rugs and over doorsills: No Function - Locomotion: Ambulation Ambulation activity did not occur: Safety/medical concerns (pt pushes toward hemi side; unsafe)  Function - Comprehension Comprehension: Auditory Comprehension assist level: Understands basic 75 - 89% of the time/ requires cueing 10 - 24% of the time  Function - Expression Expression: Verbal Expression assist level: Expresses basic 75 - 89% of the time/requires cueing 10 - 24% of the time. Needs helper to occlude trach/needs to repeat words.  Function - Social Interaction Social Interaction assist level: Interacts appropriately 75 - 89% of the time - Needs redirection for appropriate language or to initiate interaction.  Function - Problem Solving Problem solving assist level: Solves basic 75 -  89% of the time/requires cueing 10 - 24% of the time  Function - Memory Memory assist level: Recognizes or recalls 75 - 89% of the time/requires cueing 10 - 24% of the time Patient normally able to recall (first 3 days only): That he or she is in a hospital, Current season, Staff names and faces  Medical Problem List and Plan: 1.  Left hemiplegia, limitations with self-care, difficulty with transfers secondary to right MCA CVA. Cont CIR PT, OT, speech. No significant motor recovery thus far 2.  DVT Prophylaxis/Anticoagulation: Pharmaceutical: Lovenox 3. Pain Management: N/A 4. Mood: Team to provide ego support. Expressing anxiety about current situation. Has supportive family. LCSW to follow for evaluation and support.  5. Neuropsych: This patient is capable of making decisions on his own behalf. 6. Skin/Wound Care: routine pressure relief measures.  7. Fluids/Electrolytes/Nutrition: Monitor I/O. Check lytes in am. Encourage fluid intake.  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 9. T2DM: Hgb A1C- 10.5 --. Continue metformin 1000mg  bid. Was started on lantus adjust per am CBG, crease to 30 units daily at bedtime, with SSI for elevated BS--will consult RD for diabetic education and start education on insulin administration.  Will monitor BS  ac/hs CBG (last 3)   Recent Labs  08/28/16 1721 08/28/16 2152 08/29/16 0656  GLUCAP 259* 163* 199*    10. HTN: Monitor BP bid--on lisinopril 20mg  daily.  Vitals:   08/28/16 1437 08/29/16 0547  BP: 131/71 128/74  Pulse: 97 80  Resp: 18 16  Temp: 97.7 F (36.5 C) 97.7 F (36.5 C)   11. Leucocytosis: Monitor for signs of infection. UA negative. Afebrile 12. Polycythemia: Smokes 1.5 PPD. Expect this to remain elevated during hospitalization, On NicoDerm patch 13. COPD: SOB reported--CXR negative. Encourage tobacco cessation.  Managed without medications at this time.   14. Tobacco abuse: Counsel  14. Hypoalbuminemia, start  pro-stat LOS (Days) 4 A FACE TO FACE EVALUATION WAS PERFORMED  KIRSTEINS,ANDREW E 08/29/2016, 10:25 AM

## 2016-08-30 ENCOUNTER — Inpatient Hospital Stay (HOSPITAL_COMMUNITY): Payer: Self-pay | Admitting: Speech Pathology

## 2016-08-30 ENCOUNTER — Inpatient Hospital Stay (HOSPITAL_COMMUNITY): Payer: Self-pay | Admitting: *Deleted

## 2016-08-30 ENCOUNTER — Inpatient Hospital Stay (HOSPITAL_COMMUNITY): Payer: Self-pay | Admitting: Occupational Therapy

## 2016-08-30 DIAGNOSIS — R339 Retention of urine, unspecified: Secondary | ICD-10-CM

## 2016-08-30 LAB — GLUCOSE, CAPILLARY
Glucose-Capillary: 162 mg/dL — ABNORMAL HIGH (ref 65–99)
Glucose-Capillary: 198 mg/dL — ABNORMAL HIGH (ref 65–99)
Glucose-Capillary: 121 mg/dL — ABNORMAL HIGH (ref 65–99)
Glucose-Capillary: 156 mg/dL — ABNORMAL HIGH (ref 65–99)

## 2016-08-30 MED ORDER — INSULIN GLARGINE 100 UNIT/ML ~~LOC~~ SOLN
35.0000 [IU] | Freq: Every day | SUBCUTANEOUS | Status: DC
  Administered 2016-08-31: 35 [IU] via SUBCUTANEOUS
  Filled 2016-08-30: qty 0.35

## 2016-08-30 MED ORDER — TAMSULOSIN HCL 0.4 MG PO CAPS
0.4000 mg | ORAL_CAPSULE | Freq: Every day | ORAL | Status: DC
  Administered 2016-08-30 – 2016-09-30 (×32): 0.4 mg via ORAL
  Filled 2016-08-30 (×32): qty 1

## 2016-08-30 NOTE — Progress Notes (Signed)
Speech Language Pathology Daily Session Note  Patient Details  Name: Chad Combs MRN: [id number] Date of Birth: 1956/01/22  Today's Date: 08/30/2016 SLP Individual Time: 1030-1130 SLP Individual Time Calculation (min): 60 min  Short Term Goals: Week 1: SLP Short Term Goal 1 (Week 1): Given Mod A verbal cues, pt will utilize external memory aids to recall new daily information.  SLP Short Term Goal 2 (Week 1): Pt will utilize compensatory intelligibility strategies with Mod A verbal cues to achieve 75% intelligibility at the sentence level.  SLP Short Term Goal 3 (Week 1): Pt will demonstrate sustained attention in mildly distracting environment for ~30 minutes with Mod A verbal cues.  SLP Short Term Goal 4 (Week 1): Pt will complete basic, familiar problem solving tasks with Mod A verbal cues.  SLP Short Term Goal 5 (Week 1): Pt will scan to left of his environment with Mod A verbal cues.  SLP Short Term Goal 6 (Week 1): Pt will consume current diet without overt s/s of aspiration with Mod A verbal cues.   Skilled Therapeutic Interventions: Skilled treatment session focused on cognition goals. SLP facilitated session by providing Max A to total A for completion of simple peg placement task.  Pt unable to follow basic 1 step direction for peg placement. This might be possibly related to left side inattention and decreased problem solving abilities. Pt required Mod A verbal cues for sustained attention in mildly distracting environment for ~30 minutes. Pt was returned to room, left upright in wheelchair, safety belt in place and all needs within reach. Continue per current plan of care.      Function:    Cognition Comprehension Comprehension assist level: Understands basic 75 - 89% of the time/ requires cueing 10 - 24% of the time  Expression   Expression assist level: Expresses basic 75 - 89% of the time/requires cueing 10 - 24% of the time. Needs helper to occlude trach/needs to repeat  words.  Social Interaction Social Interaction assist level: Interacts appropriately 75 - 89% of the time - Needs redirection for appropriate language or to initiate interaction.  Problem Solving Problem solving assist level: Solves basic 50 - 74% of the time/requires cueing 25 - 49% of the time  Memory Memory assist level: Recognizes or recalls 75 - 89% of the time/requires cueing 10 - 24% of the time    Pain Pain Assessment Pain Assessment: No/denies pain  Therapy/Group: Individual Therapy   Kara Mierzejewski B. [name], M.S., CCC-SLP Speech-Language Pathologist   Donnica Jarnagin 08/30/2016, 12:31 PM

## 2016-08-30 NOTE — Progress Notes (Signed)
Occupational Therapy Session Note  Patient Details  Name: Chad Combs MRN: [id number] Date of Birth: 1956-05-19  Today's Date: 08/30/2016 OT Individual Time: 1300-1416 OT Individual Time Calculation (min): 76 min    Short Term Goals: Week 1:  OT Short Term Goal 1 (Week 1): Pt will maintain static sitting balance for at least 5 mins with supervision in preparation for selfcare tasks.  OT Short Term Goal 2 (Week 1): Pt will complete UB bathing with min assist in unsupported sitting.  OT Short Term Goal 3 (Week 1): Pt will complete LB bathing with max assist sit to stand.  OT Short Term Goal 4 (Week 1): Pt will donn pullover shirt in supported sitting with min assist 2 consecutive sessions.  OT Short Term Goal 5 (Week 1): Pt will donn pullup pants with max assist sit to stand for 2 consecutive sessions.   Skilled Therapeutic Interventions/Progress Updates:    Pt completed transfer squat pivot to the wheelchair and then max assist stand pivot to the tub bench.  Decreased ability to support weightbearing through the LLE or advance it for stepping.  Max facilitation to complete both of these aspects.  He was able to sequence bathing with min assist overall.  Max hand over hand to integrate the LUE as a stabilizer for holding the washcloth as well as an active assist for washing the right arm.  He was able to stand with max assist as well while washing peri area, with therapist assisting with rinsing it off.  Dressing completed sit to stand at the sink.  Mod demonstrational cueing for integration of hemi dressing techniques.  He was able to cross the LLE over the right knee and maintain with min assist when attempting to thread undergarment protector and shorts, but needed max instructional cueing for placing his LLE in the correct leg hole as he did this unsuccessfully X 2 .  Setup and mod demonstrational cueing for donning pullover shirt as well.  He was able to use one handed technique for donning both  gripper socks with mod assist to maintain the LLE crossed over the right knee and supervision with min instructional cueing to donn the left.  Finished session with transfer to the bed with max assist squat pivot to the left.  Pt left with bed alarm in place, and call button and phone in reach.  LUE positioned on pillows as well.    Therapy Documentation Precautions:  Precautions Precautions: Fall Precaution Comments: L sided neglect, shoulder increased risk for subluxation; quick LOB L wihtout awareness Restrictions Weight Bearing Restrictions: No  Pain: Pain Assessment Pain Assessment: No/denies pain ADL: See Function Navigator for Current Functional Status.   Therapy/Group: Individual Therapy  Zohar Maroney OTR/L 08/30/2016, 3:47 PM

## 2016-08-30 NOTE — Progress Notes (Signed)
Physical Therapy Session Note  Patient Details  Name: Chad Combs MRN: [id number] Date of Birth: 30-Apr-1956  Today's Date: 08/30/2016 PT Individual Time: 0900-1000 PT Individual Time Calculation (min): 60 min   Short Term Goals: Week 1:  PT Short Term Goal 1 (Week 1): pt will roll R wiht mod assist PT Short Term Goal 2 (Week 1): pt will move supine> sit through R sidelying with mod assist PT Short Term Goal 3 (Week 1): pt will transfer with assistanc of 1 person consistently PT Short Term Goal 4 (Week 1): pt will propel appropriate ht w/c x 50' using hemi method with min assist PT Short Term Goal 5 (Week 1): pt will tolerate standing in static stander x 15 minutes.  Skilled Therapeutic Interventions/Progress Updates:    Tx focused on functional mobility training, gait, and NMR via forced use, manual facilitation, and multi-modal cues in supine, sitting, and standing positions. Pt received in bed with min c/o back "sore" from lying supine. Educated pt on sidelying benefits and strrategies. Pt abel to recall many therapies and exercises from prior days.   Supine NMR including PNF diagonals for LLE x10, SAQ with AAROM x10, lower trunk rotations x10, bridging with 5 sec holds x 10. No quad contraction noted, but good hip/knee ext present.   Rolling R/L with multi-modal cues for UE management with up to mod A for R roll.   Sidelying>Sit with Mod A for RLE and trunk lift. Sequence cues and manual facilitation.   Sliding board transfer to R with Max A and sequence cues, manual facilitation for anterior translation over BOS and head-hips relationship.   WC priopulsion x120' with Min A overall for steering  Standing frame x13 min with NMR tasks for L weight shfitng and midline orietntation. L gaze and cognitive tasks for hangman game. VSS throughout and good correction with cues.  Gait in hall with +2 or WC follow, mirror for visual feedback x8' with max A for LLE swing phase and stance  stabilization. Manual facilitation for trunk rotation and upright posture. Pt able to follow step-by-step commands appropriately.  Pt left up in [facility] with a lap belt and all needs in reach.    Therapy Documentation Precautions:  Precautions Precautions: Fall Precaution Comments: L sided neglect, shoulder increased risk for subluxation; quick LOB L wihtout awareness Restrictions Weight Bearing Restrictions: No  pain: none  See Function Navigator for Current Functional Status.   Therapy/Group: Individual Therapy  Laronda Lisby, Corinna Lines, PT, DPT  08/30/2016, 10:05 AM

## 2016-08-30 NOTE — Progress Notes (Signed)
Subjective/Complaints:  Patient comfortable in bed. Has required intermittent catheterization on 2 occasions yesterday. He denies any history of prostate issues. However, even prior to his stroke, he complained of frequent urination at night. No pain with urination.   ROS   Denies chest pain, shortness of breath, nausea, vomiting, diarrhea, constipation.  Objective: Vital Signs: Blood pressure 130/75, pulse 78, temperature 98.6 F (37 C), temperature source Oral, resp. rate 18, height 5\' 9"  (1.753 m), weight 92 kg (202 lb 13.2 oz), SpO2 94 %. No results found. Results for orders placed or performed during the hospital encounter of 08/25/16 (from the past 72 hour(s))  Glucose, capillary     Status: Abnormal   Collection Time: 08/27/16  6:46 AM  Result Value Ref Range   Glucose-Capillary 202 (H) 65 - 99 mg/dL  Glucose, capillary     Status: Abnormal   Collection Time: 08/27/16 11:49 AM  Result Value Ref Range   Glucose-Capillary 269 (H) 65 - 99 mg/dL   Comment 1 Notify RN   Glucose, capillary     Status: Abnormal   Collection Time: 08/27/16  4:23 PM  Result Value Ref Range   Glucose-Capillary 214 (H) 65 - 99 mg/dL  Glucose, capillary     Status: Abnormal   Collection Time: 08/27/16  9:33 PM  Result Value Ref Range   Glucose-Capillary 190 (H) 65 - 99 mg/dL  Glucose, capillary     Status: Abnormal   Collection Time: 08/28/16  6:36 AM  Result Value Ref Range   Glucose-Capillary 181 (H) 65 - 99 mg/dL  Glucose, capillary     Status: Abnormal   Collection Time: 08/28/16 12:24 PM  Result Value Ref Range   Glucose-Capillary 167 (H) 65 - 99 mg/dL  Glucose, capillary     Status: Abnormal   Collection Time: 08/28/16  5:21 PM  Result Value Ref Range   Glucose-Capillary 259 (H) 65 - 99 mg/dL  Glucose, capillary     Status: Abnormal   Collection Time: 08/28/16  9:52 PM  Result Value Ref Range   Glucose-Capillary 163 (H) 65 - 99 mg/dL  Glucose, capillary     Status: Abnormal    Collection Time: 08/29/16  6:56 AM  Result Value Ref Range   Glucose-Capillary 199 (H) 65 - 99 mg/dL   Comment 1 Notify RN   Glucose, capillary     Status: Abnormal   Collection Time: 08/29/16 11:55 AM  Result Value Ref Range   Glucose-Capillary 149 (H) 65 - 99 mg/dL  Glucose, capillary     Status: Abnormal   Collection Time: 08/29/16  4:56 PM  Result Value Ref Range   Glucose-Capillary 147 (H) 65 - 99 mg/dL  Glucose, capillary     Status: Abnormal   Collection Time: 08/29/16  8:43 PM  Result Value Ref Range   Glucose-Capillary 172 (H) 65 - 99 mg/dL   Comment 1 Notify RN      HEENT: normal Cardio: RRR and No murmur Resp: CTA B/L and Unlabored GI: BS positive and Nontender, nondistended Extremity:  No Edema Skin:   Intact Neuro: Lethargic, Flat, Abnormal Sensory Reduced sensation to pinch in left upper extremity greater than left lower extremity. Intact sensation on the right, Abnormal Motor Motor is 5/5 in the right deltoid, biceps, triceps, grip, hip flexor, knee extensor, ankle dorsiflexor, trace left biceps and finger flexors, 2 minus, left hip, knee extensor synergy, 0 at the ankle, Abnormal FMC Ataxic/ dec FMC and Dysarthric Musc/Skel:  Other No pain with  upper extremity or lower extremity range of motion Gen. no acute distress   Assessment/Plan: 1. Functional deficits secondary to Right MCA infarct with Left Hemiparesis which require 3+ hours per day of interdisciplinary therapy in a comprehensive inpatient rehab setting. Physiatrist is providing close team supervision and 24 hour management of active medical problems listed below. Physiatrist and rehab team continue to assess barriers to discharge/monitor patient progress toward functional and medical goals. FIM: Function - Bathing Position: Sitting EOB Body parts bathed by helper: Right arm, Left arm, Chest, Abdomen, Front perineal area, Buttocks, Right upper leg, Left upper leg, Right lower leg, Left lower leg,  Back  Function- Upper Body Dressing/Undressing What is the patient wearing?: Hospital gown Function - Lower Body Dressing/Undressing What is the patient wearing?: Pants Pants- Performed by helper: Thread/unthread right pants leg, Thread/unthread left pants leg, Pull pants up/down Non-skid slipper socks- Performed by helper: Don/doff right sock, Don/doff left sock Assist for lower body dressing:  (dependent)  Function - Toileting Toileting steps completed by helper: Performs perineal hygiene, Adjust clothing after toileting (Pt on bedpan at start of session)     Function - Chair/bed transfer Chair/bed transfer method: Lateral scoot Chair/bed transfer assist level: Maximal assist (Pt 25 - 49%/lift and lower) Chair/bed transfer assistive device: Sliding board, Armrests Mechanical lift: Stedy Chair/bed transfer details: Manual facilitation for weight shifting, Verbal cues for technique, Manual facilitation for placement, Manual facilitation for weight bearing  Function - Locomotion: Wheelchair Will patient use wheelchair at discharge?: Yes Type: Manual Max wheelchair distance: 124ft  Assist Level: Touching or steadying assistance (Pt > 75%) Assist Level: Touching or steadying assistance (Pt > 75%) Assist Level: Moderate assistance (Pt 50 - 74%) Turns around,maneuvers to table,bed, and toilet,negotiates 3% grade,maneuvers on rugs and over doorsills: No Function - Locomotion: Ambulation Ambulation activity did not occur: Safety/medical concerns (pt pushes toward hemi side; unsafe)  Function - Comprehension Comprehension: Auditory Comprehension assist level: Understands basic 75 - 89% of the time/ requires cueing 10 - 24% of the time  Function - Expression Expression: Verbal Expression assist level: Expresses basic 75 - 89% of the time/requires cueing 10 - 24% of the time. Needs helper to occlude trach/needs to repeat words.  Function - Social Interaction Social Interaction assist  level: Interacts appropriately 75 - 89% of the time - Needs redirection for appropriate language or to initiate interaction.  Function - Problem Solving Problem solving assist level: Solves basic 75 - 89% of the time/requires cueing 10 - 24% of the time  Function - Memory Memory assist level: Recognizes or recalls 75 - 89% of the time/requires cueing 10 - 24% of the time Patient normally able to recall (first 3 days only): That he or she is in a hospital, Current season, Staff names and faces  Medical Problem List and Plan: 1.  Left hemiplegia, limitations with self-care, difficulty with transfers secondary to right MCA CVA. Cont CIR PT, OT, speech. No significant motor recovery thus far 2.  DVT Prophylaxis/Anticoagulation: Pharmaceutical: Lovenox 3. Pain Management: N/A 4. Mood: Team to provide ego support. Expressing anxiety about current situation. Has supportive family. LCSW to follow for evaluation and support.  5. Neuropsych: This patient is capable of making decisions on his own behalf. 6. Skin/Wound Care: routine pressure relief measures.  7. Fluids/Electrolytes/Nutrition: Monitor I/O. Check lytes in am. Encourage fluid intake.  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 9. T2DM: Hgb A1C- 10.5 --. Continue metformin  1000mg  bid. Was started on lantus adjust per am CBG,increase to 35 units daily at bedtime, with SSI for elevated BS--will consult RD for diabetic education and start education on insulin administration.  Will monitor BS ac/hs CBG (last 3)   Recent Labs  08/29/16 1155 08/29/16 1656 08/29/16 2043  GLUCAP 149* 147* 172*    10. HTN: Monitor BP bid--on lisinopril 20mg  daily.  Vitals:   08/29/16 1600 08/30/16 0500  BP: 130/72 130/75  Pulse: 81 78  Resp: 16 18  Temp: 97.4 F (36.3 C) 98.6 F (37 C)   11. Leucocytosis: Monitor for signs of infection. UA negative. Afebrile 12. Polycythemia: Smokes 1.5 PPD.  Expect this to remain elevated during hospitalization, On NicoDerm patch 13. COPD: SOB reported--CXR negative. Encourage tobacco cessation.  Managed without medications at this time.   14. Tobacco abuse: Counsel  14. Hypoalbuminemia, start pro-stat 15. Urinary retention, suspect BPH, trial of Flomax, monitor blood pressure LOS (Days) 5 A FACE TO FACE EVALUATION WAS PERFORMED  Kyeshia Zinn E 08/30/2016, 6:16 AM

## 2016-08-31 ENCOUNTER — Inpatient Hospital Stay (HOSPITAL_COMMUNITY): Payer: Self-pay | Admitting: Occupational Therapy

## 2016-08-31 ENCOUNTER — Inpatient Hospital Stay (HOSPITAL_COMMUNITY): Payer: Self-pay | Admitting: Speech Pathology

## 2016-08-31 ENCOUNTER — Inpatient Hospital Stay (HOSPITAL_COMMUNITY): Payer: Self-pay | Admitting: Physical Therapy

## 2016-08-31 LAB — GLUCOSE, CAPILLARY
GLUCOSE-CAPILLARY: 224 mg/dL — AB (ref 65–99)
GLUCOSE-CAPILLARY: 136 mg/dL — AB (ref 65–99)
Glucose-Capillary: 178 mg/dL — ABNORMAL HIGH (ref 65–99)

## 2016-08-31 MED ORDER — INSULIN GLARGINE 100 UNIT/ML ~~LOC~~ SOLN
40.0000 [IU] | Freq: Every day | SUBCUTANEOUS | Status: DC
  Administered 2016-09-01 – 2016-09-02 (×2): 40 [IU] via SUBCUTANEOUS
  Filled 2016-08-31 (×3): qty 0.4

## 2016-08-31 MED ORDER — BETHANECHOL CHLORIDE 25 MG PO TABS
25.0000 mg | ORAL_TABLET | Freq: Three times a day (TID) | ORAL | Status: DC
Start: 2016-08-31 — End: 2016-09-09
  Administered 2016-08-31 – 2016-09-08 (×26): 25 mg via ORAL
  Filled 2016-08-31 (×27): qty 1

## 2016-08-31 NOTE — Progress Notes (Signed)
Physical Therapy Note  Patient Details  Name: SARTHAK RUBENSTEIN MRN: [id number] Date of Birth: 1955/11/03 Today's Date: 08/31/2016    Time: 1300-1415 75 minutes  1:1 No c/o pain. Pt performed bed mobility with bed rails with mod A.  Mod A transfers to w/c, mat during session.  Gait at railing in hallway with max facilitation for wt shifts and for core and balance control due to LOB A/P during gait.  Total A to advance Lt LE. Pt able to gait 15', 10'.  Standing balance training with mod/max A with reaching tasks with focus on Lt LE wt bearing and activation of Lt LE.  Seated and standing balance with ball tap game with min/mod A for dynamic sitting balance due to A/P LOB, max A for dynamic standing balance. Able to palpate Lt glute/HS activation with standing.  Observed pt finishing lunch with multiple episodes of coughing but able to clear throat of food.  Cues for small bites and chewing before swallow.   Shaquil Aldana 08/31/2016, 2:08 PM

## 2016-08-31 NOTE — Progress Notes (Signed)
Subjective/Complaints:   Seen with SLP, pt more alert in chair, takes meds in yogurt  ROS   Denies chest pain, shortness of breath, nausea, vomiting, diarrhea, constipation.  Objective: Vital Signs: Blood pressure (!) 143/70, pulse 84, temperature 98.7 F (37.1 C), temperature source Oral, resp. rate 18, height 5\' 9"  (1.753 m), weight 92 kg (202 lb 13.2 oz), SpO2 95 %. No results found. Results for orders placed or performed during the hospital encounter of 08/25/16 (from the past 72 hour(s))  Glucose, capillary     Status: Abnormal   Collection Time: 08/28/16  5:21 PM  Result Value Ref Range   Glucose-Capillary 259 (H) 65 - 99 mg/dL  Glucose, capillary     Status: Abnormal   Collection Time: 08/28/16  9:52 PM  Result Value Ref Range   Glucose-Capillary 163 (H) 65 - 99 mg/dL  Glucose, capillary     Status: Abnormal   Collection Time: 08/29/16  6:56 AM  Result Value Ref Range   Glucose-Capillary 199 (H) 65 - 99 mg/dL   Comment 1 Notify RN   Glucose, capillary     Status: Abnormal   Collection Time: 08/29/16 11:55 AM  Result Value Ref Range   Glucose-Capillary 149 (H) 65 - 99 mg/dL  Glucose, capillary     Status: Abnormal   Collection Time: 08/29/16  4:56 PM  Result Value Ref Range   Glucose-Capillary 147 (H) 65 - 99 mg/dL  Glucose, capillary     Status: Abnormal   Collection Time: 08/29/16  8:43 PM  Result Value Ref Range   Glucose-Capillary 172 (H) 65 - 99 mg/dL   Comment 1 Notify RN   Glucose, capillary     Status: Abnormal   Collection Time: 08/30/16  7:06 AM  Result Value Ref Range   Glucose-Capillary 162 (H) 65 - 99 mg/dL  Glucose, capillary     Status: Abnormal   Collection Time: 08/30/16 11:40 AM  Result Value Ref Range   Glucose-Capillary 198 (H) 65 - 99 mg/dL  Glucose, capillary     Status: Abnormal   Collection Time: 08/30/16  4:22 PM  Result Value Ref Range   Glucose-Capillary 121 (H) 65 - 99 mg/dL  Glucose, capillary     Status: Abnormal   Collection  Time: 08/30/16  8:05 PM  Result Value Ref Range   Glucose-Capillary 156 (H) 65 - 99 mg/dL  Glucose, capillary     Status: Abnormal   Collection Time: 08/31/16  6:35 AM  Result Value Ref Range   Glucose-Capillary 178 (H) 65 - 99 mg/dL  Glucose, capillary     Status: Abnormal   Collection Time: 08/31/16 11:19 AM  Result Value Ref Range   Glucose-Capillary 224 (H) 65 - 99 mg/dL     HEENT: normal Cardio: RRR and No murmur Resp: CTA B/L and Unlabored GI: BS positive and Nontender, nondistended Extremity:  No Edema Skin:   Intact Neuro: Lethargic, Flat, Abnormal Sensory Reduced sensation to pinch in left upper extremity greater than left lower extremity. Intact sensation on the right, Abnormal Motor Motor is 5/5 in the right deltoid, biceps, triceps, grip, hip flexor, knee extensor, ankle dorsiflexor, trace left biceps and finger flexors, 2 minus, left hip, knee extensor synergy, 0 at the ankle, Abnormal FMC Ataxic/ dec FMC and Dysarthric Musc/Skel:  Other No pain with upper extremity or lower extremity range of motion Gen. no acute distress   Assessment/Plan: 1. Functional deficits secondary to Right MCA infarct with Left Hemiparesis which require 3+ hours  per day of interdisciplinary therapy in a comprehensive inpatient rehab setting. Physiatrist is providing close team supervision and 24 hour management of active medical problems listed below. Physiatrist and rehab team continue to assess barriers to discharge/monitor patient progress toward functional and medical goals. FIM: Function - Bathing Position: Shower Body parts bathed by patient: Left arm, Chest, Abdomen, Right upper leg, Left upper leg, Right lower leg Body parts bathed by helper: Left lower leg, Buttocks, Front perineal area, Back  Function- Upper Body Dressing/Undressing What is the patient wearing?: Pull over shirt/dress Pull over shirt/dress - Perfomed by patient: Put head through opening, Pull shirt over trunk Pull  over shirt/dress - Perfomed by helper: Thread/unthread left sleeve, Thread/unthread right sleeve Assist Level: Touching or steadying assistance(Pt > 75%) Function - Lower Body Dressing/Undressing What is the patient wearing?: Pants, Non-skid slipper socks Pants- Performed by patient: Thread/unthread right pants leg Pants- Performed by helper: Pull pants up/down, Thread/unthread left pants leg Non-skid slipper socks- Performed by patient: Don/doff right sock Non-skid slipper socks- Performed by helper: Don/doff left sock Assist for footwear: Dependant Assist for lower body dressing:  (dependent)  Function - Toileting Toileting steps completed by helper: Adjust clothing prior to toileting, Adjust clothing after toileting, Performs perineal hygiene Assist level: Two helpers (per Elmo Putt, NT)  Function - Toilet Transfers Assist level to toilet: 2 helpers (per Benjamin Stain, NT) Assist level from toilet: 2 helpers  Function - Chair/bed transfer Chair/bed transfer method: Squat pivot Chair/bed transfer assist level: Maximal assist (Pt 25 - 49%/lift and lower) Chair/bed transfer assistive device: Sliding board, Armrests Mechanical lift: Stedy Chair/bed transfer details: Manual facilitation for weight shifting, Verbal cues for technique, Manual facilitation for placement, Manual facilitation for weight bearing  Function - Locomotion: Wheelchair Will patient use wheelchair at discharge?: Yes Type: Manual Max wheelchair distance: 180ft  Assist Level: Touching or steadying assistance (Pt > 75%) Assist Level: Supervision or verbal cues Assist Level: Touching or steadying assistance (Pt > 75%) Turns around,maneuvers to table,bed, and toilet,negotiates 3% grade,maneuvers on rugs and over doorsills: No Function - Locomotion: Ambulation Ambulation activity did not occur: Safety/medical concerns (pt pushes toward hemi side; unsafe) Assistive device: Rail in hallway Max distance: 8 Assist  level: 2 helpers Walk 10 feet activity did not occur: Safety/medical concerns Walk 50 feet with 2 turns activity did not occur: Safety/medical concerns Walk 150 feet activity did not occur: Safety/medical concerns Walk 10 feet on uneven surfaces activity did not occur: Safety/medical concerns  Function - Comprehension Comprehension: Auditory Comprehension assist level: Understands basic 75 - 89% of the time/ requires cueing 10 - 24% of the time  Function - Expression Expression: Verbal Expression assist level: Expresses basic 75 - 89% of the time/requires cueing 10 - 24% of the time. Needs helper to occlude trach/needs to repeat words.  Function - Social Interaction Social Interaction assist level: Interacts appropriately 75 - 89% of the time - Needs redirection for appropriate language or to initiate interaction.  Function - Problem Solving Problem solving assist level: Solves basic 50 - 74% of the time/requires cueing 25 - 49% of the time  Function - Memory Memory assist level: Recognizes or recalls 75 - 89% of the time/requires cueing 10 - 24% of the time Patient normally able to recall (first 3 days only): That he or she is in a hospital, Staff names and faces  Medical Problem List and Plan: 1.  Left hemiplegia, limitations with self-care, difficulty with transfers secondary to right MCA CVA. Cont CIR  PT, OT, speech.Team conf in am 2.  DVT Prophylaxis/Anticoagulation: Pharmaceutical: Lovenox 3. Pain Management: N/A 4. Mood: Team to provide ego support. Expressing anxiety about current situation. Has supportive family. LCSW to follow for evaluation and support.  5. Neuropsych: This patient is capable of making decisions on his own behalf. 6. Skin/Wound Care: routine pressure relief measures.  7. Fluids/Electrolytes/Nutrition: Monitor I/O. Check lytes in am. Encourage fluid intake.  8. 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 9. T2DM: Hgb A1C- 10.5 --. Continue metformin 1000mg  bid. Was started on lantus adjust per am CBG,increase to 40 units daily at bedtime, with SSI for elevated BS--will consult RD for diabetic education and start education on insulin administration.  Will monitor BS ac/hs CBG (last 3)   Recent Labs  08/30/16 2005 08/31/16 0635 08/31/16 1119  GLUCAP 156* 178* 224*    10. HTN: Monitor BP bid--on lisinopril 20mg  daily. Control is good thus far Vitals:   08/30/16 1523 08/31/16 0501  BP: 130/78 (!) 143/70  Pulse: 90 84  Resp:  18  Temp:  98.7 F (37.1 C)   11. Leucocytosis: Monitor for signs of infection. UA negative. Afebrile 12. Polycythemia: Smokes 1.5 PPD. Expect this to remain elevated during hospitalization, On NicoDerm patch 13. COPD: SOB reported--CXR negative. Encourage tobacco cessation.  Managed without medications at this time.   14. Tobacco abuse: Counsel  14. Hypoalbuminemia, start pro-stat 15. Urinary retention,cathed again this am,  suspect BPH, trial of Flomax, add urecholine LOS (Days) 6 A FACE TO FACE EVALUATION WAS PERFORMED  Chad Combs E 08/31/2016, 12:28 PM

## 2016-08-31 NOTE — Progress Notes (Signed)
Speech Language Pathology Daily Session Note  Patient Details  Name: Chad Combs MRN: [id number] Date of Birth: [birth year]  Today's Date: 08/31/2016 SLP Individual Time: 0900-1000 SLP Individual Time Calculation (min): 60 min  Short Term Goals: Week 1: SLP Short Term Goal 1 (Week 1): Given Mod A verbal cues, pt will utilize external memory aids to recall new daily information.  SLP Short Term Goal 2 (Week 1): Pt will utilize compensatory intelligibility strategies with Mod A verbal cues to achieve 75% intelligibility at the sentence level.  SLP Short Term Goal 3 (Week 1): Pt will demonstrate sustained attention in mildly distracting environment for ~30 minutes with Mod A verbal cues.  SLP Short Term Goal 4 (Week 1): Pt will complete basic, familiar problem solving tasks with Mod A verbal cues.  SLP Short Term Goal 5 (Week 1): Pt will scan to left of his environment with Mod A verbal cues.  SLP Short Term Goal 6 (Week 1): Pt will consume current diet without overt s/s of aspiration with Mod A verbal cues.   Skilled Therapeutic Interventions: Skilled treatment session focused on cognition goals. SLP facilitated session by providing Max A faded to Mod A for completion of basic problem solving tasks. Pt's ability to complete tasks is impacted by left inattention and decreased ability to implement sequential attempts at solving the problem. Pt's execution of task is disorganized even with Max A multimodal cues. Pt able to demonstrate sustained attention to tasks in mildly distracting environment for ~30 minutes with Min A verbal cues. Pt was returned to room, left upright in wheelchair with safety belt donned and all needs within reach, nursing present. Continue per current plan of care.      Function:    Cognition Comprehension Comprehension assist level: Understands basic 75 - 89% of the time/ requires cueing 10 - 24% of the time  Expression   Expression assist level: Expresses basic 75 - 89%  of the time/requires cueing 10 - 24% of the time. Needs helper to occlude trach/needs to repeat words.  Social Interaction Social Interaction assist level: Interacts appropriately 75 - 89% of the time - Needs redirection for appropriate language or to initiate interaction.  Problem Solving Problem solving assist level: Solves basic 50 - 74% of the time/requires cueing 25 - 49% of the time  Memory Memory assist level: Recognizes or recalls 75 - 89% of the time/requires cueing 10 - 24% of the time    Pain    Therapy/Group: Individual Therapy   [name] B. [name], M.S., CCC-SLP Speech-Language Pathologist   [name] [name] 08/31/2016, 10:40 AM

## 2016-08-31 NOTE — Progress Notes (Signed)
Occupational Therapy Session Note  Patient Details  Name: Chad Combs MRN: [id number] Date of Birth: 1956-05-29  Today's Date: 08/31/2016 OT Individual Time: 1000-1102 OT Individual Time Calculation (min): 62 min    Short Term Goals: Week 1:  OT Short Term Goal 1 (Week 1): Pt will maintain static sitting balance for at least 5 mins with supervision in preparation for selfcare tasks.  OT Short Term Goal 2 (Week 1): Pt will complete UB bathing with min assist in unsupported sitting.  OT Short Term Goal 3 (Week 1): Pt will complete LB bathing with max assist sit to stand.  OT Short Term Goal 4 (Week 1): Pt will donn pullover shirt in supported sitting with min assist 2 consecutive sessions.  OT Short Term Goal 5 (Week 1): Pt will donn pullup pants with max assist sit to stand for 2 consecutive sessions.   Skilled Therapeutic Interventions/Progress Updates:    Pt doffed pullover shirt to start session with min assist and min instructional cueing from wheelchair level.  Next had Mr. Babineau donn hospital gowns secondary to not having clean clothing to wear.  Once finished, therapist rolled him down to the therapy gym where he transferred to the therapy mat with max assist squat pivot.  Worked on neuromuscular re-education for the LUE and trunk during session.  Worked on posterior/anterior pelvic tilt.  Pt able to demonstrate good AROM and mobility but tends to initiate movement with his head and upper trunk at times.  He was able to maintain sitting balance with close supervision during these with good midline orientation.  Transitioned to LUE weightbearing on the mat beside of him while having him reach across his body with the RUE to touch target.  Max facilitation at the trunk and left elbow extensors in order for him to return to upright midline position after reaching.  Progressed to having him reach for cups as well, which were positioned on the left and at midline.  He was able to maintain good  midline position when reaching for the cups that were straight ahead.  Attempted sit to squat as well for placing cup, but unsafe at this time secondary to pt using momentum to assist with transitioning his bottom off of the surface of the mat.  Used tilted stool for activation of elbow extensors of the LUE.  He was able to demonstrate slight shoulder flexion to move tilted stool to target, with mod facilitation to avoid pushing to the right with his trunk or attempting forward trunk compensation to help advance the stool.  Completed scoot pivot transfer back to the wheelchair to conclude session with emphasis on slow movements with midline orientation and LUE weightbearing.  Max assist to complete scoots back to the wheelchair.  Pt returned to room with LUE placed on arm trough, safety belt in place, and call button and phone in reach on the right side.    Therapy Documentation Precautions:  Precautions Precautions: Fall Precaution Comments: L sided neglect, shoulder increased risk for subluxation; quick LOB L wihtout awareness Restrictions Weight Bearing Restrictions: No  Pain: Pain Assessment Pain Assessment: No/denies pain ADL: See Function Navigator for Current Functional Status.   Therapy/Group: Individual Therapy  Mamie Diiorio OTR/L 08/31/2016, 12:43 PM

## 2016-09-01 ENCOUNTER — Inpatient Hospital Stay (HOSPITAL_COMMUNITY): Payer: Self-pay | Admitting: Occupational Therapy

## 2016-09-01 ENCOUNTER — Inpatient Hospital Stay (HOSPITAL_COMMUNITY): Payer: Self-pay

## 2016-09-01 ENCOUNTER — Inpatient Hospital Stay (HOSPITAL_COMMUNITY): Payer: Self-pay | Admitting: Physical Therapy

## 2016-09-01 ENCOUNTER — Inpatient Hospital Stay (HOSPITAL_COMMUNITY): Payer: Self-pay | Admitting: *Deleted

## 2016-09-01 ENCOUNTER — Inpatient Hospital Stay (HOSPITAL_COMMUNITY): Payer: Self-pay | Admitting: Speech Pathology

## 2016-09-01 LAB — GLUCOSE, CAPILLARY
GLUCOSE-CAPILLARY: 181 mg/dL — AB (ref 65–99)
Glucose-Capillary: 153 mg/dL — ABNORMAL HIGH (ref 65–99)
Glucose-Capillary: 198 mg/dL — ABNORMAL HIGH (ref 65–99)
Glucose-Capillary: 251 mg/dL — ABNORMAL HIGH (ref 65–99)

## 2016-09-01 LAB — CBC
HEMATOCRIT: 48.4 % (ref 39.0–52.0)
Hemoglobin: 16.4 g/dL (ref 13.0–17.0)
MCH: 33.1 pg (ref 26.0–34.0)
MCHC: 33.9 g/dL (ref 30.0–36.0)
MCV: 97.8 fL (ref 78.0–100.0)
PLATELETS: 214 10*3/uL (ref 150–400)
RBC: 4.95 MIL/uL (ref 4.22–5.81)
RDW: 12.1 % (ref 11.5–15.5)
WBC: 11.4 10*3/uL — AB (ref 4.0–10.5)

## 2016-09-01 LAB — BASIC METABOLIC PANEL
ANION GAP: 8 (ref 5–15)
BUN: 19 mg/dL (ref 6–20)
CHLORIDE: 103 mmol/L (ref 101–111)
CO2: 24 mmol/L (ref 22–32)
CREATININE: 1.01 mg/dL (ref 0.61–1.24)
Calcium: 8.9 mg/dL (ref 8.9–10.3)
Glucose, Bld: 132 mg/dL — ABNORMAL HIGH (ref 65–99)
POTASSIUM: 3.7 mmol/L (ref 3.5–5.1)
Sodium: 135 mmol/L (ref 135–145)

## 2016-09-01 MED ORDER — FAMOTIDINE 40 MG/5ML PO SUSR
20.0000 mg | Freq: Two times a day (BID) | ORAL | Status: DC
  Administered 2016-09-01 – 2016-09-06 (×10): 20 mg via ORAL
  Filled 2016-09-01 (×12): qty 2.5

## 2016-09-01 NOTE — Evaluation (Signed)
Recreational Therapy Assessment and Plan  Patient Details  Name: Chad Combs MRN: [id number] Date of Birth: 04-Aug-1955 Today's Date: 09/01/2016  Rehab Potential: Good ELOS: 3 weeks   Assessment Problem List:      Patient Active Problem List   Diagnosis Date Noted  . Acute right MCA stroke ([place]) 08/25/2016  . Left hemiplegia ([place])   . Dysarthria, post-stroke   . Dysphagia, post-stroke   . Occlusion and stenosis of carotid artery   . Diabetes mellitus ([place])   . Benign essential HTN   . Leukocytosis   . Polycythemia vera ([place])   . Tobacco abuse   . Chronic obstructive pulmonary disease ([place])   . CVA (cerebral vascular accident) ([place]) 08/21/2016  . Hypertension 07/04/2015  . Diabetes mellitus without complication ([place]) 77/93/9030  . 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 COPD (chronic obstructive pulmonary disease) ([place]) 07/04/2015    Past Medical History:      Past Medical History:  Diagnosis Date  . Blind right eye   . COPD (chronic obstructive pulmonary disease) ([place])   . Diabetes mellitus without complication ([place])   . Hypertension    Past Surgical History:       Past Surgical History:  Procedure Laterality Date  . IVC FILTER INSERTION N/A 08/24/2016   Procedure: IVC Filter Insertion;  Surgeon: Chad Cabal, MD;  Location: [facility];  Service: Cardiovascular;  Laterality: N/A;    Assessment & Plan Clinical Impression: Patient is a 61 y.o. year old male with recent admission to the hospital on3/3/18 with fall, left sided weakness and confusion. History taken from chart review and son. UDS negative. CT head done with evolving acute infarct in right parietal and temporal lobes and indeterminate minimally displaced fracture thorough tip of nasal bone. MRI reviewed showing right MCA infarct. MRA brain done revealing occlusion of R-ICA and R-MCA with acute to early subacute large R-MCA infarct.  Patient transferred to CIR on 08/25/2016.    Pt  presents with decreased activity tolerance, decreased functional mobility, decreased balance, decreased coordination, decreased midline orientation, left inattention, decreased attention, decreased awareness, decreased problem solving, decreased safety awareness and delayed processing Limiting pt's independence with leisure/community pursuits.  Leisure History/Participation Premorbid leisure interest/current participation: [employer] store;Community - Travel (Comment);Nature - Fishing;Nature - Other (Comment) (tent camping, riding ATVs) Other Leisure Interests: Television;Movies Leisure Participation Style: Alone;With Family/Friends Awareness of Community Resources: Good-identify 3 post discharge leisure resources Psychosocial / Spiritual Social interaction - Mood/Behavior: Cooperative Academic librarian Appropriate for Education?: Yes Recreational Therapy Orientation Orientation -Reviewed with patient: Available activity resources Strengths/Weaknesses Patient Strengths/Abilities: Willingness to participate;Active premorbidly Patient weaknesses: Physical limitations TR Patient demonstrates impairments in the following area(s): Edema;Endurance;Motor;Safety  Plan Rec Therapy Plan Is patient appropriate for Therapeutic Recreation?: Yes Rehab Potential: Good Treatment times per week: Min 1 TR session/group during LOS >30 minutes Estimated Length of Stay: 3 weeks TR Treatment/Interventions: Adaptive equipment instruction;1:1 session;Balance/vestibular training;Cognitive remediation/compensation;Community reintegration;Patient/family education;Group participation (Comment);Functional mobility training;Recreation/leisure participation;Therapeutic activities;Therapeutic exercise;Visual/perceptual remediation/compensation;UE/LE Coordination activities;Wheelchair propulsion/positioning  Recommendations for other services: Neuropsych  Discharge Criteria:  Patient will be discharged from TR if patient refuses treatment 3 consecutive times without medical reason.  If treatment goals not met, if there is a change in medical status, if patient makes no progress towards goals or if patient is discharged from hospital.  The above assessment, treatment plan, treatment alternatives and goals were discussed and mutually agreed upon: by patient  [place] 09/01/2016, 4:15 PM

## 2016-09-01 NOTE — Progress Notes (Signed)
Occupational Therapy Session Note  Patient Details  Name: Chad Combs MRN: [id number] Date of Birth: 1955/07/02  Today's Date: 09/01/2016 OT Individual Time: 1100-1135 OT Individual Time Calculation (min): 35 min    Short Term Goals: Week 1:  OT Short Term Goal 1 (Week 1): Pt will maintain static sitting balance for at least 5 mins with supervision in preparation for selfcare tasks.  OT Short Term Goal 2 (Week 1): Pt will complete UB bathing with min assist in unsupported sitting.  OT Short Term Goal 3 (Week 1): Pt will complete LB bathing with max assist sit to stand.  OT Short Term Goal 4 (Week 1): Pt will donn pullover shirt in supported sitting with min assist 2 consecutive sessions.  OT Short Term Goal 5 (Week 1): Pt will donn pullup pants with max assist sit to stand for 2 consecutive sessions.   Skilled Therapeutic Interventions/Progress Updates:    Pt received supine in bed. Worked on rolling to L and using R arm to assist with pushing up to sit with facilitation for forward roll of upper shoulder. In sitting, pt maintained midline very well with S with no L lean while donning a pullover shirt with min A.  Max cues for sequencing and problem solving in task as pt was quite impulsive.  Squat pivot to w/c with mod A (2 person assist for guiding hips).  Pt stated he needed to toilet again. With grab bar was able to squat pivot to toilet with max A. Donned pants over feet with A for L.  Used bar for sit to stand with only mod A and held balance with mod A during clothing adjustment. Transferred back to chair.  Quick release belt donned, chair alarm set.  Session time over and not enough time for oral care. Nurse tech said she could assist him.  Therapy Documentation Precautions:  Precautions Precautions: Fall Precaution Comments: L sided neglect, shoulder increased risk for subluxation; quick LOB L wihtout awareness Restrictions Weight Bearing Restrictions: No   Pain: C/o heartburn,  reported it to nurse tech who will inform RN  ADL:    See Function Navigator for Current Functional Status.   Therapy/Group: Individual Therapy  [place] 09/01/2016, 1:03 PM

## 2016-09-01 NOTE — Progress Notes (Signed)
Speech Language Pathology Daily Session Note  Patient Details  Name: Chad Combs MRN: [id number] Date of Birth: January 21, 1956  Today's Date: 09/01/2016 SLP Individual Time: 1515-1600 SLP Individual Time Calculation (min): 45 min  Short Term Goals: Week 1: SLP Short Term Goal 1 (Week 1): Given Mod A verbal cues, pt will utilize external memory aids to recall new daily information.  SLP Short Term Goal 2 (Week 1): Pt will utilize compensatory intelligibility strategies with Mod A verbal cues to achieve 75% intelligibility at the sentence level.  SLP Short Term Goal 3 (Week 1): Pt will demonstrate sustained attention in mildly distracting environment for ~30 minutes with Mod A verbal cues.  SLP Short Term Goal 4 (Week 1): Pt will complete basic, familiar problem solving tasks with Mod A verbal cues.  SLP Short Term Goal 5 (Week 1): Pt will scan to left of his environment with Mod A verbal cues.  SLP Short Term Goal 6 (Week 1): Pt will consume current diet without overt s/s of aspiration with Mod A verbal cues.   Skilled Therapeutic Interventions: Skilled treatment session focused on addressing cognition goals.  Upon SLP arrival patient reporting that he needed to go to the bathroom; SLP and nurse tech provided +2 assist with [facility] transfer from bed to the commode.  Patient required Mod assist verbal cues to sequence transfers and self-care task.  SLP facilitated session by providing Max assist question cues to recall current medications and functions.  Son present for session and shared that his father did not have the best or most consistent record with medication management prior to admission.  Recommend implementation of an external aid and education/practive loading a medication box.  Continue with current plan of care.    Function:  Cognition Comprehension Comprehension assist level: Understands basic 75 - 89% of the time/ requires cueing 10 - 24% of the time  Expression   Expression assist  level: Expresses basic 75 - 89% of the time/requires cueing 10 - 24% of the time. Needs helper to occlude trach/needs to repeat words.  Social Interaction Social Interaction assist level: Interacts appropriately 75 - 89% of the time - Needs redirection for appropriate language or to initiate interaction.  Problem Solving Problem solving assist level: Solves basic 50 - 74% of the time/requires cueing 25 - 49% of the time  Memory Memory assist level: Recognizes or recalls 25 - 49% of the time/requires cueing 50 - 75% of the time    Pain Pain Assessment Pain Assessment: No/denies pain  Therapy/Group: Individual Therapy  Carmelia Roller., [place] [phone]  [place] 09/01/2016, 5:00 PM

## 2016-09-01 NOTE — Progress Notes (Signed)
Physical Therapy Session Note  Patient Details  Name: [name] MRN: [id number] Date of Birth: 12-20-1955  Today's Date: 09/01/2016 PT Individual Time: 0730-0813 PT Individual Time Calculation (min): 43 min   Short Term Goals: Week 1:  PT Short Term Goal 1 (Week 1): pt will roll R wiht mod assist PT Short Term Goal 2 (Week 1): pt will move supine> sit through R sidelying with mod assist PT Short Term Goal 3 (Week 1): pt will transfer with assistanc of 1 person consistently PT Short Term Goal 4 (Week 1): pt will propel appropriate ht w/c x 50' using hemi method with min assist PT Short Term Goal 5 (Week 1): pt will tolerate standing in static stander x 15 minutes.  Skilled Therapeutic Interventions/Progress Updates:   Pt received supine in bed and agreeable to PT. Supine>sit transfer with mod assist and moderate cues sequencing and proper use of bed features.   Sitting balance EOB with supervision assist for PT to don socks. Pt noted to require R UE support while sitting to prevent Lateral LOB to the L.   Throughout treatment pt performed squat pivot transfers x2 to L and x2 to the R with mod assist from PT as well as verbal and tactile cues for proper sequencing and increase use of LLE.   Sit<>stand with mod assist x 5 using HW for UE support mod assist to come to standing position form PT. Followed by 5 bouts of standing balance and weight shifting L and R; max assist to maintain standing balance with LLE knee blocked for stability. PT noted to have mild glute/HS activation with instrution from PT to push down through LLE to prevent fall to R. Pushing increased with increased weight shift to R.    Sitting balance EOM for lateral reaches L and R to end range with RUE. Mod assist to prevent LOB to the R from PT with max instruction for improved trunk/pelvic movement.    WC mobility through hall x 181f with min assist from PT for safety and awareness of obstacles on the L.   Patient  [facility] to [facility] and all needs met.           Therapy Documentation Precautions:  Precautions Precautions: Fall Precaution Comments: L sided neglect, shoulder increased risk for subluxation; quick LOB L wihtout awareness Restrictions Weight Bearing Restrictions: No Vital Signs: Therapy Vitals Temp: 98.6 F (37 C) Temp Source: Oral Pulse Rate: 77 Resp: 18 BP: 109/70 Patient Position (if appropriate): Lying Oxygen Therapy SpO2: 93 % O2 Device: Not Delivered Pain: 0/10     See Function Navigator for Current Functional Status.   Therapy/Group: Individual Therapy  ALorie Phenix3/14/2018, 8:14 AM

## 2016-09-01 NOTE — Progress Notes (Signed)
Physical Therapy Note  Patient Details  Name: Chad Combs MRN: [id number] Date of Birth: 1955-11-07 Today's Date: 09/01/2016  1330-1400, 30 min individual tx Pain: none  W/c propulsion on level tile; max cues for avoiding obstacles and staying on r side of hall.  Sit>stand in [place] with max cues to slow down, mod assist for LLE foot placement and forward wt shift.   Therapeutic activity in high sitting on Stedy seat, to reach L and then place playing card on matching area of card held to his R.  LOB L several times without awareness.  Standing in [place] for same activity with mod assist consistently for over-shift to L, but no pushing L. Pt exhausted; returned to bed to rest in partial L side lying with pillows for positioning, with alarm set and all needs within reach.   See function navigator for current status.  [name] 09/01/2016, 4:21 PM

## 2016-09-01 NOTE — Progress Notes (Signed)
Subjective/Complaints:  Seen in PT, using mirror to work on sitting balance and avoid leaning to left   ROS   Denies chest pain, shortness of breath, nausea, vomiting, diarrhea, constipation.  Objective: Vital Signs: Blood pressure 109/70, pulse 77, temperature 98.6 F (37 C), temperature source Oral, resp. rate 18, height 5' 9"  (1.753 m), weight 91.5 kg (201 lb 11.5 oz), SpO2 93 %. No results found. Results for orders placed or performed during the hospital encounter of 08/25/16 (from the past 72 hour(s))  Glucose, capillary     Status: Abnormal   Collection Time: 08/29/16  6:56 AM  Result Value Ref Range   Glucose-Capillary 199 (H) 65 - 99 mg/dL   Comment 1 Notify RN   Glucose, capillary     Status: Abnormal   Collection Time: 08/29/16 11:55 AM  Result Value Ref Range   Glucose-Capillary 149 (H) 65 - 99 mg/dL  Glucose, capillary     Status: Abnormal   Collection Time: 08/29/16  4:56 PM  Result Value Ref Range   Glucose-Capillary 147 (H) 65 - 99 mg/dL  Glucose, capillary     Status: Abnormal   Collection Time: 08/29/16  8:43 PM  Result Value Ref Range   Glucose-Capillary 172 (H) 65 - 99 mg/dL   Comment 1 Notify RN   Glucose, capillary     Status: Abnormal   Collection Time: 08/30/16  7:06 AM  Result Value Ref Range   Glucose-Capillary 162 (H) 65 - 99 mg/dL  Glucose, capillary     Status: Abnormal   Collection Time: 08/30/16 11:40 AM  Result Value Ref Range   Glucose-Capillary 198 (H) 65 - 99 mg/dL  Glucose, capillary     Status: Abnormal   Collection Time: 08/30/16  4:22 PM  Result Value Ref Range   Glucose-Capillary 121 (H) 65 - 99 mg/dL  Glucose, capillary     Status: Abnormal   Collection Time: 08/30/16  8:05 PM  Result Value Ref Range   Glucose-Capillary 156 (H) 65 - 99 mg/dL  Glucose, capillary     Status: Abnormal   Collection Time: 08/31/16  6:35 AM  Result Value Ref Range   Glucose-Capillary 178 (H) 65 - 99 mg/dL  Glucose, capillary     Status: Abnormal    Collection Time: 08/31/16 11:19 AM  Result Value Ref Range   Glucose-Capillary 224 (H) 65 - 99 mg/dL  Glucose, capillary     Status: Abnormal   Collection Time: 08/31/16  4:28 PM  Result Value Ref Range   Glucose-Capillary 136 (H) 65 - 99 mg/dL  Basic metabolic panel     Status: Abnormal   Collection Time: 09/01/16  4:34 AM  Result Value Ref Range   Sodium 135 135 - 145 mmol/L   Potassium 3.7 3.5 - 5.1 mmol/L   Chloride 103 101 - 111 mmol/L   CO2 24 22 - 32 mmol/L   Glucose, Bld 132 (H) 65 - 99 mg/dL   BUN 19 6 - 20 mg/dL   Creatinine, Ser 1.01 0.61 - 1.24 mg/dL   Calcium 8.9 8.9 - 10.3 mg/dL   GFR calc non Af Amer >60 >60 mL/min   GFR calc Af Amer >60 >60 mL/min    Comment: (NOTE) The eGFR has been calculated using the CKD EPI equation. This calculation has not been validated in all clinical situations. eGFR's persistently <60 mL/min signify possible Chronic Kidney Disease.    Anion gap 8 5 - 15  CBC     Status: Abnormal  Collection Time: 09/01/16  4:34 AM  Result Value Ref Range   WBC 11.4 (H) 4.0 - 10.5 K/uL   RBC 4.95 4.22 - 5.81 MIL/uL   Hemoglobin 16.4 13.0 - 17.0 g/dL   HCT 48.4 39.0 - 52.0 %   MCV 97.8 78.0 - 100.0 fL   MCH 33.1 26.0 - 34.0 pg   MCHC 33.9 30.0 - 36.0 g/dL   RDW 12.1 11.5 - 15.5 %   Platelets 214 150 - 400 K/uL     HEENT: normal Cardio: RRR and No murmur Resp: CTA B/L and Unlabored GI: BS positive and Nontender, nondistended Extremity:  No Edema Skin:   Intact Neuro: Lethargic, Flat, Abnormal Sensory Reduced sensation to pinch in left upper extremity greater than left lower extremity. Intact sensation on the right, Abnormal Motor Motor is 5/5 in the right deltoid, biceps, triceps, grip, hip flexor, knee extensor, ankle dorsiflexor, trace left biceps and finger flexors, 2 minus, left hip, knee extensor synergy, 0 at the ankle, Abnormal FMC Ataxic/ dec FMC and Dysarthric Musc/Skel:  Other No pain with upper extremity or lower extremity range  of motion Gen. no acute distress   Assessment/Plan: 1. Functional deficits secondary to Right MCA infarct with Left Hemiparesis which require 3+ hours per day of interdisciplinary therapy in a comprehensive inpatient rehab setting. Physiatrist is providing close team supervision and 24 hour management of active medical problems listed below. Physiatrist and rehab team continue to assess barriers to discharge/monitor patient progress toward functional and medical goals. FIM: Function - Bathing Position: Shower Body parts bathed by patient: Left arm, Chest, Abdomen, Right upper leg, Left upper leg, Right lower leg Body parts bathed by helper: Left lower leg, Buttocks, Front perineal area, Back  Function- Upper Body Dressing/Undressing What is the patient wearing?: Pull over shirt/dress Pull over shirt/dress - Perfomed by patient: Put head through opening, Pull shirt over trunk Pull over shirt/dress - Perfomed by helper: Thread/unthread left sleeve, Thread/unthread right sleeve Assist Level: Touching or steadying assistance(Pt > 75%) Function - Lower Body Dressing/Undressing What is the patient wearing?: Pants, Non-skid slipper socks Pants- Performed by patient: Thread/unthread right pants leg Pants- Performed by helper: Pull pants up/down, Thread/unthread left pants leg Non-skid slipper socks- Performed by patient: Don/doff right sock Non-skid slipper socks- Performed by helper: Don/doff left sock Assist for footwear: Dependant Assist for lower body dressing:  (dependent)  Function - Toileting Toileting steps completed by helper: Adjust clothing prior to toileting, Performs perineal hygiene, Adjust clothing after toileting Assist level: Two helpers  Function - Toilet Transfers Assist level to toilet: 2 helpers Assist level from toilet: 2 helpers  Function - Chair/bed transfer Chair/bed transfer method: Squat pivot Chair/bed transfer assist level: Maximal assist (Pt 25 - 49%/lift  and lower) Chair/bed transfer assistive device: Sliding board, Armrests Mechanical lift: Stedy Chair/bed transfer details: Manual facilitation for weight shifting, Verbal cues for technique, Manual facilitation for placement, Manual facilitation for weight bearing  Function - Locomotion: Wheelchair Will patient use wheelchair at discharge?: Yes Type: Manual Max wheelchair distance: 146f  Assist Level: Touching or steadying assistance (Pt > 75%) Assist Level: Supervision or verbal cues Assist Level: Touching or steadying assistance (Pt > 75%) Turns around,maneuvers to table,bed, and toilet,negotiates 3% grade,maneuvers on rugs and over doorsills: No Function - Locomotion: Ambulation Ambulation activity did not occur: Safety/medical concerns (pt pushes toward hemi side; unsafe) Assistive device: Rail in hallway Max distance: 8 Assist level: 2 helpers Walk 10 feet activity did not occur: Safety/medical concerns Walk  50 feet with 2 turns activity did not occur: Safety/medical concerns Walk 150 feet activity did not occur: Safety/medical concerns Walk 10 feet on uneven surfaces activity did not occur: Safety/medical concerns  Function - Comprehension Comprehension: Auditory Comprehension assist level: Understands basic 75 - 89% of the time/ requires cueing 10 - 24% of the time  Function - Expression Expression: Verbal Expression assist level: Expresses basic 75 - 89% of the time/requires cueing 10 - 24% of the time. Needs helper to occlude trach/needs to repeat words.  Function - Social Interaction Social Interaction assist level: Interacts appropriately 75 - 89% of the time - Needs redirection for appropriate language or to initiate interaction.  Function - Problem Solving Problem solving assist level: Solves basic 50 - 74% of the time/requires cueing 25 - 49% of the time  Function - Memory Memory assist level: Recognizes or recalls 75 - 89% of the time/requires cueing 10 - 24% of  the time Patient normally able to recall (first 3 days only): That he or she is in a hospital, Staff names and faces  Medical Problem List and Plan: 1.  Left hemiplegia, limitations with self-care, difficulty with transfers secondary to right MCA CVA. Cont CIR PT, OT, speech.Team conference today please see physician documentation under team conference tab, met with team face-to-face to discuss problems,progress, and goals. Formulized individual treatment plan based on medical history, underlying problem and comorbidities. 2.  DVT Prophylaxis/Anticoagulation: Pharmaceutical: Lovenox 3. Pain Management: N/A 4. Mood: Team to provide ego support. Expressing anxiety about current situation. Has supportive family. LCSW to follow for evaluation and support.  5. Neuropsych: This patient is capable of making decisions on his own behalf. 6. Skin/Wound Care: routine pressure relief measures.  7. Fluids/Electrolytes/Nutrition: Monitor I/O. Check lytes in am. Encourage fluid intake.  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 9. T2DM: Hgb A1C- 10.5 --. Continue metformin 1080m bid. Was started on lantus adjust per am CBG,increase to 40 units daily at bedtime, with SSI for elevated BS--will consult RD for diabetic education and start education on insulin administration.  Will monitor BS ac/hs CBG (last 3)   Recent Labs  08/31/16 0635 08/31/16 1119 08/31/16 1628  GLUCAP 178* 224* 136*    10. HTN: Monitor BP bid--on lisinopril 298mdaily. BP starting to dip reduce to 1039mitals:   08/31/16 1354 09/01/16 0429  BP: 126/69 109/70  Pulse: (!) 106 77  Resp: 18 18  Temp: 97.3 F (36.3 C) 98.6 F (37 C)   11. Leucocytosis: Monitor for signs of infection. UA negative. Afebrile, Hgb 11.4 this am 12. Polycythemia: Smokes 1.5 PPD. Expect this to remain elevated during hospitalization, On NicoDerm patch 13. COPD: SOB reported--CXR negative.  Encourage tobacco cessation.  Managed without medications at this time.   14. Tobacco abuse: Counsel  14. Hypoalbuminemia, start pro-stat 15. Urinary retention,cathed again this am,  suspect BPH, trial of Flomax, add urecholine, ICP volume 325m23mis am LOS (Days) 7 A FACE TO FACE EVALUATION WAS PERFORMED  Tonja Jezewski E 09/01/2016, 6:22 AM

## 2016-09-01 NOTE — Patient Care Conference (Signed)
Inpatient RehabilitationTeam Conference and Plan of Care Update Date: 09/01/2016   Time: 11:00 Am    Patient Name: Chad Combs      Medical Record Number: [id number]  Date of Birth: January 24, 1956 Sex: Male         Room/Bed: 4W11C/4W11C-01 Payor Info: Payor: MEDICAID POTENTIAL / Plan: MEDICAID POTENTIAL / Product Type: *No Product type* /    Admitting Diagnosis: R CVA  Admit Date/Time:  08/25/2016  2:36 PM Admission Comments: No comment available   Primary Diagnosis:  <principal problem not specified> Principal Problem: <principal problem not specified>  Patient Active Problem List   Diagnosis Date Noted  . Acute right MCA stroke ([place]) 08/25/2016  . Left hemiplegia ([place])   . Dysarthria, post-stroke   . Dysphagia, post-stroke   . Occlusion and stenosis of carotid artery   . Diabetes mellitus ([place])   . Benign essential HTN   . Leukocytosis   . Polycythemia vera ([place])   . Tobacco abuse   . Chronic obstructive pulmonary disease ([place])   . CVA (cerebral vascular accident) ([place]) 08/21/2016  . Hypertension 07/04/2015  . Diabetes mellitus without complication ([place]) 73/53/2992  . 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 COPD (chronic obstructive pulmonary disease) ([place]) 07/04/2015    Expected Discharge Date: Expected Discharge Date: 09/21/16  Team Members Present: Physician leading conference: Ilean Skill, PsyD;Dr. Alysia Penna Social Worker Present: Ovidio Kin, LCSW Nurse Present: Heather Roberts, RN PT Present: Georjean Mode, PT;Austin Berline Lopes, Dustin Folks, PT OT Present: Clyda Greener, OT SLP Present: Gunnar Fusi, SLP PPS Coordinator present : Daiva Nakayama, RN, CRRN     Current Status/Progress Goal Weekly Team Focus  Medical   Patient still leaning to the left side in sitting and pushing to the left side with standing. Urinary retention, blood sugars still uncontrolled  Normal bladder function  Medication management for bladder dysfunction   Bowel/Bladder   continent B/B  urinary retention at times q6hr scans/I/O caths if no void or scan greater than 313ml  min assist  monitor B/B q shift   Swallow/Nutrition/ Hydration   dysphagia 3 with thin liquids, full nursing supervision - Min A for complete oral clearing  Supervision  use of compensatory swallow strategies, lingual sweep for left pocketing   ADL's   Pt needs min assist for UB bathing and UB dressing.  Max assist for LB bathing and LB dressing.  Brunnstrum stage II in the arm and stage I in the hand with trace movement in the shoulder noted.  Left visual inattention.  min assist to supervision  neuromuscular re-education, therapeutic exercise, transfer training, balance retraining, visual compensation education, pt/family education   Mobility   mod asisst squat pivot transfers, S wc x 120', max/total assist gait using railing- instability A-P with frequent LOB  min assist basic and car transfer, supervsion w/c x 150' controlled and 50' home, min assist gait x 50'  decreasing pusher tendencies, midline orientation, neuro re-ed, transfers, locomotion   Communication   Mod A for ~75% speech intelligibility at the phrase/simple sentence level  Supervision  use of speech intelligibility strategies at the phrase level for rate, over-articulation   Safety/Cognition/ Behavioral Observations  no unsafe behavior/patient with safety awareness / needs tele sitter DC'D  Supervision  Monitor q shift   Pain   C/O restless leg pain/prn neurontin 100mg   pain less than or equal to 2  monitor pain q shift   Skin   no skin breakdown  no skin breakdown this admission  monitor  skin q shift    Rehab Goals Patient on target to meet rehab goals: Yes *See Care Plan and progress notes for long and short-term goals.  Barriers to Discharge: Still heavy, physical assist, incontinent and unable to void    Possible Resolutions to Barriers:  See above, continue rehabilitation    Discharge Planning/Teaching Needs:  Home with son  staying with for two weeks, then pt's sister coming for one week. Hoping he will be doing well enough to stay by himself during the daytime. Applying for Medicaid and disability on appeal with a lawyer      Team Discussion:  Goals of min assist level. Urinary retension having to I & O cath if not voided in 6-8 hours. Flomax started. DC telemonitor not needed. Blind in right eye. Pushes to right side. MD adjusting diabetes medicines. L-arm trace movement. Will need 24 hr care at discharge. Pt participating, some of his deficits are from his first stroke.  Revisions to Treatment Plan:  DC 4/3   Continued Need for Acute Rehabilitation Level of Care: The patient requires daily medical management by a physician with specialized training in physical medicine and rehabilitation for the following conditions: Daily direction of a multidisciplinary physical rehabilitation program to ensure safe treatment while eliciting the highest outcome that is of practical value to the patient.: Yes Daily medical management of patient stability for increased activity during participation in an intensive rehabilitation regime.: Yes Daily analysis of laboratory values and/or radiology reports with any subsequent need for medication adjustment of medical intervention for : Neurological problems;Urological problems;Diabetes problems  Chad Combs, Chad Combs 09/01/2016, 1:27 PM

## 2016-09-01 NOTE — Progress Notes (Signed)
Social Work Patient ID: Chad Combs, male   DOB: 03-27-1956, 61 y.o.   MRN: [id number]  Met with pt and spoke with son via telephone to inform of team conference goals min assist level and target  discharge date of 4/3. Son concerned that once three weeks are up when he has coverage what if he still requires care, what are his options. Discussed will ask team if can get pt to mod/i transfer wheelchair  Level so he would safer at home when no one is there. Discussed he is not insured and not able to go to a NH unless he would want to private pay to be in one. This is too expensive for pt, according to his son. Will work on discharge home with safest possible level. Son to bring him in shoes since therapy team requests this. Continue to work on a safe discharge plan.

## 2016-09-01 NOTE — Progress Notes (Signed)
Occupational Therapy Session Note  Patient Details  Name: Chad Combs MRN: [id number] Date of Birth: [birth year]  Today's Date: 09/01/2016 OT Individual Time: 8502-7741 OT Individual Time Calculation (min): 43 min    Short Term Goals: Week 1:  OT Short Term Goal 1 (Week 1): Pt will maintain static sitting balance for at least 5 mins with supervision in preparation for selfcare tasks.  OT Short Term Goal 2 (Week 1): Pt will complete UB bathing with min assist in unsupported sitting.  OT Short Term Goal 3 (Week 1): Pt will complete LB bathing with max assist sit to stand.  OT Short Term Goal 4 (Week 1): Pt will donn pullover shirt in supported sitting with min assist 2 consecutive sessions.  OT Short Term Goal 5 (Week 1): Pt will donn pullup pants with max assist sit to stand for 2 consecutive sessions.   Skilled Therapeutic Interventions/Progress Updates:    Pt completed transfer to the 3:1 over the toilet with max assist stand pivot to begin session.  He also needed max assist to manage brief and gowns as well.  Pt unsuccessful on BM so utilized Stedy for sit to stand in preparation for transfer over to the tub bench.  Total  assist for toilet hygiene in the [place], with therapist completing while pt focused on standing balance with mod assist.  Transferred to the tub bench with use of Stedy.  Once sitting before therapist could remove Steady, pt was working on removing gowns.  Demonstrated LOB to the left hitting his head on the tile shower wall before therapist could stop him.  Therapist assisted with returning pt back to midline and he continued undressing and proceeded with shower without any difficulty or distraction.  He reported slight pain in his head initially but it resolved after a couple of mins.  Pt needed min assist for UB bathing with max hand over hand assist to integrate the LUE as a stabilizer and for washing the RUE.  He was able to complete LB bathing with mod assist as well,  needing therapist to assist with crossing the LLE over the right knee to wash his foot.  No further LOB noted in the shower from sitting position.  Once finished, utilized Stedy for transfer out to the wheelchair secondary to time.  Pt left in wheelchair with hospital gown on and safety belt in place.  Therapist assisted with donning gripper socks as well.  Pt left in wheelchair with call button in reach.   Therapy Documentation Precautions:  Precautions Precautions: Fall Precaution Comments: L sided neglect, shoulder increased risk for subluxation; quick LOB L wihtout awareness Restrictions Weight Bearing Restrictions: No  Pain: Pain Assessment Pain Assessment: No/denies pain ADL: See Function Navigator for Current Functional Status.   Therapy/Group: Individual Therapy  Brodyn Depuy OTR/L 09/01/2016, 12:13 PM

## 2016-09-02 ENCOUNTER — Inpatient Hospital Stay (HOSPITAL_COMMUNITY): Payer: Self-pay

## 2016-09-02 ENCOUNTER — Inpatient Hospital Stay (HOSPITAL_COMMUNITY): Payer: Self-pay | Admitting: Occupational Therapy

## 2016-09-02 ENCOUNTER — Inpatient Hospital Stay (HOSPITAL_COMMUNITY): Payer: Self-pay | Admitting: Physical Therapy

## 2016-09-02 ENCOUNTER — Inpatient Hospital Stay (HOSPITAL_COMMUNITY): Payer: Self-pay | Admitting: Speech Pathology

## 2016-09-02 LAB — GLUCOSE, CAPILLARY
GLUCOSE-CAPILLARY: 152 mg/dL — AB (ref 65–99)
GLUCOSE-CAPILLARY: 143 mg/dL — AB (ref 65–99)
GLUCOSE-CAPILLARY: 163 mg/dL — AB (ref 65–99)
Glucose-Capillary: 211 mg/dL — ABNORMAL HIGH (ref 65–99)

## 2016-09-02 NOTE — Progress Notes (Addendum)
Physical Therapy Note  Patient Details  Name: Chad Combs MRN: [id number] Date of Birth: [birth year] Today's Date: 09/02/2016  [id number], 30 min individual tx Pain: none reported  Pt asleep,snoring loudly, but aroused with calling his name and touching shoulder.    neuromuscular re-education via multimodal cues and assistance as needed for LLE for 10 x 1  Supine: L knne to chest> max extension; isolated L ankle PF, bil hip internal rotation, L hip abduction/adduction to move L heel off of bed;  10 x 2 R straight leg raise, bil lower trunk rotation. Pt reported he needed to urinate.  Bed mobility to set EOB and use urinal with no void; Erin NTpresent. PT and Junie Panning used Stedy to transfer pt to toilet.  Pt left in care of Erin, NT  See function navigator for current status.   Elena Davia 09/02/2016, 12:25 PM

## 2016-09-02 NOTE — Progress Notes (Signed)
Speech Language Pathology Weekly Progress and Session Note  Patient Details  Name: Chad Combs MRN: [id number] Date of Birth: [birth year]  Beginning of progress report period: August 26, 2016 End of progress report period: September 02, 2016  Today's Date: 09/02/2016 SLP Individual Time: 1345-1430 SLP Individual Time Calculation (min): 45 min  Short Term Goals: Week 1: SLP Short Term Goal 1 (Week 1): Given Mod A verbal cues, pt will utilize external memory aids to recall new daily information.  SLP Short Term Goal 1 - Progress (Week 1): Met SLP Short Term Goal 2 (Week 1): Pt will utilize compensatory intelligibility strategies with Mod A verbal cues to achieve 75% intelligibility at the sentence level.  SLP Short Term Goal 2 - Progress (Week 1): Met SLP Short Term Goal 3 (Week 1): Pt will demonstrate sustained attention in mildly distracting environment for ~30 minutes with Mod A verbal cues.  SLP Short Term Goal 3 - Progress (Week 1): Met SLP Short Term Goal 4 (Week 1): Pt will complete basic, familiar problem solving tasks with Mod A verbal cues.  SLP Short Term Goal 4 - Progress (Week 1): Not met SLP Short Term Goal 5 (Week 1): Pt will scan to left of his environment with Mod A verbal cues.  SLP Short Term Goal 5 - Progress (Week 1): Not met SLP Short Term Goal 6 (Week 1): Pt will consume current diet without overt s/s of aspiration with Mod A verbal cues.  SLP Short Term Goal 6 - Progress (Week 1): Met    New Short Term Goals: Week 2: SLP Short Term Goal 1 (Week 2): Given Min A verbal cues, pt will utilize external memory aids to recall new daily information.  SLP Short Term Goal 2 (Week 2): Pt will utilize compensatory intelligibility strategies with Min A verbal cues to achieve 75% intelligibility at the sentence level.  SLP Short Term Goal 3 (Week 2): Pt will demonstrate sustained attention in mildly distracting environment for ~30 minutes with Min A verbal cues.  SLP Short Term Goal 4  (Week 2): Pt will complete basic, familiar problem solving tasks with Mod A verbal cues.  SLP Short Term Goal 5 (Week 2): Pt will scan to left of his environment with Mod A verbal cues.  SLP Short Term Goal 6 (Week 2): Pt will consume current diet without overt s/s of aspiration with Min A verbal cues.   Weekly Progress Updates: Pt has made good functional progress this reporting period as demonstrated by meeting 4 of 6 STGs. Pt has made great progress in the areas of sustained attention to task, use of speech intelligibility strategies and consuming current diet with decreased verbal cues. Pt continues to require Mod A verbal cues for completion of basic, familiar tasks, scanning to left of his environment and Min A for speech intelligibility and use of external memory aids. Pt requires skilled ST to target these areas and increase pt's functional independence, reduce caregiver burden.      Intensity: Minumum of 1-2 x/day, 30 to 90 minutes Frequency: 3 to 5 out of 7 days Duration/Length of Stay: 4/2 Treatment/Interventions: Cognitive remediation/compensation;Cueing hierarchy;Dysphagia/aspiration precaution training;Functional tasks;Medication managment;Patient/family education;Therapeutic Activities;Environmental controls;Internal/external aids;Oral motor exercises;Therapeutic Exercise   Daily Session  Skilled Therapeutic Interventions: Skilled treatment session focused on cognition goals. SLP facilitated session by providing Mod A verbal cues for completion of basic, familiar task. Pt required Mod A verbal cues to scan to the left of his visual field. Pt able to sustain attention  for ~45 minutes in mildly distracting environment with Mod A faded to Min A verbal cues. Pt was returned to room, left upright in wheelchair, safety belt donned and chair alarm on . All needs within reach. Continue per current plan of care.     Function:     Cognition Comprehension Comprehension assist level:  Understands basic 75 - 89% of the time/ requires cueing 10 - 24% of the time  Expression   Expression assist level: Expresses basic 75 - 89% of the time/requires cueing 10 - 24% of the time. Needs helper to occlude trach/needs to repeat words.  Social Interaction Social Interaction assist level: Interacts appropriately 75 - 89% of the time - Needs redirection for appropriate language or to initiate interaction.  Problem Solving Problem solving assist level: Solves basic 50 - 74% of the time/requires cueing 25 - 49% of the time  Memory Memory assist level: Recognizes or recalls 25 - 49% of the time/requires cueing 50 - 75% of the time   General    Pain Pain Assessment Pain Assessment: No/denies pain  Therapy/Group: Individual Therapy   Nehal Witting B. [name], M.S., CCC-SLP Speech-Language Pathologist   Krystianna Soth [name] 09/02/2016, 2:08 PM

## 2016-09-02 NOTE — Progress Notes (Signed)
Occupational Therapy Weekly Progress Note  Patient Details  Name: Chad AMBROCIO MRN: [id number] Date of Birth: 16-May-1956  Beginning of progress report period: August 26, 2016 End of progress report period: September 02, 2016  Today's Date: 09/02/2016 OT Individual Time: 1105-1200 OT Individual Time Calculation (min): 55 min    Patient has met 5 of 5 short term goals.  Mr. Manzo is continuing to make steady progress with OT.  Static and dynamic sitting balance continue to improve with close supervision noted when sitting statically with RUE supported.  Min assist for dynamic sitting balance overall as it pertains to balance during bathing and dressing tasks.  He continues to demonstrate some left inattention as well as left UE and LE hemiparesis.  Currently, he needs max assist for all stand pivot transfers as well as max assist for sit to stand with LB dressing and toileting tasks.  Brunnstrum stage II movement is noted in the left arm with stage I in the hand.  He is able to demonstrate trace shoulder flexion and slight internal rotation.  He is able to demonstrate UB dressing with min assist overall but continues to need mod demonstrational cueing for orientation and sequencing.  Feel overall his progress has been good.  Feel he will continue to benefit from CIR level therapies with expected discharge 4/3.     Patient continues to demonstrate the following deficits: muscle weakness, impaired timing and sequencing, unbalanced muscle activation and decreased coordination, field cut, decreased attention to left, decreased awareness, decreased problem solving, decreased safety awareness and decreased memory and decreased sitting balance, decreased standing balance, decreased postural control, hemiplegia and decreased balance strategies and therefore will continue to benefit from skilled OT intervention to enhance overall performance with BADL.  Patient progressing toward long term goals..  Continue plan of  care.  OT Short Term Goals Week 2:  OT Short Term Goal 1 (Week 2): Pt will complete stand pivot transfer to the 3:1 with mod assist.  OT Short Term Goal 2 (Week 2): Pt will complete sit to stand for pulling pants over hips with min assist for 2 consecutive sessions.  OT Short Term Goal 3 (Week 2): Pt will complete UB dressing with supervision for 2 consecutive sessions.   OT Short Term Goal 4 (Week 2): Pt will perform self AAROM exercises for the LUE following handout with min instructional cueing.   Skilled Therapeutic Interventions/Progress Updates:    Pt completed bathing and dressing during session.  Max assist for stand pivot transfer to the tub bench from the wheelchair.  Pt with decreased left knee and hip control exhibiting increased left knee buckling at times as well as increased hyperextension.  Min assist overall for UB bathing with max hand over hand to use the RUE at a diminished level for washing the RUE. He did remember to try and include use of the LUE as a stabilizer for holding washcloth to apply soap, but still needed max assist to complete.  He was able to complete LB bathing with max assist sit to stand.  Mod assist needed for crossing and maintaining the LLE over the right knee throughout session for both washing as well as for donning clothing.  During dressing pt needed mod demonstrational cueing for orientation of clothing and for remembering to start dressing the impaired side first.  Max assist for sit to stand when pulling pants over hips as well.  Pt stood with mod assist for completion of combing hair.  At conclusion  of session pt was left in the wheelchair with call button in reach and safety belt in place.  Arm trough in place for supporting the LUE as well.    Therapy Documentation Precautions:  Precautions Precautions: Fall Precaution Comments: L sided neglect, shoulder increased risk for subluxation; quick LOB L wihtout awareness Restrictions Weight Bearing  Restrictions: No  Pain: Pain Assessment Pain Assessment: No/denies pain ADL: See Function Navigator for Current Functional Status.   Therapy/Group: Individual Therapy  Caileigh Canche OTR/L 09/02/2016, 12:53 PM

## 2016-09-02 NOTE — Progress Notes (Signed)
Physical Therapy Weekly Progress Note  Patient Details  Name: Chad Combs MRN: [id number] Date of Birth: 1956-04-28  Beginning of progress report period: August 26, 2016 End of progress report period: September 02, 2016   Today's Date: 09/02/2016 PT Individual Time: 0901-1000 PT Individual Time Calculation (min): 59 min   Patient has met 4 of 5 short term goals. Pt has demonstrated improved sitting balance requiring only min assist to maintain sitting balance, improved endurance, decreased pushing in standing, Active movement noted now in the LLE in gravity eliminated position, improved awareness of the L side, but still requires cues for obstacles with WC mobility.   Patient continues to demonstrate the following deficits muscle weakness, decreased cardiorespiratoy endurance, impaired timing and sequencing, unbalanced muscle activation, decreased coordination and decreased motor planning, decreased visual perceptual skills, decreased midline orientation, decreased attention to left and decreased motor planning and decreased sitting balance, decreased standing balance, decreased postural control, hemiplegia and decreased balance strategies and therefore will continue to benefit from skilled PT intervention to increase functional independence with mobility.  Patient progressing toward long term goals..  Continue plan of care.  PT Short Term Goals Week 1:  PT Short Term Goal 1 (Week 1): pt will roll R wiht mod assist PT Short Term Goal 1 - Progress (Week 1): Met PT Short Term Goal 2 (Week 1): pt will move supine> sit through R sidelying with mod assist PT Short Term Goal 2 - Progress (Week 1): Met PT Short Term Goal 3 (Week 1): pt will transfer with assistanc of 1 person consistently PT Short Term Goal 3 - Progress (Week 1): Met PT Short Term Goal 4 (Week 1): pt will propel appropriate ht w/c x 50' using hemi method with min assist PT Short Term Goal 4 - Progress (Week 1): Met PT Short Term Goal 5  (Week 1): pt will tolerate standing in static stander x 15 minutes. PT Short Term Goal 5 - Progress (Week 1): Progressing toward goal Week 2:  PT Short Term Goal 1 (Week 2): Patient will ambulate 13f with max Assist of 1 PT Short Term Goal 2 (Week 2): Patient will perform sit<>stand with min assist and LRAD  PT Short Term Goal 3 (Week 2): Patient will perform sqaut pivot transfer with min assist  PT Short Term Goal 4 (Week 2): Patient will maintain standing balance with mod assist from PT.   Skilled Therapeutic Interventions/Progress Updates:    Pt received supine in bed and agreeable to PT. Supine>sit transfer with mod assist, heavy use of rails, and cues for improve trunk control and upright posture. Squat pivot [facility]. PT donned pt shoes while sitting in WC.   WC mobility with Hemi technique instructed by PT with supervision assist from PT with min cues for awareness of the L side.   PT instructed pt in gait training at rail in hall 2 x 266fwith Max assist +2 for WC follow as well as total assist LLE limb advancement and stabilization of the L knee. Ace wrap applied DF L ankle to allow use improved foot clearance.   Supine NMR. Bridges x 12, flexion/extension in synergistic pattern x 15 LLE, Sidelying hip /knee flexion and isolated knee flexion.   Squat pivot transfers completed x 3  To R and L throughout treatment with mod assist from PT and min cues for set up and safety.   Patient returned to room and left  sitting in [facility] with call bell in reach and all needs met.         Therapy Documentation Precautions:  Precautions Precautions: Fall Precaution Comments: L sided neglect, shoulder increased risk for subluxation; quick LOB L wihtout awareness Restrictions Weight Bearing Restrictions: No Vital Signs: Therapy Vitals Temp Source: Oral Pulse Rate: 70 BP: (!) 113/56 Patient Position (if appropriate): Lying Pain: 0/10   See  Function Navigator for Current Functional Status.  Therapy/Group: Individual Therapy  [name] 09/02/2016, 9:56 AM

## 2016-09-02 NOTE — Progress Notes (Signed)
Subjective/Complaints:  Patient without new issues overnight. Aware of discharge date. No pain complaints  ROS   Denies chest pain, shortness of breath, nausea, vomiting, diarrhea, constipation.  Objective: Vital Signs: Blood pressure (!) 113/56, pulse 70, temperature 98.6 F (37 C), temperature source Oral, resp. rate 16, height 5' 9"  (1.753 m), weight 92.1 kg (203 lb 0.7 oz), SpO2 97 %. No results found. Results for orders placed or performed during the hospital encounter of 08/25/16 (from the past 72 hour(s))  Glucose, capillary     Status: Abnormal   Collection Time: 08/30/16 11:40 AM  Result Value Ref Range   Glucose-Capillary 198 (H) 65 - 99 mg/dL  Glucose, capillary     Status: Abnormal   Collection Time: 08/30/16  4:22 PM  Result Value Ref Range   Glucose-Capillary 121 (H) 65 - 99 mg/dL  Glucose, capillary     Status: Abnormal   Collection Time: 08/30/16  8:05 PM  Result Value Ref Range   Glucose-Capillary 156 (H) 65 - 99 mg/dL  Glucose, capillary     Status: Abnormal   Collection Time: 08/31/16  6:35 AM  Result Value Ref Range   Glucose-Capillary 178 (H) 65 - 99 mg/dL  Glucose, capillary     Status: Abnormal   Collection Time: 08/31/16 11:19 AM  Result Value Ref Range   Glucose-Capillary 224 (H) 65 - 99 mg/dL  Glucose, capillary     Status: Abnormal   Collection Time: 08/31/16  4:28 PM  Result Value Ref Range   Glucose-Capillary 136 (H) 65 - 99 mg/dL  Basic metabolic panel     Status: Abnormal   Collection Time: 09/01/16  4:34 AM  Result Value Ref Range   Sodium 135 135 - 145 mmol/L   Potassium 3.7 3.5 - 5.1 mmol/L   Chloride 103 101 - 111 mmol/L   CO2 24 22 - 32 mmol/L   Glucose, Bld 132 (H) 65 - 99 mg/dL   BUN 19 6 - 20 mg/dL   Creatinine, Ser 1.01 0.61 - 1.24 mg/dL   Calcium 8.9 8.9 - 10.3 mg/dL   GFR calc non Af Amer >60 >60 mL/min   GFR calc Af Amer >60 >60 mL/min    Comment: (NOTE) The eGFR has been calculated using the CKD EPI equation. This  calculation has not been validated in all clinical situations. eGFR's persistently <60 mL/min signify possible Chronic Kidney Disease.    Anion gap 8 5 - 15  CBC     Status: Abnormal   Collection Time: 09/01/16  4:34 AM  Result Value Ref Range   WBC 11.4 (H) 4.0 - 10.5 K/uL   RBC 4.95 4.22 - 5.81 MIL/uL   Hemoglobin 16.4 13.0 - 17.0 g/dL   HCT 48.4 39.0 - 52.0 %   MCV 97.8 78.0 - 100.0 fL   MCH 33.1 26.0 - 34.0 pg   MCHC 33.9 30.0 - 36.0 g/dL   RDW 12.1 11.5 - 15.5 %   Platelets 214 150 - 400 K/uL  Glucose, capillary     Status: Abnormal   Collection Time: 09/01/16  6:38 AM  Result Value Ref Range   Glucose-Capillary 153 (H) 65 - 99 mg/dL   Comment 1 Notify RN   Glucose, capillary     Status: Abnormal   Collection Time: 09/01/16 11:39 AM  Result Value Ref Range   Glucose-Capillary 181 (H) 65 - 99 mg/dL  Glucose, capillary     Status: Abnormal   Collection Time: 09/01/16  4:44 PM  Result Value Ref Range   Glucose-Capillary 198 (H) 65 - 99 mg/dL  Glucose, capillary     Status: Abnormal   Collection Time: 09/01/16  9:00 PM  Result Value Ref Range   Glucose-Capillary 251 (H) 65 - 99 mg/dL  Glucose, capillary     Status: Abnormal   Collection Time: 09/02/16  7:01 AM  Result Value Ref Range   Glucose-Capillary 152 (H) 65 - 99 mg/dL     HEENT: normal Cardio: RRR and No murmur Resp: CTA B/L and Unlabored GI: BS positive and Nontender, nondistended Extremity:  No Edema Skin:   Intact Neuro: Lethargic, Flat, Abnormal Sensory Reduced sensation to pinch in left upper extremity greater than left lower extremity. Intact sensation on the right, Abnormal Motor Motor is 5/5 in the right deltoid, biceps, triceps, grip, hip flexor, knee extensor, ankle dorsiflexor, trace left biceps and finger flexors, 2 minus, left hip, knee extensor synergy, 0 at the ankle, Abnormal FMC Ataxic/ dec FMC and Dysarthric Musc/Skel:  Other No pain with upper extremity or lower extremity range of motion Gen.  no acute distress   Assessment/Plan: 1. Functional deficits secondary to Right MCA infarct with Left Hemiparesis which require 3+ hours per day of interdisciplinary therapy in a comprehensive inpatient rehab setting. Physiatrist is providing close team supervision and 24 hour management of active medical problems listed below. Physiatrist and rehab team continue to assess barriers to discharge/monitor patient progress toward functional and medical goals. FIM: Function - Bathing Position: Shower Body parts bathed by patient: Left arm, Chest, Abdomen, Right upper leg, Left upper leg, Right lower leg Body parts bathed by helper: Left lower leg, Buttocks, Front perineal area, Back  Function- Upper Body Dressing/Undressing What is the patient wearing?: Pull over shirt/dress Pull over shirt/dress - Perfomed by patient: Put head through opening, Pull shirt over trunk, Thread/unthread right sleeve Pull over shirt/dress - Perfomed by helper: Thread/unthread left sleeve Assist Level: Touching or steadying assistance(Pt > 75%) Function - Lower Body Dressing/Undressing What is the patient wearing?: Pants, Non-skid slipper socks Position: Other (comment) (sitting on BSC over toilet) Pants- Performed by patient: Thread/unthread right pants leg Pants- Performed by helper: Pull pants up/down, Thread/unthread left pants leg Non-skid slipper socks- Performed by patient: Don/doff right sock Non-skid slipper socks- Performed by helper: Don/doff right sock, Don/doff left sock Assist for footwear: Dependant Assist for lower body dressing:  (dependent)  Function - Toileting Toileting steps completed by helper: Adjust clothing prior to toileting, Performs perineal hygiene, Adjust clothing after toileting Assist level: Two helpers  Function - Air cabin crew transfer assistive device: Grab bar Assist level to toilet: Maximal assist (Pt 25 - 49%/lift and lower) Assist level from toilet: Maximal  assist (Pt 25 - 49%/lift and lower)  Function - Chair/bed transfer Chair/bed transfer method: Squat pivot Chair/bed transfer assist level: Moderate assist (Pt 50 - 74%/lift or lower) Chair/bed transfer assistive device: Bedrails Mechanical lift: Stedy Chair/bed transfer details: Manual facilitation for weight shifting, Verbal cues for precautions/safety, Verbal cues for technique  Function - Locomotion: Wheelchair Will patient use wheelchair at discharge?: Yes Type: Manual Max wheelchair distance: 157 Assist Level: Supervision or verbal cues Assist Level: Supervision or verbal cues Assist Level: Supervision or verbal cues Turns around,maneuvers to table,bed, and toilet,negotiates 3% grade,maneuvers on rugs and over doorsills: No Function - Locomotion: Ambulation Ambulation activity did not occur: Safety/medical concerns (pt pushes toward hemi side; unsafe) Assistive device: Rail in hallway Max distance: 8 Assist level: 2 helpers Walk 10 feet activity  did not occur: Safety/medical concerns Walk 50 feet with 2 turns activity did not occur: Safety/medical concerns Walk 150 feet activity did not occur: Safety/medical concerns Walk 10 feet on uneven surfaces activity did not occur: Safety/medical concerns  Function - Comprehension Comprehension: Auditory Comprehension assist level: Understands basic 75 - 89% of the time/ requires cueing 10 - 24% of the time  Function - Expression Expression: Verbal Expression assist level: Expresses basic 75 - 89% of the time/requires cueing 10 - 24% of the time. Needs helper to occlude trach/needs to repeat words.  Function - Social Interaction Social Interaction assist level: Interacts appropriately 75 - 89% of the time - Needs redirection for appropriate language or to initiate interaction.  Function - Problem Solving Problem solving assist level: Solves basic 50 - 74% of the time/requires cueing 25 - 49% of the time  Function - Memory Memory  assist level: Recognizes or recalls 25 - 49% of the time/requires cueing 50 - 75% of the time Patient normally able to recall (first 3 days only): That he or she is in a hospital, Staff names and faces  Medical Problem List and Plan: 1.  Left hemiplegia, limitations with self-care, difficulty with transfers secondary to right MCA CVA. Cont CIR PT, OT, speech. 2.  DVT Prophylaxis/Anticoagulation: Pharmaceutical: Lovenox 3. Pain Management: N/A 4. Mood: Team to provide ego support. Expressing anxiety about current situation. Has supportive family. LCSW to follow for evaluation and support.  5. Neuropsych: This patient is capable of making decisions on his own behalf. 6. Skin/Wound Care: routine pressure relief measures.  7. Fluids/Electrolytes/Nutrition: Monitor I/O. Check lytes in am. Encourage fluid intake.  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 9. T2DM: Hgb A1C- 10.5 --. Continue metformin 1016m bid. Was started on lantus adjust per am CBG,increase to 45 units daily at bedtime, with SSI for elevated BS--will consult RD for diabetic education and start education on insulin administration.  Will monitor BS ac/hs CBG (last 3)   Recent Labs  09/01/16 1644 09/01/16 2100 09/02/16 0701  GLUCAP 198* 251* 152*    10. HTN: Monitor BP bid--on lisinopril 24mdaily. BP starting to dip reduce to 1061mitals:   09/02/16 0500 09/02/16 0800  BP: 120/74 (!) 113/56  Pulse: 72 70  Resp: 16   Temp: 98.6 F (37 C)    11. Leucocytosis: Monitor for signs of infection. UA negative. Afebrile,WBC 11.4 12. Polycythemia: Smokes 1.5 PPD. Expect this to remain elevated during hospitalization, On NicoDerm patch 13. COPD: SOB reported--CXR negative. Encourage tobacco cessation.  Managed without medications at this time.   14. Tobacco abuse: Counsel  14. Hypoalbuminemia, start pro-stat 15. Urinary retention,cathed again this am,  suspect BPH,  trial of Flomax, add urecholine, no ICP reported since 3/13 LOS (Days) 8 A FACE TO FACE EVALUATION WAS PERFORMED  KIRSTEINS,ANDREW E 09/02/2016, 8:57 AM

## 2016-09-03 ENCOUNTER — Inpatient Hospital Stay (HOSPITAL_COMMUNITY): Payer: Self-pay | Admitting: Speech Pathology

## 2016-09-03 ENCOUNTER — Inpatient Hospital Stay (HOSPITAL_COMMUNITY): Payer: Self-pay | Admitting: Occupational Therapy

## 2016-09-03 ENCOUNTER — Inpatient Hospital Stay (HOSPITAL_COMMUNITY): Payer: Self-pay | Admitting: Physical Therapy

## 2016-09-03 LAB — GLUCOSE, CAPILLARY
GLUCOSE-CAPILLARY: 153 mg/dL — AB (ref 65–99)
GLUCOSE-CAPILLARY: 136 mg/dL — AB (ref 65–99)
Glucose-Capillary: 194 mg/dL — ABNORMAL HIGH (ref 65–99)
Glucose-Capillary: 131 mg/dL — ABNORMAL HIGH (ref 65–99)

## 2016-09-03 MED ORDER — INSULIN GLARGINE 100 UNIT/ML ~~LOC~~ SOLN
45.0000 [IU] | Freq: Every day | SUBCUTANEOUS | Status: DC
  Administered 2016-09-03 – 2016-10-01 (×29): 45 [IU] via SUBCUTANEOUS
  Filled 2016-09-03 (×32): qty 0.45

## 2016-09-03 NOTE — Progress Notes (Signed)
Speech Language Pathology Daily Session Note  Patient Details  Name: Chad Combs MRN: [id number] Date of Birth: 04-11-1956  Today's Date: 09/03/2016 SLP Individual Time: 1300-1330 SLP Individual Time Calculation (min): 30 min  Short Term Goals: Week 2: SLP Short Term Goal 1 (Week 2): Given Min A verbal cues, pt will utilize external memory aids to recall new daily information.  SLP Short Term Goal 2 (Week 2): Pt will utilize compensatory intelligibility strategies with Min A verbal cues to achieve 75% intelligibility at the sentence level.  SLP Short Term Goal 3 (Week 2): Pt will demonstrate sustained attention in mildly distracting environment for ~30 minutes with Min A verbal cues.  SLP Short Term Goal 4 (Week 2): Pt will complete basic, familiar problem solving tasks with Mod A verbal cues.  SLP Short Term Goal 5 (Week 2): Pt will scan to left of his environment with Mod A verbal cues.  SLP Short Term Goal 6 (Week 2): Pt will consume current diet without overt s/s of aspiration with Min A verbal cues.   Skilled Therapeutic Interventions: Skilled treatment session focused on cognition goals. SLP facilitated session by providing Max A multimodal cues for sustained attention for ~10 minute intervals d/t fatigue.  Pt able to complete simple basic familiar problem solving tasks. Pt left in bed, nursing present and all needs within reach. Continue per current plan of care.      Function:    Cognition Comprehension Comprehension assist level: Understands basic 75 - 89% of the time/ requires cueing 10 - 24% of the time  Expression   Expression assist level: Expresses basic 75 - 89% of the time/requires cueing 10 - 24% of the time. Needs helper to occlude trach/needs to repeat words.  Social Interaction Social Interaction assist level: Interacts appropriately 75 - 89% of the time - Needs redirection for appropriate language or to initiate interaction.  Problem Solving Problem solving assist  level: Solves basic 50 - 74% of the time/requires cueing 25 - 49% of the time  Memory Memory assist level: Recognizes or recalls 75 - 89% of the time/requires cueing 10 - 24% of the time    Pain Pain Assessment Pain Assessment: No/denies pain Pain Score: 0-No pain  Therapy/Group: Individual Therapy   Chad Combs B. [name], M.S., CCC-SLP Speech-Language Pathologist   Johnnell Liou 09/03/2016, 1:25 PM

## 2016-09-03 NOTE — Progress Notes (Signed)
Occupational Therapy Session Note  Patient Details  Name: Chad Combs MRN: [id number] Date of Birth: 18-Dec-1955  Today's Date: 09/03/2016 OT Individual Time: 0903-1005 OT Individual Time Calculation (min): 62 min    Short Term Goals: Week 2:  OT Short Term Goal 1 (Week 2): Pt will complete stand pivot transfer to the 3:1 with mod assist.  OT Short Term Goal 2 (Week 2): Pt will complete sit to stand for pulling pants over hips with min assist for 2 consecutive sessions.  OT Short Term Goal 3 (Week 2): Pt will complete UB dressing with supervision for 2 consecutive sessions.   OT Short Term Goal 4 (Week 2): Pt will perform self AAROM exercises for the LUE following handout with min instructional cueing.   Skilled Therapeutic Interventions/Progress Updates:    Pt completed transfer to the wheelchair with mod assist squat pivot and then to the tub bench in the bathroom with max assist stand pivot.  Max facilitation needed for advancement of the LLE as well as for stabilization of the left hip and knee with weightbearing.  Min assist for UB bathing with max hand over hand needed for washing of the RUE with integration of the left hand.  Max assist for sit to stand in order to complete washing peri area.  He was able to cross the LLE over the right knee with min assist to maintain in order to wash his feet as well as for dressing tasks.  He still needs mod instructional cueing for sequencing hemi dressing techniques.  Donned pull up shorts over feet with min assist but still needs max assist for standing to pull over his bottom.  Therapist assist for donning gripper socks and shoes secondary to time.  Pt left in wheelchair with call button in reach and safety belt in place.    Therapy Documentation Precautions:  Precautions Precautions: Fall Precaution Comments: L sided neglect, shoulder increased risk for subluxation; quick LOB L wihtout awareness Restrictions Weight Bearing Restrictions:  No  Vital Signs: Oxygen Therapy SpO2: 96 % O2 Device: Not Delivered Pain: Pain Assessment Pain Assessment: No/denies pain Pain Score: 0-No pain ADL: See Function Navigator for Current Functional Status.   Therapy/Group: Individual Therapy  Lucus Lambertson OTR/L 09/03/2016, 12:35 PM

## 2016-09-03 NOTE — Progress Notes (Signed)
Subjective/Complaints:  Slept okay, bleeding without evidence of aspiration. According to nurses aid. No catheterization overnight, patient reports bowel movement this morning  ROS   Denies chest pain, shortness of breath, nausea, vomiting, diarrhea, constipation.  Objective: Vital Signs: Blood pressure (!) 150/85, pulse 86, temperature 98 F (36.7 C), temperature source Oral, resp. rate 18, height 5' 9"  (1.753 m), weight 92.1 kg (203 lb 0.7 oz), SpO2 95 %. No results found. Results for orders placed or performed during the hospital encounter of 08/25/16 (from the past 72 hour(s))  Glucose, capillary     Status: Abnormal   Collection Time: 08/31/16  6:35 AM  Result Value Ref Range   Glucose-Capillary 178 (H) 65 - 99 mg/dL  Glucose, capillary     Status: Abnormal   Collection Time: 08/31/16 11:19 AM  Result Value Ref Range   Glucose-Capillary 224 (H) 65 - 99 mg/dL  Glucose, capillary     Status: Abnormal   Collection Time: 08/31/16  4:28 PM  Result Value Ref Range   Glucose-Capillary 136 (H) 65 - 99 mg/dL  Basic metabolic panel     Status: Abnormal   Collection Time: 09/01/16  4:34 AM  Result Value Ref Range   Sodium 135 135 - 145 mmol/L   Potassium 3.7 3.5 - 5.1 mmol/L   Chloride 103 101 - 111 mmol/L   CO2 24 22 - 32 mmol/L   Glucose, Bld 132 (H) 65 - 99 mg/dL   BUN 19 6 - 20 mg/dL   Creatinine, Ser 1.01 0.61 - 1.24 mg/dL   Calcium 8.9 8.9 - 10.3 mg/dL   GFR calc non Af Amer >60 >60 mL/min   GFR calc Af Amer >60 >60 mL/min    Comment: (NOTE) The eGFR has been calculated using the CKD EPI equation. This calculation has not been validated in all clinical situations. eGFR's persistently <60 mL/min signify possible Chronic Kidney Disease.    Anion gap 8 5 - 15  CBC     Status: Abnormal   Collection Time: 09/01/16  4:34 AM  Result Value Ref Range   WBC 11.4 (H) 4.0 - 10.5 K/uL   RBC 4.95 4.22 - 5.81 MIL/uL   Hemoglobin 16.4 13.0 - 17.0 g/dL   HCT 48.4 39.0 - 52.0 %    MCV 97.8 78.0 - 100.0 fL   MCH 33.1 26.0 - 34.0 pg   MCHC 33.9 30.0 - 36.0 g/dL   RDW 12.1 11.5 - 15.5 %   Platelets 214 150 - 400 K/uL  Glucose, capillary     Status: Abnormal   Collection Time: 09/01/16  6:38 AM  Result Value Ref Range   Glucose-Capillary 153 (H) 65 - 99 mg/dL   Comment 1 Notify RN   Glucose, capillary     Status: Abnormal   Collection Time: 09/01/16 11:39 AM  Result Value Ref Range   Glucose-Capillary 181 (H) 65 - 99 mg/dL  Glucose, capillary     Status: Abnormal   Collection Time: 09/01/16  4:44 PM  Result Value Ref Range   Glucose-Capillary 198 (H) 65 - 99 mg/dL  Glucose, capillary     Status: Abnormal   Collection Time: 09/01/16  9:00 PM  Result Value Ref Range   Glucose-Capillary 251 (H) 65 - 99 mg/dL  Glucose, capillary     Status: Abnormal   Collection Time: 09/02/16  7:01 AM  Result Value Ref Range   Glucose-Capillary 152 (H) 65 - 99 mg/dL  Glucose, capillary     Status:  Abnormal   Collection Time: 09/02/16 12:10 PM  Result Value Ref Range   Glucose-Capillary 143 (H) 65 - 99 mg/dL  Glucose, capillary     Status: Abnormal   Collection Time: 09/02/16  4:39 PM  Result Value Ref Range   Glucose-Capillary 211 (H) 65 - 99 mg/dL  Glucose, capillary     Status: Abnormal   Collection Time: 09/02/16  8:33 PM  Result Value Ref Range   Glucose-Capillary 163 (H) 65 - 99 mg/dL     HEENT: normal Cardio: RRR and No murmur Resp: CTA B/L and Unlabored GI: BS positive and Nontender, nondistended Extremity:  No Edema Skin:   Intact Neuro: Lethargic, Flat, Abnormal Sensory Reduced sensation to pinch in left upper extremity greater than left lower extremity. Intact sensation on the right, Abnormal Motor Motor is 5/5 in the right deltoid, biceps, triceps, grip, hip flexor, knee extensor, ankle dorsiflexor, trace left biceps and finger flexors, 2 minus, left hip, knee extensor synergy, 0 at the ankle, Abnormal FMC Ataxic/ dec FMC and Dysarthric Musc/Skel:  Other No  pain with upper extremity or lower extremity range of motion Gen. no acute distress   Assessment/Plan: 1. Functional deficits secondary to Right MCA infarct with Left Hemiparesis which require 3+ hours per day of interdisciplinary therapy in a comprehensive inpatient rehab setting. Physiatrist is providing close team supervision and 24 hour management of active medical problems listed below. Physiatrist and rehab team continue to assess barriers to discharge/monitor patient progress toward functional and medical goals. FIM: Function - Bathing Position: Shower Body parts bathed by patient: Left arm, Chest, Abdomen, Right upper leg, Left upper leg, Right lower leg Body parts bathed by helper: Left lower leg, Buttocks, Front perineal area, Back, Right arm  Function- Upper Body Dressing/Undressing What is the patient wearing?: Pull over shirt/dress Pull over shirt/dress - Perfomed by patient: Put head through opening, Pull shirt over trunk, Thread/unthread right sleeve, Thread/unthread left sleeve Pull over shirt/dress - Perfomed by helper: Thread/unthread left sleeve Assist Level: Touching or steadying assistance(Pt > 75%) Function - Lower Body Dressing/Undressing What is the patient wearing?: Pants, Non-skid slipper socks Position: Wheelchair/chair at sink Pants- Performed by patient: Thread/unthread right pants leg Pants- Performed by helper: Thread/unthread left pants leg, Pull pants up/down Non-skid slipper socks- Performed by patient: Don/doff right sock Non-skid slipper socks- Performed by helper: Don/doff left sock Assist for footwear: Dependant Assist for lower body dressing:  (dependent)  Function - Toileting Toileting steps completed by helper: Adjust clothing prior to toileting Toileting Assistive Devices: Other (comment) (steadty) Assist level: Two helpers  Function - Air cabin crew transfer assistive device: Mechanical lift Assist level to toilet: 2  helpers Assist level from toilet: 2 helpers  Function - Chair/bed transfer Chair/bed transfer Magdalena: Squat pivot Chair/bed transfer assist level: Moderate assist (Pt 50 - 74%/lift or lower) Chair/bed transfer assistive device: Armrests Mechanical lift: Stedy Chair/bed transfer details: Manual facilitation for weight shifting, Verbal cues for precautions/safety, Verbal cues for technique  Function - Locomotion: Wheelchair Will patient use wheelchair at discharge?: Yes Type: Manual Max wheelchair distance: 190f  Assist Level: Supervision or verbal cues Assist Level: Supervision or verbal cues Assist Level: Supervision or verbal cues Turns around,maneuvers to table,bed, and toilet,negotiates 3% grade,maneuvers on rugs and over doorsills: No Function - Locomotion: Ambulation Ambulation activity did not occur: Safety/medical concerns (pt pushes toward hemi side; unsafe) Assistive device: Rail in hallway Max distance: 24f Assist level: 2 helpers Walk 10 feet activity did not occur: Safety/medical  concerns Assist level: 2 helpers Walk 50 feet with 2 turns activity did not occur: Safety/medical concerns Walk 150 feet activity did not occur: Safety/medical concerns Walk 10 feet on uneven surfaces activity did not occur: Safety/medical concerns  Function - Comprehension Comprehension: Auditory Comprehension assist level: Understands basic 75 - 89% of the time/ requires cueing 10 - 24% of the time  Function - Expression Expression: Verbal Expression assist level: Expresses basic 75 - 89% of the time/requires cueing 10 - 24% of the time. Needs helper to occlude trach/needs to repeat words.  Function - Social Interaction Social Interaction assist level: Interacts appropriately 75 - 89% of the time - Needs redirection for appropriate language or to initiate interaction.  Function - Problem Solving Problem solving assist level: Solves basic 50 - 74% of the time/requires cueing 25 - 49%  of the time  Function - Memory Memory assist level: Recognizes or recalls 25 - 49% of the time/requires cueing 50 - 75% of the time Patient normally able to recall (first 3 days only): That he or she is in a hospital, Staff names and faces, Current season, Location of own room  Medical Problem List and Plan: 1.  Left hemiplegia, limitations with self-care, difficulty with transfers secondary to right MCA CVA. Cont CIR PT, OT, speech. 2.  DVT Prophylaxis/Anticoagulation: Pharmaceutical: Lovenox 3. Pain Management: N/A 4. Mood: Team to provide ego support. Expressing anxiety about current situation. Has supportive family. LCSW to follow for evaluation and support.  5. Neuropsych: This patient is capable of making decisions on his own behalf. 6. Skin/Wound Care: routine pressure relief measures.  7. Fluids/Electrolytes/Nutrition: Monitor I/O. Check lytes in am. Encourage fluid intake.  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 9. T2DM: Hgb A1C- 10.5 --. Continue metformin 1093m bid. Was started on lantus adjust per am CBG,increase to 45 units daily at bedtime, with SSI for elevated BS--will consult RD for diabetic education and start education on insulin administration.  Will monitor BS ac/hs, goal 115 to 180 CBG (last 3)   Recent Labs  09/02/16 1210 09/02/16 1639 09/02/16 2033  GLUCAP 143* 211* 163*    10. HTN: Monitor BP bid--on lisinopril 221mdaily.some fluctuation Vitals:   09/02/16 1300 09/03/16 0611  BP: 136/87 (!) 150/85  Pulse: 91 86  Resp: 20 18  Temp: 98.6 F (37 C) 98 F (36.7 C)   11. Leucocytosis: Monitor for signs of infection. UA negative. Afebrile,WBC 11.4 12. Polycythemia: Smokes 1.5 PPD. Expect this to remain elevated during hospitalization, On NicoDerm patch 13. COPD: SOB reported--CXR negative. Encourage tobacco cessation.  Managed without medications at this time.   14. Tobacco abuse: Counsel  14.  Hypoalbuminemia, start pro-stat 15. Urinary retention,cathed again this am,  suspect BPH, trial of Flomax, add urecholine, no ICP reported since 3/13, PVR <20036mOS (Days) 9 A FACE TO FACE EVALUATION WAS PERFORMED  Ceirra Belli E 09/03/2016, 6:20 AM

## 2016-09-03 NOTE — Progress Notes (Signed)
Physical Therapy Session Note  Patient Details  Name: Chad Combs MRN: [id number] Date of Birth: December 30, 1955  Today's Date: 09/03/2016 PT Individual Time: 1030-1130 PT Individual Time Calculation (min): 60 min   Short Term Goals: Week 2:  PT Short Term Goal 1 (Week 2): Patient will ambulate 70f with max Assist of 1 PT Short Term Goal 2 (Week 2): Patient will perform sit<>stand with min assist and LRAD  PT Short Term Goal 3 (Week 2): Patient will perform sqaut pivot transfer with min assist  PT Short Term Goal 4 (Week 2): Patient will maintain standing balance with mod assist from PT.   Skilled Therapeutic Interventions/Progress Updates:    Session 1 Pt received sitting in WC and agreeable to PT  WC mobility with supervision assist using hemi technique with cues for visual scanning to prevent hitting obstacles on L.   Squat pivot transfers with Mod assist x 5 to L and R. Moderate cues from PT for proper UE placement, LE placement and improved head/hips relationship.   Sit<>supine. With mod assist form PT to control the LLE and max cues for safety due to poor awareness of the LLE.   Supine NMR including AAROM heel slides, Bridges, hip adduction, abduction, LLE flexion/extension in synergy pattern.   Gait training x 254fat rail in hall with total A +2 for LLE limb advancement, weight shifting, and LLE knee stabilization; WC follow also provided. Pt able to demonstrate improve Quad activation to stabilize LLE, but unable to generate hip flexion to advance the LLE.   [facility] returned too room and [facility] and all needs met.     Session 2:   PT attempted to see patient for afternoon treatment. Pt asleep in bed and aroused with great effort. PT retrieved WC from bathroom and patient had returned to sleep. Unable to arouse pt at this point. Will re-attempt at later time/date.         Therapy Documentation Precautions:  Precautions Precautions:  Fall Precaution Comments: L sided neglect, shoulder increased risk for subluxation; quick LOB L wihtout awareness Restrictions Weight Bearing Restrictions: No General: PT Amount of Missed Time (min): 45 Minutes PT Missed Treatment Reason: Patient fatigue Pain: Pain Assessment Pain Assessment: No/denies pain Pain Score: 0-No pain   See Function Navigator for Current Functional Status.   Therapy/Group: Individual Therapy  AuLorie Phenix/16/2018, 12:50 PM

## 2016-09-04 ENCOUNTER — Inpatient Hospital Stay (HOSPITAL_COMMUNITY): Payer: Self-pay | Admitting: *Deleted

## 2016-09-04 DIAGNOSIS — E1140 Type 2 diabetes mellitus with diabetic neuropathy, unspecified: Secondary | ICD-10-CM

## 2016-09-04 LAB — GLUCOSE, CAPILLARY
Glucose-Capillary: 108 mg/dL — ABNORMAL HIGH (ref 65–99)
Glucose-Capillary: 197 mg/dL — ABNORMAL HIGH (ref 65–99)
Glucose-Capillary: 133 mg/dL — ABNORMAL HIGH (ref 65–99)
Glucose-Capillary: 146 mg/dL — ABNORMAL HIGH (ref 65–99)

## 2016-09-04 NOTE — Progress Notes (Signed)
Chad Combs is a 61 y.o. male 1955/11/21 [id number]  Subjective: No new complaints. Reports cramp in L hip/ant thigh last pm, now resolved. Otherwise slept well. Feeling OK.  Objective: Vital signs in last 24 hours: Temp:  [98 F (36.7 C)-98.6 F (37 C)] 98 F (36.7 C) (03/17 0530) Pulse Rate:  [81-86] 81 (03/17 0530) Resp:  [18] 18 (03/17 0530) BP: (110-133)/(77-81) 110/77 (03/17 0530) SpO2:  [92 %-98 %] 92 % (03/17 0907) Weight change:  Last BM Date: 09/03/16  Intake/Output from previous day: 03/16 0701 - 03/17 0700 In: 600 [P.O.:600] Out: -   Physical Exam General: No apparent distress    Lungs: Normal effort. Lungs clear to auscultation, no crackles or wheezes. Cardiovascular: Regular rate and rhythm, no edema Neurological: No new neurological deficits: dysarthric, L HP  Lab Results: BMET    Component Value Date/Time   NA 135 09/01/2016 0434   K 3.7 09/01/2016 0434   CL 103 09/01/2016 0434   CO2 24 09/01/2016 0434   GLUCOSE 132 (H) 09/01/2016 0434   BUN 19 09/01/2016 0434   CREATININE 1.01 09/01/2016 0434   CALCIUM 8.9 09/01/2016 0434   GFRNONAA >60 09/01/2016 0434   GFRAA >60 09/01/2016 0434   CBC    Component Value Date/Time   WBC 11.4 (H) 09/01/2016 0434   RBC 4.95 09/01/2016 0434   HGB 16.4 09/01/2016 0434   HCT 48.4 09/01/2016 0434   PLT 214 09/01/2016 0434   MCV 97.8 09/01/2016 0434   MCH 33.1 09/01/2016 0434   MCHC 33.9 09/01/2016 0434   RDW 12.1 09/01/2016 0434   LYMPHSABS 3.2 08/26/2016 0602   MONOABS 1.0 08/26/2016 0602   EOSABS 0.1 08/26/2016 0602   BASOSABS 0.0 08/26/2016 0602   CBG's (last 3):   Recent Labs  09/03/16 1630 09/03/16 2046 09/04/16 0643  GLUCAP 131* 136* 108*   LFT's Lab Results  Component Value Date   ALT 79 (H) 08/26/2016   AST 44 (H) 08/26/2016   ALKPHOS 61 08/26/2016   BILITOT 1.4 (H) 08/26/2016    Studies/Results: No results found.  Medications:  I have reviewed the patient's current  medications. Scheduled Medications: . aspirin  81 mg Oral Daily  . atorvastatin  40 mg Oral q1800  . bethanechol  25 mg Oral TID  . clopidogrel  75 mg Oral Daily  . enoxaparin (LOVENOX) injection  40 mg Subcutaneous Q24H  . famotidine  20 mg Oral BID  . feeding supplement (PRO-STAT SUGAR FREE 64)  30 mL Oral BID  . fluticasone  1 spray Each Nare Daily  . insulin aspart  0-20 Units Subcutaneous TID WC & HS  . insulin glargine  45 Units Subcutaneous Daily  . insulin starter kit- pen needles  1 kit Other Once  . lisinopril  20 mg Oral Daily  . metFORMIN  1,000 mg Oral BID WC  . mometasone-formoterol  2 puff Inhalation BID  . nicotine  21 mg Transdermal Daily  . tamsulosin  0.4 mg Oral QPC supper   PRN Medications: acetaminophen, alum & mag hydroxide-simeth, bisacodyl, bisacodyl, diphenhydrAMINE, gabapentin, guaiFENesin-dextromethorphan, polyethylene glycol, prochlorperazine **OR** prochlorperazine **OR** prochlorperazine, senna-docusate, sodium phosphate, traZODone  Assessment/Plan: Active Problems:   Acute right MCA stroke (HCC)   Left hemiplegia (HCC)   Dysarthria, post-stroke   Dysphagia, post-stroke   Occlusion and stenosis of carotid artery   Diabetes mellitus (HCC)   Benign essential HTN   Leukocytosis   Polycythemia vera (HCC)   Tobacco abuse   Chronic obstructive  pulmonary disease ([place])   1. R MCA CVA with  L HP and dysarthria - continue CIR with PT/OT os ongoing - continue med mgmt of secondary risk factors contributing to CVA and follow up vasc post DC for 75% L ICA occlusion (100% occlusion on R ICA) 2. DM2 with neuropathy - cbgs reasonable - continue Lantus + metformin with SSI 3. Hypertension - blood pressure stable - continue same 4. Tobacco abuse - Smoking cessation instruction/counseling given 5. COPD - no active symptoms today   Length of stay, days: 10   Chalice Philbert A. Asa Lente, MD 09/04/2016, 11:21 AM

## 2016-09-04 NOTE — Progress Notes (Signed)
Physical Therapy Session Note  Patient Details  Name: Chad Combs MRN: [id number] Date of Birth: Jan 24, 1956  Today's Date: 09/04/2016 PT Individual Time: 1000-1100 PT Individual Time Calculation (min): 60 min   Short Term Goals: Week 2:  PT Short Term Goal 1 (Week 2): Patient will ambulate 49f with max Assist of 1 PT Short Term Goal 2 (Week 2): Patient will perform sit<>stand with min assist and LRAD  PT Short Term Goal 3 (Week 2): Patient will perform sqaut pivot transfer with min assist  PT Short Term Goal 4 (Week 2): Patient will maintain standing balance with mod assist from PT.   Skilled Therapeutic Interventions/Progress Updates: Pt presented in bed agreeable to therapy. Performed supine to sit with minA for truncal support and LE placement. Performed squat pivot transfer to R with modA. Propelled 1071fwith hemi technqiue with mod cues for L visual scanning. Gait training with wall rail and +1 chair follow. Sit to stand with wall rail with minA. Ambulated 75f11f 1 maxA with PTA advancing LLE and blocking L knee due to buckling. Second attempt pt ambulated additional 75ft73f same manner. Pt fatigue noted after ambulation.  Performed squat pivot transfer mat with modA. Performed LLE knee flexion/ext with ball with PTA facilitating movement. Pt returned to w/c squat pivot with modA and propelled to bed. Pt requested to return to bed via squat pivot and required maxA sit to supine with total assist for boosting to HOB.[facility] with pt in bed, alarm on and all needs met.      Therapy Documentation Precautions:  Precautions Precautions: Fall Precaution Comments: L sided neglect, shoulder increased risk for subluxation; quick LOB L wihtout awareness Restrictions Weight Bearing Restrictions: No General:   Vital Signs: Oxygen Therapy SpO2: 92 % O2 Device: Not Delivered   See Function Navigator for Current Functional Status.   Therapy/Group: Individual Therapy  Nysa Sarin   Lai Hendriks, PTA  09/04/2016, 12:44 PM

## 2016-09-05 ENCOUNTER — Inpatient Hospital Stay (HOSPITAL_COMMUNITY): Payer: Self-pay

## 2016-09-05 LAB — GLUCOSE, CAPILLARY
GLUCOSE-CAPILLARY: 130 mg/dL — AB (ref 65–99)
GLUCOSE-CAPILLARY: 168 mg/dL — AB (ref 65–99)
GLUCOSE-CAPILLARY: 143 mg/dL — AB (ref 65–99)
Glucose-Capillary: 143 mg/dL — ABNORMAL HIGH (ref 65–99)

## 2016-09-05 NOTE — Progress Notes (Signed)
Chad Combs is a 61 y.o. male [birth year] [id number]  Subjective: Denies problems overnight. Slept better  Objective: Vital signs in last 24 hours: Temp:  [97.9 F (36.6 C)-98 F (36.7 C)] 98 F (36.7 C) (03/18 0500) Pulse Rate:  [85-89] 85 (03/18 0500) Resp:  [16-18] 18 (03/18 0500) BP: (116-139)/(75-81) 116/81 (03/18 0908) SpO2:  [94 %-98 %] 98 % (03/18 0500) Weight change:  Last BM Date: 09/03/16  Intake/Output from previous day: 03/17 0701 - 03/18 0700 In: 960 [P.O.:960] Out: 1 [Urine:1]  Physical Exam General: NAD    Lungs: CTA w/o W/C. Cardiovascular: RRR, no edema Neurological: No new neurological deficits: dysarthric, L HP  Lab Results: BMET    Component Value Date/Time   NA 135 09/01/2016 0434   K 3.7 09/01/2016 0434   CL 103 09/01/2016 0434   CO2 24 09/01/2016 0434   GLUCOSE 132 (H) 09/01/2016 0434   BUN 19 09/01/2016 0434   CREATININE 1.01 09/01/2016 0434   CALCIUM 8.9 09/01/2016 0434   GFRNONAA >60 09/01/2016 0434   GFRAA >60 09/01/2016 0434   CBC    Component Value Date/Time   WBC 11.4 (H) 09/01/2016 0434   RBC 4.95 09/01/2016 0434   HGB 16.4 09/01/2016 0434   HCT 48.4 09/01/2016 0434   PLT 214 09/01/2016 0434   MCV 97.8 09/01/2016 0434   MCH 33.1 09/01/2016 0434   MCHC 33.9 09/01/2016 0434   RDW 12.1 09/01/2016 0434   LYMPHSABS 3.2 08/26/2016 0602   MONOABS 1.0 08/26/2016 0602   EOSABS 0.1 08/26/2016 0602   BASOSABS 0.0 08/26/2016 0602   CBG's (last 3):    Recent Labs  09/04/16 1641 09/04/16 2037 09/05/16 0646  GLUCAP 133* 146* 130*   LFT's Lab Results  Component Value Date   ALT 79 (H) 08/26/2016   AST 44 (H) 08/26/2016   ALKPHOS 61 08/26/2016   BILITOT 1.4 (H) 08/26/2016    Studies/Results: No results found.  Medications:  I have reviewed the patient's current medications. Scheduled Medications: . aspirin  81 mg Oral Daily  . atorvastatin  40 mg Oral q1800  . bethanechol  25 mg Oral TID  . clopidogrel  75 mg Oral  Daily  . enoxaparin (LOVENOX) injection  40 mg Subcutaneous Q24H  . famotidine  20 mg Oral BID  . feeding supplement (PRO-STAT SUGAR FREE 64)  30 mL Oral BID  . fluticasone  1 spray Each Nare Daily  . insulin aspart  0-20 Units Subcutaneous TID WC & HS  . insulin glargine  45 Units Subcutaneous Daily  . insulin starter kit- pen needles  1 kit Other Once  . lisinopril  20 mg Oral Daily  . metFORMIN  1,000 mg Oral BID WC  . mometasone-formoterol  2 puff Inhalation BID  . nicotine  21 mg Transdermal Daily  . tamsulosin  0.4 mg Oral QPC supper   PRN Medications: acetaminophen, alum & mag hydroxide-simeth, bisacodyl, bisacodyl, diphenhydrAMINE, gabapentin, guaiFENesin-dextromethorphan, polyethylene glycol, prochlorperazine **OR** prochlorperazine **OR** prochlorperazine, senna-docusate, sodium phosphate, traZODone  Assessment/Plan: Active Problems:   Acute right MCA stroke (HCC)   Left hemiplegia (HCC)   Dysarthria, post-stroke   Dysphagia, post-stroke   Occlusion and stenosis of carotid artery   Diabetes mellitus (HCC)   Benign essential HTN   Leukocytosis   Polycythemia vera (HCC)   Tobacco abuse   Chronic obstructive pulmonary disease (HCC)   1. R MCA CVA with  L HP and dysarthria - continue CIR with PT/OT os ongoing - continue  med mgmt of secondary risk factors contributing to CVA and follow up vasc post DC for 75% L ICA occlusion (100% occlusion on R ICA) 2. DM2 with neuropathy - cbgs reasonable - continue Lantus + metformin with SSI 3. Hypertension - blood pressure stable - continue same 4. Tobacco abuse - Smoking cessation instruction/counseling given 5. COPD - no active symptoms today   Length of stay, days: 11   Najir Roop A. Asa Lente, MD 09/05/2016, 9:44 AM

## 2016-09-05 NOTE — Progress Notes (Signed)
Incontinent of urine, patient doesn't call when incontinent. Chad Combs

## 2016-09-05 NOTE — Progress Notes (Addendum)
Occupational Therapy Session Note  Patient Details  Name: Chad Combs MRN: [id number] Date of Birth: 07-Apr-1956  Today's Date: 09/05/2016 OT Individual Time: 1100-1200 OT Individual Time Calculation (min): 60 min   Short Term Goals: Week 2:  OT Short Term Goal 1 (Week 2): Pt will complete stand pivot transfer to the 3:1 with mod assist.  OT Short Term Goal 2 (Week 2): Pt will complete sit to stand for pulling pants over hips with min assist for 2 consecutive sessions.  OT Short Term Goal 3 (Week 2): Pt will complete UB dressing with supervision for 2 consecutive sessions.   OT Short Term Goal 4 (Week 2): Pt will perform self AAROM exercises for the LUE following handout with min instructional cueing.   Skilled Therapeutic Interventions/Progress Updates: ADL-retraining with focus on transfers (slide board and Stedy), sit<>stand, dynamic standing balance, and NMR of R-L/UE.   Pt received supine in bed awaiting therapist and requesting shower.   Pt required vc for technique to roll to his left with min-mod assist to rise to sit at EOB.   After sustained static sitting balance with supervision patient completed right lateral slide board transfer with overall mod assist to place board, shift weight and advance laterally.    Pt then performed sit-stand at [facility] and was placed in shower to bathe.    Pt required overall mod assist to bathe with vc and hand-over-hand assist to incorporate LUE throughout session.    No clean clothing available for this session therefore pt was provided clean gown and non-skid socks and returned to bed in prep for lunch with mod assist for bed mobility.   Pt left with HOB elevated and all needs placed within reach at end of session.     Therapy Documentation Precautions:  Precautions Precautions: Fall Precaution Comments: L sided neglect, shoulder increased risk for subluxation; quick LOB L wihtout awareness Restrictions Weight Bearing Restrictions: No   Vital  Signs: Therapy Vitals BP: 116/81   Pain: Pain Assessment Pain Assessment: No/denies pain   See Function Navigator for Current Functional Status.   Therapy/Group: Individual Therapy  [place] 09/05/2016, 12:13 PM

## 2016-09-06 ENCOUNTER — Encounter (HOSPITAL_COMMUNITY): Payer: Self-pay | Admitting: Psychology

## 2016-09-06 ENCOUNTER — Inpatient Hospital Stay (HOSPITAL_COMMUNITY): Payer: Self-pay | Admitting: Speech Pathology

## 2016-09-06 ENCOUNTER — Inpatient Hospital Stay (HOSPITAL_COMMUNITY): Payer: Self-pay | Admitting: Occupational Therapy

## 2016-09-06 ENCOUNTER — Inpatient Hospital Stay (HOSPITAL_COMMUNITY): Payer: Self-pay

## 2016-09-06 DIAGNOSIS — E0801 Diabetes mellitus due to underlying condition with hyperosmolarity with coma: Secondary | ICD-10-CM

## 2016-09-06 DIAGNOSIS — Z794 Long term (current) use of insulin: Secondary | ICD-10-CM

## 2016-09-06 DIAGNOSIS — F4320 Adjustment disorder, unspecified: Secondary | ICD-10-CM

## 2016-09-06 LAB — GLUCOSE, CAPILLARY
GLUCOSE-CAPILLARY: 135 mg/dL — AB (ref 65–99)
Glucose-Capillary: 207 mg/dL — ABNORMAL HIGH (ref 65–99)
Glucose-Capillary: 105 mg/dL — ABNORMAL HIGH (ref 65–99)
Glucose-Capillary: 144 mg/dL — ABNORMAL HIGH (ref 65–99)

## 2016-09-06 MED ORDER — FAMOTIDINE 20 MG PO TABS
20.0000 mg | ORAL_TABLET | Freq: Two times a day (BID) | ORAL | Status: DC
  Administered 2016-09-06 – 2016-09-18 (×24): 20 mg via ORAL
  Filled 2016-09-06 (×24): qty 1

## 2016-09-06 NOTE — Progress Notes (Signed)
Occupational Therapy Session Note  Patient Details  Name: Chad Combs MRN: [id number] Date of Birth: May 05, 1956  Today's Date: 09/06/2016 OT Individual Time: 0800-0900 OT Individual Time Calculation (min): 60 min    Short Term Goals: Week 2:  OT Short Term Goal 1 (Week 2): Pt will complete stand pivot transfer to the 3:1 with mod assist.  OT Short Term Goal 2 (Week 2): Pt will complete sit to stand for pulling pants over hips with min assist for 2 consecutive sessions.  OT Short Term Goal 3 (Week 2): Pt will complete UB dressing with supervision for 2 consecutive sessions.   OT Short Term Goal 4 (Week 2): Pt will perform self AAROM exercises for the LUE following handout with min instructional cueing.   Skilled Therapeutic Interventions/Progress Updates:    Mr. Galli began session with transfer to the EOB with mod assist.  Michaelyn Barter for transfer to the toilet and to the shower.  He is able to complete sit to stand with min assist from surfaces when using the Stedy.  Increased time with mod questioning cueing to sequence through bathing as he initially just rinsed off with water and then just sat, instead of asking for washcloth and starting bathing.  Max hand over hand assist for initiation and use of the LUE to wash the RUE or wash the left upper leg.  He was able to dry off with mod instructional cueing as well for initiation.  Dressing sit to stand at the sink with max demonstrational cueing and overall mod assist secondary to not being able to follow hemidressing techniques from previous sessions.  Finished grooming tasks with supervision from wheelchair level.  He finished session with call button in reach and safety belt in chair.    Therapy Documentation Precautions:  Precautions Precautions: Fall Precaution Comments: L sided neglect, shoulder increased risk for subluxation; quick LOB L wihtout awareness Restrictions Weight Bearing Restrictions: No  Pain: Pain Assessment Pain  Assessment: No/denies pain ADL: See Function Navigator for Current Functional Status.   Therapy/Group: Individual Therapy  Semisi Biela OTR/L 09/06/2016, 8:59 AM

## 2016-09-06 NOTE — Progress Notes (Signed)
Speech Language Pathology Daily Session Note  Patient Details  Name: Chad Combs MRN: [id number] Date of Birth: [birth year]  Today's Date: 09/06/2016 SLP Individual Time: 0900-1000 SLP Individual Time Calculation (min): 60 min  Short Term Goals: Week 2: SLP Short Term Goal 1 (Week 2): Given Min A verbal cues, pt will utilize external memory aids to recall new daily information.  SLP Short Term Goal 2 (Week 2): Pt will utilize compensatory intelligibility strategies with Min A verbal cues to achieve 75% intelligibility at the sentence level.  SLP Short Term Goal 3 (Week 2): Pt will demonstrate sustained attention in mildly distracting environment for ~30 minutes with Min A verbal cues.  SLP Short Term Goal 4 (Week 2): Pt will complete basic, familiar problem solving tasks with Mod A verbal cues.  SLP Short Term Goal 5 (Week 2): Pt will scan to left of his environment with Mod A verbal cues.  SLP Short Term Goal 6 (Week 2): Pt will consume current diet without overt s/s of aspiration with Min A verbal cues.   Skilled Therapeutic Interventions: Skilled treatment session focused on dysphagia and cognition goals. SLP facilitated session by providing skilled observation of pt consuming trials of regular diet textures. Pt with intermittent explosive coughing. Pt with no evidence of oral residue when consuming but as a result of consistent coughing, pt is not a candidate for upgrade at this time. Pt required Min A verbal cues for sustained attention in mildly distracting environment with supervision cues. Pt required Max A verbal cues to scan to left of his environment during a puzzle task. Pt required Max a verbal cues for problem solving to identify/sort outer edges, corner edges and inner pieces. Pt left upright in bed, bed alarm on and all needs within reach. Continue per current plan of care.      Function:  Eating Eating   Modified Consistency Diet: No Eating Assist Level: Set up assist  for;Supervision or verbal cues   Eating Set Up Assist For: Opening containers Helper Scoops Food on Utensil: Occasionally     Cognition Comprehension Comprehension assist level: Follows basic conversation/direction with no assist  Expression   Expression assist level: Expresses basic needs/ideas: With extra time/assistive device  Social Interaction Social Interaction assist level: Interacts appropriately with others with medication or extra time (anti-anxiety, antidepressant).  Problem Solving Problem solving assist level: Solves basic 50 - 74% of the time/requires cueing 25 - 49% of the time  Memory Memory assist level: Recognizes or recalls 90% of the time/requires cueing < 10% of the time    Pain Pain Assessment Pain Assessment: 0-10 Pain Score: 0-No pain  Therapy/Group: Individual Therapy   Santiago Stenzel B. [name], M.S., CCC-SLP Speech-Language Pathologist   [name] 09/06/2016, 12:13 PM

## 2016-09-06 NOTE — Plan of Care (Signed)
Problem: RH BLADDER ELIMINATION Goal: RH STG MANAGE BLADDER WITH ASSISTANCE STG Manage Bladder With mod Assistance  Outcome: Not Progressing Incontinent of urine

## 2016-09-06 NOTE — Consult Note (Signed)
Neuropsychological Consultation   Patient:   Chad Combs   DOB:   1955-10-31  MR Number:  [id number]  Location:  [facility] [id number] [place] [address] Dept: [place]: [phone]           Date of Service:   09/06/2016  Start Time:   9 AM End Time:   9:55 AM  Provider/Observer:  Ilean Skill, Psy.D.        Billing Code/Service: [id number] 4 Units  Chief Complaint:    Left side weakness, fine motor control, cognitive changes, frustration.  Prior right eye/visual field disorder  Reason for Service:  Patient referred to neuropsychology for consultation due to coping issues following second stroke.  They patient reports that he had his first stroke in 2015 with residual issues with right eye and vision in right eye.  He reports that he recovered a lot form first stroke and was living on his own.  He reports that his son tried to call him and he could not get up.  Remembers being unable to answerer phone and son kept calling and calling.  He reports he knew exactly what was happening and was afraid he was going to die but could not do anything about it.  After ED and all the imaging show acute infarct in right parietal and temporal lobes due to right MCA infarct it was also found that he had blood clots in his left leg.  Put filter in leg due to clots.  The patient has been frustrated with loss of independence and functioning.  Current Status:  The patient has been trying to deal with residual and new cognitive changes due to CVA.  This is his second major stroke that we know of.    Behavioral Observation: Chad Combs  presents as a 61 y.o.-year-old Right Caucasian Male who appeared his stated age. his dress was Appropriate and he was Fairly Groomed and his manners were Appropriate to the situation.  his participation was indicative of Appropriate behaviors.  There were physical  disabilities noted.  he displayed an appropriate level of cooperation and motivation.     Interactions:    Active Appropriate, Attentive and Sharing  Attention:   abnormal and attention span appeared shorter than expected for age  Memory:   abnormal; recent memory intact, remote memory impaired  Visuo-spatial:  abnormal  Issues noted with vision in right eye, but visual spatial appeared impaired.    Speech (Volume):  low  Speech:   normal; normal  Thought Process:  Coherent  Though Content:  WNL; not suicidal  Orientation:   person, place, time/date and situation  Judgment:   Fair  Planning:   Fair  Affect:    Irritable and Lethargic  Mood:    Depressed  Insight:   Good  Intelligence:   normal  Medical History:   Past Medical History:  Diagnosis Date  . Blind right eye   . COPD (chronic obstructive pulmonary disease) ([place])   . Diabetes mellitus without complication ([place])   . Hypertension          Sexual History:   History  Sexual Activity  . Sexual activity: No    Family Med/Psych History:  Family History  Problem Relation Age of Onset  . Cancer Mother   . Heart disease Father     Risk of Suicide/Violence: low No indications of SI or HI  Impression/DX:  This is the second significant stroke for this patient.  First reported to have happened in 2015 with residual issues mainly due to loss of function in right eye.  Patient has now had a second stroke and more significant loss of motor function and changes in cognitive functioning.  Disposition/Plan:  Will meet again to work on more coping and adjustment issues.  May do some neuropsych testing but this may wait until after discharge.          Electronically Signed   _______________________ Ilean Skill, Psy.D.

## 2016-09-06 NOTE — Progress Notes (Addendum)
Physical Therapy Note  Patient Details  Name: [name] MRN: [id number] Date of Birth: [birth year] Today's Date: 09/06/2016  1300-1415, 43 min individual tx Pain: "my hips hurt a lot"- AROM/PROM provided to bil hips; "feels a lot better now"- after neuromuscular re-education adm ROM  Supine neuromuscular re-education via multimodal cues, positioning/assistance for LLE PRN.  10 x 1 each in supine: bil bridging, LLE hip/knee flex>< extneson, bil lower trunk rotation;sustained stretch R/L obliques . In R sidelying, bil hip flex/ext.  R side lying> sit with min assist, max cues.   Squat pivot to L into w/c, min/mod assist.  Max cues and explanation before transfer to slow down, lean forward and aim bottom to w/c.  Dynamic standing activity  Reaching forward for horsehsoes with R hand then placing overhead on target, x 5, x 7; mod> max assist for balance and midline orientation. .  Pt with poor awareness for LLE placement and movement. With therapy dog [place], after passive help for L hand, pt spontaneously used R hand to move L on dog, to pet him.  Scooting L on mat, with bil hands on L knee, focuisng on forward wt shift, slow speed, and head/hips relationship, x 8.  Gait using hallway railing, LAFO, with PT advancing LLE 100% of steps and ensuring L knee stability before pt stepped with R foot.. With max cues, pt scanned for R/L w/c brakes, manipulated them, and moved R foot off of footrest and moved footplate with dorsum of foot. Pt continues to be impulsive, and overpower with RUE, pushing L at times.   Pt left resting in w/c with quick release belt applied and all needs within reach.  See function navigator for current status.     Chad Combs 09/06/2016, 8:25 AM

## 2016-09-07 ENCOUNTER — Ambulatory Visit (HOSPITAL_COMMUNITY): Payer: Self-pay | Admitting: *Deleted

## 2016-09-07 ENCOUNTER — Inpatient Hospital Stay (HOSPITAL_COMMUNITY): Payer: Self-pay | Admitting: Occupational Therapy

## 2016-09-07 ENCOUNTER — Inpatient Hospital Stay (HOSPITAL_COMMUNITY): Payer: Self-pay | Admitting: Speech Pathology

## 2016-09-07 DIAGNOSIS — F4320 Adjustment disorder, unspecified: Secondary | ICD-10-CM

## 2016-09-07 LAB — GLUCOSE, CAPILLARY
GLUCOSE-CAPILLARY: 78 mg/dL (ref 65–99)
Glucose-Capillary: 126 mg/dL — ABNORMAL HIGH (ref 65–99)
Glucose-Capillary: 183 mg/dL — ABNORMAL HIGH (ref 65–99)
Glucose-Capillary: 142 mg/dL — ABNORMAL HIGH (ref 65–99)

## 2016-09-07 NOTE — Progress Notes (Signed)
Subjective/Complaints:  Patient without new issues today. He feels like he is making some progress.  ROS   Denies chest pain, shortness of breath, nausea, vomiting, diarrhea, constipation.  Objective: Vital Signs: Blood pressure 127/76, pulse 94, temperature 98.5 F (36.9 C), temperature source Oral, resp. rate 18, height 5\' 9"  (1.753 m), weight 92.1 kg (203 lb 0.7 oz), SpO2 95 %. No results found. Results for orders placed or performed during the hospital encounter of 08/25/16 (from the past 72 hour(s))  Glucose, capillary     Status: Abnormal   Collection Time: 09/04/16  6:43 AM  Result Value Ref Range   Glucose-Capillary 108 (H) 65 - 99 mg/dL  Glucose, capillary     Status: Abnormal   Collection Time: 09/04/16 11:30 AM  Result Value Ref Range   Glucose-Capillary 197 (H) 65 - 99 mg/dL  Glucose, capillary     Status: Abnormal   Collection Time: 09/04/16  4:41 PM  Result Value Ref Range   Glucose-Capillary 133 (H) 65 - 99 mg/dL  Glucose, capillary     Status: Abnormal   Collection Time: 09/04/16  8:37 PM  Result Value Ref Range   Glucose-Capillary 146 (H) 65 - 99 mg/dL   Comment 1 Notify RN   Glucose, capillary     Status: Abnormal   Collection Time: 09/05/16  6:46 AM  Result Value Ref Range   Glucose-Capillary 130 (H) 65 - 99 mg/dL  Glucose, capillary     Status: Abnormal   Collection Time: 09/05/16 12:14 PM  Result Value Ref Range   Glucose-Capillary 168 (H) 65 - 99 mg/dL  Glucose, capillary     Status: Abnormal   Collection Time: 09/05/16  4:42 PM  Result Value Ref Range   Glucose-Capillary 143 (H) 65 - 99 mg/dL  Glucose, capillary     Status: Abnormal   Collection Time: 09/05/16  8:37 PM  Result Value Ref Range   Glucose-Capillary 143 (H) 65 - 99 mg/dL   Comment 1 Notify RN   Glucose, capillary     Status: Abnormal   Collection Time: 09/06/16  6:36 AM  Result Value Ref Range   Glucose-Capillary 135 (H) 65 - 99 mg/dL  Glucose, capillary     Status: Abnormal   Collection Time: 09/06/16 11:22 AM  Result Value Ref Range   Glucose-Capillary 207 (H) 65 - 99 mg/dL   Comment 1 Notify RN   Glucose, capillary     Status: Abnormal   Collection Time: 09/06/16  4:27 PM  Result Value Ref Range   Glucose-Capillary 105 (H) 65 - 99 mg/dL   Comment 1 Notify RN   Glucose, capillary     Status: Abnormal   Collection Time: 09/06/16 10:23 PM  Result Value Ref Range   Glucose-Capillary 144 (H) 65 - 99 mg/dL     HEENT: normal Cardio: RRR and No murmur Resp: CTA B/L and Unlabored GI: BS positive and Nontender, nondistended Extremity:  No Edema Skin:   Intact Neuro: Lethargic, Flat, Abnormal Sensory Reduced sensation to pinch in left upper extremity greater than left lower extremity. Intact sensation on the right, Abnormal Motor Motor is 5/5 in the right deltoid, biceps, triceps, grip, hip flexor, knee extensor, ankle dorsiflexor, trace left biceps and finger flexors, 2 minus, left hip, knee extensor synergy, 0 at the ankle, Abnormal FMC Ataxic/ dec FMC and Dysarthric Musc/Skel:  Other No pain with upper extremity or lower extremity range of motion Gen. no acute distress   Assessment/Plan: 1. Functional deficits secondary  to Right MCA infarct with Left Hemiparesis which require 3+ hours per day of interdisciplinary therapy in a comprehensive inpatient rehab setting. Physiatrist is providing close team supervision and 24 hour management of active medical problems listed below. Physiatrist and rehab team continue to assess barriers to discharge/monitor patient progress toward functional and medical goals. FIM: Function - Bathing Position: Shower Body parts bathed by patient: Left arm, Chest, Abdomen, Front perineal area, Right upper leg, Left upper leg, Right lower leg Body parts bathed by helper: Right arm, Buttocks, Left lower leg, Back Assist Level: Touching or steadying assistance(Pt > 75%)  Function- Upper Body Dressing/Undressing What is the patient  wearing?: Pull over shirt/dress Pull over shirt/dress - Perfomed by patient: Thread/unthread right sleeve, Put head through opening Pull over shirt/dress - Perfomed by helper: Thread/unthread left sleeve, Pull shirt over trunk Assist Level: Touching or steadying assistance(Pt > 75%) Function - Lower Body Dressing/Undressing What is the patient wearing?: Non-skid slipper socks, Pants, Shoes Position: Wheelchair/chair at sink Underwear - Performed by helper: Thread/unthread right underwear leg, Thread/unthread left underwear leg, Pull underwear up/down Pants- Performed by patient: Thread/unthread right pants leg Pants- Performed by helper: Thread/unthread left pants leg, Pull pants up/down Non-skid slipper socks- Performed by patient: Don/doff right sock Non-skid slipper socks- Performed by helper: Don/doff right sock Socks - Performed by patient: Don/doff left sock Shoes - Performed by patient: Don/doff right shoe Shoes - Performed by helper: Don/doff left shoe, Fasten right, Fasten left Assist for footwear: Dependant Assist for lower body dressing:  (Max assist)  Function - Toileting Toileting activity did not occur: No continent bowel/bladder event Toileting steps completed by helper: Adjust clothing prior to toileting, Performs perineal hygiene, Adjust clothing after toileting Toileting Assistive Devices: Other (comment) (stedy) Assist level: Two helpers  Function - Air cabin crew transfer activity did not occur:  (using urinal or incont., no BM) Toilet transfer assistive device: Elevated toilet seat/BSC over toilet Mechanical lift: Stedy Assist level to toilet: Total assist (Pt < 25%) Assist level from toilet: Total assist (Pt < 25%)  Function - Chair/bed transfer Chair/bed transfer method: Squat pivot Chair/bed transfer assist level: Moderate assist (Pt 50 - 74%/lift or lower) Chair/bed transfer assistive device: Armrests Mechanical lift: Stedy Chair/bed transfer  details: Manual facilitation for weight shifting, Verbal cues for precautions/safety, Verbal cues for technique, Manual facilitation for placement  Function - Locomotion: Wheelchair Will patient use wheelchair at discharge?: Yes Type: Manual Max wheelchair distance: 177ft Assist Level: Supervision or verbal cues Assist Level: Supervision or verbal cues Assist Level: Supervision or verbal cues Turns around,maneuvers to table,bed, and toilet,negotiates 3% grade,maneuvers on rugs and over doorsills: No Function - Locomotion: Ambulation Ambulation activity did not occur: Safety/medical concerns (pt pushes toward hemi side; unsafe) Assistive device: Rail in hallway, Orthosis Max distance: 10 Assist level: 2 helpers Walk 10 feet activity did not occur: Safety/medical concerns Assist level: 2 helpers Walk 50 feet with 2 turns activity did not occur: Safety/medical concerns Walk 150 feet activity did not occur: Safety/medical concerns Walk 10 feet on uneven surfaces activity did not occur: Safety/medical concerns  Function - Comprehension Comprehension: Auditory Comprehension assist level: Follows basic conversation/direction with no assist  Function - Expression Expression: Verbal Expression assist level: Expresses complex ideas: With extra time/assistive device  Function - Social Interaction Social Interaction assist level: Interacts appropriately with others with medication or extra time (anti-anxiety, antidepressant).  Function - Problem Solving Problem solving assist level: Solves basic 50 - 74% of the time/requires cueing 25 -  49% of the time  Function - Memory Memory assist level: Recognizes or recalls 90% of the time/requires cueing < 10% of the time Patient normally able to recall (first 3 days only): That he or she is in a hospital, Staff names and faces, Current season, Location of own room  Medical Problem List and Plan: 1.  Left hemiplegia, limitations with self-care,  difficulty with transfers secondary to right MCA CVA. Cont CIR PT, OT, speech. Team conf in am 2.  DVT Prophylaxis/Anticoagulation: Pharmaceutical: Lovenox 3. Pain Management: N/A 4. Mood: Team to provide ego support. Expressing anxiety about current situation. Has supportive family. LCSW to follow for evaluation and support.  5. Neuropsych: This patient is capable of making decisions on his own behalf. 6. Skin/Wound Care: routine pressure relief measures.  7. Fluids/Electrolytes/Nutrition: Monitor I/O. Check lytes in am. Encourage fluid intake.  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 9. T2DM: Hgb A1C- 10.5 --. Continue metformin 1000mg  bid. Was started on lantus adjust per am CBG,increase to 45 units daily at bedtime, with SSI for elevated BS--will consult RD for diabetic education and start education on insulin administration.  Will monitor BS ac/hs, goal 115 to 180 CBG (last 3)   Recent Labs  09/06/16 1122 09/06/16 1627 09/06/16 2223  GLUCAP 207* 105* 144*    10. HTN: Monitor BP bid--on lisinopril 20mg  daily.stabilizing in normal range Vitals:   09/06/16 1210 09/07/16 0519  BP: 138/70 127/76  Pulse: 91 94  Resp: 17 18  Temp: 97.6 F (36.4 C) 98.5 F (36.9 C)   11. Leucocytosis: Monitor for signs of infection. UA negative. Afebrile,WBC 11.4 12. Polycythemia: Smokes 1.5 PPD. Expect this to remain elevated during hospitalization, On NicoDerm patch 13. COPD: SOB reported--CXR negative. Encourage tobacco cessation.  Managed without medications at this time.   14. Tobacco abuse: Counsel  14. Hypoalbuminemia, start pro-stat 15. Urinary retention,   BPH, trial of Flomax,  Urecholine 25mg  TID, no ICP reported since 3/13, PVR 20-215ml LOS (Days) 13 A FACE TO FACE EVALUATION WAS PERFORMED  Gordie Belvin E 09/07/2016, 6:19 AM

## 2016-09-07 NOTE — Progress Notes (Signed)
Occupational Therapy Session Note  Patient Details  Name: Chad Combs MRN: [id number] Date of Birth: 1956-04-07  Today's Date: 09/07/2016 OT Individual Time: 1052-1203 OT Individual Time Calculation (min): 71 min    Short Term Goals: Week 2:  OT Short Term Goal 1 (Week 2): Pt will complete stand pivot transfer to the 3:1 with mod assist.  OT Short Term Goal 2 (Week 2): Pt will complete sit to stand for pulling pants over hips with min assist for 2 consecutive sessions.  OT Short Term Goal 3 (Week 2): Pt will complete UB dressing with supervision for 2 consecutive sessions.   OT Short Term Goal 4 (Week 2): Pt will perform self AAROM exercises for the LUE following handout with min instructional cueing.   Skilled Therapeutic Interventions/Progress Updates:    Pt completed transfers from the wheelchair to the [facility] with mod assist squat pivot.  Worked on midline trunk orientation with left trunk active shortening and LUE active elbow extension.  Pt able to elicit some active left elbow extension with LUE in weightbearing on the mat while reaching for pegs with the RUE.  Mod facilitation needed to maintain left elbow extension when reaching beyond base of support to the left to retrieve pegs and then place at midline to create design consistent with picture diagram given.  He needed mod questioning cueing to notice errors on his grid compared to the picture.  Next had pt complete active movement in the left shoulder with use of a tilted stool.  He was able to initiate and complete small movements of shoulder flexion and elbow extension, with min facilitation at the trunk to avoid compensatory strategies.  Mod assist for squat pivot transfers to the wheelchair and back to the bed to complete session.  Pt left with LUE supported on pillows and call button in reach.  Bed alarm in place as well.   Therapy Documentation Precautions:  Precautions Precautions: Fall Precaution Comments: L sided neglect,  shoulder increased risk for subluxation; quick LOB L wihtout awareness Restrictions Weight Bearing Restrictions: No  Pain: Pain Assessment Faces Pain Scale: Hurts a little bit Pain Type: Acute pain Pain Location: Leg Pain Orientation: Right;Left Pain Descriptors / Indicators: Aching Pain Intervention(s): Medication (See eMAR) ADL: See Function Navigator for Current Functional Status.   Therapy/Group: Individual Therapy  Cyndra Feinberg OTR/L 09/07/2016, 4:18 PM

## 2016-09-07 NOTE — Progress Notes (Signed)
Physical Therapy Note  Patient Details  Name: Chad Combs MRN: [id number] Date of Birth: 1956-03-01 Today's Date: 09/07/2016    Time: 830-926 56 minutes  1:1 No c/o pain.  Pt performed bed mobility with min A for balance and assist for Lt LE.  Squat pivot transfers throughout session both directions performed with min A, cues for safety, control and slowing down.  Standing balance with wt shifts, reaching, tapping toes on Rt LE and mini squats for forced wt bearing on Lt with max manual facilitation for balance and wt shifts.  Nu step per pt request x 8 minutes level 3 with UEs and LEs for continued strengthening.  Pt performed w/c mobility to and from gym with supervision, hemi technique, cues for posture.   DONAWERTH,KAREN 09/07/2016, 9:28 AM

## 2016-09-07 NOTE — Progress Notes (Signed)
Speech Language Pathology Daily Session Note  Patient Details  Name: Chad Combs MRN: [id number] Date of Birth: Oct 20, 1955  Today's Date: 09/07/2016 SLP Individual Time: 6073-7106 SLP Individual Time Calculation (min): 60 min  Short Term Goals: Week 2: SLP Short Term Goal 1 (Week 2): Given Min A verbal cues, pt will utilize external memory aids to recall new daily information.  SLP Short Term Goal 2 (Week 2): Pt will utilize compensatory intelligibility strategies with Min A verbal cues to achieve 75% intelligibility at the sentence level.  SLP Short Term Goal 3 (Week 2): Pt will demonstrate sustained attention in mildly distracting environment for ~30 minutes with Min A verbal cues.  SLP Short Term Goal 4 (Week 2): Pt will complete basic, familiar problem solving tasks with Mod A verbal cues.  SLP Short Term Goal 5 (Week 2): Pt will scan to left of his environment with Mod A verbal cues.  SLP Short Term Goal 6 (Week 2): Pt will consume current diet without overt s/s of aspiration with Min A verbal cues.   Skilled Therapeutic Interventions: Skilled treatment session focused on cognition goals. SLP facilitated session by providing Mod A to Min A for completion of basic, familiar problem solving tasks. Pt able ot scan to the left of his environment with Min A verbal cues. Pt was intelligible at the sentence level with >90% and Mod I. Pt required Mod A faded to Min A verbal cues for use of external memory aids to recall new daily information. Pt was left upright in bed, bed alarm on and all needs within reach. Continue per current plan of care.      Function:  Eating Eating     Eating Assist Level: Set up assist for;Supervision or verbal cues   Eating Set Up Assist For: Opening containers       Cognition Comprehension Comprehension assist level: Follows basic conversation/direction with no assist  Expression   Expression assist level: Expresses basic needs/ideas: With no assist   Social Interaction Social Interaction assist level: Interacts appropriately with others with medication or extra time (anti-anxiety, antidepressant).  Problem Solving Problem solving assist level: Solves basic 50 - 74% of the time/requires cueing 25 - 49% of the time  Memory Memory assist level: Recognizes or recalls 90% of the time/requires cueing < 10% of the time    Pain Pain Assessment Faces Pain Scale: Hurts a little bit Pain Type: Acute pain Pain Location: Leg Pain Orientation: Right;Left Pain Descriptors / Indicators: Aching Pain Intervention(s): Medication (See eMAR)  Therapy/Group: Individual Therapy   Unknown Flannigan B. [name], M.S., CCC-SLP Speech-Language Pathologist   Bush Murdoch 09/07/2016, 3:25 PM

## 2016-09-08 ENCOUNTER — Inpatient Hospital Stay (HOSPITAL_COMMUNITY): Payer: Self-pay | Admitting: Physical Therapy

## 2016-09-08 ENCOUNTER — Inpatient Hospital Stay (HOSPITAL_COMMUNITY): Payer: Self-pay

## 2016-09-08 ENCOUNTER — Inpatient Hospital Stay (HOSPITAL_COMMUNITY): Payer: Self-pay | Admitting: Occupational Therapy

## 2016-09-08 ENCOUNTER — Inpatient Hospital Stay (HOSPITAL_COMMUNITY): Payer: Self-pay | Admitting: Speech Pathology

## 2016-09-08 LAB — BASIC METABOLIC PANEL
ANION GAP: 13 (ref 5–15)
BUN: 18 mg/dL (ref 6–20)
CALCIUM: 9.8 mg/dL (ref 8.9–10.3)
CO2: 24 mmol/L (ref 22–32)
CREATININE: 1.07 mg/dL (ref 0.61–1.24)
Chloride: 101 mmol/L (ref 101–111)
Glucose, Bld: 214 mg/dL — ABNORMAL HIGH (ref 65–99)
Potassium: 3.8 mmol/L (ref 3.5–5.1)
SODIUM: 138 mmol/L (ref 135–145)

## 2016-09-08 LAB — CBC
HCT: 52.5 % — ABNORMAL HIGH (ref 39.0–52.0)
Hemoglobin: 17.9 g/dL — ABNORMAL HIGH (ref 13.0–17.0)
MCH: 33.4 pg (ref 26.0–34.0)
MCHC: 34.1 g/dL (ref 30.0–36.0)
MCV: 97.9 fL (ref 78.0–100.0)
PLATELETS: 275 10*3/uL (ref 150–400)
RBC: 5.36 MIL/uL (ref 4.22–5.81)
RDW: 12.1 % (ref 11.5–15.5)
WBC: 12.9 10*3/uL — AB (ref 4.0–10.5)

## 2016-09-08 LAB — GLUCOSE, CAPILLARY
GLUCOSE-CAPILLARY: 144 mg/dL — AB (ref 65–99)
Glucose-Capillary: 129 mg/dL — ABNORMAL HIGH (ref 65–99)
Glucose-Capillary: 188 mg/dL — ABNORMAL HIGH (ref 65–99)
Glucose-Capillary: 144 mg/dL — ABNORMAL HIGH (ref 65–99)

## 2016-09-08 MED ORDER — GABAPENTIN 100 MG PO CAPS
100.0000 mg | ORAL_CAPSULE | Freq: Every day | ORAL | Status: DC
  Administered 2016-09-08 – 2016-09-27 (×20): 100 mg via ORAL
  Filled 2016-09-08 (×20): qty 1

## 2016-09-08 NOTE — Progress Notes (Signed)
Occupational Therapy Session Note  Patient Details  Name: Chad Combs MRN: [id number] Date of Birth: [birth year]  Today's Date: 09/08/2016 OT Individual Time: 7948-0165 OT Individual Time Calculation (min): 61 min    Short Term Goals: Week 2:  OT Short Term Goal 1 (Week 2): Pt will complete stand pivot transfer to the 3:1 with mod assist.  OT Short Term Goal 2 (Week 2): Pt will complete sit to stand for pulling pants over hips with min assist for 2 consecutive sessions.  OT Short Term Goal 3 (Week 2): Pt will complete UB dressing with supervision for 2 consecutive sessions.   OT Short Term Goal 4 (Week 2): Pt will perform self AAROM exercises for the LUE following handout with min instructional cueing.   Skilled Therapeutic Interventions/Progress Updates:    Pt completed bathing and dressing during session.  Max assist needed for stand pivot transfer from the wheelchair to the tub bench.  Decreased left hip and knee control during transfer requiring max assist to stabilize.  Completed bathing sit to stand with overall mod assist.  He continues to needed mod instructional cueing for thoroughness as well as mod assist for crossing the LLE over the right knee and maintain it for washing of the right foot.  Pt impulsive with sit to stand when attempting to wash his bottom.  Needed cueing to wait for therapist as he attempted to stand without therapist in position to provide support on the left side.  Min assist for balance once standing while completing washing with the RUE.  Dressing sit to stand at the sink with mod instructional cueing for hemi techniques and for orientation of clothing.  Pt still demonstrating difficulty with this and needed at least 3 attempts to finally place the RLE in the correct pants leg.  Finished session with pt in wheelchair working on shaving with Copy.  Safety belt in place and NT in room to supervise as well.    Therapy Documentation Precautions:   Precautions Precautions: Fall Precaution Comments: L sided neglect, shoulder increased risk for subluxation; quick LOB L wihtout awareness Restrictions Weight Bearing Restrictions: No  Pain:  No report of pain during session  ADL: See Function Navigator for Current Functional Status.   Therapy/Group: Individual Therapy  Marenda Accardi OTR/L 09/08/2016, 12:30 PM

## 2016-09-08 NOTE — Patient Care Conference (Signed)
Inpatient RehabilitationTeam Conference and Plan of Care Update Date: 09/08/2016   Time: 10:55 AM    Patient Name: Chad Combs      Medical Record Number: [id number]  Date of Birth: 1955/10/15 Sex: Male         Room/Bed: 4W11C/4W11C-01 Payor Info: Payor: MEDICAID POTENTIAL / Plan: MEDICAID POTENTIAL / Product Type: *No Product type* /    Admitting Diagnosis: R CVA  Admit Date/Time:  08/25/2016  2:36 PM Admission Comments: No comment available   Primary Diagnosis:  <principal problem not specified> Principal Problem: <principal problem not specified>  Patient Active Problem List   Diagnosis Date Noted  . Adjustment disorder   . Acute right MCA stroke ([employer]) 08/25/2016  . Left hemiplegia ([place])   . Dysarthria, post-stroke   . Dysphagia, post-stroke   . Occlusion and stenosis of carotid artery   . Diabetes mellitus ([place])   . Benign essential HTN   . Leukocytosis   . Polycythemia vera ([place])   . Tobacco abuse   . Chronic obstructive pulmonary disease ([place])   . CVA (cerebral vascular accident) ([place]) 08/21/2016  . Hypertension 07/04/2015  . Diabetes mellitus without complication ([place]) 63/78/5885  . 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 COPD (chronic obstructive pulmonary disease) ([place]) 07/04/2015    Expected Discharge Date: Expected Discharge Date: 09/21/16  Team Members Present: Physician leading conference: Ilean Skill, PsyD;Dr. Alysia Penna Social Worker Present: Ovidio Kin, LCSW Nurse Present: Heather Roberts, RN PT Present: Georjean Mode, Rory Percy, PT OT Present: Clyda Greener, OT SLP Present: Stormy Fabian, SLP PPS Coordinator present : Daiva Nakayama, RN, CRRN     Current Status/Progress Goal Weekly Team Focus  Medical   leg pain at night, leaning to left, poor knee control  improve continence, improve left inattention  reduce impulsivity and left lean   Bowel/Bladder   continent B/B urinary incontinence at times. LBM 09/07/16  min assist  monitor B/B q shift    Swallow/Nutrition/ Hydration   dysphagia 3 with thin liquids, full nursing supervision - supervision for complete oral clearing  Supervision with least restrictive diet  use of compensatory swallow strategies, lingual sweep for left pocketing   ADL's   Min assist for UB bathing and dressing, mod assist for LB bathing sit to stand with max assist for LB dressing following hemi techniques. Mod assist for squat pivot transfers to the wheelchair with max assist for stand pivot transfers.  Brunnstrum stage II in the left arm and stage I in the hand, with trace shoulder and elbow movements noted.    min assist to supervision  neuromuscular re-education, selfcare retraining, balance retraining, LUE therapeutic exercise, pt/family education   Mobility   min A squat pivot transfers, supervision w/c mobility hemi technique, +2 assist gait at railing  min assist basic and car transfer, supervsion w/c x 150' controlled and 50' home, min assist gait x 50'  NMR, midline orientation, gait training   Communication   Min A for ~90% speech intelligibility at the sentence level  Supervision  Use of speech intelligiblity strategies at the sentence level for over-articulation   Safety/Cognition/ Behavioral Observations  Mod A for basic problem solving, left inattention, intellectual awareness  Supervision  completion of basic problem solving tasks, strategies for left inattention and identifying deficits   Pain   C/O  restless leg pain/ prn neurontin 100mg   pain less than or equal to 2  monitor pain q shift   Skin   no skin breakdown   no  skin breakdown this admission  monitor skin q shift    Rehab Goals Patient on target to meet rehab goals: Yes *See Care Plan and progress notes for long and short-term goals.  Barriers to Discharge: heavy asssit level, bladder continence    Possible Resolutions to Barriers:  Cont rehab    Discharge Planning/Teaching Needs:  Son is working on trying to get care once his  brother and aunt are gone after three weeks. Son hopes will do better while here.      Team Discussion:  Goals-min-supervision level, currently min-mod. Left inattention and impulsivity. Trials of AFO for L-knee and hip stability. Needs to slow everything down. Diabetes under good control now. Fatigues easily. Sleeps better with trazodone. Neuro-psych saw and will continue to see while here. Making progress slowly-numerous deficits form both strokes. Will need 24 hr care-even at wheelchair level.  Revisions to Treatment Plan:  DC 4/4   Continued Need for Acute Rehabilitation Level of Care: The patient requires daily medical management by a physician with specialized training in physical medicine and rehabilitation for the following conditions: Daily direction of a multidisciplinary physical rehabilitation program to ensure safe treatment while eliciting the highest outcome that is of practical value to the patient.: Yes Daily medical management of patient stability for increased activity during participation in an intensive rehabilitation regime.: Yes Daily analysis of laboratory values and/or radiology reports with any subsequent need for medication adjustment of medical intervention for : Diabetes problems;Urological problems;Neurological problems  Elease Hashimoto 09/08/2016, 12:40 PM

## 2016-09-08 NOTE — Progress Notes (Signed)
Speech Language Pathology Daily Session Note  Patient Details  Name: Chad Combs MRN: [id number] Date of Birth: September 22, 1955  Today's Date: 09/08/2016 SLP Individual Time: 1300-1330 SLP Individual Time Calculation (min): 30 min  Short Term Goals: Week 2: SLP Short Term Goal 1 (Week 2): Given Min A verbal cues, pt will utilize external memory aids to recall new daily information.  SLP Short Term Goal 2 (Week 2): Pt will utilize compensatory intelligibility strategies with Min A verbal cues to achieve 75% intelligibility at the sentence level.  SLP Short Term Goal 3 (Week 2): Pt will demonstrate sustained attention in mildly distracting environment for ~30 minutes with Min A verbal cues.  SLP Short Term Goal 4 (Week 2): Pt will complete basic, familiar problem solving tasks with Mod A verbal cues.  SLP Short Term Goal 5 (Week 2): Pt will scan to left of his environment with Mod A verbal cues.  SLP Short Term Goal 6 (Week 2): Pt will consume current diet without overt s/s of aspiration with Min A verbal cues.   Skilled Therapeutic Interventions: Skilled treatment session focused on cognition goals. SLP facilitated session by providing Min A verbal cues for completion of novel card game. Pt able to sustain attention in mildly distracting environment for ~30 minutes with Min A to supervision cues. Pt required Mod A multimodal cues for attention to cards on his left. Pt was returned to room, left upright in wheelchair, safety belt donned and all needs within reach. Continue per current plan of care.      Function:  Eating Eating   Modified Consistency Diet: Yes Eating Assist Level: Set up assist for;Supervision or verbal cues   Eating Set Up Assist For: Opening containers       Cognition Comprehension Comprehension assist level: Understands basic 90% of the time/cues < 10% of the time  Expression   Expression assist level: Expresses basic needs/ideas: With no assist  Social Interaction  Social Interaction assist level: Interacts appropriately 75 - 89% of the time - Needs redirection for appropriate language or to initiate interaction.  Problem Solving Problem solving assist level: Solves basic 75 - 89% of the time/requires cueing 10 - 24% of the time  Memory Memory assist level: Recognizes or recalls 75 - 89% of the time/requires cueing 10 - 24% of the time    Pain    Therapy/Group: Individual Therapy  Magie Ciampa B. [name], M.S., CCC-SLP Speech-Language Pathologist  Sherika Kubicki 09/08/2016, 2:53 PM

## 2016-09-08 NOTE — Progress Notes (Signed)
Subjective/Complaints:  Starting breakfast, drinking thin liquids without coughing. Occasional left shoulder pain but not severe.  ROS   Denies chest pain, shortness of breath, nausea, vomiting, diarrhea, constipation.  Objective: Vital Signs: Blood pressure 120/60, pulse 96, temperature 97.2 F (36.2 C), temperature source Oral, resp. rate 18, height 5' 9"  (1.753 m), weight 92.1 kg (203 lb 0.7 oz), SpO2 97 %. No results found. Results for orders placed or performed during the hospital encounter of 08/25/16 (from the past 72 hour(s))  Glucose, capillary     Status: Abnormal   Collection Time: 09/05/16 12:14 PM  Result Value Ref Range   Glucose-Capillary 168 (H) 65 - 99 mg/dL  Glucose, capillary     Status: Abnormal   Collection Time: 09/05/16  4:42 PM  Result Value Ref Range   Glucose-Capillary 143 (H) 65 - 99 mg/dL  Glucose, capillary     Status: Abnormal   Collection Time: 09/05/16  8:37 PM  Result Value Ref Range   Glucose-Capillary 143 (H) 65 - 99 mg/dL   Comment 1 Notify RN   Glucose, capillary     Status: Abnormal   Collection Time: 09/06/16  6:36 AM  Result Value Ref Range   Glucose-Capillary 135 (H) 65 - 99 mg/dL  Glucose, capillary     Status: Abnormal   Collection Time: 09/06/16 11:22 AM  Result Value Ref Range   Glucose-Capillary 207 (H) 65 - 99 mg/dL   Comment 1 Notify RN   Glucose, capillary     Status: Abnormal   Collection Time: 09/06/16  4:27 PM  Result Value Ref Range   Glucose-Capillary 105 (H) 65 - 99 mg/dL   Comment 1 Notify RN   Glucose, capillary     Status: Abnormal   Collection Time: 09/06/16 10:23 PM  Result Value Ref Range   Glucose-Capillary 144 (H) 65 - 99 mg/dL  Glucose, capillary     Status: Abnormal   Collection Time: 09/07/16  6:47 AM  Result Value Ref Range   Glucose-Capillary 126 (H) 65 - 99 mg/dL  Glucose, capillary     Status: Abnormal   Collection Time: 09/07/16 11:58 AM  Result Value Ref Range   Glucose-Capillary 183 (H) 65 -  99 mg/dL  Glucose, capillary     Status: None   Collection Time: 09/07/16  4:35 PM  Result Value Ref Range   Glucose-Capillary 78 65 - 99 mg/dL  Glucose, capillary     Status: Abnormal   Collection Time: 09/07/16  9:05 PM  Result Value Ref Range   Glucose-Capillary 142 (H) 65 - 99 mg/dL  Glucose, capillary     Status: Abnormal   Collection Time: 09/08/16  6:52 AM  Result Value Ref Range   Glucose-Capillary 129 (H) 65 - 99 mg/dL     HEENT: normal Cardio: RRR and No murmur Resp: CTA B/L and Unlabored GI: BS positive and Nontender, nondistended Extremity:  No Edema Skin:   Intact Neuro: Lethargic, Flat, Abnormal Sensory Reduced sensation to pinch in left upper extremity greater than left lower extremity. Intact sensation on the right, Abnormal Motor Motor is 5/5 in the right deltoid, biceps, triceps, grip, hip flexor, knee extensor, ankle dorsiflexor, trace left biceps and finger flexors, 2 minus, left hip, knee extensor synergy, 0 at the ankle, Abnormal FMC Ataxic/ dec FMC and Dysarthric Musc/Skel:  Other No pain with upper extremity or lower extremity range of motion Gen. no acute distress   Assessment/Plan: 1. Functional deficits secondary to Right MCA infarct with Left  Hemiparesis which require 3+ hours per day of interdisciplinary therapy in a comprehensive inpatient rehab setting. Physiatrist is providing close team supervision and 24 hour management of active medical problems listed below. Physiatrist and rehab team continue to assess barriers to discharge/monitor patient progress toward functional and medical goals. FIM: Function - Bathing Position: Shower Body parts bathed by patient: Left arm, Chest, Abdomen, Front perineal area, Right upper leg, Left upper leg, Right lower leg Body parts bathed by helper: Right arm, Buttocks, Left lower leg, Back Assist Level: Touching or steadying assistance(Pt > 75%)  Function- Upper Body Dressing/Undressing What is the patient  wearing?: Pull over shirt/dress Pull over shirt/dress - Perfomed by patient: Thread/unthread right sleeve, Put head through opening Pull over shirt/dress - Perfomed by helper: Thread/unthread left sleeve, Pull shirt over trunk Assist Level: Touching or steadying assistance(Pt > 75%) Function - Lower Body Dressing/Undressing What is the patient wearing?: Non-skid slipper socks, Pants, Shoes Position: Wheelchair/chair at sink Underwear - Performed by helper: Thread/unthread right underwear leg, Thread/unthread left underwear leg, Pull underwear up/down Pants- Performed by patient: Thread/unthread right pants leg Pants- Performed by helper: Thread/unthread left pants leg, Pull pants up/down Non-skid slipper socks- Performed by patient: Don/doff right sock Non-skid slipper socks- Performed by helper: Don/doff right sock Socks - Performed by patient: Don/doff left sock Shoes - Performed by patient: Don/doff right shoe Shoes - Performed by helper: Don/doff left shoe, Fasten right, Fasten left Assist for footwear: Dependant Assist for lower body dressing:  (Max assist)  Function - Toileting Toileting activity did not occur: No continent bowel/bladder event Toileting steps completed by helper: Adjust clothing prior to toileting, Performs perineal hygiene, Adjust clothing after toileting Toileting Assistive Devices:  (steady) Assist level: Touching or steadying assistance (Pt.75%) (steady)  Function - Air cabin crew transfer activity did not occur:  (using urinal or incont., no BM) Toilet transfer assistive device: Elevated toilet seat/BSC over toilet Mechanical lift: Stedy Assist level to toilet: Total assist (Pt < 25%) Assist level from toilet: Total assist (Pt < 25%)  Function - Chair/bed transfer Chair/bed transfer method: Squat pivot Chair/bed transfer assist level: Touching or steadying assistance (Pt > 75%) Chair/bed transfer assistive device: Environmental manager lift:  Stedy Chair/bed transfer details: Manual facilitation for weight shifting, Verbal cues for precautions/safety, Verbal cues for technique, Manual facilitation for placement  Function - Locomotion: Wheelchair Will patient use wheelchair at discharge?: Yes Type: Manual Max wheelchair distance: 230f  Assist Level: Supervision or verbal cues Assist Level: Supervision or verbal cues Assist Level: Supervision or verbal cues Turns around,maneuvers to table,bed, and toilet,negotiates 3% grade,maneuvers on rugs and over doorsills: No Function - Locomotion: Ambulation Ambulation activity did not occur: Safety/medical concerns (pt pushes toward hemi side; unsafe) Assistive device: Rail in hallway, Orthosis Max distance: 10 Assist level: 2 helpers Walk 10 feet activity did not occur: Safety/medical concerns Assist level: 2 helpers Walk 50 feet with 2 turns activity did not occur: Safety/medical concerns Walk 150 feet activity did not occur: Safety/medical concerns Walk 10 feet on uneven surfaces activity did not occur: Safety/medical concerns  Function - Comprehension Comprehension: Auditory Comprehension assist level: Follows complex conversation/direction with no assist  Function - Expression Expression: Verbal Expression assist level: Expresses complex ideas: With no assist  Function - Social Interaction Social Interaction assist level: Interacts appropriately with others with medication or extra time (anti-anxiety, antidepressant).  Function - Problem Solving Problem solving assist level: Solves basic 50 - 74% of the time/requires cueing 25 - 49% of the time  Function - Memory Memory assist level: Recognizes or recalls 75 - 89% of the time/requires cueing 10 - 24% of the time Patient normally able to recall (first 3 days only): That he or she is in a hospital  Medical Problem List and Plan: 1.  Left hemiplegia, limitations with self-care, difficulty with transfers secondary to right  MCA CVA. Cont CIR PT, OT, speech. Team conference today please see physician documentation under team conference tab, met with team face-to-face to discuss problems,progress, and goals. Formulized individual treatment plan based on medical history, underlying problem and comorbidities. 2.  DVT Prophylaxis/Anticoagulation: Pharmaceutical: Lovenox 3. Pain Management: N/A 4. Mood: Team to provide ego support. Expressing anxiety about current situation. Has supportive family. LCSW to follow for evaluation and support.  5. Neuropsych: This patient is capable of making decisions on his own behalf. 6. Skin/Wound Care: routine pressure relief measures.  7. Fluids/Electrolytes/Nutrition: Monitor I/O. Check lytes in am. Encourage fluid intake.  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 9. T2DM: Hgb A1C- 10.5 --. Continue metformin 1058m bid. Was started on lantus adjust per am CBG,increase to 45 units daily at bedtime, with SSI for elevated BS--will consult RD for diabetic education and start education on insulin administration.  Will monitor BS ac/hs, goal 115 to 180, Improved CBG (last 3)   Recent Labs  09/07/16 1635 09/07/16 2105 09/08/16 0652  GLUCAP 78 142* 129*    10. HTN: Monitor BP bid--on lisinopril 236mdaily.stabilizing in normal range Vitals:   09/07/16 1243 09/08/16 0533  BP: 116/66 120/60  Pulse: 93 96  Resp: 17 18  Temp: 98.8 F (37.1 C) 97.2 F (36.2 C)   11. Leucocytosis: Monitor for signs of infection. UA negative. Afebrile,WBC 11.4 12. Polycythemia: Smokes 1.5 PPD. Expect this to remain elevated during hospitalization, On NicoDerm patch 13. COPD: SOB reported--CXR negative. Encourage tobacco cessation.  Managed without medications at this time.   14. Tobacco abuse: Counsel  14. Hypoalbuminemia, start pro-stat 15. Urinary retention,   BPH, trial of Flomax,  Urecholine 2536mID,improved, will wean urecholine  dose LOS (Days) 14 A FACE TO FACE EVALUATION WAS PERFORMED  Charlotte Fidalgo E 09/08/2016, 9:27 AM

## 2016-09-08 NOTE — Progress Notes (Signed)
Physical Therapy Session Note  Patient Details  Name: Chad Combs MRN: [id number] Date of Birth: 08/10/1955  Today's Date: 09/08/2016 PT Individual Time: 0803-0918 PT Individual Time Calculation (min): 75 min   Short Term Goals: Week 2:  PT Short Term Goal 1 (Week 2): Patient will ambulate 34f with max Assist of 1 PT Short Term Goal 2 (Week 2): Patient will perform sit<>stand with min assist and LRAD  PT Short Term Goal 3 (Week 2): Patient will perform sqaut pivot transfer with min assist  PT Short Term Goal 4 (Week 2): Patient will maintain standing balance with mod assist from PT.  Week 3:     Skilled Therapeutic Interventions/Progress Updates:   Pt received supine in bed and agreeable to PT. Supine>sit transfer with mod assist and moderate cyues for LLE management and sequencing.   Sitting balance and L attention training to complete breakfast sitting EOB with all food places on the L side. Min cues for increased visual scanning and for safety sitting EOB. Pt able to appropriately clear between each bite.    Squat pivot transfer training x 5 to R and L Min assist from PT with min cues for improved head/hips relationship. Squat pivot transfer to toilet with min assist and use of Rail for UE support.  Lateral scooting with BUE on LLE with min assist frmo PT. Sit<>stand transfer training with min assist from PT and mod assist to stabilize in standing.   WC mobility with supervision assist from PT x [id number] moderate cues for imrpoved visual scanning to the L.   Sit<>supine with mod assist to faciliate movement at the LLE and L trunk. NMEs large muscle atrphy setting. Pt able to sense contraction in the L quad, but unable to perform knee extension against gravity in supine position. Standing balance/tolerance following NMES with mild improvement noted in quad contractions to prevent L Lateral LOB with weight shifting.   [facility] to [facility] and all needs met.           Therapy Documentation Precautions:  Precautions Precautions: Fall Precaution Comments: L sided neglect, shoulder increased risk for subluxation; quick LOB L wihtout awareness Restrictions Weight Bearing Restrictions: No Vital Signs: Therapy Vitals Temp: 97.2 F (36.2 C) Temp Source: Oral Pulse Rate: 96 Resp: 18 BP: 120/60 Patient Position (if appropriate): Lying Oxygen Therapy SpO2: 97 % O2 Device: Not Delivered Pain: 0/10   See Function Navigator for Current Functional Status.   Therapy/Group: Individual Therapy  AuLorie Phenix/21/2018, 9:23 AM

## 2016-09-08 NOTE — Progress Notes (Signed)
Physical Therapy Note  Patient Details  Name: Chad Combs MRN: [id number] Date of Birth: Aug 17, 1955 Today's Date: 09/08/2016  1535-1605, 30 min individual tx Pain: L hip 9/10; PROM and re-positioning relieved pain  w/c propulsion on level tile and carpet using hemi method.  neuromuscular re-education via wt bearing, forced use, multimodal cues for L knee stance stability during R hand reaching to L, slightly out of BOs , and during bil minisquats.  PT instructed pt to avoid L knee extensor thrust and hold L knee in slight flexion in wt bearing. Pt left resting in w/c with seat alarm set and quick release belt applied and all needs within reach. Brooke Pace, CO from [facility] arrived to deliver L PRAFO to reduce L hip rotation in bed.     See function navigator for current status.   [name] 09/08/2016, 4:02 PM

## 2016-09-08 NOTE — Progress Notes (Signed)
Orthopedic Tech Progress Note Patient Details:  Chad Combs 1955/09/18 [id number]  Patient ID: Chad Combs, male   DOB: 1955/10/04, 61 y.o.   MRN: [id number]   [name] 09/08/2016, 3:43 PM Called in advanced brace order; spoke with Carolyne Fiscal

## 2016-09-09 ENCOUNTER — Inpatient Hospital Stay (HOSPITAL_COMMUNITY): Payer: Self-pay | Admitting: Occupational Therapy

## 2016-09-09 ENCOUNTER — Inpatient Hospital Stay (HOSPITAL_COMMUNITY): Payer: Self-pay

## 2016-09-09 ENCOUNTER — Inpatient Hospital Stay (HOSPITAL_COMMUNITY): Payer: Self-pay | Admitting: Speech Pathology

## 2016-09-09 LAB — GLUCOSE, CAPILLARY
GLUCOSE-CAPILLARY: 90 mg/dL (ref 65–99)
Glucose-Capillary: 121 mg/dL — ABNORMAL HIGH (ref 65–99)
Glucose-Capillary: 200 mg/dL — ABNORMAL HIGH (ref 65–99)
Glucose-Capillary: 119 mg/dL — ABNORMAL HIGH (ref 65–99)

## 2016-09-09 MED ORDER — TIZANIDINE HCL 2 MG PO TABS
2.0000 mg | ORAL_TABLET | Freq: Every day | ORAL | Status: DC
  Administered 2016-09-09 – 2016-09-30 (×22): 2 mg via ORAL
  Filled 2016-09-09 (×22): qty 1

## 2016-09-09 MED ORDER — BETHANECHOL CHLORIDE 10 MG PO TABS
10.0000 mg | ORAL_TABLET | Freq: Three times a day (TID) | ORAL | Status: DC
  Administered 2016-09-09 – 2016-09-18 (×28): 10 mg via ORAL
  Filled 2016-09-09 (×28): qty 1

## 2016-09-09 NOTE — Progress Notes (Addendum)
Dan PAC was notified about Xray of the pelvis results.

## 2016-09-09 NOTE — Progress Notes (Signed)
Occupational Therapy Session Note  Patient Details  Name: Chad Combs MRN: [id number] Date of Birth: 01/24/1956  Today's Date: 09/09/2016 OT Individual Time: 0915-1000 OT Individual Time Calculation (min): 45 min    Short Term Goals: Week 2:  OT Short Term Goal 1 (Week 2): Pt will complete stand pivot transfer to the 3:1 with mod assist.  OT Short Term Goal 2 (Week 2): Pt will complete sit to stand for pulling pants over hips with min assist for 2 consecutive sessions.  OT Short Term Goal 3 (Week 2): Pt will complete UB dressing with supervision for 2 consecutive sessions.   OT Short Term Goal 4 (Week 2): Pt will perform self AAROM exercises for the LUE following handout with min instructional cueing.   Skilled Therapeutic Interventions/Progress Updates:    Pt received in w/c. He stated he was not interested in bathing today as he had a "tiring" shower yesterday.  Pt agreeable to working on dressing and standing balance.  Pt donned shirt with min A,crossed legs to don pants over feet with A to stabilize L foot on R knee.  Sit to stand at sink with OT on L side to facilitate L knee stability and rehab tech on R side to facilitate midline.  Pt was able to achieved static stand and then used R hand to cleanse bottom for new brief and pull pants over hips 80% of the way. When pt was reaching back his hip and upper trunk was pushing to the L because his knees were too straight and did not use a small knee bend to center his gravity.   The rest of the treatment session focus on static stands at the sink for 1 min with B hands on sink with A to stabilize L hand for wt bearing, followed by 15 mini squats with pt working to keep his body in midline then a 2 min rest. Pt repeated this exercise sequence 4x with excellent attention and focus to his body position.  He required min A with balance for the mini squats. Pt positioned back in chair with cues for using a squat to evenly scoot hips back. Pt with  quick release belt on and all needs met. Chair alarm not on as SLP therapist arriving next.    Therapy Documentation Precautions:  Precautions Precautions: Fall Precaution Comments: L sided neglect, shoulder increased risk for subluxation; quick LOB L wihtout awareness Restrictions Weight Bearing Restrictions: No    Vital Signs: Therapy Vitals Temp: 97.8 F (36.6 C) Temp Source: Oral Pulse Rate: 97 Resp: 18 BP: 115/62 Patient Position (if appropriate): Lying Oxygen Therapy SpO2: 97 % O2 Device: Not Delivered Pain:  no c/o pain ADL:    See Function Navigator for Current Functional Status.   Therapy/Group: Individual Therapy  SAGUIER,JULIA 09/09/2016, 8:00 AM  

## 2016-09-09 NOTE — Progress Notes (Signed)
Speech Language Pathology Weekly Progress and Session Note  Patient Details  Name: Chad Combs MRN: [id number] Date of Birth: Mar 19, 1956  Beginning of progress report period: September 02, 2016 End of progress report period: September 09, 2016  Today's Date: 09/09/2016 SLP Individual Time: 1000-1100 SLP Individual Time Calculation (min): 60 min  Short Term Goals: Week 2: SLP Short Term Goal 1 (Week 2): Given Min A verbal cues, pt will utilize external memory aids to recall new daily information.  SLP Short Term Goal 1 - Progress (Week 2): Met SLP Short Term Goal 2 (Week 2): Pt will utilize compensatory intelligibility strategies with Min A verbal cues to achieve 75% intelligibility at the sentence level.  SLP Short Term Goal 2 - Progress (Week 2): Met SLP Short Term Goal 3 (Week 2): Pt will demonstrate sustained attention in mildly distracting environment for ~30 minutes with Min A verbal cues.  SLP Short Term Goal 3 - Progress (Week 2): Met SLP Short Term Goal 4 (Week 2): Pt will complete basic, familiar problem solving tasks with Mod A verbal cues.  SLP Short Term Goal 4 - Progress (Week 2): Not met SLP Short Term Goal 5 (Week 2): Pt will scan to left of his environment with Mod A verbal cues.  SLP Short Term Goal 5 - Progress (Week 2): Not met SLP Short Term Goal 6 (Week 2): Pt will consume current diet without overt s/s of aspiration with Min A verbal cues.  SLP Short Term Goal 6 - Progress (Week 2): Met    New Short Term Goals: Week 3: SLP Short Term Goal 1 (Week 3): Given Min A verbal cues, pt will recall 1 activity from previous therapy session during day.  SLP Short Term Goal 2 (Week 3): Pt will utilize compensatory intelligibility strategies with Min A verbal cues to achieve >90% intelligibility at the simple conversation level.  SLP Short Term Goal 3 (Week 3): Pt will demonstrate sustained attention in moderately distracting environment for ~30 minutes with Min A verbal cues.  SLP  Short Term Goal 4 (Week 3): Pt will complete basic, familiar problem solving tasks with Mod A verbal cues.  SLP Short Term Goal 5 (Week 3): Pt will scan to left of his environment to locate items within basic, familiar tasks with Mod A verbal cues.  SLP Short Term Goal 6 (Week 3): Pt will demonstrate anticipatory awareness by identifying 3 activites that are safe to participate in within home environement with Mod A verbal cues.   Weekly Progress Updates: Pt has made steady progress this reporting period as evidenced by meeting 4 of 6 STGs and completing 1 LTGs. Pt with progress in the areas of swallow function, speech intelligibility at the sentence to simple coversation level and sustained attention in mildly distracting environment. Pt continues to exhibit significant deficits in completing basic familiar problem solving tasks, scanning to the left of his environment and demonstrating anticipatory awareness. Skilled ST is required to address these deficits to increase functional independence and reduce caregiver burden prior to discharge. Anticipate that pt will need 24 hour supervision at discharge with follow up [place].      Intensity: Minumum of 1-2 x/day, 30 to 90 minutes Frequency: 3 to 5 out of 7 days Duration/Length of Stay: 09/20/16 Treatment/Interventions: Cognitive remediation/compensation;Cueing hierarchy;Dysphagia/aspiration precaution training;Functional tasks;Medication managment;Patient/family education;Therapeutic Activities;Environmental controls;Internal/external aids;Oral motor exercises;Therapeutic Exercise   Daily Session  Skilled Therapeutic Interventions: Skilled treatment session focused on cognition goals. SLP facilitated session by providing Mod A multimodal  cues for completion fo 28 piece puzzle. SLP presorted pieces and then pt required Mod A to locate corner pieces within presorted pieces. Pt required Max A multimodal cues for simple money counting task. Pt with progress  towards sustained attention in mildly distracting environment and is able to sustain his attention for ~30 minutes with Min faded to supervision cues. Pt unable to demonstrate any emergent or anticipatory awareness. Pt was returned to room, left uprigiht in wheelchair, safety belt donned, chair alarm on and all needs within reach. Continue current plan of care.      Function:    Cognition Comprehension Comprehension assist level: Understands basic 90% of the time/cues < 10% of the time  Expression   Expression assist level: Expresses basic needs/ideas: With no assist  Social Interaction Social Interaction assist level: Interacts appropriately 75 - 89% of the time - Needs redirection for appropriate language or to initiate interaction.  Problem Solving Problem solving assist level: Solves basic 50 - 74% of the time/requires cueing 25 - 49% of the time  Memory Memory assist level: Recognizes or recalls 75 - 89% of the time/requires cueing 10 - 24% of the time   General    Pain    Therapy/Group: Individual Therapy   Abdulah Iqbal B. [name], M.S., CCC-SLP Speech-Language Pathologist   Kataleia Quaranta 09/09/2016, 10:36 AM

## 2016-09-09 NOTE — Progress Notes (Signed)
Physical Therapy Weekly Progress Note  Patient Details  Name: Chad Combs MRN: [id number] Date of Birth: September 09, 1955  Beginning of progress report period: 09/03/16 End of progress report period: 09/09/16  Today's Date: 09/09/2016 PT Individual Time: 0800-0900; 1300-1330 PT Individual Time Calculation (min): 60 min , 30 min  Patient has met 1 of 4 short term goals.  He partly met the other 3 goals. Pt's impulsivity continues to be a significant limiting factor.  His pushing tendencies have decreased.   Patient continues to demonstrate the following deficits muscle joint tightness, decreased cardiorespiratoy endurance, impaired timing and sequencing, abnormal tone and unbalanced muscle activation, decreased visual perceptual skills and field cut, decreased awareness, decreased problem solving, decreased safety awareness and decreased memory and decreased sitting balance, decreased standing balance, hemiplegia and decreased balance strategies and therefore will continue to benefit from skilled PT intervention to increase functional independence with mobility.  Patient progressing toward long term goals..  Continue plan of care.  PT Short Term Goals   Week 2:  PT Short Term Goal 1 (Week 2): Patient will ambulate 77f with max Assist of 1 PT Short Term Goal 2 (Week 2): Patient will perform sit<>stand with min assist and LRAD  PT Short Term Goal 2 - Progress (Week 2): Met PT Short Term Goal 3 (Week 2): Patient will perform sqaut pivot transfer with min assist  PT Short Term Goal 3 - Progress (Week 2): Met PT Short Term Goal 4 (Week 2): Patient will maintain standing balance with mod assist from PT.  PT Short Term Goal 4 - Progress (Week 2): Met  Skilled Therapeutic Interventions/Progress Updates: tx 1: Pt sound asleep, but easily aroused.  neuromuscular re-education via multimodal cues for supine: 10 x 2 each bil bridging with assistance for LLE, modified abdominal crunch with R shoulder  protraction, bil lower trunk rotation. Seated bil glut sets x 10.  In R side lying: 2 x 10 AROM hip flex/extension.  Sustained stretch obliques with positioning during lower trunk rotation.   Pt stated he needed to urinate.  Supine> sit from R sidelying with close supervision after set-up of LEs. Stedy transfer to toilet. Pt continent of bladder; no BM. w/c propulsion using hemi technique during dual task of scanning for room #s on L side of hall; mod cues; min assist for steering during turn to avoid bumping L foot. Pt left resting in w/c with quick release belt applied, seat alarm set and all needs within reach.  tx 2:  w/c propulsion using hemi technique x 50' with min assist at times for L attention and steering.  Gait using railing in hallway, LAFO, L shoe cover, x 25' with max assist for advancement of LLE, upright trunk, wt shifting and avoiding pushing tendency.  Pt is able to drop L pelvis for passive L hip and knee flexion, but unable to slide foot along floor; max cues and assistance to remember to extend L knee before stepping with R foot. When taking last step, pt took an extra step with R foot, leaving LLE far behind him; +2 required to prevent fall and return pt to sitting in w/c.  Pt with poor awareness of why this happened. Pt left resting in w/c with quick release belt applied , seat alarm set, andand all needs within reach.     Therapy Documentation Precautions:  Precautions Precautions: Fall Precaution Comments: L sided neglect, shoulder increased risk for subluxation; quick LOB L wihtout awareness Restrictions Weight Bearing Restrictions: No  Vital Signs: Therapy Vitals Pulse Rate: 87 Resp: 18 Patient Position (if appropriate): Sitting Oxygen Therapy SpO2: 95 % O2 Device: Not Delivered Pain: no c/o, after AROM L hip     Other Treatments: Treatments Therapeutic Activity: bed mobility, bridging, static and dynamic standing, gait Neuromuscular Facilitation: Left;Lower  Extremity;Forced use;Activity to increase motor control;Activity to increase timing and sequencing;Activity to increase lateral weight shifting;Activity to increase anterior-posterior weight shifting   See Function Navigator for Current Functional Status.  Therapy/Group: Individual Therapy  Livana Yerian 09/09/2016, 9:13 AM

## 2016-09-09 NOTE — Progress Notes (Addendum)
Subjective/Complaints:  Patient in wheelchair, having some left groin pain during the night. He feels like he has some twitching in his left hip area. At night, mainly. No pain with standing.  ROS   Denies chest pain, shortness of breath, nausea, vomiting, diarrhea, constipation.  Objective: Vital Signs: Blood pressure 115/62, pulse 97, temperature 97.8 F (36.6 C), temperature source Oral, resp. rate 18, height 5' 9"  (1.753 m), weight 91.9 kg (202 lb 8.6 oz), SpO2 97 %. No results found. Results for orders placed or performed during the hospital encounter of 08/25/16 (from the past 72 hour(s))  Glucose, capillary     Status: Abnormal   Collection Time: 09/06/16 11:22 AM  Result Value Ref Range   Glucose-Capillary 207 (H) 65 - 99 mg/dL   Comment 1 Notify RN   Glucose, capillary     Status: Abnormal   Collection Time: 09/06/16  4:27 PM  Result Value Ref Range   Glucose-Capillary 105 (H) 65 - 99 mg/dL   Comment 1 Notify RN   Glucose, capillary     Status: Abnormal   Collection Time: 09/06/16 10:23 PM  Result Value Ref Range   Glucose-Capillary 144 (H) 65 - 99 mg/dL  Glucose, capillary     Status: Abnormal   Collection Time: 09/07/16  6:47 AM  Result Value Ref Range   Glucose-Capillary 126 (H) 65 - 99 mg/dL  Glucose, capillary     Status: Abnormal   Collection Time: 09/07/16 11:58 AM  Result Value Ref Range   Glucose-Capillary 183 (H) 65 - 99 mg/dL  Glucose, capillary     Status: None   Collection Time: 09/07/16  4:35 PM  Result Value Ref Range   Glucose-Capillary 78 65 - 99 mg/dL  Glucose, capillary     Status: Abnormal   Collection Time: 09/07/16  9:05 PM  Result Value Ref Range   Glucose-Capillary 142 (H) 65 - 99 mg/dL  Glucose, capillary     Status: Abnormal   Collection Time: 09/08/16  6:52 AM  Result Value Ref Range   Glucose-Capillary 129 (H) 65 - 99 mg/dL  Basic metabolic panel     Status: Abnormal   Collection Time: 09/08/16  9:52 AM  Result Value Ref Range    Sodium 138 135 - 145 mmol/L   Potassium 3.8 3.5 - 5.1 mmol/L   Chloride 101 101 - 111 mmol/L   CO2 24 22 - 32 mmol/L   Glucose, Bld 214 (H) 65 - 99 mg/dL   BUN 18 6 - 20 mg/dL   Creatinine, Ser 1.07 0.61 - 1.24 mg/dL   Calcium 9.8 8.9 - 10.3 mg/dL   GFR calc non Af Amer >60 >60 mL/min   GFR calc Af Amer >60 >60 mL/min    Comment: (NOTE) The eGFR has been calculated using the CKD EPI equation. This calculation has not been validated in all clinical situations. eGFR's persistently <60 mL/min signify possible Chronic Kidney Disease.    Anion gap 13 5 - 15  CBC     Status: Abnormal   Collection Time: 09/08/16  9:52 AM  Result Value Ref Range   WBC 12.9 (H) 4.0 - 10.5 K/uL   RBC 5.36 4.22 - 5.81 MIL/uL   Hemoglobin 17.9 (H) 13.0 - 17.0 g/dL   HCT 52.5 (H) 39.0 - 52.0 %   MCV 97.9 78.0 - 100.0 fL   MCH 33.4 26.0 - 34.0 pg   MCHC 34.1 30.0 - 36.0 g/dL   RDW 12.1 11.5 - 15.5 %  Platelets 275 150 - 400 K/uL  Glucose, capillary     Status: Abnormal   Collection Time: 09/08/16 11:42 AM  Result Value Ref Range   Glucose-Capillary 188 (H) 65 - 99 mg/dL  Glucose, capillary     Status: Abnormal   Collection Time: 09/08/16  4:41 PM  Result Value Ref Range   Glucose-Capillary 144 (H) 65 - 99 mg/dL  Glucose, capillary     Status: Abnormal   Collection Time: 09/08/16  9:49 PM  Result Value Ref Range   Glucose-Capillary 144 (H) 65 - 99 mg/dL     HEENT: normal Cardio: RRR and No murmur Resp: CTA B/L and Unlabored GI: BS positive and Nontender, nondistended Extremity:  No Edema Skin:   Intact Neuro: Lethargic, Flat, Abnormal Sensory Reduced sensation to pinch in left upper extremity greater than left lower extremity. Intact sensation on the right, Abnormal Motor Motor is 5/5 in the right deltoid, biceps, triceps, grip, hip flexor, knee extensor, ankle dorsiflexor, trace left biceps and finger flexors, 2 minus, left hip, knee extensor synergy, 0 at the ankle, Abnormal FMC Ataxic/ dec  FMC and Dysarthric Musc/Skel:  Other No pain with upper extremity or lower extremity range of motion Gen. no acute distress   Assessment/Plan: 1. Functional deficits secondary to Right MCA infarct with Left Hemiparesis which require 3+ hours per day of interdisciplinary therapy in a comprehensive inpatient rehab setting. Physiatrist is providing close team supervision and 24 hour management of active medical problems listed below. Physiatrist and rehab team continue to assess barriers to discharge/monitor patient progress toward functional and medical goals. FIM: Function - Bathing Position: Shower Body parts bathed by patient: Left arm, Chest, Abdomen, Front perineal area, Right upper leg, Left upper leg, Right lower leg Body parts bathed by helper: Right arm, Buttocks, Left lower leg, Back Assist Level: Touching or steadying assistance(Pt > 75%)  Function- Upper Body Dressing/Undressing What is the patient wearing?: Pull over shirt/dress Pull over shirt/dress - Perfomed by patient: Thread/unthread right sleeve, Put head through opening, Thread/unthread left sleeve, Pull shirt over trunk Pull over shirt/dress - Perfomed by helper: Thread/unthread left sleeve, Pull shirt over trunk Assist Level: Supervision or verbal cues Function - Lower Body Dressing/Undressing What is the patient wearing?: Pants, Non-skid slipper socks, Shoes Position: Wheelchair/chair at sink Underwear - Performed by helper: Thread/unthread right underwear leg, Thread/unthread left underwear leg, Pull underwear up/down Pants- Performed by patient: Thread/unthread right pants leg Pants- Performed by helper: Thread/unthread left pants leg, Pull pants up/down Non-skid slipper socks- Performed by patient: Don/doff right sock Non-skid slipper socks- Performed by helper: Don/doff left sock Socks - Performed by patient: Don/doff left sock Shoes - Performed by patient: Don/doff right shoe Shoes - Performed by helper:  Don/doff left shoe, Fasten right, Fasten left Assist for footwear: Dependant Assist for lower body dressing:  (Max assist)  Function - Toileting Toileting activity did not occur: No continent bowel/bladder event Toileting steps completed by helper: Adjust clothing prior to toileting, Performs perineal hygiene, Adjust clothing after toileting Toileting Assistive Devices:  (steady) Assist level: Touching or steadying assistance (Pt.75%) (steady)  Function - Air cabin crew transfer activity did not occur:  (using urinal or incont., no BM) Toilet transfer assistive device: Elevated toilet seat/BSC over toilet Mechanical lift: Stedy Assist level to toilet: Total assist (Pt < 25%) Assist level from toilet: Total assist (Pt < 25%)  Function - Chair/bed transfer Chair/bed transfer method: Stand pivot Chair/bed transfer assist level: Moderate assist (Pt 50 - 74%/lift or  lower) Chair/bed transfer assistive device: Environmental manager lift: Stedy Chair/bed transfer details: Manual facilitation for weight shifting, Verbal cues for precautions/safety, Verbal cues for technique, Manual facilitation for placement  Function - Locomotion: Wheelchair Will patient use wheelchair at discharge?: Yes Type: Manual Max wheelchair distance: 100 Assist Level: Supervision or verbal cues Assist Level: Supervision or verbal cues Assist Level: Supervision or verbal cues Turns around,maneuvers to table,bed, and toilet,negotiates 3% grade,maneuvers on rugs and over doorsills: No Function - Locomotion: Ambulation Ambulation activity did not occur: Safety/medical concerns (pt pushes toward hemi side; unsafe) Assistive device: Rail in hallway, Orthosis Max distance: 10 Assist level: 2 helpers Walk 10 feet activity did not occur: Safety/medical concerns Assist level: 2 helpers Walk 50 feet with 2 turns activity did not occur: Safety/medical concerns Walk 150 feet activity did not occur: Safety/medical  concerns Walk 10 feet on uneven surfaces activity did not occur: Safety/medical concerns  Function - Comprehension Comprehension: Auditory Comprehension assist level: Understands basic 90% of the time/cues < 10% of the time  Function - Expression Expression: Verbal Expression assist level: Expresses basic needs/ideas: With no assist  Function - Social Interaction Social Interaction assist level: Interacts appropriately 75 - 89% of the time - Needs redirection for appropriate language or to initiate interaction.  Function - Problem Solving Problem solving assist level: Solves basic 75 - 89% of the time/requires cueing 10 - 24% of the time  Function - Memory Memory assist level: Recognizes or recalls 75 - 89% of the time/requires cueing 10 - 24% of the time Patient normally able to recall (first 3 days only): Current season, Location of own room, Staff names and faces, That he or she is in a hospital  Medical Problem List and Plan: 1.  Left hemiplegia, limitations with self-care, difficulty with transfers secondary to right MCA CVA. Cont CIR PT, OT, speech.  2.  DVT Prophylaxis/Anticoagulation: Pharmaceutical: Lovenox 3. Pain Management: Left hip/groin pain may be spasms , plus minus. Osteoarthritis. Check x-ray, Zanaflex at night 4. Mood: Team to provide ego support. Expressing anxiety about current situation. Has supportive family. LCSW to follow for evaluation and support.  5. Neuropsych: This patient is capable of making decisions on his own behalf. 6. Skin/Wound Care: routine pressure relief measures.  7. Fluids/Electrolytes/Nutrition: Monitor I/O. Check lytes in am. Encourage fluid intake.  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 9. T2DM: Hgb A1C- 10.5 --. Continue metformin 1047m bid. Was started on lantus adjust per am CBG,increase to 45 units daily at bedtime, with SSI for elevated BS--will consult RD for diabetic  education and start education on insulin administration.  Will monitor BS ac/hs, goal 115 to 180,3/22  CBG (last 3)   Recent Labs  09/08/16 1142 09/08/16 1641 09/08/16 2149  GLUCAP 188* 144* 144*    10. HTN: Monitor BP bid--on lisinopril 2106mdaily.stabilizing in normal range 3/22 Vitals:   09/08/16 1520 09/09/16 0422  BP: 125/72 115/62  Pulse: 95 97  Resp: 18 18  Temp: 98.1 F (36.7 C) 97.8 F (36.6 C)   11. Leucocytosis: Monitor for signs of infection. UA negative. Afebrile,WBC 12.9 12. Polycythemia: Smokes 1.5 PPD. Expect this to remain elevated during hospitalization, On NicoDerm patch 13. COPD: SOB reported--CXR negative. Encourage tobacco cessation.  Managed without medications at this time.   14. Tobacco abuse: Counsel  14. Hypoalbuminemia, start pro-stat 15. Urinary retention,   BPH, trial of Flomax,  Reduce Urecholine 1039mID,improved, will wean  urecholine dose LOS (Days) 15 A FACE TO FACE EVALUATION WAS PERFORMED  Rokia Bosket E 09/09/2016, 6:42 AM

## 2016-09-09 NOTE — Progress Notes (Signed)
Social Work Patient ID: Chad Combs, male   DOB: 09-May-1956, 61 y.o.   MRN: [id number]  Met with pt and spoke with son-Will via telephone to discuss team conference progression toward his goals  And target discharge date still 4/4. He has decided not to have his brother come and assist with pt's care, feel it would not be beneifical to pt. He will have pt come to his home and pt's sister will  be assisting and will make sure pt has 24 hr care at discharge. He feels after the three weeks he will be better able to know how much care Dad still requires and what would be the best option for him. He is thinking about a retirement community for him and knows he may always need care, especially for his diabetes. Still working on disability with law firm. Discussed family education for week before discharge. He will look at his schedule and get back with this worker.

## 2016-09-10 ENCOUNTER — Inpatient Hospital Stay (HOSPITAL_COMMUNITY): Payer: Self-pay | Admitting: Occupational Therapy

## 2016-09-10 ENCOUNTER — Inpatient Hospital Stay (HOSPITAL_COMMUNITY): Payer: Self-pay

## 2016-09-10 ENCOUNTER — Inpatient Hospital Stay (HOSPITAL_COMMUNITY): Payer: Self-pay | Admitting: Speech Pathology

## 2016-09-10 ENCOUNTER — Inpatient Hospital Stay (HOSPITAL_COMMUNITY): Payer: Self-pay | Admitting: Physical Therapy

## 2016-09-10 DIAGNOSIS — I714 Abdominal aortic aneurysm, without rupture: Secondary | ICD-10-CM

## 2016-09-10 DIAGNOSIS — E089 Diabetes mellitus due to underlying condition without complications: Secondary | ICD-10-CM

## 2016-09-10 DIAGNOSIS — I63511 Cerebral infarction due to unspecified occlusion or stenosis of right middle cerebral artery: Secondary | ICD-10-CM

## 2016-09-10 LAB — GLUCOSE, CAPILLARY
GLUCOSE-CAPILLARY: 179 mg/dL — AB (ref 65–99)
Glucose-Capillary: 101 mg/dL — ABNORMAL HIGH (ref 65–99)
Glucose-Capillary: 86 mg/dL (ref 65–99)
Glucose-Capillary: 99 mg/dL (ref 65–99)

## 2016-09-10 MED ORDER — IOPAMIDOL (ISOVUE-370) INJECTION 76%
INTRAVENOUS | Status: AC
  Administered 2016-09-10: 100 mL
  Filled 2016-09-10: qty 100

## 2016-09-10 NOTE — Progress Notes (Signed)
Speech Language Pathology Daily Session Note  Patient Details  Name: Chad Combs MRN: [id number] Date of Birth: 01-Oct-1955  Today's Date: 09/10/2016 SLP Individual Time: 1045-1130 SLP Individual Time Calculation (min): 45 min  Short Term Goals: Week 3: SLP Short Term Goal 1 (Week 3): Given Min A verbal cues, pt will recall 1 activity from previous therapy session during day.  SLP Short Term Goal 2 (Week 3): Pt will utilize compensatory intelligibility strategies with Min A verbal cues to achieve >90% intelligibility at the simple conversation level.  SLP Short Term Goal 3 (Week 3): Pt will demonstrate sustained attention in moderately distracting environment for ~30 minutes with Min A verbal cues.  SLP Short Term Goal 4 (Week 3): Pt will complete basic, familiar problem solving tasks with Mod A verbal cues.  SLP Short Term Goal 5 (Week 3): Pt will scan to left of his environment to locate items within basic, familiar tasks with Mod A verbal cues.  SLP Short Term Goal 6 (Week 3): Pt will demonstrate anticipatory awareness by identifying 3 activites that are safe to participate in within home environement with Mod A verbal cues.   Skilled Therapeutic Interventions: Skilled treatment session focused on cognition goals. SLP facilitated session by providing Mod A verbal cues for completion of previously learned puzzle. Pt requires Mod to Max multimodal cues to organize task. Pt required Min A verbal cues for sustained attention in moderately distracting environment and he was able to filter out his cell phone notifications during session. Pt required Mod A verbal cues to identify 1 task that he can safely perform within home environment. Pt was returned to room, left upright in wheelchair, chair alarm on and safety belt donned. All needs within reach. Continue current plan of care.      Function:    Cognition Comprehension Comprehension assist level: Understands complex 90% of the time/cues 10%  of the time  Expression   Expression assist level: Expresses basic needs/ideas: With no assist  Social Interaction Social Interaction assist level: Interacts appropriately 75 - 89% of the time - Needs redirection for appropriate language or to initiate interaction.  Problem Solving Problem solving assist level: Solves basic 50 - 74% of the time/requires cueing 25 - 49% of the time  Memory Memory assist level: Recognizes or recalls 75 - 89% of the time/requires cueing 10 - 24% of the time    Pain    Therapy/Group: Individual Therapy   Mung Rinker B. [name], M.S., CCC-SLP Speech-Language Pathologist   [name] 09/10/2016, 11:11 AM

## 2016-09-10 NOTE — Progress Notes (Signed)
Occupational Therapy Weekly Progress Note  Patient Details  Name: Chad Combs MRN: [id number] Date of Birth: 06-24-1955  Beginning of progress report period: September 02, 2016 End of progress report period: September 10, 2016  Today's Date: 09/10/2016 OT Individual Time: 3845-3646 OT Individual Time Calculation (min): 60 min    Patient has met 4 of 4 short term goals.  Pt did meet the goal of transferring with mod A but he continues to need a second person with challenging transfers to the shower and tub bench for safety as he continues to move impulsively.  Towards the end of the session pt was moving more slowly and methodically and his transfers had become safer.  He is also doing well with his sit to stand skills, but does need significant cues for safe L foot placement to prepare for standing.  He is managing his LUE better to don a shirt and complete his UE exercise program.  Patient continues to demonstrate the following deficits: decreased attention to left, decreased problem solving, decreased safety awareness and decreased memory and decreased sitting balance, decreased standing balance, decreased postural control, hemiplegia and decreased balance strategies and therefore will continue to benefit from skilled OT intervention to enhance overall performance with BADL.  Patient progressing toward long term goals..  Continue plan of care.  OT Short Term Goals Week 1:  OT Short Term Goal 1 (Week 1): Pt will maintain static sitting balance for at least 5 mins with supervision in preparation for selfcare tasks.  OT Short Term Goal 1 - Progress (Week 1): Met OT Short Term Goal 2 (Week 1): Pt will complete UB bathing with min assist in unsupported sitting.  OT Short Term Goal 2 - Progress (Week 1): Met OT Short Term Goal 3 (Week 1): Pt will complete LB bathing with max assist sit to stand.  OT Short Term Goal 3 - Progress (Week 1): Met OT Short Term Goal 4 (Week 1): Pt will donn pullover shirt in  supported sitting with min assist 2 consecutive sessions.  OT Short Term Goal 4 - Progress (Week 1): Met OT Short Term Goal 5 (Week 1): Pt will donn pullup pants with max assist sit to stand for 2 consecutive sessions.  OT Short Term Goal 5 - Progress (Week 1): Met Week 2:  OT Short Term Goal 1 (Week 2): Pt will complete stand pivot transfer to the 3:1 with mod assist.  OT Short Term Goal 1 - Progress (Week 2): Partly met (pt only needs mod A, but +2 A for safety due to impulsivity) OT Short Term Goal 2 (Week 2): Pt will complete sit to stand for pulling pants over hips with min assist for 2 consecutive sessions.  OT Short Term Goal 2 - Progress (Week 2): Met OT Short Term Goal 3 (Week 2): Pt will complete UB dressing with supervision for 2 consecutive sessions.   OT Short Term Goal 3 - Progress (Week 2): Met OT Short Term Goal 4 (Week 2): Pt will perform self AAROM exercises for the LUE following handout with min instructional cueing.  OT Short Term Goal 4 - Progress (Week 2): Met Week 3:  OT Short Term Goal 1 (Week 3): Pt will consistently complete transfer to toilet with mod A of 1 person A only. OT Short Term Goal 2 (Week 3): Pt will be able to pull pants over hips himself with min A to stabilize balance. OT Short Term Goal 3 (Week 3): Pt will be able to  wash R and L leg arm using a long handled sponge with S. OT Short Term Goal 4 (Week 3): Pt will be able to wt shift to L side safely to wash bottom with lateral lean on tub bench with steadying A.  Skilled Therapeutic Interventions/Progress Updates:    Pt seen for ADL retraining with a focus on LUE management, balance, safety awareness, processing.  Pt received in bed, transferred to w/c with mod A for physical lifting but 2nd person assisted with guiding of his hip as he was not fully attending to L side.  He then transferred to [facility] over toilet using bar with the same level of A.  Reviewed with pt safe techniques of hand/ foot placement and he  was able to carry out much better on and off tub bench.  Bathed and stood with min A using bar in shower for therapist to wash his bottom.  Next shower, pt should try a lateral lean to wash hips as this method may be safer at home.  Completed dressing with improved standing balance at sink so he could pull pants over hips 75% of the way.  completed oral care at the sink.  Pt worked on self ROM and A/arom for 5 min with cues from therapist.  Pt set up in w/c with quick release belt and chair alarm on. Arm trough in place.  Call light in reach.  Therapy Documentation Precautions:  Precautions Precautions: Fall Precaution Comments: L sided neglect, shoulder increased risk for subluxation; quick LOB L wihtout awareness Restrictions Weight Bearing Restrictions: No       Pain: no c/o pain   ADL:   See Function Navigator for Current Functional Status.   Therapy/Group: Individual Therapy  Kaleab Frasier 09/10/2016, 12:44 PM

## 2016-09-10 NOTE — Progress Notes (Signed)
Subjective/Complaints:  Xray and abd [place] finding noted, discussed results with patient, as well as with vascular surgeon, Dr. Donzetta Matters  ROS   Denies chest pain, shortness of breath, nausea, vomiting, diarrhea, constipation. No back pain  Objective: Vital Signs: Blood pressure 113/62, pulse 78, temperature 97.4 F (36.3 C), temperature source Oral, resp. rate 18, height 5' 9"  (1.753 m), weight 91.9 kg (202 lb 8.6 oz), SpO2 92 %. US Abdominal Aorta Screening Aaa  Result Date: 09/09/2016 CLINICAL DATA:  History of stroke, screen for aneurysm EXAM: ULTRASOUND OF ABDOMINAL AORTA TECHNIQUE: Ultrasound examination of the abdominal aorta was performed to evaluate for abdominal aortic aneurysm. COMPARISON:  None. FINDINGS: Abdominal Aorta Diffuse wall irregularity, consistent with atherosclerotic vascular disease. Probable mural thrombus at the mid aortic segment. Maximum Diameter: Proximal aorta:  3.4 cm AP x 2.9 cm transverse Mid aorta:  6.5 cm AP by 5.9 cm transverse Distal aorta:  2.8 cm AP x 3.2 cm transverse. 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 CTA is recommended for further evaluation. Vascular surgery consultation recommended due to increased risk of rupture for AAA >5.5 cm. This recommendation follows ACR consensus guidelines: White Paper of the ACR Incidental Findings Committee II on Vascular Findings. J Am Coll Radiol 2013; 10:789-794. These results will be called to the ordering clinician or representative by the Radiologist Assistant, and communication documented in the PACS or zVision Dashboard. Electronically Signed   By: Donavan Foil M.D.   On: 09/09/2016 22:33   Dg Hip Unilat With Pelvis 2-3 Views Left  Result Date: 09/09/2016 CLINICAL DATA:  61 year old male with left hip pain. No known injury. EXAM: DG HIP (WITH OR WITHOUT PELVIS) 2-3V LEFT  COMPARISON:  None. FINDINGS: Femoral heads are normally located. Hip joint spaces are symmetric and normal. Bone mineralization is within normal limits for age. The pelvis is intact. Sacral ala and SI joints appear normal. Grossly intact proximal right femur. Proximal left femur is intact and appears normal. There is curvilinear eggshell type calcification projecting over the lower lumbar spine encompassing an area of up to 7 cm (arrows). This is nonspecific but appears to be vascular related. IMPRESSION: 1. Possible rim calcified large infrarenal abdominal aortic aneurysm. Follow-up Aortic Ultrasound might be the simplest way to evaluate further. Otherwise CTA abdomen and pelvis with contrast would be recommended. 2. Otherwise normal radiographic appearance of the left hip and pelvis. 3. These results will be called to the ordering clinician or representative by the Radiologist Assistant, and communication documented in the PACS or zVision Dashboard. Electronically Signed   By: Genevie Ann M.D.   On: 09/09/2016 14:27   Results for orders placed or performed during the hospital encounter of 08/25/16 (from the past 72 hour(s))  Glucose, capillary     Status: Abnormal   Collection Time: 09/07/16  6:47 AM  Result Value Ref Range   Glucose-Capillary 126 (H) 65 - 99 mg/dL  Glucose, capillary     Status: Abnormal   Collection Time: 09/07/16 11:58 AM  Result Value Ref Range   Glucose-Capillary 183 (H) 65 - 99 mg/dL  Glucose, capillary     Status: None   Collection Time: 09/07/16  4:35 PM  Result Value Ref Range   Glucose-Capillary 78 65 - 99 mg/dL  Glucose, capillary     Status: Abnormal   Collection Time: 09/07/16  9:05 PM  Result Value Ref Range   Glucose-Capillary 142 (H) 65 - 99 mg/dL  Glucose, capillary     Status: Abnormal   Collection Time: 09/08/16  6:52 AM  Result Value Ref Range   Glucose-Capillary 129 (H) 65 - 99 mg/dL  Basic metabolic panel     Status: Abnormal   Collection Time: 09/08/16   9:52 AM  Result Value Ref Range   Sodium 138 135 - 145 mmol/L   Potassium 3.8 3.5 - 5.1 mmol/L   Chloride 101 101 - 111 mmol/L   CO2 24 22 - 32 mmol/L   Glucose, Bld 214 (H) 65 - 99 mg/dL   BUN 18 6 - 20 mg/dL   Creatinine, Ser 1.07 0.61 - 1.24 mg/dL   Calcium 9.8 8.9 - 10.3 mg/dL   GFR calc non Af Amer >60 >60 mL/min   GFR calc Af Amer >60 >60 mL/min    Comment: (NOTE) The eGFR has been calculated using the CKD EPI equation. This calculation has not been validated in all clinical situations. eGFR's persistently <60 mL/min signify possible Chronic Kidney Disease.    Anion gap 13 5 - 15  CBC     Status: Abnormal   Collection Time: 09/08/16  9:52 AM  Result Value Ref Range   WBC 12.9 (H) 4.0 - 10.5 K/uL   RBC 5.36 4.22 - 5.81 MIL/uL   Hemoglobin 17.9 (H) 13.0 - 17.0 g/dL   HCT 52.5 (H) 39.0 - 52.0 %   MCV 97.9 78.0 - 100.0 fL   MCH 33.4 26.0 - 34.0 pg   MCHC 34.1 30.0 - 36.0 g/dL   RDW 12.1 11.5 - 15.5 %   Platelets 275 150 - 400 K/uL  Glucose, capillary     Status: Abnormal   Collection Time: 09/08/16 11:42 AM  Result Value Ref Range   Glucose-Capillary 188 (H) 65 - 99 mg/dL  Glucose, capillary     Status: Abnormal   Collection Time: 09/08/16  4:41 PM  Result Value Ref Range   Glucose-Capillary 144 (H) 65 - 99 mg/dL  Glucose, capillary     Status: Abnormal   Collection Time: 09/08/16  9:49 PM  Result Value Ref Range   Glucose-Capillary 144 (H) 65 - 99 mg/dL  Glucose, capillary     Status: Abnormal   Collection Time: 09/09/16  6:46 AM  Result Value Ref Range   Glucose-Capillary 121 (H) 65 - 99 mg/dL  Glucose, capillary     Status: Abnormal   Collection Time: 09/09/16 11:20 AM  Result Value Ref Range   Glucose-Capillary 200 (H) 65 - 99 mg/dL   Comment 1 Notify RN   Glucose, capillary     Status: Abnormal   Collection Time: 09/09/16  4:43 PM  Result Value Ref Range   Glucose-Capillary 119 (H) 65 - 99 mg/dL   Comment 1 Notify RN   Glucose, capillary     Status:  None   Collection Time: 09/09/16  9:00 PM  Result Value Ref Range   Glucose-Capillary 90 65 - 99 mg/dL     HEENT: normal Cardio: RRR and No murmur Resp: CTA B/L and Unlabored GI: BS positive and Nontender, nondistended Extremity:  No Edema Skin:   Intact Neuro: Lethargic, Flat, Abnormal Sensory Reduced sensation to pinch in left upper extremity greater than left lower extremity. Intact sensation on the right, Abnormal Motor Motor is 5/5 in the right deltoid, biceps, triceps, grip, hip flexor, knee extensor, ankle dorsiflexor, trace left biceps and  finger flexors, 2 minus, left hip, knee extensor synergy, 0 at the ankle, Abnormal FMC Ataxic/ dec FMC and Dysarthric Musc/Skel:  Other No pain with upper extremity or lower extremity range of motion Gen. no acute distress   Assessment/Plan: 1. Functional deficits secondary to Right MCA infarct with Left Hemiparesis which require 3+ hours per day of interdisciplinary therapy in a comprehensive inpatient rehab setting. Physiatrist is providing close team supervision and 24 hour management of active medical problems listed below. Physiatrist and rehab team continue to assess barriers to discharge/monitor patient progress toward functional and medical goals. FIM: Function - Bathing Position: Shower Body parts bathed by patient: Left arm, Chest, Abdomen, Front perineal area, Right upper leg, Left upper leg, Right lower leg Body parts bathed by helper: Right arm, Buttocks, Left lower leg, Back Assist Level: Touching or steadying assistance(Pt > 75%)  Function- Upper Body Dressing/Undressing What is the patient wearing?: Pull over shirt/dress Pull over shirt/dress - Perfomed by patient: Thread/unthread right sleeve, Put head through opening, Thread/unthread left sleeve, Pull shirt over trunk Pull over shirt/dress - Perfomed by helper: Thread/unthread left sleeve, Pull shirt over trunk Assist Level: Supervision or verbal cues Function - Lower Body  Dressing/Undressing What is the patient wearing?: Pants, Non-skid slipper socks, Shoes Position: Wheelchair/chair at sink Underwear - Performed by helper: Thread/unthread right underwear leg, Thread/unthread left underwear leg, Pull underwear up/down Pants- Performed by patient: Thread/unthread right pants leg Pants- Performed by helper: Thread/unthread left pants leg, Pull pants up/down Non-skid slipper socks- Performed by patient: Don/doff right sock Non-skid slipper socks- Performed by helper: Don/doff left sock Socks - Performed by patient: Don/doff left sock Shoes - Performed by patient: Don/doff right shoe Shoes - Performed by helper: Don/doff left shoe, Fasten right, Fasten left Assist for footwear: Dependant Assist for lower body dressing:  (Max assist)  Function - Toileting Toileting activity did not occur: No continent bowel/bladder event Toileting steps completed by helper: Adjust clothing prior to toileting, Performs perineal hygiene, Adjust clothing after toileting Toileting Assistive Devices: Grab bar or rail Assist level: Touching or steadying assistance (Pt.75%)  Function - Air cabin crew transfer activity did not occur:  (using urinal or incont., no BM) Toilet transfer assistive device: Elevated toilet seat/BSC over toilet Mechanical lift: Stedy Assist level to toilet: Moderate assist (Pt 50 - 74%/lift or lower) Assist level from toilet: Moderate assist (Pt 50 - 74%/lift or lower)  Function - Chair/bed transfer Chair/bed transfer method: Other Chair/bed transfer assist level: Moderate assist (Pt 50 - 74%/lift or lower) Chair/bed transfer assistive device: Environmental manager lift: Stedy Chair/bed transfer details: Manual facilitation for weight shifting, Verbal cues for precautions/safety, Verbal cues for technique, Manual facilitation for placement  Function - Locomotion: Wheelchair Will patient use wheelchair at discharge?: Yes Type: Manual Max  wheelchair distance: 50 Assist Level: Touching or steadying assistance (Pt > 75%) Assist Level: Supervision or verbal cues Assist Level: Supervision or verbal cues Turns around,maneuvers to table,bed, and toilet,negotiates 3% grade,maneuvers on rugs and over doorsills: No Function - Locomotion: Ambulation Ambulation activity did not occur: Safety/medical concerns (pt pushes toward hemi side; unsafe) Assistive device: Rail in hallway, Orthosis Max distance: 10 Assist level: 2 helpers Walk 10 feet activity did not occur: Safety/medical concerns Assist level: 2 helpers Walk 50 feet with 2 turns activity did not occur: Safety/medical concerns Walk 150 feet activity did not occur: Safety/medical concerns Walk 10 feet on uneven surfaces activity did not occur: Safety/medical concerns  Function - Comprehension Comprehension: Auditory Comprehension assist level:  Understands complex 90% of the time/cues 10% of the time  Function - Expression Expression: Verbal Expression assist level: Expresses basic needs/ideas: With no assist  Function - Social Interaction Social Interaction assist level: Interacts appropriately 75 - 89% of the time - Needs redirection for appropriate language or to initiate interaction.  Function - Problem Solving Problem solving assist level: Solves basic 75 - 89% of the time/requires cueing 10 - 24% of the time  Function - Memory Memory assist level: Recognizes or recalls 75 - 89% of the time/requires cueing 10 - 24% of the time Patient normally able to recall (first 3 days only): Current season, Location of own room, Staff names and faces, That he or she is in a hospital  Medical Problem List and Plan: 1.  Left hemiplegia, limitations with self-care, difficulty with transfers secondary to right MCA CVA. Cont CIR PT, OT, speech.  2.  DVT Prophylaxis/Anticoagulation: Pharmaceutical: Lovenox 3. Pain Management: Left hip/groin pain may be spasms , plus minus.  Osteoarthritis. Check x-ray, Zanaflex at night, incidental finding of infrarenal AAA, ask VVS to eval,Dr Donzetta Matters 4. Mood: Team to provide ego support. Expressing anxiety about current situation. Has supportive family. LCSW to follow for evaluation and support.  5. Neuropsych: This patient is capable of making decisions on his own behalf. 6. Skin/Wound Care: routine pressure relief measures.  7. Fluids/Electrolytes/Nutrition: Monitor I/O. Check lytes in am. Encourage fluid intake.  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 9. T2DM: Hgb A1C- 10.5 --. Continue metformin 1064m bid. Was started on lantus  45 units daily at bedtime, with SSI for elevated BS--will consult RD for diabetic education and start education on insulin administration.  Will monitor BS ac/hs, goal 115 to 180,3/23  CBG (last 3)   Recent Labs  09/09/16 1120 09/09/16 1643 09/09/16 2100  GLUCAP 200* 119* 90    10. HTN: Monitor BP bid--on lisinopril 240mdaily.stabilizing in normal range 3/23 Vitals:   09/09/16 1245 09/10/16 0605  BP: 122/69 113/62  Pulse: 83 78  Resp: 18 18  Temp: 97.8 F (36.6 C) 97.4 F (36.3 C)   11. Leucocytosis: Monitor for signs of infection. UA negative. Afebrile,WBC 12.9 12. Polycythemia: Smokes 1.5 PPD. Expect this to remain elevated during hospitalization, On NicoDerm patch 13. COPD: SOB reported--CXR negative. Encourage tobacco cessation.  Managed without medications at this time.   14. Tobacco abuse: Counsel  14. Hypoalbuminemia, start pro-stat 15. Urinary retention,   BPH, trial of Flomax,  Reduce Urecholine 1087mID,improved, will d/c urecholine  LOS (Days) 16 A FACE TO FACE EVALUATION WAS PERFORMED  Alvia Jablonski E 09/10/2016, 6:42 AM

## 2016-09-10 NOTE — Progress Notes (Signed)
Physical Therapy Session Note  Patient Details  Name: Chad Combs MRN: [id number] Date of Birth: 07/31/1955  Today's Date: 09/10/2016 PT Individual Time: 1319-1400 PT Individual Time Calculation (min): 41 min   Short Term Goals: Week 3:  PT Short Term Goal 1 (Week 3): pt will transfer to R squat pivot with supervision 75% of trials PT Short Term Goal 2 (Week 3): pt will perform gait with LRAD (not railing) x 20' with max assist PT Short Term Goal 3 (Week 3): pt will maintain standing balance during functional activity x 5 minutes with mod assist.  Skilled Therapeutic Interventions/Progress Updates:   Pt received supine in bed and agreeable to PT following CT scan. Supine>sit transfer with min assist and moderate cues for sequencing to utilize roll to side prior to sitting.   Squat pivot transfer training with min assist to L and R with moderate cues for LLE positioning to prevent injury and increased use of LE with transfer.   WC mobility with hemi technique and supevision assist from PT for attention to L through doorways.   Gait training at rail in hall x 20ft with max assist +2 for WC follow. Pt noted have quad activation in standing but unable to elicit hip flexion to advance the LLE.   Supine NMR hip abduction Adduciton, Hip flexion/extension, attempted ankle AROM, sidelying hip flexion/knee flexion x 15, knee extension in sidelying x 15, hip extension to neutral x 15. AAROM throughout in sidelying with gravity eliminated.   Pt returned to room and performed squat pivot transfer to bed with min assist. Sit>supine completed with min assist and moderate cues for LLE managament with the RLE and left supine in bed with call bell in reach and all needs met.        Therapy Documentation Precautions:  Precautions Precautions: Fall Precaution Comments: L sided neglect, shoulder increased risk for subluxation; quick LOB L wihtout awareness Restrictions Weight Bearing Restrictions:  No Vital Signs: Therapy Vitals Temp: 98.1 F (36.7 C) Temp Source: Oral Pulse Rate: 79 Resp: 18 BP: 111/64 Patient Position (if appropriate): Lying Oxygen Therapy SpO2: 96 % O2 Device: Not Delivered Pain: Pain Assessment Pain Assessment: No/denies pain   See Function Navigator for Current Functional Status.   Therapy/Group: Individual Therapy   E  09/10/2016, 3:22 PM  

## 2016-09-10 NOTE — Consult Note (Signed)
Hospital Consult    Reason for Consult:  AAA  Referring Physician:  Rehab MRN #:  [id number]  History of Present Illness: This is a 61 y.o. male recently admitted with right-sided stroke has left him with residual left upper extremity extremity weakness. 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 He denies any new abdominal or back pain today. He did have a father with aneurysm could not tell me whether this was fixed or not. He does not have a personal history of aneurysm. He has continued with rehabilitation and states he is plan to go home in a week or 2.  Past Medical History:  Diagnosis Date  . Blind right eye   . COPD (chronic obstructive pulmonary disease) ([place])   . Diabetes mellitus without complication ([place])   . Hypertension     Past Surgical History:  Procedure Laterality Date  . IVC FILTER INSERTION N/A 08/24/2016   Procedure: IVC Filter Insertion;  Surgeon: Katha Cabal, MD;  Location: [facility];  Service: Cardiovascular;  Laterality: N/A;    No Known Allergies  Prior to Admission medications   Medication Sig Start Date End Date Taking? Authorizing Provider  aspirin 81 MG chewable tablet Chew 1 tablet (81 mg total) by mouth daily. 08/26/16  Yes Vipul Manuella Ghazi, MD  atorvastatin (LIPITOR) 40 MG tablet Take 1 tablet (40 mg total) by mouth daily at 6 PM. 08/25/16  Yes Vipul Manuella Ghazi, MD  clopidogrel (PLAVIX) 75 MG tablet Take 1 tablet (75 mg total) by mouth daily. 08/26/16  Yes Vipul Manuella Ghazi, MD  fluticasone (FLONASE) 50 MCG/ACT nasal spray Place 1 spray into both nostrils daily. 08/25/16  Yes Vipul Manuella Ghazi, MD  Fluticasone-Salmeterol (ADVAIR) 100-50 MCG/DOSE AEPB Inhale 1 puff into  the lungs 2 (two) times daily.   Yes Historical Provider, MD  gabapentin (NEURONTIN) 100 MG capsule Take 100 mg by mouth 2 (two) times daily as needed.   Yes Historical Provider, MD  lisinopril (PRINIVIL,ZESTRIL) 20 MG tablet Take 20 mg by mouth daily.   Yes Historical Provider, MD  metFORMIN (GLUCOPHAGE) 500 MG tablet Take 1,000 mg by mouth 2 (two) times daily.    Yes Historical Provider, MD    Social History   Social History  . Marital status: Single    Spouse name: N/A  . Number of children: N/A  . Years of education: N/A   Occupational History  . Retired Administrator    Social History Main Topics  . Smoking status: Current Every Day Smoker    Packs/day: 1.50    Years: 45.00    Types: Cigarettes  . Smokeless tobacco: Never Used  . Alcohol use 0.0 oz/week     Comment: occasional (once every couple months)  . Drug use: No  . Sexual activity: No   Other Topics Concern  . Not on file   Social History Narrative  . No narrative on file     Family History  Problem Relation Age of Onset  . Cancer Mother   . Heart disease Father     ROS: [x]  Positive   [ ]  Negative   [ ]  All sytems reviewed and are negative  Cardiovascular: []  chest pain/pressure []  palpitations []  SOB lying flat []   DOE []  pain in legs while walking []  pain in legs at rest []  pain in legs at night []  non-healing ulcers []  hx of DVT []  swelling in legs  Pulmonary: []  productive cough []  asthma/wheezing []  home O2  Neurologic: [x]  weakness in [x]  arms [x]  legs []  numbness in []  arms []  legs [x]  hx of CVA []  mini stroke [] difficulty speaking or slurred speech [x]  loss of vision in one eye []  dizziness  Hematologic: []  hx of cancer []  bleeding problems []  problems with blood clotting easily  Endocrine:   []  diabetes []  thyroid disease  GI []  vomiting blood []  blood in stool  GU: []  CKD/renal failure []  HD--[]  M/W/F or []  T/T/S []  burning with urination []  blood in  urine  Psychiatric: []  anxiety []  depression  Musculoskeletal: []  arthritis []  joint pain  Integumentary: []  rashes []  ulcers  Constitutional: []  fever []  chills   Physical Examination  Vitals:   09/10/16 0605 09/10/16 1446  BP: 113/62 111/64  Pulse: 78 79  Resp: 18 18  Temp: 97.4 F (36.3 C) 98.1 F (36.7 C)   Body mass index is 29.91 kg/m.  General:  WDWN in NAD Gait: Not observed HENT: WNL, normocephalic Pulmonary: normal non-labored breathing Cardiac: palpable radial, femoral, popliteal pulses bilaterally Abdomen: soft and non tender, palpable pulsatile mass Skin: without rashes Extremities: without ischemic changes, without Gangrene , without cellulitis; without open wounds;  Musculoskeletal: no muscle wasting or atrophy  Neurologic: A&O X 3; Appropriate Affect ; SENSATION: normal; MOTOR FUNCTION:  moving all extremities equally. Speech is fluent/normal Psychiatric:  Appropriate mood and affect  CBC    Component Value Date/Time   WBC 12.9 (H) 09/08/2016 0952   RBC 5.36 09/08/2016 0952   HGB 17.9 (H) 09/08/2016 0952   HCT 52.5 (H) 09/08/2016 0952   PLT 275 09/08/2016 0952   MCV 97.9 09/08/2016 0952   MCH 33.4 09/08/2016 0952   MCHC 34.1 09/08/2016 0952   RDW 12.1 09/08/2016 0952   LYMPHSABS 3.2 08/26/2016 0602   MONOABS 1.0 08/26/2016 0602   EOSABS 0.1 08/26/2016 0602   BASOSABS 0.0 08/26/2016 0602    BMET    Component Value Date/Time   NA 138 09/08/2016 0952   K 3.8 09/08/2016 0952   CL 101 09/08/2016 0952   CO2 24 09/08/2016 0952   GLUCOSE 214 (H) 09/08/2016 0952   BUN 18 09/08/2016 0952   CREATININE 1.07 09/08/2016 0952   CALCIUM 9.8 09/08/2016 0952   GFRNONAA >60 09/08/2016 0952   GFRAA >60 09/08/2016 0952    COAGS: Lab Results  Component Value Date   INR 1.00 08/21/2016     Non-Invasive Vascular Imaging:   IMPRESSION: 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 CTA is recommended for further evaluation. Vascular  surgery consultation recommended due to increased risk of rupture for AAA >5.5 cm. This recommendation follows ACR consensus guidelines: White Paper of the ACR Incidental Findings Committee II on Vascular Findings. J Am Coll Radiol 2013; 10:789-794.   ASSESSMENT/PLAN: This is a 61 y.o. male admitted with stroke now in rehabilitation with 6.5 cm aneurysm on duplex. He does demonstrate family history of aneurysms well. This time we'll get CT angiogram chest and pelvis to further evaluate his full aorta as well as delineate his aneurysm. Discussed with him that he will likely need repair type of repair will depend on his aortic morphology. As long as aneurysm does not have signs of impending rupture likely plan is outpatient once  patient has fully recovered from his recent stroke. He does demonstrate good understanding that he woke to talk with a family member in the very near future along with the patient when we have the CT.   Siarah Deleo C. Donzetta Matters, MD [facility] Office: [phone] Pager: [phone]

## 2016-09-10 NOTE — Progress Notes (Signed)
Occupational Therapy Session Note  Patient Details  Name: Chad Combs MRN: [id number] Date of Birth: 1956-02-02  Today's Date: 09/10/2016 OT Individual Time: 3790-2409 OT Individual Time Calculation (min): 30 min    Short Term Goals: Week 3:  OT Short Term Goal 1 (Week 3): Pt will consistently complete transfer to toilet with mod A of 1 person A only. OT Short Term Goal 2 (Week 3): Pt will be able to pull pants over hips himself with min A to stabilize balance. OT Short Term Goal 3 (Week 3): Pt will be able to wash R and L leg arm using a long handled sponge with S. OT Short Term Goal 4 (Week 3): Pt will be able to wt shift to L side safely to wash bottom with lateral lean on tub bench with steadying A.  Skilled Therapeutic Interventions/Progress Updates:    Pt supine in bed during session.  Worked on [employer] exercises for the LUE.  Pt completed AAROM for left shoulder flexion 0-160 degrees for 2 sets of 10 repetitions.  Noted pt assisting once gravity was eliminated but still needed max facilitation secondary to hemiparesis.  Noted pt with increased tone in the internal rotators of the shoulder and the elbow flexors. Elbow flexion AAROM completed for 2 sets of 10 repetitions as well.  He was able to initiate active movement in both of these directions to command but decreased ability to activate elbow extensors to straighten out his elbow.  No active movement noted in digits or wrist.  Pt left in bed at end of session with call button and phone in reach.  Positioned LUE on pillows as well and set bed alarm.    Therapy Documentation Precautions:  Precautions Precautions: Fall Precaution Comments: L sided neglect, shoulder increased risk for subluxation; quick LOB L wihtout awareness Restrictions Weight Bearing Restrictions: No  Pain: Pain Assessment Pain Assessment: No/denies pain ADL: See Function Navigator for Current Functional Status.   Therapy/Group: Individual  Therapy  Chad Combs OTR/L 09/10/2016, 4:44 PM

## 2016-09-11 ENCOUNTER — Inpatient Hospital Stay (HOSPITAL_COMMUNITY): Payer: Self-pay | Admitting: Occupational Therapy

## 2016-09-11 ENCOUNTER — Inpatient Hospital Stay (HOSPITAL_COMMUNITY): Payer: Self-pay

## 2016-09-11 ENCOUNTER — Other Ambulatory Visit: Payer: Self-pay | Admitting: *Deleted

## 2016-09-11 DIAGNOSIS — I714 Abdominal aortic aneurysm, without rupture, unspecified: Secondary | ICD-10-CM

## 2016-09-11 DIAGNOSIS — Z01810 Encounter for preprocedural cardiovascular examination: Secondary | ICD-10-CM

## 2016-09-11 LAB — GLUCOSE, CAPILLARY
GLUCOSE-CAPILLARY: 109 mg/dL — AB (ref 65–99)
Glucose-Capillary: 167 mg/dL — ABNORMAL HIGH (ref 65–99)
Glucose-Capillary: 76 mg/dL (ref 65–99)
Glucose-Capillary: 105 mg/dL — ABNORMAL HIGH (ref 65–99)

## 2016-09-11 NOTE — Plan of Care (Signed)
Problem: RH Ambulation Goal: LTG Patient will ambulate in controlled environment (PT) LTG: Patient will ambulate in a controlled environment, # of feet with assistance (PT).  Down graded due to slow progress.  Goal: LTG Patient will ambulate in home environment (PT) LTG: Patient will ambulate in home environment, # of feet with assistance (PT).  Outcome: Not Applicable Date Met: 35/59/74 Down graded goal due to slow progress.

## 2016-09-11 NOTE — Progress Notes (Signed)
Occupational Therapy Session Note  Patient Details  Name: Chad Combs MRN: [id number] Date of Birth: February 06, 1956  Today's Date: 09/11/2016 OT Individual Time: 4332-9518 OT Individual Time Calculation (min): 50 min    Short Term Goals: Week 1:  OT Short Term Goal 1 (Week 1): Pt will maintain static sitting balance for at least 5 mins with supervision in preparation for selfcare tasks.  OT Short Term Goal 1 - Progress (Week 1): Met OT Short Term Goal 2 (Week 1): Pt will complete UB bathing with min assist in unsupported sitting.  OT Short Term Goal 2 - Progress (Week 1): Met OT Short Term Goal 3 (Week 1): Pt will complete LB bathing with max assist sit to stand.  OT Short Term Goal 3 - Progress (Week 1): Met OT Short Term Goal 4 (Week 1): Pt will donn pullover shirt in supported sitting with min assist 2 consecutive sessions.  OT Short Term Goal 4 - Progress (Week 1): Met OT Short Term Goal 5 (Week 1): Pt will donn pullup pants with max assist sit to stand for 2 consecutive sessions.  OT Short Term Goal 5 - Progress (Week 1): Met  Skilled Therapeutic Interventions/Progress Updates: Patient eating upon approach for therapy - leaning left.   This clinician helped reposition patient and table so that patient was positioned into neutral alignment and sitting upright and table lowered for safer positioning.   Patient was able to maintain the position as he finished his breakfast.   He ately slowly but safely.  He stated he was fatigued and did not rest well over late night and stated he was too fatigued to get OOB and wanted to rely back and rest.   Still he concurred to complete bathing supine in bed.    He required overall moderate assist for bathing and supported again back of bed with head of bed elevated.  He exhibited left in attention but once reminded that his water and basin were in his left environment, he was able to cross midline dip and squeeze wash cloth.   He required extra time to  squeeze the water out of his washcloth with his right hand.    Before completing buttock bathing and before dressing, he stated he needed to have BM.   This clinican and the nurse tech assisted him to [place].   He was able to complete supine with head of bed elevated to EOB transfer with Mod assist of one person.  By steadying himself at times with his right hand, sitting edge of bed, He was able to maintain static sitting balance EOB with close Supervision in prep for sit to stand to [facility] for approximately 5 minutes while this clinician and nurse tech set up the equipment and bed and moved items out of the way.   He completed EOB to [facility] transfer with Min A with help for left leg safe placement within the [facility] once seated.   He maintained neutral upright positioning within the [place] this session.  He required Min A x1 for self lowering to toilet for transfer.  He was left in the hands of his supportive tech tech completing toileting upon the end of this OT session.  Continue with primary OT     Therapy Documentation Precautions:  Precautions Precautions: Fall Precaution Comments: L sided neglect, shoulder increased risk for subluxation; quick LOB L wihtout awareness Restrictions Weight Bearing Restrictions: No  Pain:denied    See Function Navigator for Current Functional Status.  Therapy/Group: Individual Therapy  Alfredia Ferguson [facility] 09/11/2016, 9:19 AM

## 2016-09-11 NOTE — Progress Notes (Signed)
Subjective/Complaints: Comfortable, no complaints  ROS: pt denies nausea, vomiting, diarrhea, cough, shortness of breath or chest pain     Objective: Vital Signs: Blood pressure (!) 139/58, pulse 78, temperature 98.1 F (36.7 C), temperature source Oral, resp. rate 18, height 5' 9"  (1.753 m), weight 91.9 kg (202 lb 8.6 oz), SpO2 94 %. US Abdominal Aorta Screening Aaa  Result Date: 09/09/2016 CLINICAL DATA:  History of stroke, screen for aneurysm EXAM: ULTRASOUND OF ABDOMINAL AORTA TECHNIQUE: Ultrasound examination of the abdominal aorta was performed to evaluate for abdominal aortic aneurysm. COMPARISON:  None. FINDINGS: Abdominal Aorta Diffuse wall irregularity, consistent with atherosclerotic vascular disease. Probable mural thrombus at the mid aortic segment. Maximum Diameter: Proximal aorta:  3.4 cm AP x 2.9 cm transverse Mid aorta:  6.5 cm AP by 5.9 cm transverse Distal aorta:  2.8 cm AP x 3.2 cm transverse. 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 CTA is recommended for further evaluation. Vascular surgery consultation recommended due to increased risk of rupture for AAA >5.5 cm. This recommendation follows ACR consensus guidelines: White Paper of the ACR Incidental Findings Committee II on Vascular Findings. J Am Coll Radiol 2013; 10:789-794. These results will be called to the ordering clinician or representative by the Radiologist Assistant, and communication documented in the PACS or zVision Dashboard. Electronically Signed   By: Donavan Foil M.D.   On: 09/09/2016 22:33   Ct Angio Chest/abd/pel For Dissection W And/or W/wo  Result Date: 09/10/2016 CLINICAL DATA:  Infrarenal abdominal aortic aneurysm on screening study . Hx of:IVC FilterHTN DMCOPD . EXAM: CT ANGIOGRAPHY CHEST, ABDOMEN AND PELVIS TECHNIQUE: Multidetector CT imaging through the chest,  abdomen and pelvis was performed using the standard protocol during bolus administration of intravenous contrast. Multiplanar reconstructed images and MIPs were obtained and reviewed to evaluate the vascular anatomy. CONTRAST:  100 mL Isovue 370 IV COMPARISON:  Ultrasound 09/09/2016 FINDINGS: CTA CHEST FINDINGS Cardiovascular: There is good contrast opacification of pulmonary artery branches. There is a single segmental filling defect in the medial basal segment right lower lobe branch of the pulmonary artery image 88/7 consistent with acute/ subacute PE. There is no dilatation of the right ventricle. Scattered coronary calcifications. There is good contrast opacification of the thoracic aorta with no evidence of dissection or stenosis. Classic 3 vessel brachiocephalic arterial origin anatomy without proximal stenosis. There is a penetrating atheromatous ulcer in the distal aortic arch just beyond the left subclavian origin associated with a saccular 3.1 cm aneurysmal dilatation, containing some irregular mural thrombus. The distal descending thoracic segment is unremarkable. Mediastinum/Nodes: No enlarged mediastinal, hilar, or axillary lymph nodes. Thyroid gland, trachea, and esophagus demonstrate no significant findings. Lungs/Pleura: Emphysematous changes most marked in the apices. No airspace infiltrate. No pleural effusion. No pneumothorax. Musculoskeletal: No chest wall abnormality. No acute or significant osseous findings. Old fracture deformity of the left eleventh rib. Review of the MIP images confirms the above findings. CTA ABDOMEN AND PELVIS FINDINGS VASCULAR Aorta: Scattered atheromatous plaque in the suprarenal segment with mild mural thrombus, no dissection or stenosis. Fusiform aneurysm of the infrarenal aorta extending across the bifurcation measuring 6.8 cm transverse diameter the just above the bifurcation. There is a large amount of mural thrombus within the aneurysm. No retroperitoneal  hematoma. Celiac: Proximal short-segment stenosis of the mid level of the median arcuate ligament of the  diaphragm, patent distally. SMA: Patent without evidence of aneurysm, dissection, vasculitis or significant stenosis. Renals: Both renal arteries are patent without evidence of aneurysm, dissection, vasculitis, fibromuscular dysplasia or significant stenosis. IMA: Short segment origin stenosis, reconstituted distally by visceral collaterals. Inflow: Contiguous extension of the aortic aneurysm into the common iliac artery is, extending only a short distance on the left in measure in 3.2 cm diameter, and extending nearly to the bifurcation of the right common iliac artery, measuring up to 4.2 cm diameter, tapering to a diameter of 19 mm just above the bifurcation. Calcified eccentric plaque through bilateral external iliac artery is resulting in tandem areas of mild stenosis. Visualized proximal arterial outflow unremarkable bilaterally. Veins: Dedicated venous phase imaging not obtained. Infrarenal IVC filter is in expected location. Review of the MIP images confirms the above findings. NON-VASCULAR Hepatobiliary: No focal liver abnormality is seen. No gallstones, gallbladder wall thickening, or biliary dilatation. Pancreas: Unremarkable. No pancreatic ductal dilatation or surrounding inflammatory changes. Spleen: Normal in size without focal abnormality. Adrenals/Urinary Tract: Adrenal glands are unremarkable. Kidneys are normal, without renal calculi, focal lesion, or hydronephrosis. Urinary bladder is physiologically distended There is curvilinear layering high attenuation material along the left posterolateral aspect of the urinary bladder wall. Stomach/Bowel: Stomach mildly distended by ingested material. Small bowel nondilated. Appendix not identified. The colon is nondilated, unremarkable. Lymphatic: No abdominal or pelvic adenopathy localized. Reproductive: Prostatic enlargement protruding into the lumen  of the urinary bladder. Other: No ascites.  No free air. Musculoskeletal: Degenerative disc disease L5-S1. Negative for fracture or worrisome bone lesion. Review of the MIP images confirms the above findings. IMPRESSION: 1. Solitary acute PE in the medial basal segment right lower lobe pulmonary artery branch. 2. 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 3. 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 4. Infrarenal retrievable IVC filter. 5. Curvilinear calcifications along the left posterolateral urinary bladder wall may represent small layering calculi versus partially calcified bladder wall lesion. Electronically Signed   By: Lucrezia Europe M.D.   On: 09/10/2016 14:28   Dg Hip Unilat With Pelvis 2-3 Views Left  Result Date: 09/09/2016 CLINICAL DATA:  61 year old male with left hip pain. No known injury. EXAM: DG HIP (WITH OR WITHOUT PELVIS) 2-3V LEFT COMPARISON:  None. FINDINGS: Femoral heads are normally located. Hip joint spaces are symmetric and normal. Bone mineralization is within normal limits for age. The pelvis is intact. Sacral ala and SI joints appear normal. Grossly intact proximal right femur. Proximal left femur is intact and appears normal. There is curvilinear eggshell type calcification projecting over the lower lumbar spine encompassing an area of up to 7 cm (arrows). This is nonspecific but appears to be vascular related. IMPRESSION: 1. Possible rim calcified large infrarenal abdominal aortic aneurysm. Follow-up Aortic Ultrasound might be the simplest way to evaluate further. Otherwise CTA abdomen and pelvis with contrast would be recommended. 2. Otherwise normal radiographic appearance of the left hip and pelvis. 3. These results will be called to the ordering clinician or representative by the Radiologist Assistant, and  communication documented in the PACS or zVision Dashboard. Electronically Signed   By: Genevie Ann M.D.   On: 09/09/2016 14:27   Results for orders placed or performed during the hospital encounter of 08/25/16 (from the past 72 hour(s))  Basic metabolic panel     Status: Abnormal  Collection Time: 09/08/16  9:52 AM  Result Value Ref Range   Sodium 138 135 - 145 mmol/L   Potassium 3.8 3.5 - 5.1 mmol/L   Chloride 101 101 - 111 mmol/L   CO2 24 22 - 32 mmol/L   Glucose, Bld 214 (H) 65 - 99 mg/dL   BUN 18 6 - 20 mg/dL   Creatinine, Ser 1.07 0.61 - 1.24 mg/dL   Calcium 9.8 8.9 - 10.3 mg/dL   GFR calc non Af Amer >60 >60 mL/min   GFR calc Af Amer >60 >60 mL/min    Comment: (NOTE) The eGFR has been calculated using the CKD EPI equation. This calculation has not been validated in all clinical situations. eGFR's persistently <60 mL/min signify possible Chronic Kidney Disease.    Anion gap 13 5 - 15  CBC     Status: Abnormal   Collection Time: 09/08/16  9:52 AM  Result Value Ref Range   WBC 12.9 (H) 4.0 - 10.5 K/uL   RBC 5.36 4.22 - 5.81 MIL/uL   Hemoglobin 17.9 (H) 13.0 - 17.0 g/dL   HCT 52.5 (H) 39.0 - 52.0 %   MCV 97.9 78.0 - 100.0 fL   MCH 33.4 26.0 - 34.0 pg   MCHC 34.1 30.0 - 36.0 g/dL   RDW 12.1 11.5 - 15.5 %   Platelets 275 150 - 400 K/uL  Glucose, capillary     Status: Abnormal   Collection Time: 09/08/16 11:42 AM  Result Value Ref Range   Glucose-Capillary 188 (H) 65 - 99 mg/dL  Glucose, capillary     Status: Abnormal   Collection Time: 09/08/16  4:41 PM  Result Value Ref Range   Glucose-Capillary 144 (H) 65 - 99 mg/dL  Glucose, capillary     Status: Abnormal   Collection Time: 09/08/16  9:49 PM  Result Value Ref Range   Glucose-Capillary 144 (H) 65 - 99 mg/dL  Glucose, capillary     Status: Abnormal   Collection Time: 09/09/16  6:46 AM  Result Value Ref Range   Glucose-Capillary 121 (H) 65 - 99 mg/dL  Glucose, capillary     Status: Abnormal   Collection Time: 09/09/16  11:20 AM  Result Value Ref Range   Glucose-Capillary 200 (H) 65 - 99 mg/dL   Comment 1 Notify RN   Glucose, capillary     Status: Abnormal   Collection Time: 09/09/16  4:43 PM  Result Value Ref Range   Glucose-Capillary 119 (H) 65 - 99 mg/dL   Comment 1 Notify RN   Glucose, capillary     Status: None   Collection Time: 09/09/16  9:00 PM  Result Value Ref Range   Glucose-Capillary 90 65 - 99 mg/dL  Glucose, capillary     Status: Abnormal   Collection Time: 09/10/16  7:02 AM  Result Value Ref Range   Glucose-Capillary 101 (H) 65 - 99 mg/dL   Comment 1 Notify RN   Glucose, capillary     Status: Abnormal   Collection Time: 09/10/16 11:30 AM  Result Value Ref Range   Glucose-Capillary 179 (H) 65 - 99 mg/dL  Glucose, capillary     Status: None   Collection Time: 09/10/16  4:22 PM  Result Value Ref Range   Glucose-Capillary 86 65 - 99 mg/dL  Glucose, capillary     Status: None   Collection Time: 09/10/16  9:26 PM  Result Value Ref Range   Glucose-Capillary 99 65 - 99 mg/dL  Glucose, capillary  Status: Abnormal   Collection Time: 09/11/16  6:37 AM  Result Value Ref Range   Glucose-Capillary 109 (H) 65 - 99 mg/dL     HEENT: normal Cardio: RRR Resp: CTA B/L and Unlabored GI: BS positive and Nontender, nondistended Extremity:  No edema Skin:   Intact Neuro:  Flat, Abnormal Sensory Reduced sensation to pinch in left upper extremity greater than left lower extremity. Intact sensation on the right, Abnormal Motor Motor is 5/5 in the right deltoid, biceps, triceps, grip, hip flexor, knee extensor, ankle dorsiflexor, trace left biceps and finger flexors, 2 minus, left hip, knee extensor synergy, 0 at the ankle, Abnormal FMC Ataxic/ dec FMC and Dysarthric Musc/Skel:  Other No pain with upper extremity or lower extremity range of motion Gen. no acute distress   Assessment/Plan: 1. Functional deficits secondary to Right MCA infarct with Left Hemiparesis which require 3+ hours per day  of interdisciplinary therapy in a comprehensive inpatient rehab setting. Physiatrist is providing close team supervision and 24 hour management of active medical problems listed below. Physiatrist and rehab team continue to assess barriers to discharge/monitor patient progress toward functional and medical goals. FIM: Function - Bathing Position: Shower Body parts bathed by patient: Left arm, Chest, Abdomen, Front perineal area, Right upper leg, Left upper leg, Right lower leg Body parts bathed by helper: Right arm, Buttocks, Left lower leg, Back Assist Level: Touching or steadying assistance(Pt > 75%)  Function- Upper Body Dressing/Undressing What is the patient wearing?: Pull over shirt/dress Pull over shirt/dress - Perfomed by patient: Thread/unthread right sleeve, Put head through opening, Thread/unthread left sleeve, Pull shirt over trunk Pull over shirt/dress - Perfomed by helper: Thread/unthread left sleeve, Pull shirt over trunk Assist Level: Supervision or verbal cues Function - Lower Body Dressing/Undressing What is the patient wearing?: Pants, Non-skid slipper socks Position: Wheelchair/chair at sink Underwear - Performed by helper: Thread/unthread right underwear leg, Thread/unthread left underwear leg, Pull underwear up/down Pants- Performed by patient: Thread/unthread right pants leg Pants- Performed by helper: Thread/unthread left pants leg, Pull pants up/down (pt able to pull pants up 75% of the way) Non-skid slipper socks- Performed by patient: Don/doff right sock Non-skid slipper socks- Performed by helper: Don/doff left sock Socks - Performed by patient: Don/doff left sock Shoes - Performed by patient: Don/doff right shoe Shoes - Performed by helper: Don/doff left shoe, Fasten right, Fasten left Assist for footwear: Dependant Assist for lower body dressing:  (Max assist)  Function - Toileting Toileting activity did not occur: No continent bowel/bladder  event Toileting steps completed by patient: Performs perineal hygiene, Adjust clothing prior to toileting Toileting steps completed by helper: Adjust clothing after toileting Toileting Assistive Devices: Grab bar or rail Assist level: Touching or steadying assistance (Pt.75%)  Function - Air cabin crew transfer activity did not occur:  (using urinal or incont., no BM) Toilet transfer assistive device: Elevated toilet seat/BSC over toilet, Grab bar Mechanical lift: Stedy Assist level to toilet: Moderate assist (Pt 50 - 74%/lift or lower) Assist level from toilet: Moderate assist (Pt 50 - 74%/lift or lower)  Function - Chair/bed transfer Chair/bed transfer method: Squat pivot Chair/bed transfer assist level: Moderate assist (Pt 50 - 74%/lift or lower) Chair/bed transfer assistive device: Walker Mechanical lift: Stedy Chair/bed transfer details: Manual facilitation for weight shifting, Verbal cues for precautions/safety, Verbal cues for technique, Manual facilitation for placement  Function - Locomotion: Wheelchair Will patient use wheelchair at discharge?: Yes Type: Manual Max wheelchair distance: 50 Assist Level: Touching or steadying assistance (Pt >  75%) Assist Level: Supervision or verbal cues Assist Level: Supervision or verbal cues Turns around,maneuvers to table,bed, and toilet,negotiates 3% grade,maneuvers on rugs and over doorsills: No Function - Locomotion: Ambulation Ambulation activity did not occur: Safety/medical concerns (pt pushes toward hemi side; unsafe) Assistive device: Rail in hallway, Orthosis Max distance: 10 Assist level: 2 helpers Walk 10 feet activity did not occur: Safety/medical concerns Assist level: 2 helpers Walk 50 feet with 2 turns activity did not occur: Safety/medical concerns Walk 150 feet activity did not occur: Safety/medical concerns Walk 10 feet on uneven surfaces activity did not occur: Safety/medical concerns  Function -  Comprehension Comprehension: Auditory Comprehension assist level: Understands complex 90% of the time/cues 10% of the time  Function - Expression Expression: Verbal Expression assist level: Expresses basic needs/ideas: With no assist  Function - Social Interaction Social Interaction assist level: Interacts appropriately 75 - 89% of the time - Needs redirection for appropriate language or to initiate interaction.  Function - Problem Solving Problem solving assist level: Solves basic 50 - 74% of the time/requires cueing 25 - 49% of the time  Function - Memory Memory assist level: Recognizes or recalls 75 - 89% of the time/requires cueing 10 - 24% of the time Patient normally able to recall (first 3 days only): Current season, Location of own room, Staff names and faces, That he or she is in a hospital  Medical Problem List and Plan: 1.  Left hemiplegia, limitations with self-care, difficulty with transfers secondary to right MCA CVA. Cont CIR PT, OT, speech therapies.  2.  DVT Prophylaxis/Anticoagulation: Pharmaceutical: Lovenox 3. Pain Management: Left hip/groin pain may be spasms , plus minus. Osteoarthritis. Check x-ray, Zanaflex at night  4. Mood: Team to provide ego support. Expressing anxiety about current situation. Has supportive family. LCSW to follow for evaluation and support.  5. Neuropsych: This patient is capable of making decisions on his own behalf. 6. Skin/Wound Care: routine pressure relief measures.  7. Fluids/Electrolytes/Nutrition: Monitor I/O. Check lytes in am. Encourage fluid intake.  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 9. T2DM: Hgb A1C- 10.5 --. Continue metformin 1046m bid. Was started on lantus  45 units daily at bedtime, with SSI for elevated BS--will consult RD for diabetic education and start education on insulin administration.  Will monitor BS ac/hs, goal 115 to 180,3/23  CBG (last 3)    Recent Labs  09/10/16 1622 09/10/16 2126 09/11/16 0637  GLUCAP 86 99 109*    10. HTN: Monitor BP bid--on lisinopril 277mdaily.stabilizing in normal range 3/23 Vitals:   09/10/16 1446 09/11/16 0355  BP: 111/64 (!) 139/58  Pulse: 79 78  Resp: 18 18  Temp: 98.1 F (36.7 C) 98.1 F (36.7 C)   11. Leucocytosis: Monitor for signs of infection. UA negative. Afebrile,WBC 12.9 12. Polycythemia: Smokes 1.5 PPD. Expect this to remain elevated during hospitalization, On NicoDerm patch 13. COPD: SOB reported--CXR negative. Encourage tobacco cessation.  Managed without medications at this time.   14. Tobacco abuse: Counsel  14. Hypoalbuminemia, start pro-stat 15. Urinary retention,   BPH, trial of Flomax,  Urecholine stopped 16. Aortic aneursym infrarenal 6.8cm---Dr. CaDonzetta Mattersrdered CT angiogram of chest to determine whether aneurysm needs to be treated more acutely----await VVS input   LOS (Days) 17 A FACE TO FACE EVALUATION WAS PERFORMED  SWARTZ,ZACHARY T 09/11/2016, 8:41 AM

## 2016-09-11 NOTE — Progress Notes (Signed)
  Progress Note    09/11/2016 12:32 PM * No surgery found *  Subjective:  No acute issues  Vitals:   09/10/16 1446 09/11/16 0355  BP: 111/64 (!) 139/58  Pulse: 79 78  Resp: 18 18  Temp: 98.1 F (36.7 C) 98.1 F (36.7 C)    Physical Exam: aaox3 Non labored respirations Abdomen is soft, palpable abdominal mass Palpable femoral pulses  CBC    Component Value Date/Time   WBC 12.9 (H) 09/08/2016 0952   RBC 5.36 09/08/2016 0952   HGB 17.9 (H) 09/08/2016 0952   HCT 52.5 (H) 09/08/2016 0952   PLT 275 09/08/2016 0952   MCV 97.9 09/08/2016 0952   MCH 33.4 09/08/2016 0952   MCHC 34.1 09/08/2016 0952   RDW 12.1 09/08/2016 0952   LYMPHSABS 3.2 08/26/2016 0602   MONOABS 1.0 08/26/2016 0602   EOSABS 0.1 08/26/2016 0602   BASOSABS 0.0 08/26/2016 0602    BMET    Component Value Date/Time   NA 138 09/08/2016 0952   K 3.8 09/08/2016 0952   CL 101 09/08/2016 0952   CO2 24 09/08/2016 0952   GLUCOSE 214 (H) 09/08/2016 0952   BUN 18 09/08/2016 0952   CREATININE 1.07 09/08/2016 0952   CALCIUM 9.8 09/08/2016 0952   GFRNONAA >60 09/08/2016 0952   GFRAA >60 09/08/2016 0952    INR    Component Value Date/Time   INR 1.00 08/21/2016 2056     Intake/Output Summary (Last 24 hours) at 09/11/16 1232 Last data filed at 09/11/16 0900  Gross per 24 hour  Intake              270 ml  Output              350 ml  Net              -80 ml     Assessment/plan:  61 y.o. 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 Chad Combs will need to have this repaired but unfortunately has been hospitalized for the past month. I discussed with Chad Combs and his son (poa) this morning and we will have him f/u in 1 month to discuss repair.     Chad Combs C. Donzetta Matters, MD [facility] Office: [phone] Pager: [phone]  09/11/2016 12:32 PM

## 2016-09-11 NOTE — Progress Notes (Signed)
Occupational Therapy Session Note  Patient Details  Name: Chad Combs MRN: [id number] Date of Birth: 12-12-1955  Today's Date: 09/11/2016 OT Individual Time: 1300-1345 OT Individual Time Calculation (min): 45 min   Short Term Goals: Week 3:  OT Short Term Goal 1 (Week 3): Pt will consistently complete transfer to toilet with mod A of 1 person A only. OT Short Term Goal 2 (Week 3): Pt will be able to pull pants over hips himself with min A to stabilize balance. OT Short Term Goal 3 (Week 3): Pt will be able to wash R and L leg arm using a long handled sponge with S. OT Short Term Goal 4 (Week 3): Pt will be able to wt shift to L side safely to wash bottom with lateral lean on tub bench with steadying A.  Skilled Therapeutic Interventions/Progress Updates: ADL-retraining with focus on improved self-feeding, grooming, and reinforcement of upper body dressing using hemi technique.  Pt received seated and self-feeding using plastic silverware.   OT educated pt on use of light wrist weight to dampen tremors which improved pt's [place] during tasks.    Pt then requested assist with shaving using safety razor and completed 50%, just the right side of his face, with min assist for thoroughness (despite mild tremor).   OT finished shaving pt.   Pt then completed oral care with setup and changed his gown (soiled by food spillage) with repeated demonstration of hemi dressing technique to don gown over left UE.    Pt remained in w/c throughout session however completed sit<>stand with steadying assist to allow OT to pull up his pants which were lowered prior to treatment by RN tech to enable assist with toileting.   Pt left in w/c at end of session with all needs within reach and QRB applied for safety.      Therapy Documentation Precautions:  Precautions Precautions: Fall Precaution Comments: L sided neglect, shoulder increased risk for subluxation; quick LOB L wihtout awareness Restrictions Weight Bearing  Restrictions: No   Pain: No/denies pain     See Function Navigator for Current Functional Status.   Therapy/Group: Individual Therapy  [place] 09/11/2016, 4:05 PM

## 2016-09-12 ENCOUNTER — Inpatient Hospital Stay (HOSPITAL_COMMUNITY): Payer: Self-pay

## 2016-09-12 LAB — GLUCOSE, CAPILLARY
GLUCOSE-CAPILLARY: 128 mg/dL — AB (ref 65–99)
GLUCOSE-CAPILLARY: 180 mg/dL — AB (ref 65–99)
Glucose-Capillary: 94 mg/dL (ref 65–99)

## 2016-09-12 NOTE — Progress Notes (Signed)
   09/12/16 1315  What Happened  Was fall witnessed? Yes  Who witnessed fall? Gilman Buttner, NT  Patients activity before fall bathroom-assisted  Point of contact buttocks  Was patient injured? Unsure  Found by Staff-comment  Stated prior activity other (comment) (from toilet to wheelchair)

## 2016-09-12 NOTE — Progress Notes (Signed)
Patient in bathroom approximately 1300 with son. NT Lateneesha entered room to complete vitals. NT noted patient on commode and attempted to transfer back to wheelchair. NT reported patient slid off wheelchair seat with chair alarm pad in place and slid onto foot plate with NT and patient's son attempting to break fall holding patient under arms. Patient reported to RN left buttocks was sore. Redness noted to left buttocks skin intact. Dr. Naaman Plummer notified. No new orders at this time. Instructed family to allow staff to transfer patient. Continue with plan of care.  Mliss Sax

## 2016-09-12 NOTE — Progress Notes (Signed)
Subjective/Complaints:   No new complaints. Slept well last night  ROS: pt denies nausea, vomiting, diarrhea, cough, shortness of breath or chest pain    Objective: Vital Signs: Blood pressure 121/66, pulse 76, temperature 97.9 F (36.6 C), temperature source Oral, resp. rate 18, height 5\' 9"  (1.753 m), weight 91.9 kg (202 lb 8.6 oz), SpO2 93 %. Ct Angio Chest/abd/pel For Dissection W And/or W/wo  Result Date: 09/10/2016 CLINICAL DATA:  Infrarenal abdominal aortic aneurysm on screening study . Hx of:IVC FilterHTN DMCOPD . EXAM: CT ANGIOGRAPHY CHEST, ABDOMEN AND PELVIS TECHNIQUE: Multidetector CT imaging through the chest, abdomen and pelvis was performed using the standard protocol during bolus administration of intravenous contrast. Multiplanar reconstructed images and MIPs were obtained and reviewed to evaluate the vascular anatomy. CONTRAST:  100 mL Isovue 370 IV COMPARISON:  Ultrasound 09/09/2016 FINDINGS: CTA CHEST FINDINGS Cardiovascular: There is good contrast opacification of pulmonary artery branches. There is a single segmental filling defect in the medial basal segment right lower lobe branch of the pulmonary artery image 88/7 consistent with acute/ subacute PE. There is no dilatation of the right ventricle. Scattered coronary calcifications. There is good contrast opacification of the thoracic aorta with no evidence of dissection or stenosis. Classic 3 vessel brachiocephalic arterial origin anatomy without proximal stenosis. There is a penetrating atheromatous ulcer in the distal aortic arch just beyond the left subclavian origin associated with a saccular 3.1 cm aneurysmal dilatation, containing some irregular mural thrombus. The distal descending thoracic segment is unremarkable. Mediastinum/Nodes: No enlarged mediastinal, hilar, or axillary lymph nodes. Thyroid gland, trachea, and esophagus demonstrate no significant findings. Lungs/Pleura: Emphysematous changes most marked in the  apices. No airspace infiltrate. No pleural effusion. No pneumothorax. Musculoskeletal: No chest wall abnormality. No acute or significant osseous findings. Old fracture deformity of the left eleventh rib. Review of the MIP images confirms the above findings. CTA ABDOMEN AND PELVIS FINDINGS VASCULAR Aorta: Scattered atheromatous plaque in the suprarenal segment with mild mural thrombus, no dissection or stenosis. Fusiform aneurysm of the infrarenal aorta extending across the bifurcation measuring 6.8 cm transverse diameter the just above the bifurcation. There is a large amount of mural thrombus within the aneurysm. No retroperitoneal hematoma. Celiac: Proximal short-segment stenosis of the mid level of the median arcuate ligament of the diaphragm, patent distally. SMA: Patent without evidence of aneurysm, dissection, vasculitis or significant stenosis. Renals: Both renal arteries are patent without evidence of aneurysm, dissection, vasculitis, fibromuscular dysplasia or significant stenosis. IMA: Short segment origin stenosis, reconstituted distally by visceral collaterals. Inflow: Contiguous extension of the aortic aneurysm into the common iliac artery is, extending only a short distance on the left in measure in 3.2 cm diameter, and extending nearly to the bifurcation of the right common iliac artery, measuring up to 4.2 cm diameter, tapering to a diameter of 19 mm just above the bifurcation. Calcified eccentric plaque through bilateral external iliac artery is resulting in tandem areas of mild stenosis. Visualized proximal arterial outflow unremarkable bilaterally. Veins: Dedicated venous phase imaging not obtained. Infrarenal IVC filter is in expected location. Review of the MIP images confirms the above findings. NON-VASCULAR Hepatobiliary: No focal liver abnormality is seen. No gallstones, gallbladder wall thickening, or biliary dilatation. Pancreas: Unremarkable. No pancreatic ductal dilatation or surrounding  inflammatory changes. Spleen: Normal in size without focal abnormality. Adrenals/Urinary Tract: Adrenal glands are unremarkable. Kidneys are normal, without renal calculi, focal lesion, or hydronephrosis. Urinary bladder is physiologically distended There is curvilinear layering high attenuation material along the left posterolateral  aspect of the urinary bladder wall. Stomach/Bowel: Stomach mildly distended by ingested material. Small bowel nondilated. Appendix not identified. The colon is nondilated, unremarkable. Lymphatic: No abdominal or pelvic adenopathy localized. Reproductive: Prostatic enlargement protruding into the lumen of the urinary bladder. Other: No ascites.  No free air. Musculoskeletal: Degenerative disc disease L5-S1. Negative for fracture or worrisome bone lesion. Review of the MIP images confirms the above findings. IMPRESSION: 1. Solitary acute PE in the medial basal segment right lower lobe pulmonary artery branch. 2. 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 3. 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 4. Infrarenal retrievable IVC filter. 5. Curvilinear calcifications along the left posterolateral urinary bladder wall may represent small layering calculi versus partially calcified bladder wall lesion. Electronically Signed   By: Lucrezia Europe M.D.   On: 09/10/2016 14:28   Results for orders placed or performed during the hospital encounter of 08/25/16 (from the past 72 hour(s))  Glucose, capillary     Status: Abnormal   Collection Time: 09/09/16 11:20 AM  Result Value Ref Range   Glucose-Capillary 200 (H) 65 - 99 mg/dL   Comment 1 Notify RN   Glucose, capillary     Status: Abnormal   Collection Time: 09/09/16  4:43 PM  Result Value Ref Range   Glucose-Capillary 119 (H) 65 - 99 mg/dL   Comment 1 Notify RN   Glucose,  capillary     Status: None   Collection Time: 09/09/16  9:00 PM  Result Value Ref Range   Glucose-Capillary 90 65 - 99 mg/dL  Glucose, capillary     Status: Abnormal   Collection Time: 09/10/16  7:02 AM  Result Value Ref Range   Glucose-Capillary 101 (H) 65 - 99 mg/dL   Comment 1 Notify RN   Glucose, capillary     Status: Abnormal   Collection Time: 09/10/16 11:30 AM  Result Value Ref Range   Glucose-Capillary 179 (H) 65 - 99 mg/dL  Glucose, capillary     Status: None   Collection Time: 09/10/16  4:22 PM  Result Value Ref Range   Glucose-Capillary 86 65 - 99 mg/dL  Glucose, capillary     Status: None   Collection Time: 09/10/16  9:26 PM  Result Value Ref Range   Glucose-Capillary 99 65 - 99 mg/dL  Glucose, capillary     Status: Abnormal   Collection Time: 09/11/16  6:37 AM  Result Value Ref Range   Glucose-Capillary 109 (H) 65 - 99 mg/dL  Glucose, capillary     Status: Abnormal   Collection Time: 09/11/16 11:52 AM  Result Value Ref Range   Glucose-Capillary 167 (H) 65 - 99 mg/dL  Glucose, capillary     Status: None   Collection Time: 09/11/16  4:14 PM  Result Value Ref Range   Glucose-Capillary 76 65 - 99 mg/dL  Glucose, capillary     Status: Abnormal   Collection Time: 09/11/16  9:40 PM  Result Value Ref Range   Glucose-Capillary 105 (H) 65 - 99 mg/dL     HEENT: normal Cardio: RRR Resp: CTA bilaterally GI: BS + Extremity:  No edema Skin:   Intact Neuro:  Flat, Abnormal Sensory Reduced sensation to pinch in left upper extremity greater than left lower extremity. Intact sensation on the right, Abnormal Motor Motor is 5/5 in the right deltoid, biceps, triceps, grip, hip flexor,  knee extensor, ankle dorsiflexor, trace left biceps and finger flexors, 2 minus, left hip, knee extensor synergy, 0 at the ankle, Abnormal FMC Ataxic/ dec FMC and Dysarthric Musc/Skel:  Other No pain with upper extremity or lower extremity range of motion Gen. no acute  distress   Assessment/Plan: 1. Functional deficits secondary to Right MCA infarct with Left Hemiparesis which require 3+ hours per day of interdisciplinary therapy in a comprehensive inpatient rehab setting. Physiatrist is providing close team supervision and 24 hour management of active medical problems listed below. Physiatrist and rehab team continue to assess barriers to discharge/monitor patient progress toward functional and medical goals. FIM: Function - Bathing Position: Bed Body parts bathed by patient: Left arm, Chest, Abdomen, Front perineal area, Right upper leg, Left upper leg Body parts bathed by helper: Buttocks, Right lower leg, Left lower leg, Back Assist Level: Touching or steadying assistance(Pt > 75%)  Function- Upper Body Dressing/Undressing What is the patient wearing?: Hospital gown, Button up shirt Pull over shirt/dress - Perfomed by patient: Thread/unthread right sleeve, Put head through opening, Thread/unthread left sleeve, Pull shirt over trunk Pull over shirt/dress - Perfomed by helper: Thread/unthread left sleeve, Pull shirt over trunk Button up shirt - Perfomed by patient: Thread/unthread right sleeve, Thread/unthread left sleeve Button up shirt - Perfomed by helper: Pull shirt around back, Button/unbutton shirt Assist Level: Supervision or verbal cues Function - Lower Body Dressing/Undressing Lower body dressing/undressing activity did not occur:  (did not occur with this clinician today) What is the patient wearing?: Pants, Non-skid slipper socks Position: Wheelchair/chair at sink Underwear - Performed by helper: Thread/unthread right underwear leg, Thread/unthread left underwear leg, Pull underwear up/down Pants- Performed by patient: Thread/unthread right pants leg Pants- Performed by helper: Thread/unthread left pants leg, Pull pants up/down (pt able to pull pants up 75% of the way) Non-skid slipper socks- Performed by patient: Don/doff right  sock Non-skid slipper socks- Performed by helper: Don/doff left sock Socks - Performed by patient: Don/doff left sock Shoes - Performed by patient: Don/doff right shoe Shoes - Performed by helper: Don/doff left shoe, Fasten right, Fasten left Assist for footwear: Dependant Assist for lower body dressing:  (Max assist)  Function - Toileting Toileting activity did not occur: No continent bowel/bladder event Toileting steps completed by patient: Adjust clothing prior to toileting Toileting steps completed by helper: Performs perineal hygiene, Adjust clothing after toileting Toileting Assistive Devices: Grab bar or rail Assist level: Touching or steadying assistance (Pt.75%)  Function - Air cabin crew transfer activity did not occur:  (using urinal or incont., no BM) Toilet transfer assistive device: Elevated toilet seat/BSC over toilet Mechanical lift: Stedy Assist level to toilet: Moderate assist (Pt 50 - 74%/lift or lower) Assist level from toilet: Moderate assist (Pt 50 - 74%/lift or lower)  Function - Chair/bed transfer Chair/bed transfer method: Squat pivot Chair/bed transfer assist level: Moderate assist (Pt 50 - 74%/lift or lower) Chair/bed transfer assistive device: Environmental manager lift: Stedy Chair/bed transfer details: Manual facilitation for weight shifting, Verbal cues for precautions/safety, Verbal cues for technique, Manual facilitation for placement  Function - Locomotion: Wheelchair Will patient use wheelchair at discharge?: Yes Type: Manual Max wheelchair distance: 50 Assist Level: Touching or steadying assistance (Pt > 75%) Assist Level: Supervision or verbal cues Assist Level: Supervision or verbal cues Turns around,maneuvers to table,bed, and toilet,negotiates 3% grade,maneuvers on rugs and over doorsills: No Function - Locomotion: Ambulation Ambulation activity did not occur: Safety/medical concerns (pt pushes toward hemi side; unsafe) Assistive  device: [employer]  in hallway, Orthosis Max distance: 10 Assist level: 2 helpers Walk 10 feet activity did not occur: Safety/medical concerns Assist level: 2 helpers Walk 50 feet with 2 turns activity did not occur: Safety/medical concerns Walk 150 feet activity did not occur: Safety/medical concerns Walk 10 feet on uneven surfaces activity did not occur: Safety/medical concerns  Function - Comprehension Comprehension: Auditory Comprehension assist level: Understands complex 90% of the time/cues 10% of the time  Function - Expression Expression: Verbal Expression assist level: Expresses complex 90% of the time/cues < 10% of the time  Function - Social Interaction Social Interaction assist level: Interacts appropriately 90% of the time - Needs monitoring or encouragement for participation or interaction.  Function - Problem Solving Problem solving assist level: Solves basic problems with no assist  Function - Memory Memory assist level: Recognizes or recalls 75 - 89% of the time/requires cueing 10 - 24% of the time Patient normally able to recall (first 3 days only): Current season, Location of own room, Staff names and faces, That he or she is in a hospital  Medical Problem List and Plan: 1.  Left hemiplegia, limitations with self-care, difficulty with transfers secondary to right MCA CVA. Cont CIR PT, OT, speech therapies.  2.  DVT Prophylaxis/Anticoagulation: Pharmaceutical: Lovenox 3. Pain Management: Left hip/groin pain may be spasms , plus minus. Osteoarthritis. Check x-ray, Zanaflex at night  4. Mood: Team to provide ego support. Expressing anxiety about current situation. Has supportive family. LCSW to follow for evaluation and support.  5. Neuropsych: This patient is capable of making decisions on his own behalf. 6. Skin/Wound Care: routine pressure relief measures.  7. Fluids/Electrolytes/Nutrition: Monitor I/O. Check lytes in am. Encourage fluid intake.  8. 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 9. T2DM: Hgb A1C- 10.5 --. Continue metformin 1000mg  bid. Was started on lantus  45 units daily at bedtime, with SSI for elevated BS--will consult RD for diabetic education and start education on insulin administration.  Will monitor BS ac/hs, goal 115 to 180---reasonable control at present    CBG (last 3)   Recent Labs  09/11/16 1152 09/11/16 1614 09/11/16 2140  GLUCAP 167* 76 105*    10. HTN: Monitor BP bid--on lisinopril 20mg  daily.stabilizing in normal range 3/23 Vitals:   09/11/16 0355 09/12/16 0536  BP: (!) 139/58 121/66  Pulse: 78 76  Resp: 18 18  Temp: 98.1 F (36.7 C) 97.9 F (36.6 C)   11. Leucocytosis: Monitor for signs of infection. UA negative. Afebrile,WBC 12.9 12. Polycythemia: Smokes 1.5 PPD. Expect this to remain elevated during hospitalization, On NicoDerm patch 13. COPD: SOB reported--CXR negative. Encourage tobacco cessation.  Managed without medications at this time.   14. Tobacco abuse: Counsel  14. Hypoalbuminemia, start pro-stat 15. Urinary retention,   BPH, trial of Flomax,  Urecholine stopped 16. 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 Donzetta Matters yesterday. Conservative mgt for now---he spoke to son at length. Dr. Donzetta Matters will follow up with patient in office once he's further recovered from CVA   LOS (Days) 18 A FACE TO FACE EVALUATION WAS PERFORMED  SWARTZ,ZACHARY T 09/12/2016, 9:26 AM

## 2016-09-12 NOTE — Progress Notes (Signed)
Occupational Therapy Session Note  Patient Details  Name: Chad Combs MRN: [id number] Date of Birth: June 23, 1955  Today's Date: 09/12/2016 OT Individual Time: 1300-1345 OT Individual Time Calculation (min): 45 min   Short Term Goals: Week 3:  OT Short Term Goal 1 (Week 3): Pt will consistently complete transfer to toilet with mod A of 1 person A only. OT Short Term Goal 2 (Week 3): Pt will be able to pull pants over hips himself with min A to stabilize balance. OT Short Term Goal 3 (Week 3): Pt will be able to wash R and L leg arm using a long handled sponge with S. OT Short Term Goal 4 (Week 3): Pt will be able to wt shift to L side safely to wash bottom with lateral lean on tub bench with steadying A.  Skilled Therapeutic Interventions/Progress Updates: ADL-retraining with focus on bed mobility, transfers (w/c and toilet), toileting, and NMR of LUE.   Pt received supine in bed with RN present and reporting need for change of brief d/t soiled from urine.   Pt completed 15 min of LUE AAROM for shoulder, elbow, wrist, with instructional cues to perform SROM with hands interlaced to reinforce sensory integration.   Pt then reported need for BM and requested assist to toilet from bed to doff brief and attempt BM.   After setup to place w/c, pt completed SPT transfers from bed and to/from toilet with overall mod assist and vc to sequence sit<>stand.   Pt required extra time to evacuate BM but was unproductive beyond flatulence and voiding urine.   Pt required max assist to don new brief and elected to remain seated in w/c while visiting with his son.       Therapy Documentation Precautions:  Precautions Precautions: Fall Precaution Comments: L sided neglect, shoulder increased risk for subluxation; quick LOB L wihtout awareness Restrictions Weight Bearing Restrictions: No   Vital Signs: Therapy Vitals Temp: 98 F (36.7 C) Temp Source: Oral Pulse Rate: 98 Resp: 16 BP: 117/76 Patient  Position (if appropriate): Lying Oxygen Therapy SpO2: 96 % O2 Device: Not Delivered   Pain: No/denies pain     Other Treatments:    See Function Navigator for Current Functional Status.   Therapy/Group: Individual Therapy  [place] 09/12/2016, 3:37 PM

## 2016-09-12 NOTE — Progress Notes (Signed)
   09/12/16 1611  Follow Up  MD notified Dr. Naaman Plummer  Time MD notified [phone]  Family notified Yes-comment (son assisting patient )  Time family notified 1300  Additional tests No  Simple treatment Other (comment) (assessed turned on side)  Progress note created (see row info) Yes

## 2016-09-13 ENCOUNTER — Inpatient Hospital Stay (HOSPITAL_COMMUNITY): Payer: Self-pay | Admitting: Occupational Therapy

## 2016-09-13 ENCOUNTER — Inpatient Hospital Stay (HOSPITAL_COMMUNITY): Payer: Self-pay | Admitting: Speech Pathology

## 2016-09-13 ENCOUNTER — Inpatient Hospital Stay (HOSPITAL_COMMUNITY): Payer: Self-pay

## 2016-09-13 ENCOUNTER — Encounter (HOSPITAL_COMMUNITY): Payer: Self-pay | Admitting: Psychology

## 2016-09-13 ENCOUNTER — Inpatient Hospital Stay (HOSPITAL_COMMUNITY): Payer: Self-pay | Admitting: *Deleted

## 2016-09-13 ENCOUNTER — Telehealth: Payer: Self-pay | Admitting: Vascular Surgery

## 2016-09-13 DIAGNOSIS — F4321 Adjustment disorder with depressed mood: Secondary | ICD-10-CM

## 2016-09-13 DIAGNOSIS — I714 Abdominal aortic aneurysm, without rupture, unspecified: Secondary | ICD-10-CM | POA: Insufficient documentation

## 2016-09-13 DIAGNOSIS — I2699 Other pulmonary embolism without acute cor pulmonale: Secondary | ICD-10-CM

## 2016-09-13 LAB — GLUCOSE, CAPILLARY
GLUCOSE-CAPILLARY: 115 mg/dL — AB (ref 65–99)
GLUCOSE-CAPILLARY: 157 mg/dL — AB (ref 65–99)
GLUCOSE-CAPILLARY: 110 mg/dL — AB (ref 65–99)
Glucose-Capillary: 103 mg/dL — ABNORMAL HIGH (ref 65–99)

## 2016-09-13 NOTE — Telephone Encounter (Signed)
Spoke to son Chad Combs, he is aware of both appts Cardio on 4/9 and OV BCC on 4/20   Mailed letter

## 2016-09-13 NOTE — Progress Notes (Signed)
Physical Therapy Session Note  Patient Details  Name: EUSEBIO BLAZEJEWSKI MRN: [id number] Date of Birth: 02/19/1956  Today's Date: 09/13/2016 PT Individual Time: 0930-0958 PT Individual Time Calculation (min): 28 min    Skilled Therapeutic Interventions/Progress Updates:    Session focused on bed mobility re-training, functional sitting balance EOB while donning shoes/AFO, and transfer to w/c. Neuro re-ed during bed mobility (rolling, bridging to pull up pants, and supine -> sit) and squat pivot transfer with focus on slowing and controlling movement, cues and facilitation for technique to come to EOB with activation at trunk and decrease pulling with RUE, postural control during mobility. Pt required mod assist for squat pivot transfer with cues for hand placement, technique and slowed movement. Pt with decreased awareness of L side of body throughout and cues for management of LUE during mobility needed. Handoff to neuropsychologist.   Therapy Documentation Precautions:  Precautions Precautions: Fall Precaution Comments: L sided neglect, shoulder increased risk for subluxation; quick LOB L wihtout awareness Restrictions Weight Bearing Restrictions: No Pain:  Denies pain.  See Function Navigator for Current Functional Status.   Therapy/Group: Individual Therapy  Canary Brim Ivory Broad, PT, DPT  09/13/2016, 10:07 AM

## 2016-09-13 NOTE — Progress Notes (Signed)
Subjective/Complaints:   Denies any chest pain, shortness of breath. Appreciate CT angiogram report as well as consultation from vascular surgery reviewed. No new leg pain  ROS: pt denies nausea, vomiting, diarrhea, cough, shortness of breath or chest pain    Objective: Vital Signs: Blood pressure 131/81, pulse 76, temperature 98.3 F (36.8 C), temperature source Oral, resp. rate 18, height 5\' 9"  (1.753 m), weight 91.9 kg (202 lb 8.6 oz), SpO2 94 %. No results found. Results for orders placed or performed during the hospital encounter of 08/25/16 (from the past 72 hour(s))  Glucose, capillary     Status: Abnormal   Collection Time: 09/10/16  7:02 AM  Result Value Ref Range   Glucose-Capillary 101 (H) 65 - 99 mg/dL   Comment 1 Notify RN   Glucose, capillary     Status: Abnormal   Collection Time: 09/10/16 11:30 AM  Result Value Ref Range   Glucose-Capillary 179 (H) 65 - 99 mg/dL  Glucose, capillary     Status: None   Collection Time: 09/10/16  4:22 PM  Result Value Ref Range   Glucose-Capillary 86 65 - 99 mg/dL  Glucose, capillary     Status: None   Collection Time: 09/10/16  9:26 PM  Result Value Ref Range   Glucose-Capillary 99 65 - 99 mg/dL  Glucose, capillary     Status: Abnormal   Collection Time: 09/11/16  6:37 AM  Result Value Ref Range   Glucose-Capillary 109 (H) 65 - 99 mg/dL  Glucose, capillary     Status: Abnormal   Collection Time: 09/11/16 11:52 AM  Result Value Ref Range   Glucose-Capillary 167 (H) 65 - 99 mg/dL  Glucose, capillary     Status: None   Collection Time: 09/11/16  4:14 PM  Result Value Ref Range   Glucose-Capillary 76 65 - 99 mg/dL  Glucose, capillary     Status: Abnormal   Collection Time: 09/11/16  9:40 PM  Result Value Ref Range   Glucose-Capillary 105 (H) 65 - 99 mg/dL  Glucose, capillary     Status: Abnormal   Collection Time: 09/12/16 11:46 AM  Result Value Ref Range   Glucose-Capillary 128 (H) 65 - 99 mg/dL  Glucose, capillary      Status: None   Collection Time: 09/12/16  4:39 PM  Result Value Ref Range   Glucose-Capillary 94 65 - 99 mg/dL  Glucose, capillary     Status: Abnormal   Collection Time: 09/12/16  9:03 PM  Result Value Ref Range   Glucose-Capillary 180 (H) 65 - 99 mg/dL     HEENT: normal Cardio: RRR Resp: CTA bilaterally GI: BS + Extremity:  No edema Skin:   Intact Neuro:  Flat, Abnormal Sensory Reduced sensation to pinch in left upper extremity greater than left lower extremity. Intact sensation on the right, Abnormal Motor Motor is 5/5 in the right deltoid, biceps, triceps, grip, hip flexor, knee extensor, ankle dorsiflexor, trace left biceps and finger flexors, 2 minus, left hip, knee extensor synergy, 0 at the ankle, Abnormal FMC Ataxic/ dec FMC and Dysarthric Musc/Skel:  Other No pain with upper extremity or lower extremity range of motion Gen. no acute distress   Assessment/Plan: 1. Functional deficits secondary to Right MCA infarct with Left Hemiparesis which require 3+ hours per day of interdisciplinary therapy in a comprehensive inpatient rehab setting. Physiatrist is providing close team supervision and 24 hour management of active medical problems listed below. Physiatrist and rehab team continue to assess barriers to discharge/monitor patient  progress toward functional and medical goals. FIM: Function - Bathing Position: Bed Body parts bathed by patient: Left arm, Chest, Abdomen, Front perineal area, Right upper leg, Left upper leg Body parts bathed by helper: Buttocks, Right lower leg, Left lower leg, Back Assist Level: Touching or steadying assistance(Pt > 75%)  Function- Upper Body Dressing/Undressing What is the patient wearing?: Hospital gown, Button up shirt Pull over shirt/dress - Perfomed by patient: Thread/unthread right sleeve, Put head through opening, Thread/unthread left sleeve, Pull shirt over trunk Pull over shirt/dress - Perfomed by helper: Thread/unthread left sleeve,  Pull shirt over trunk Button up shirt - Perfomed by patient: Thread/unthread right sleeve, Thread/unthread left sleeve Button up shirt - Perfomed by helper: Pull shirt around back, Button/unbutton shirt Assist Level: Supervision or verbal cues Function - Lower Body Dressing/Undressing Lower body dressing/undressing activity did not occur:  (did not occur with this clinician today) What is the patient wearing?: Pants, Non-skid slipper socks Position: Wheelchair/chair at sink Underwear - Performed by helper: Thread/unthread right underwear leg, Thread/unthread left underwear leg, Pull underwear up/down Pants- Performed by patient: Thread/unthread right pants leg Pants- Performed by helper: Thread/unthread left pants leg, Pull pants up/down (pt able to pull pants up 75% of the way) Non-skid slipper socks- Performed by patient: Don/doff right sock Non-skid slipper socks- Performed by helper: Don/doff left sock Socks - Performed by patient: Don/doff left sock Shoes - Performed by patient: Don/doff right shoe Shoes - Performed by helper: Don/doff left shoe, Fasten right, Fasten left Assist for footwear: Dependant Assist for lower body dressing:  (Max assist)  Function - Toileting Toileting activity did not occur: No continent bowel/bladder event Toileting steps completed by patient: Adjust clothing prior to toileting Toileting steps completed by helper: Adjust clothing prior to toileting, Performs perineal hygiene, Adjust clothing after toileting Toileting Assistive Devices: Grab bar or rail Assist level: Touching or steadying assistance (Pt.75%)  Function - Air cabin crew transfer activity did not occur:  (using urinal or incont., no BM) Toilet transfer assistive device: Elevated toilet seat/BSC over toilet, Grab bar Mechanical lift: Stedy Assist level to toilet: Moderate assist (Pt 50 - 74%/lift or lower) Assist level from toilet: Moderate assist (Pt 50 - 74%/lift or  lower)  Function - Chair/bed transfer Chair/bed transfer method: Stand pivot Chair/bed transfer assist level: Moderate assist (Pt 50 - 74%/lift or lower) Chair/bed transfer assistive device: Bedrails, Armrests Mechanical lift: Stedy Chair/bed transfer details: Manual facilitation for weight shifting, Verbal cues for precautions/safety, Verbal cues for technique, Manual facilitation for placement  Function - Locomotion: Wheelchair Will patient use wheelchair at discharge?: Yes Type: Manual Max wheelchair distance: 50 Assist Level: Touching or steadying assistance (Pt > 75%) Assist Level: Supervision or verbal cues Assist Level: Supervision or verbal cues Turns around,maneuvers to table,bed, and toilet,negotiates 3% grade,maneuvers on rugs and over doorsills: No Function - Locomotion: Ambulation Ambulation activity did not occur: Safety/medical concerns (pt pushes toward hemi side; unsafe) Assistive device: Rail in hallway, Orthosis Max distance: 10 Assist level: 2 helpers Walk 10 feet activity did not occur: Safety/medical concerns Assist level: 2 helpers Walk 50 feet with 2 turns activity did not occur: Safety/medical concerns Walk 150 feet activity did not occur: Safety/medical concerns Walk 10 feet on uneven surfaces activity did not occur: Safety/medical concerns  Function - Comprehension Comprehension: Auditory Comprehension assist level: Understands basic 90% of the time/cues < 10% of the time  Function - Expression Expression: Verbal Expression assist level: Expresses basic needs/ideas: With extra time/assistive device  Function -  Social Interaction Social Interaction assist level: Interacts appropriately with others with medication or extra time (anti-anxiety, antidepressant).  Function - Problem Solving Problem solving assist level: Solves basic 75 - 89% of the time/requires cueing 10 - 24% of the time  Function - Memory Memory assist level: Recognizes or recalls 75  - 89% of the time/requires cueing 10 - 24% of the time Patient normally able to recall (first 3 days only): Current season, Location of own room, Staff names and faces, That he or she is in a hospital  Medical Problem List and Plan: 1.  Left hemiplegia, limitations with self-care, difficulty with transfers secondary to right MCA CVA. Cont CIR PT, OT, speech therapies.  2.  DVT Prophylaxis/Anticoagulation: Pharmaceutical: Lovenox 3. Pain Management: Left hip/groin pain may be spasms , plus minus. Osteoarthritis. Check x-ray, Zanaflex at night  4. Mood: Team to provide ego support. Expressing anxiety about current situation. Has supportive family. LCSW to follow for evaluation and support.  5. Neuropsych: This patient is capable of making decisions on his own behalf. 6. Skin/Wound Care: routine pressure relief measures.  7. Fluids/Electrolytes/Nutrition: Monitor I/O. Check lytes in am. Encourage fluid intake.  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 9. T2DM: Hgb A1C- 10.5 --. Continue metformin 1000mg  bid. Was started on lantus  45 units daily at bedtime, with SSI for elevated BS--will consult RD for diabetic education and start education on insulin administration.  Will monitor BS ac/hs, goal 115 to 180---reasonable control at present 10.  Acute/subacute right lower lobe pulmonary embolism. Asymptomatic. Will check lower extremity Dopplers. Has IVC filter. Discussed with vascular surgery, who is in favor of anticoagulation, will need to check with neurology.   CBG (last 3)   Recent Labs  09/12/16 1146 09/12/16 1639 09/12/16 2103  GLUCAP 128* 94 180*    10. HTN: Monitor BP bid--on lisinopril 20mg  daily.stabilizing in normal range 3/23 Vitals:   09/12/16 2101 09/13/16 0608  BP:  131/81  Pulse: 80 76  Resp: 18 18  Temp:  98.3 F (36.8 C)   11. Leucocytosis: Monitor for signs of infection. UA negative. Afebrile,WBC 12.9 12.  Polycythemia: Smokes 1.5 PPD. Expect this to remain elevated during hospitalization, On NicoDerm patch 13. COPD: SOB reported--CXR negative. Encourage tobacco cessation.  Managed without medications at this time.   14. Tobacco abuse: Counsel  14. Hypoalbuminemia, start pro-stat 15. Urinary retention,   BPH, trial of Flomax,  Urecholine stopped no ICP for > 1wk 16. 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 Donzetta Matters yesterday. Conservative mgt for now---he spoke to son at length. Dr. Donzetta Matters will follow up with patient in office once he's further recovered from CVA 17.  RLL pulm art branch PE, subacute /acute, has IVC filter whih means either clot originated above filter or is very small  LOS (Days) 19 A FACE TO FACE EVALUATION WAS PERFORMED  Ugonna Keirsey E 09/13/2016, 6:11 AM

## 2016-09-13 NOTE — Progress Notes (Signed)
Recreational Therapy Session Note  Patient Details  Name: Chad Combs MRN: [id number] Date of Birth: Nov 13, 1955 Today's Date: 09/13/2016  Pain: session 1: no c/o    Session 2: c/o L shoulder pain, repositioning and kinesiotaping Skilled Therapeutic Interventions/Progress Updates: Sessions focused on dynamic sitting and standing balance during co-treat with PT & later OT.  Pt performed sit-stands with total assist +2 but could maintain static standing with min-total assist +2 with verbal, tactile and visual cues(use of mirror) and dynamic standing balance with mod assist-total assist +2 for weight shifting, equal weightbearing, trunk rotation, and knee control.  Pt sat EOM on dyna disc with contact guard assist, min verbal cues & use of mirror for visual aid for ball tap activity.    Therapy/Group: Co-Treatment  Marieliz Strang 09/13/2016, 3:40 PM

## 2016-09-13 NOTE — Telephone Encounter (Signed)
-----   Message from Mena Goes, RN sent at 09/11/2016  6:23 PM EDT ----- Regarding: 4 weeks and cardiology clearance   ----- Message ----- From: Waynetta Sandy, MD Sent: 09/11/2016  12:37 PM To: [facility] [address]  Chad Combs [id number] Sep 11, 1955  Inpatient level 1 Dx: AAA  f/u in 4 weeks for discussion of EVAR. Should see his cardiologist prior.

## 2016-09-13 NOTE — Progress Notes (Signed)
Speech Language Pathology Daily Session Note  Patient Details  Name: Chad Combs MRN: [id number] Date of Birth: [birth year]  Today's Date: 09/13/2016 SLP Individual Time: 1400-1500 SLP Individual Time Calculation (min): 60 min  Short Term Goals: Week 3: SLP Short Term Goal 1 (Week 3): Given Min A verbal cues, pt will recall 1 activity from previous therapy session during day.  SLP Short Term Goal 2 (Week 3): Pt will utilize compensatory intelligibility strategies with Min A verbal cues to achieve >90% intelligibility at the simple conversation level.  SLP Short Term Goal 3 (Week 3): Pt will demonstrate sustained attention in moderately distracting environment for ~30 minutes with Min A verbal cues.  SLP Short Term Goal 4 (Week 3): Pt will complete basic, familiar problem solving tasks with Mod A verbal cues.  SLP Short Term Goal 5 (Week 3): Pt will scan to left of his environment to locate items within basic, familiar tasks with Mod A verbal cues.  SLP Short Term Goal 6 (Week 3): Pt will demonstrate anticipatory awareness by identifying 3 activites that are safe to participate in within home environement with Mod A verbal cues.   Skilled Therapeutic Interventions: Skilled treatment session focused on cognition goals. SLP facilitated session by providing Mod A to Min A for familiar basic problem solving tasks. Pt required MIn A to Mod  A verbal cues to scan to left of environment during task and he continually moved items into right field of environment. Pt required supervision cues for sustained attention to task in moderately distracting environment for ~60 minutes. He was able to demonstrate selective attention with supervision cues during this time. SLP administered the [place] and pt obtained a score of 16 out of 22 points. A score of 18 or above is considered within average range. On previous administration pt obtained a score of 14. Pt with progress. Pt was returned to room, left upright in  wheelchair, safety belt, chair alarm on and all needs within reach.      Function:  Eating Eating   Modified Consistency Diet: Yes Eating Assist Level: Set up assist for;Supervision or verbal cues   Eating Set Up Assist For: Opening containers       Cognition Comprehension Comprehension assist level: Follows basic conversation/direction with no assist  Expression   Expression assist level: Expresses basic needs/ideas: With no assist  Social Interaction Social Interaction assist level: Interacts appropriately 75 - 89% of the time - Needs redirection for appropriate language or to initiate interaction.  Problem Solving Problem solving assist level: Solves basic problems with no assist  Memory Memory assist level: Recognizes or recalls 75 - 89% of the time/requires cueing 10 - 24% of the time    Pain    Therapy/Group: Individual Therapy  Nalleli Largent 09/13/2016, 4:18 PM

## 2016-09-13 NOTE — Progress Notes (Signed)
Occupational Therapy Session Note  Patient Details  Name: Chad Combs MRN: [id number] Date of Birth: 25-Feb-1956  Today's Date: 09/13/2016 OT Individual Time: 1300-1400 OT Individual Time Calculation (min): 60 min    Short Term Goals: Week 3:  OT Short Term Goal 1 (Week 3): Pt will consistently complete transfer to toilet with mod A of 1 person A only. OT Short Term Goal 2 (Week 3): Pt will be able to pull pants over hips himself with min A to stabilize balance. OT Short Term Goal 3 (Week 3): Pt will be able to wash R and L leg arm using a long handled sponge with S. OT Short Term Goal 4 (Week 3): Pt will be able to wt shift to L side safely to wash bottom with lateral lean on tub bench with steadying A.  Skilled Therapeutic Interventions/Progress Updates:    Pt seen for OT session focusing on functional standing balance/ endurance. Pt sitting up in w/c upon arrival, agreeable to tx session. He declined bathing/dressing task today.  In therapy gym, pt completed towel pushes using B UEs, focusing on shoulder ROM and core strengthening/ stability. Pt noted to have large shoulder subluxation on L UE, noted to be painful when attempting to stand with UE supported over therapist. Kinesiotape applied to L shoulder, educated regarding purpose and use of tape, pt voiced understanding. He completed x4 sit <> stands in STEADY with mirror placed in front of pt for visual feedback to facilitate midline standing. Pt with heavy reliance on steadying bar for balance, upgrading task to include standing without UE support, however, pt unable to maintain midline standing without use of UEs. Seated on therapy mat, completed x3 sit <> stands with +2 assist. Pt able to come into standing with min A, however, required max- +2 assist for static standing balance. Verbal and tactile cuing provided for weight shift and equal weightbearing through B LEs. He completed dynamic sitting balance on EOM seated on air disk,  mirror placed in front of pt and addressed midline sitting/ weight shift with pt able to more easily shift weight/ positioning on foam mat. Completed ball toss activity while seated on mat, required close guarding assist as pt with anterior and posterior LOB, however, was able to self correct each time.  He completed min A squat pivot transfer back to w/c at end of session. Hand off to SLP.   Therapy Documentation Precautions:  Precautions Precautions: Fall Precaution Comments: L sided neglect, shoulder increased risk for subluxation; quick LOB L wihtout awareness Restrictions Weight Bearing Restrictions: No  See Function Navigator for Current Functional Status.   Therapy/Group: Individual Therapy  Lewis, Monda Chastain C 09/13/2016, 2:39 PM

## 2016-09-13 NOTE — Progress Notes (Signed)
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 Patient does have history of LLE DVT 11/14 with follow up dopplers showing decrease in clot burden. Has been non-compliant with Eliquis due to cost of medication and did have IVC filter placed on 3/6 past admission for stroke. Discussed need/safety of anticoagulation with Dr. Donnetta Hutching who will have Dr. Donzetta Matters follow up for input. Repeat BLE dopplers ordered for monitoring.

## 2016-09-13 NOTE — Consult Note (Signed)
Patient:  Chad Combs   DOB: 06/10/1956  MR Number: [id number]  Location: [facility] [address] [id number] [place] [address] Dept: [phone] Loc: [phone]  Start: 9:50 End: 10:45  Provider/Observer:        Chief Complaint:     Adjustment issues due to cerebral   Reason For Service:     Patient referred to neuropsychology for consultation due to coping issues following second stroke.  They patient reports that he had his first stroke in 2015 with residual issues with right eye and vision in right eye.  He reports that he recovered a lot form first stroke and was living on his own.  He reports that his son tried to call him and he could not get up.  Remembers being unable to answerer phone and son kept calling and calling.  He reports he knew exactly what was happening and was afraid he was going to die but could not do anything about it.  After ED and all the imaging show acute infarct in right parietal and temporal lobes due to right MCA infarct it was also found that he had blood clots in his left leg.  Put filter in leg due to clots.  The patient has been frustrated with loss of independence and functioning.  The patient appears to be doing better now that more of a plan for discharge and issues worked out with discharge planning.  Interventions Strategy:  Cognitive Behavioral and coping skills  Participation Level:   Active  Participation Quality:  Appropriate      Behavioral Observation:  Well Groomed, Lethargic, and Appropriate.   Current Psychosocial Factors: The patient now has a plan for going to live with his son and after one month will have hernia surgery done.  After that, he will go to live with other son.  The patient reports that he is still having concerns about how much of his life he will be able to get back to.  Content of Session:   Reviewed current symptoms and  adjustment/coping skills work.    Current Status:   The patient reports that he has been doing better with worry about his recovery and his discharge planning.  He has been able to work out plan about what is going to happen and this has helped his mood.  Patient Progress:   Very Good progress with adjustment to sudden change in functioning.  Last Reviewed:   09/13/2016  Goals Addressed Today:    Worked on coping skills around dealing with post stroke and fears of more strokes.  Impression/Diagnosis:   This is the second significant stroke for this patient.  First reported to have happened in 2015 with residual issues mainly due to loss of function in right eye.  Patient has now had a second stroke and more significant loss of motor function and changes in cognitive functioning.  He has had a very hard time dealing with loss of functioning.  He has had fears about further strokes and risk of death.

## 2016-09-13 NOTE — Progress Notes (Signed)
Physical Therapy Session Note  Patient Details  Name: Chad Combs MRN: [id number] Date of Birth: [birth year]  Today's Date: 09/13/2016 PT Individual Time: 1100-1200 PT Individual Time Calculation (min): 60 min   Short Term Goals: Week 3:  PT Short Term Goal 1 (Week 3): pt will transfer to R squat pivot with supervision 75% of trials PT Short Term Goal 2 (Week 3): pt will perform gait with LRAD (not railing) x 20' with max assist PT Short Term Goal 3 (Week 3): pt will maintain standing balance during functional activity x 5 minutes with mod assist.  Skilled Therapeutic Interventions/Progress Updates: Pt presented in w/c stating fatigue but agreeable to therapy. Propelled to rehab gym for increasing endurance. Performed squat pivot transfer to mat (R) with pt able to verbalize sequencing appropriately and performed with min guard. Using visual feedback via mirror to attempt midline orientation performed sit to stand with RW. Pt required modA for transfer and able to maintain standing up to 2 min. Performed standing tolerance activities with RT reaching and tossing horseshoes. PTA providing manual facilitation of RLE for quad activation. Pt maintained standing tolerance approx 2 min before fatigue. Pt returned to room and remained in w/c with QRB and chair alarm in place and needs met.      Therapy Documentation Precautions:  Precautions Precautions: Fall Precaution Comments: L sided neglect, shoulder increased risk for subluxation; quick LOB L wihtout awareness Restrictions Weight Bearing Restrictions: No General:   Vital Signs: Therapy Vitals Temp: 98.2 F (36.8 C) Temp Source: Oral Pulse Rate: 98 Resp: 18 BP: 121/72 Patient Position (if appropriate): Sitting Oxygen Therapy SpO2: 97 % O2 Device: Not Delivered Pain: Pain Assessment Pain Score: 3    See Function Navigator for Current Functional Status.   Therapy/Group: Individual Therapy  Izella Ybanez  [facility] Teegarden,  PTA  09/13/2016, 12:52 PM

## 2016-09-14 ENCOUNTER — Inpatient Hospital Stay (HOSPITAL_COMMUNITY): Payer: Self-pay | Admitting: Speech Pathology

## 2016-09-14 ENCOUNTER — Inpatient Hospital Stay (HOSPITAL_COMMUNITY): Payer: Self-pay

## 2016-09-14 ENCOUNTER — Inpatient Hospital Stay (HOSPITAL_COMMUNITY): Payer: Self-pay | Admitting: Occupational Therapy

## 2016-09-14 ENCOUNTER — Inpatient Hospital Stay (HOSPITAL_COMMUNITY): Payer: Self-pay | Admitting: *Deleted

## 2016-09-14 DIAGNOSIS — I629 Nontraumatic intracranial hemorrhage, unspecified: Secondary | ICD-10-CM

## 2016-09-14 DIAGNOSIS — I638 Other cerebral infarction: Secondary | ICD-10-CM

## 2016-09-14 DIAGNOSIS — Z86718 Personal history of other venous thrombosis and embolism: Secondary | ICD-10-CM

## 2016-09-14 LAB — GLUCOSE, CAPILLARY
GLUCOSE-CAPILLARY: 165 mg/dL — AB (ref 65–99)
GLUCOSE-CAPILLARY: 92 mg/dL (ref 65–99)
Glucose-Capillary: 119 mg/dL — ABNORMAL HIGH (ref 65–99)
Glucose-Capillary: 96 mg/dL (ref 65–99)

## 2016-09-14 NOTE — Progress Notes (Signed)
Subjective/Complaints:   Patient feels like his normal self this morning. Finishing up breakfast and getting ready for physical therapy. Discussed CT head and chest findings  ROS: pt denies nausea, vomiting, diarrhea, cough, shortness of breath or chest pain    Objective: Vital Signs: Blood pressure 118/67, pulse 79, temperature 97.9 F (36.6 C), temperature source Oral, resp. rate 16, height 5\' 9"  (1.753 m), weight 91.9 kg (202 lb 8.6 oz), SpO2 99 %. Ct Head Wo Contrast  Result Date: 09/14/2016 CLINICAL DATA:  Follow up cerebrovascular accident. Considering anticoagulation. Assess for hemorrhagic transformation. Initial encounter. EXAM: CT HEAD WITHOUT CONTRAST TECHNIQUE: Contiguous axial images were obtained from the base of the skull through the vertex without intravenous contrast. COMPARISON:  MRI of the brain performed 08/22/2016 FINDINGS: Brain: There has been interval evolution of the large right MCA territory infarct, with multiple new foci of acute intraparenchymal hemorrhage, measuring up to 1 cm in size. Scattered subarachnoid hemorrhage is also noted along the cortical edges of the infarct. Associated edema is noted, without evidence of midline shift at this time. There is no evidence of transtentorial herniation. The infarct involves much of the right basal ganglia. The brainstem and fourth ventricle are within normal limits. Vascular: No hyperdense vessel or unexpected calcification. Skull: There is no evidence of fracture; visualized osseous structures are unremarkable in appearance. Sinuses/Orbits: The orbits are within normal limits. The paranasal sinuses and mastoid air cells are well-aerated. Other: No significant soft tissue abnormalities are seen. IMPRESSION: Interval evolution of large right MCA territory infarct, with multiple new foci of acute intraparenchymal hemorrhage, measuring up to 1 cm in size. Scattered subarachnoid hemorrhage also noted along the cortical edges of the  infarct. Associated edema seen, without evidence of midline shift. No evidence of transtentorial herniation. Critical Value/emergent results were called by telephone at the time of interpretation on 09/14/2016 at 4:00 am to Danella Sensing NP, who verbally acknowledged these results. Electronically Signed   By: Garald Balding M.D.   On: 09/14/2016 04:03   Ct Angio Chest/abd/pel For Dissection W And/or W/wo  Result Date: 09/13/2016 CLINICAL DATA:  Infrarenal abdominal aortic aneurysm on screening study . Hx of:IVC FilterHTN DMCOPD . EXAM: CT ANGIOGRAPHY CHEST, ABDOMEN AND PELVIS TECHNIQUE: Multidetector CT imaging through the chest, abdomen and pelvis was performed using the standard protocol during bolus administration of intravenous contrast. Multiplanar reconstructed images and MIPs were obtained and reviewed to evaluate the vascular anatomy. CONTRAST:  100 mL Isovue 370 IV COMPARISON:  Ultrasound 09/09/2016 FINDINGS: CTA CHEST FINDINGS Cardiovascular: There is good contrast opacification of pulmonary artery branches. There is a single segmental filling defect in the medial basal segment right lower lobe branch of the pulmonary artery image 88/7 consistent with acute/ subacute PE. There is no dilatation of the right ventricle. Scattered coronary calcifications. There is good contrast opacification of the thoracic aorta with no evidence of dissection or stenosis. Classic 3 vessel brachiocephalic arterial origin anatomy without proximal stenosis. There is a penetrating atheromatous ulcer in the distal aortic arch just beyond the left subclavian origin associated with a saccular 3.1 cm aneurysmal dilatation, containing some irregular mural thrombus. The distal descending thoracic segment is unremarkable. Mediastinum/Nodes: No enlarged mediastinal, hilar, or axillary lymph nodes. Thyroid gland, trachea, and esophagus demonstrate no significant findings. Lungs/Pleura: Emphysematous changes most marked in the apices.  No airspace infiltrate. No pleural effusion. No pneumothorax. Musculoskeletal: No chest wall abnormality. No acute or significant osseous findings. Old fracture deformity of the left eleventh rib. Review of  the MIP images confirms the above findings. CTA ABDOMEN AND PELVIS FINDINGS VASCULAR Aorta: Scattered atheromatous plaque in the suprarenal segment with mild mural thrombus, no dissection or stenosis. Fusiform aneurysm of the infrarenal aorta extending across the bifurcation measuring 6.8 cm transverse diameter the just above the bifurcation. There is a large amount of mural thrombus within the aneurysm. No retroperitoneal hematoma. Celiac: Proximal short-segment stenosis of the mid level of the median arcuate ligament of the diaphragm, patent distally. SMA: Patent without evidence of aneurysm, dissection, vasculitis or significant stenosis. Renals: Both renal arteries are patent without evidence of aneurysm, dissection, vasculitis, fibromuscular dysplasia or significant stenosis. IMA: Short segment origin stenosis, reconstituted distally by visceral collaterals. Inflow: Contiguous extension of the aortic aneurysm into the common iliac artery is, extending only a short distance on the left in measure in 3.2 cm diameter, and extending nearly to the bifurcation of the right common iliac artery, measuring up to 4.2 cm diameter, tapering to a diameter of 19 mm just above the bifurcation. Calcified eccentric plaque through bilateral external iliac artery is resulting in tandem areas of mild stenosis. Visualized proximal arterial outflow unremarkable bilaterally. Veins: Dedicated venous phase imaging not obtained. Infrarenal IVC filter is in expected location. Review of the MIP images confirms the above findings. NON-VASCULAR Hepatobiliary: No focal liver abnormality is seen. No gallstones, gallbladder wall thickening, or biliary dilatation. Pancreas: Unremarkable. No pancreatic ductal dilatation or surrounding  inflammatory changes. Spleen: Normal in size without focal abnormality. Adrenals/Urinary Tract: Adrenal glands are unremarkable. Kidneys are normal, without renal calculi, focal lesion, or hydronephrosis. Urinary bladder is physiologically distended There is curvilinear layering high attenuation material along the left posterolateral aspect of the urinary bladder wall. Stomach/Bowel: Stomach mildly distended by ingested material. Small bowel nondilated. Appendix not identified. The colon is nondilated, unremarkable. Lymphatic: No abdominal or pelvic adenopathy localized. Reproductive: Prostatic enlargement protruding into the lumen of the urinary bladder. Other: No ascites.  No free air. Musculoskeletal: Degenerative disc disease L5-S1. Negative for fracture or worrisome bone lesion. Review of the MIP images confirms the above findings. IMPRESSION: 1. Solitary acute PE in the medial basal segment right lower lobe pulmonary artery branch. 2. 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 3. 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 4. Infrarenal retrievable IVC filter. 5. Curvilinear calcifications along the left posterolateral urinary bladder wall may represent small layering calculi versus partially calcified bladder wall lesion. Electronically Signed   By: Lucrezia Europe M.D.   On: 09/10/2016 14:28   Ct Angio Chest/abd/pel For Dissection W And/or W/wo  Result Date: 09/13/2016 CLINICAL DATA:  Infrarenal abdominal aortic aneurysm on screening study . Hx of:IVC FilterHTN DMCOPD . EXAM: CT ANGIOGRAPHY CHEST, ABDOMEN AND PELVIS TECHNIQUE: Multidetector CT imaging through the chest, abdomen and pelvis was performed using the standard protocol during bolus administration of intravenous contrast. Multiplanar reconstructed images and MIPs were obtained and  reviewed to evaluate the vascular anatomy. CONTRAST:  100 mL Isovue 370 IV COMPARISON:  Ultrasound 09/09/2016 FINDINGS: CTA CHEST FINDINGS Cardiovascular: There is good contrast opacification of pulmonary artery branches. There is a single segmental filling defect in the medial basal segment right lower lobe branch of the pulmonary artery image 88/7 consistent with acute/ subacute PE. There is no dilatation of the right ventricle. Scattered coronary calcifications. There is good contrast opacification of the thoracic aorta with no  evidence of dissection or stenosis. Classic 3 vessel brachiocephalic arterial origin anatomy without proximal stenosis. There is a penetrating atheromatous ulcer in the distal aortic arch just beyond the left subclavian origin associated with a saccular 3.1 cm aneurysmal dilatation, containing some irregular mural thrombus. The distal descending thoracic segment is unremarkable. Mediastinum/Nodes: No enlarged mediastinal, hilar, or axillary lymph nodes. Thyroid gland, trachea, and esophagus demonstrate no significant findings. Lungs/Pleura: Emphysematous changes most marked in the apices. No airspace infiltrate. No pleural effusion. No pneumothorax. Musculoskeletal: No chest wall abnormality. No acute or significant osseous findings. Old fracture deformity of the left eleventh rib. Review of the MIP images confirms the above findings. CTA ABDOMEN AND PELVIS FINDINGS VASCULAR Aorta: Scattered atheromatous plaque in the suprarenal segment with mild mural thrombus, no dissection or stenosis. Fusiform aneurysm of the infrarenal aorta extending across the bifurcation measuring 6.8 cm transverse diameter the just above the bifurcation. There is a large amount of mural thrombus within the aneurysm. No retroperitoneal hematoma. Celiac: Proximal short-segment stenosis of the mid level of the median arcuate ligament of the diaphragm, patent distally. SMA: Patent without evidence of aneurysm,  dissection, vasculitis or significant stenosis. Renals: Both renal arteries are patent without evidence of aneurysm, dissection, vasculitis, fibromuscular dysplasia or significant stenosis. IMA: Short segment origin stenosis, reconstituted distally by visceral collaterals. Inflow: Contiguous extension of the aortic aneurysm into the common iliac artery is, extending only a short distance on the left in measure in 3.2 cm diameter, and extending nearly to the bifurcation of the right common iliac artery, measuring up to 4.2 cm diameter, tapering to a diameter of 19 mm just above the bifurcation. Calcified eccentric plaque through bilateral external iliac artery is resulting in tandem areas of mild stenosis. Visualized proximal arterial outflow unremarkable bilaterally. Veins: Dedicated venous phase imaging not obtained. Infrarenal IVC filter is in expected location. Review of the MIP images confirms the above findings. NON-VASCULAR Hepatobiliary: No focal liver abnormality is seen. No gallstones, gallbladder wall thickening, or biliary dilatation. Pancreas: Unremarkable. No pancreatic ductal dilatation or surrounding inflammatory changes. Spleen: Normal in size without focal abnormality. Adrenals/Urinary Tract: Adrenal glands are unremarkable. Kidneys are normal, without renal calculi, focal lesion, or hydronephrosis. Urinary bladder is physiologically distended There is curvilinear layering high attenuation material along the left posterolateral aspect of the urinary bladder wall. Stomach/Bowel: Stomach mildly distended by ingested material. Small bowel nondilated. Appendix not identified. The colon is nondilated, unremarkable. Lymphatic: No abdominal or pelvic adenopathy localized. Reproductive: Prostatic enlargement protruding into the lumen of the urinary bladder. Other: No ascites.  No free air. Musculoskeletal: Degenerative disc disease L5-S1. Negative for fracture or worrisome bone lesion. Review of the MIP  images confirms the above findings. IMPRESSION: 1. Solitary acute PE in the medial basal segment right lower lobe pulmonary artery branch. 2. 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 3. 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 4. Infrarenal retrievable IVC filter. 5. Curvilinear calcifications along the left posterolateral urinary bladder wall may represent small layering calculi versus partially calcified bladder wall lesion. Electronically Signed   By: Lucrezia Europe M.D.   On: 09/10/2016 14:28   Results for orders placed or performed during the hospital encounter of 08/25/16 (from the past 72 hour(s))  Glucose, capillary     Status: Abnormal   Collection Time: 09/11/16  6:37 AM  Result Value Ref  Range   Glucose-Capillary 109 (H) 65 - 99 mg/dL  Glucose, capillary     Status: Abnormal   Collection Time: 09/11/16 11:52 AM  Result Value Ref Range   Glucose-Capillary 167 (H) 65 - 99 mg/dL  Glucose, capillary     Status: None   Collection Time: 09/11/16  4:14 PM  Result Value Ref Range   Glucose-Capillary 76 65 - 99 mg/dL  Glucose, capillary     Status: Abnormal   Collection Time: 09/11/16  9:40 PM  Result Value Ref Range   Glucose-Capillary 105 (H) 65 - 99 mg/dL  Glucose, capillary     Status: Abnormal   Collection Time: 09/12/16 11:46 AM  Result Value Ref Range   Glucose-Capillary 128 (H) 65 - 99 mg/dL  Glucose, capillary     Status: None   Collection Time: 09/12/16  4:39 PM  Result Value Ref Range   Glucose-Capillary 94 65 - 99 mg/dL  Glucose, capillary     Status: Abnormal   Collection Time: 09/12/16  9:03 PM  Result Value Ref Range   Glucose-Capillary 180 (H) 65 - 99 mg/dL  Glucose, capillary     Status: Abnormal   Collection Time: 09/13/16  6:35 AM  Result Value Ref Range   Glucose-Capillary 115 (H) 65 - 99 mg/dL   Glucose, capillary     Status: Abnormal   Collection Time: 09/13/16 12:16 PM  Result Value Ref Range   Glucose-Capillary 157 (H) 65 - 99 mg/dL  Glucose, capillary     Status: Abnormal   Collection Time: 09/13/16  4:31 PM  Result Value Ref Range   Glucose-Capillary 103 (H) 65 - 99 mg/dL  Glucose, capillary     Status: Abnormal   Collection Time: 09/13/16  9:47 PM  Result Value Ref Range   Glucose-Capillary 110 (H) 65 - 99 mg/dL     HEENT: normal Cardio: RRR Resp: CTA bilaterally GI: BS + Extremity:  No edema Skin:   Intact Neuro:  Flat, Abnormal Sensory Abnormal Motor Motor is 5/5 in the right deltoid, biceps, triceps, grip, hip flexor, knee extensor, ankle dorsiflexor, trace left biceps and finger flexors, 2 minus, left hip, knee extensor synergy, 0 at the ankle, Abnormal FMC Ataxic/ dec FMC and Dysarthric Musc/Skel:  Other No pain with upper extremity or lower extremity range of motion Gen. no acute distress   Assessment/Plan: 1. Functional deficits secondary to Right MCA infarct with Left Hemiparesis which require 3+ hours per day of interdisciplinary therapy in a comprehensive inpatient rehab setting. Physiatrist is providing close team supervision and 24 hour management of active medical problems listed below. Physiatrist and rehab team continue to assess barriers to discharge/monitor patient progress toward functional and medical goals. FIM: Function - Bathing Position: Bed Body parts bathed by patient: Left arm, Chest, Abdomen, Front perineal area, Right upper leg, Left upper leg Body parts bathed by helper: Buttocks, Right lower leg, Left lower leg, Back Assist Level: Touching or steadying assistance(Pt > 75%)  Function- Upper Body Dressing/Undressing What is the patient wearing?: Hospital gown, Button up shirt Pull over shirt/dress - Perfomed by patient: Thread/unthread right sleeve, Put head through opening, Thread/unthread left sleeve, Pull shirt over trunk Pull  over shirt/dress - Perfomed by helper: Thread/unthread left sleeve, Pull shirt over trunk Button up shirt - Perfomed by patient: Thread/unthread right sleeve, Thread/unthread left sleeve Button up shirt - Perfomed by helper: Pull shirt around back, Button/unbutton shirt Assist Level: Supervision or verbal cues Function - Lower Body Dressing/Undressing Lower body  dressing/undressing activity did not occur:  (did not occur with this clinician today) What is the patient wearing?: Pants, AFO, Shoes Position:  (pants supine and shoes EOB) Underwear - Performed by helper: Thread/unthread right underwear leg, Thread/unthread left underwear leg, Pull underwear up/down Pants- Performed by patient: Thread/unthread right pants leg Pants- Performed by helper: Thread/unthread left pants leg Non-skid slipper socks- Performed by patient: Don/doff right sock Non-skid slipper socks- Performed by helper: Don/doff left sock Socks - Performed by patient: Don/doff left sock Shoes - Performed by patient: Don/doff right shoe Shoes - Performed by helper: Don/doff right shoe, Don/doff left shoe, Fasten right, Fasten left AFO - Performed by helper: Don/doff left AFO Assist for footwear: Maximal assist Assist for lower body dressing: Touching or steadying assistance (Pt > 75%)  Function - Toileting Toileting activity did not occur: No continent bowel/bladder event Toileting steps completed by patient: Adjust clothing prior to toileting Toileting steps completed by helper: Adjust clothing prior to toileting, Performs perineal hygiene, Adjust clothing after toileting Toileting Assistive Devices: Grab bar or rail Assist level: Touching or steadying assistance (Pt.75%)  Function - Air cabin crew transfer activity did not occur:  (using urinal or incont., no BM) Toilet transfer assistive device: Elevated toilet seat/BSC over toilet, Grab bar Mechanical lift: Stedy Assist level to toilet: Moderate assist (Pt  50 - 74%/lift or lower) Assist level from toilet: Moderate assist (Pt 50 - 74%/lift or lower)  Function - Chair/bed transfer Chair/bed transfer method: Squat pivot Chair/bed transfer assist level: Moderate assist (Pt 50 - 74%/lift or lower) Chair/bed transfer assistive device: Armrests Mechanical lift: Stedy Chair/bed transfer details: Manual facilitation for weight shifting, Verbal cues for precautions/safety, Verbal cues for technique, Manual facilitation for placement  Function - Locomotion: Wheelchair Will patient use wheelchair at discharge?: Yes Type: Manual Max wheelchair distance: 50 Assist Level: Touching or steadying assistance (Pt > 75%) Assist Level: Supervision or verbal cues Assist Level: Supervision or verbal cues Turns around,maneuvers to table,bed, and toilet,negotiates 3% grade,maneuvers on rugs and over doorsills: No Function - Locomotion: Ambulation Ambulation activity did not occur: Safety/medical concerns (pt pushes toward hemi side; unsafe) Assistive device: Rail in hallway, Orthosis Max distance: 10 Assist level: 2 helpers Walk 10 feet activity did not occur: Safety/medical concerns Assist level: 2 helpers Walk 50 feet with 2 turns activity did not occur: Safety/medical concerns Walk 150 feet activity did not occur: Safety/medical concerns Walk 10 feet on uneven surfaces activity did not occur: Safety/medical concerns  Function - Comprehension Comprehension: Auditory Comprehension assist level: Follows basic conversation/direction with no assist  Function - Expression Expression: Verbal Expression assist level: Expresses basic needs/ideas: With no assist  Function - Social Interaction Social Interaction assist level: Interacts appropriately 75 - 89% of the time - Needs redirection for appropriate language or to initiate interaction.  Function - Problem Solving Problem solving assist level: Solves basic problems with no assist  Function -  Memory Memory assist level: Recognizes or recalls 75 - 89% of the time/requires cueing 10 - 24% of the time Patient normally able to recall (first 3 days only): Current season, Location of own room, Staff names and faces, That he or she is in a hospital  Medical Problem List and Plan: 1.  Left hemiplegia, limitations with self-care, difficulty with transfers secondary to right MCA CVA. Has hemorrhagic trasnformation but no worsening of neuro symptoms Will not initiate Xarelto at this point, will need neuro to advise on anticoag Per on-call neurology okay to give plavix and asa  Cont CIR PT, OT, speech therapies.  2.  DVT Prophylaxis/Anticoagulation: Pharmaceutical: Lovenox 3. Pain Management: Left hip/groin pain may be spasms , plus minus. Osteoarthritis. Check x-ray, Zanaflex at night  4. Mood: Team to provide ego support. Expressing anxiety about current situation. Has supportive family. LCSW to follow for evaluation and support.  5. Neuropsych: This patient is capable of making decisions on his own behalf. 6. Skin/Wound Care: routine pressure relief measures.  7. Fluids/Electrolytes/Nutrition: Monitor I/O. Check lytes in am. Encourage fluid intake.  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 9. T2DM: Hgb A1C- 10.5 --. Continue metformin 1000mg  bid. Was started on lantus  45 units daily at bedtime, with SSI for elevated BS--will consult RD for diabetic education and start education on insulin administration.  Will monitor BS ac/hs, goal 115 to 180---reasonable control at present 10.  Acute/subacute right lower lobe pulmonary embolism. Asymptomatic. Will check lower extremity Dopplers. Has IVC filter. Discussed with vascular surgery, who is in favor of anticoagulation, awaiting neurology consult, may need to wait at least 2 weeks   CBG (last 3)   Recent Labs  09/13/16 1216 09/13/16 1631 09/13/16 2147  GLUCAP 157* 103* 110*     10. HTN: Monitor BP bid--on lisinopril 20mg  daily.stabilizing in normal range 3/23 Vitals:   09/13/16 1313 09/14/16 0400  BP: 121/79 118/67  Pulse: 90 79  Resp: 18 16  Temp: 98.2 F (36.8 C) 97.9 F (36.6 C)   11. Leucocytosis: Monitor for signs of infection. UA negative. Afebrile,WBC 12.9 12. Polycythemia: Smokes 1.5 PPD. Expect this to remain elevated during hospitalization, On NicoDerm patch 13. COPD: SOB reported--CXR negative. Encourage tobacco cessation.  Managed without medications at this time.   14. Tobacco abuse: Counsel  14. Hypoalbuminemia, start pro-stat 15. Urinary retention,   BPH, trial of Flomax,  Urecholine stopped no ICP for > 1wk 16. 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 Donzetta Matters yesterday. Conservative mgt for now---he spoke to son at length. Dr. Donzetta Matters will follow up with patient in office once he's further recovered from CVA 17.  RLL pulm art branch PE, subacute /acute, has IVC filter whih means either clot originated above filter or is very small  LOS (Days) 20 A FACE TO FACE EVALUATION WAS PERFORMED  Meztli Llanas E 09/14/2016, 6:26 AM

## 2016-09-14 NOTE — Progress Notes (Addendum)
*  Preliminary Results* Bilateral lower extremity venous duplex completed. The right lower extremity is negative for deep vein thrombosis. The left lower extremity is positive for subacute deep vein thrombosis involving the left saphenofemoral junction, left common femoral vein, left femoral vein. There is no apparent change when compared to report from last study 08/24/16. There is no evidence of Baker's cyst bilaterally.  09/14/2016 3:58 PM Maudry Mayhew, BS, RVT, RDCS, RDMS

## 2016-09-14 NOTE — Progress Notes (Signed)
At 03:58 received a call from Intervention Radiologist Regarding Mr. Koeneman CT Scan: Placed a call to [place] and spoke to [place] Building control surveyor). I informed her I will be calling Neurology.   CT HEAD WITHOUT CONTRAST: IMPRESSION: Interval evolution of large right MCA territory infarct, with multiple new foci of acute intraparenchymal hemorrhage, measuring upto 1 cm in size. Scattered subarachnoid hemorrhage also noted along the cortical edges of the infarct. Associated edema seen, without evidence of midline shift. No evidence of transtentorial herniation. Progress notes were reviewed, I placed a call to the neurologist on call and spoke with Dr. Cheral Marker: He stated to Repeat the CT Head without Contrast in 24 hours ( 3:00 am) and No anticoagulation. He may continue with ASA and Plavix. Placed a call to [place] and spoke with [place], orders given.

## 2016-09-14 NOTE — Progress Notes (Signed)
Speech Language Pathology Daily Session Note  Patient Details  Name: Chad Combs MRN: [id number] Date of Birth: 1955/11/16  Today's Date: 09/14/2016 SLP Individual Time: 1000-1100 SLP Individual Time Calculation (min): 60 min  Short Term Goals: Week 3: SLP Short Term Goal 1 (Week 3): Given Min A verbal cues, pt will recall 1 activity from previous therapy session during day.  SLP Short Term Goal 2 (Week 3): Pt will utilize compensatory intelligibility strategies with Min A verbal cues to achieve >90% intelligibility at the simple conversation level.  SLP Short Term Goal 3 (Week 3): Pt will demonstrate sustained attention in moderately distracting environment for ~30 minutes with Min A verbal cues.  SLP Short Term Goal 4 (Week 3): Pt will complete basic, familiar problem solving tasks with Mod A verbal cues.  SLP Short Term Goal 5 (Week 3): Pt will scan to left of his environment to locate items within basic, familiar tasks with Mod A verbal cues.  SLP Short Term Goal 6 (Week 3): Pt will demonstrate anticipatory awareness by identifying 3 activites that are safe to participate in within home environement with Mod A verbal cues.   Skilled Therapeutic Interventions: Skilled treatment session focused on cognition goals. SLP facilitated session by providing Min A for sustained attention in moderately distracting environment for ~45 minutes. Pt with increased internal distractions d/t recent testing. Pt able to complete novel card game with Mod A to Min A cues. Pt able to recall activity from previous session with Min A verbal cues. Pt was returned to room, left upright in wheelchair, safety belt donned, chair alarm on and all needs within reach.      Function:    Cognition Comprehension Comprehension assist level: Follows basic conversation/direction with no assist  Expression   Expression assist level: Expresses basic needs/ideas: With no assist  Social Interaction Social Interaction assist  level: Interacts appropriately 75 - 89% of the time - Needs redirection for appropriate language or to initiate interaction.  Problem Solving Problem solving assist level: Solves basic problems with no assist  Memory Memory assist level: Recognizes or recalls 75 - 89% of the time/requires cueing 10 - 24% of the time    Pain    Therapy/Group: Individual Therapy   Chad Combs B. [name], M.S., CCC-SLP Speech-Language Pathologist   Chad Combs 09/14/2016, 12:07 PM

## 2016-09-14 NOTE — Consult Note (Signed)
Requesting Physician: Dr. Danae Chen    Chief Complaint:  Question regarding hemorrhagic conversion in setting of PE  History obtained from:  Patient     HPI:                                                                                                                                         Chad Combs is an 61 y.o. 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   History taken from chart review and son.  UDS negative. CT head done with evolving acute infarct in right parietal and temporal lobes,MRA brain done revealing occlusion of R-ICA and R-MCA with acute to early subacute large R-MCA infarct. 2 D echo with EF 60% and hypokinesis of anteroseptal myocardium suggestive of CAD.  Carotid dopplers with occlusion of R-ICA with no detectable flow in the neck beyond its origin and significant plaque at level of left carotid bulb and proximal IC causing significant proximal ICA stenosis > 70%. 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 Neurology was consulted for recommendations on [facility].   Date last known well: Date: 08/21/2016 Time last known well: Unable to determine tPA Given: No: subacute CVA out of window   Past Medical History:  Diagnosis Date  . Blind right eye   . COPD (chronic obstructive pulmonary disease) ([place])   . Diabetes mellitus without complication ([place])   . Hypertension     Past Surgical History:  Procedure Laterality Date  . IVC FILTER INSERTION N/A 08/24/2016   Procedure: IVC Filter Insertion;  Surgeon: Katha Cabal, MD;  Location: [place] CV [facility];  Service: Cardiovascular;  Laterality: N/A;    Family History   Problem Relation Age of Onset  . Cancer Mother   . Heart disease Father    Social History:  reports that he has been smoking Cigarettes.  He has a 67.50 pack-year smoking history. He has never used smokeless tobacco. He reports that he drinks alcohol. He reports that he does not use drugs.  Allergies: No Known Allergies  Medications:  Prior to Admission:  Prescriptions Prior to Admission  Medication Sig Dispense Refill Last Dose  . aspirin 81 MG chewable tablet Chew 1 tablet (81 mg total) by mouth daily. 30 tablet 0   . atorvastatin (LIPITOR) 40 MG tablet Take 1 tablet (40 mg total) by mouth daily at 6 PM. 30 tablet 0   . clopidogrel (PLAVIX) 75 MG tablet Take 1 tablet (75 mg total) by mouth daily. 30 tablet 0   . fluticasone (FLONASE) 50 MCG/ACT nasal spray Place 1 spray into both nostrils daily. 1 g 2   . Fluticasone-Salmeterol (ADVAIR) 100-50 MCG/DOSE AEPB Inhale 1 puff into the lungs 2 (two) times daily.   08/21/2016 at Unknown time  . gabapentin (NEURONTIN) 100 MG capsule Take 100 mg by mouth 2 (two) times daily as needed.   prn  . lisinopril (PRINIVIL,ZESTRIL) 20 MG tablet Take 20 mg by mouth daily.   08/21/2016 at Unknown time  . metFORMIN (GLUCOPHAGE) 500 MG tablet Take 1,000 mg by mouth 2 (two) times daily.    08/21/2016 at Unknown time   Scheduled: . aspirin  81 mg Oral Daily  . atorvastatin  40 mg Oral q1800  . bethanechol  10 mg Oral TID  . famotidine  20 mg Oral BID  . feeding supplement (PRO-STAT SUGAR FREE 64)  30 mL Oral BID  . fluticasone  1 spray Each Nare Daily  . gabapentin  100 mg Oral QHS  . insulin aspart  0-20 Units Subcutaneous TID WC & HS  . insulin glargine  45 Units Subcutaneous Daily  . insulin starter kit- pen needles  1 kit Other Once  . lisinopril  20 mg Oral Daily  . metFORMIN  1,000 mg Oral BID WC  . mometasone-formoterol  2 puff  Inhalation BID  . nicotine  21 mg Transdermal Daily  . tamsulosin  0.4 mg Oral QPC supper  . tiZANidine  2 mg Oral QHS    ROS:                                                                                                                                       History obtained from the patient  General ROS: negative for - chills, fatigue, fever, night sweats, weight gain or weight loss Psychological ROS: negative for - behavioral disorder, hallucinations, memory difficulties, mood swings or suicidal ideation Ophthalmic ROS: negative for - blurry vision, double vision, eye pain or loss of vision ENT ROS: negative for - epistaxis, nasal discharge, oral lesions, sore throat, tinnitus or vertigo Allergy and Immunology ROS: negative for - hives or itchy/watery eyes Hematological and Lymphatic ROS: negative for - bleeding problems, bruising or swollen lymph nodes Endocrine ROS: negative for - galactorrhea, hair pattern changes, polydipsia/polyuria or temperature intolerance Respiratory ROS: negative for - cough, hemoptysis, shortness of breath or wheezing Cardiovascular ROS: negative for - chest pain, dyspnea on exertion, edema or irregular heartbeat Gastrointestinal  ROS: negative for - abdominal pain, diarrhea, hematemesis, nausea/vomiting or stool incontinence Genito-Urinary ROS: negative for - dysuria, hematuria, incontinence or urinary frequency/urgency Musculoskeletal ROS: negative for - joint swelling or muscular weakness Neurological ROS: as noted in HPI Dermatological ROS: negative for rash and skin lesion changes  Neurologic Examination:                                                                                                      Blood pressure 118/67, pulse 79, temperature 97.9 F (36.6 C), temperature source Oral, resp. rate 16, height 5' 9"  (1.753 m), weight 91.9 kg (202 lb 8.6 oz), SpO2 99 %.  HEENT-  Normocephalic, no lesions, without obvious abnormality.  Normal external  eye and conjunctiva.  Normal TM's bilaterally.  Normal auditory canals and external ears. Normal external nose, mucus membranes and septum.  Normal pharynx. Cardiovascular- S1, S2 normal, pulses palpable throughout   Lungs- chest clear, no wheezing, rales, normal symmetric air entry Abdomen- normal findings: bowel sounds normal Extremities- no edema Lymph-no adenopathy palpable Musculoskeletal-no joint tenderness, deformity or swelling Skin-warm and dry, no hyperpigmentation, vitiligo, or suspicious lesions  Neurological Examination Mental Status: Alert, oriented, thought content appropriate.  Speech fluent without evidence of aphasia.  Able to follow 3 step commands without difficulty. Cranial Nerves: II:  Blind in his right eye otherwise visual fields are within normal limits,  III,IV, VI: ptosis present in the right eye, extra-ocular motions intact bilaterally when looking vertically his eyes become disconjugate more with his right eye than left, pupils equal, round, reactive to light and accommodation V,VII: smile initially has a left facial droop but when fully smiling and appears symmetric, facial light touch sensation normal bilaterally VIII: hearing normal bilaterally IX,X: uvula rises symmetrically XI: bilateral shoulder shrug XII: midline tongue extension Motor: Right : Upper extremity   5/5    Left:     Upper extremity   0/5  Lower extremity   5/5     Lower extremity   0/5 --Paratonia noted with the left arm  Sensory: Sensation intact bilaterally however subjectively he feels that his left arm has increased sensation than his right arm Deep Tendon Reflexes: 2+ and symmetric throughout Plantars: Right: downgoing   Left: downgoing Cerebellar: normal finger-to-nose on the right unable to take part in on the left, normal heel-to-shin with the right leg unable to do on the left Gait: Not tested       Lab Results: Basic Metabolic Panel:  Recent Labs Lab 09/08/16 0952   NA 138  K 3.8  CL 101  CO2 24  GLUCOSE 214*  BUN 18  CREATININE 1.07  CALCIUM 9.8    Liver Function Tests: No results for input(s): AST, ALT, ALKPHOS, BILITOT, PROT, ALBUMIN in the last 168 hours. No results for input(s): LIPASE, AMYLASE in the last 168 hours. No results for input(s): AMMONIA in the last 168 hours.  CBC:  Recent Labs Lab 09/08/16 0952  WBC 12.9*  HGB 17.9*  HCT 52.5*  MCV 97.9  PLT 275    Cardiac Enzymes: No results for  input(s): CKTOTAL, CKMB, CKMBINDEX, TROPONINI in the last 168 hours.  Lipid Panel: No results for input(s): CHOL, TRIG, HDL, CHOLHDL, VLDL, LDLCALC in the last 168 hours.  CBG:  Recent Labs Lab 09/13/16 0635 09/13/16 1216 09/13/16 1631 09/13/16 2147 09/14/16 0636  GLUCAP 115* 157* 103* 110* 119*    Microbiology: No results found for this or any previous visit.  Coagulation Studies: No results for input(s): LABPROT, INR in the last 72 hours.  Imaging: Ct Head Wo Contrast  Result Date: 09/14/2016 CLINICAL DATA:  Follow up cerebrovascular accident. Considering anticoagulation. Assess for hemorrhagic transformation. Initial encounter. EXAM: CT HEAD WITHOUT CONTRAST TECHNIQUE: Contiguous axial images were obtained from the base of the skull through the vertex without intravenous contrast. COMPARISON:  MRI of the brain performed 08/22/2016 FINDINGS: Brain: There has been interval evolution of the large right MCA territory infarct, with multiple new foci of acute intraparenchymal hemorrhage, measuring up to 1 cm in size. Scattered subarachnoid hemorrhage is also noted along the cortical edges of the infarct. Associated edema is noted, without evidence of midline shift at this time. There is no evidence of transtentorial herniation. The infarct involves much of the right basal ganglia. The brainstem and fourth ventricle are within normal limits. Vascular: No hyperdense vessel or unexpected calcification. Skull: There is no evidence of  fracture; visualized osseous structures are unremarkable in appearance. Sinuses/Orbits: The orbits are within normal limits. The paranasal sinuses and mastoid air cells are well-aerated. Other: No significant soft tissue abnormalities are seen. IMPRESSION: Interval evolution of large right MCA territory infarct, with multiple new foci of acute intraparenchymal hemorrhage, measuring up to 1 cm in size. Scattered subarachnoid hemorrhage also noted along the cortical edges of the infarct. Associated edema seen, without evidence of midline shift. No evidence of transtentorial herniation. Critical Value/emergent results were called by telephone at the time of interpretation on 09/14/2016 at 4:00 am to Danella Sensing NP, who verbally acknowledged these results. Electronically Signed   By: Garald Balding M.D.   On: 09/14/2016 04:03   Ct Angio Chest/abd/pel For Dissection W And/or W/wo  Result Date: 09/13/2016 CLINICAL DATA:  Infrarenal abdominal aortic aneurysm on screening study . Hx of:IVC FilterHTN DMCOPD . EXAM: CT ANGIOGRAPHY CHEST, ABDOMEN AND PELVIS TECHNIQUE: Multidetector CT imaging through the chest, abdomen and pelvis was performed using the standard protocol during bolus administration of intravenous contrast. Multiplanar reconstructed images and MIPs were obtained and reviewed to evaluate the vascular anatomy. CONTRAST:  100 mL Isovue 370 IV COMPARISON:  Ultrasound 09/09/2016 FINDINGS: CTA CHEST FINDINGS Cardiovascular: There is good contrast opacification of pulmonary artery branches. There is a single segmental filling defect in the medial basal segment right lower lobe branch of the pulmonary artery image 88/7 consistent with acute/ subacute PE. There is no dilatation of the right ventricle. Scattered coronary calcifications. There is good contrast opacification of the thoracic aorta with no evidence of dissection or stenosis. Classic 3 vessel brachiocephalic arterial origin anatomy without proximal  stenosis. There is a penetrating atheromatous ulcer in the distal aortic arch just beyond the left subclavian origin associated with a saccular 3.1 cm aneurysmal dilatation, containing some irregular mural thrombus. The distal descending thoracic segment is unremarkable. Mediastinum/Nodes: No enlarged mediastinal, hilar, or axillary lymph nodes. Thyroid gland, trachea, and esophagus demonstrate no significant findings. Lungs/Pleura: Emphysematous changes most marked in the apices. No airspace infiltrate. No pleural effusion. No pneumothorax. Musculoskeletal: No chest wall abnormality. No acute or significant osseous findings. Old fracture deformity of the left eleventh  rib. Review of the MIP images confirms the above findings. CTA ABDOMEN AND PELVIS FINDINGS VASCULAR Aorta: Scattered atheromatous plaque in the suprarenal segment with mild mural thrombus, no dissection or stenosis. Fusiform aneurysm of the infrarenal aorta extending across the bifurcation measuring 6.8 cm transverse diameter the just above the bifurcation. There is a large amount of mural thrombus within the aneurysm. No retroperitoneal hematoma. Celiac: Proximal short-segment stenosis of the mid level of the median arcuate ligament of the diaphragm, patent distally. SMA: Patent without evidence of aneurysm, dissection, vasculitis or significant stenosis. Renals: Both renal arteries are patent without evidence of aneurysm, dissection, vasculitis, fibromuscular dysplasia or significant stenosis. IMA: Short segment origin stenosis, reconstituted distally by visceral collaterals. Inflow: Contiguous extension of the aortic aneurysm into the common iliac artery is, extending only a short distance on the left in measure in 3.2 cm diameter, and extending nearly to the bifurcation of the right common iliac artery, measuring up to 4.2 cm diameter, tapering to a diameter of 19 mm just above the bifurcation. Calcified eccentric plaque through bilateral external  iliac artery is resulting in tandem areas of mild stenosis. Visualized proximal arterial outflow unremarkable bilaterally. Veins: Dedicated venous phase imaging not obtained. Infrarenal IVC filter is in expected location. Review of the MIP images confirms the above findings. NON-VASCULAR Hepatobiliary: No focal liver abnormality is seen. No gallstones, gallbladder wall thickening, or biliary dilatation. Pancreas: Unremarkable. No pancreatic ductal dilatation or surrounding inflammatory changes. Spleen: Normal in size without focal abnormality. Adrenals/Urinary Tract: Adrenal glands are unremarkable. Kidneys are normal, without renal calculi, focal lesion, or hydronephrosis. Urinary bladder is physiologically distended There is curvilinear layering high attenuation material along the left posterolateral aspect of the urinary bladder wall. Stomach/Bowel: Stomach mildly distended by ingested material. Small bowel nondilated. Appendix not identified. The colon is nondilated, unremarkable. Lymphatic: No abdominal or pelvic adenopathy localized. Reproductive: Prostatic enlargement protruding into the lumen of the urinary bladder. Other: No ascites.  No free air. Musculoskeletal: Degenerative disc disease L5-S1. Negative for fracture or worrisome bone lesion. Review of the MIP images confirms the above findings. IMPRESSION: 1. Solitary acute PE in the medial basal segment right lower lobe pulmonary artery branch. 2. 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 3. 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 4. Infrarenal retrievable IVC filter. 5. Curvilinear calcifications along the left posterolateral urinary bladder wall may represent small layering calculi versus partially calcified bladder wall lesion. Electronically Signed   By: Lucrezia Europe M.D.   On:  09/10/2016 14:28   Ct Angio Chest/abd/pel For Dissection W And/or W/wo  Result Date: 09/13/2016 CLINICAL DATA:  Infrarenal abdominal aortic aneurysm on screening study . Hx of:IVC FilterHTN DMCOPD . EXAM: CT ANGIOGRAPHY CHEST, ABDOMEN AND PELVIS TECHNIQUE: Multidetector CT imaging through the chest, abdomen and pelvis was performed using the standard protocol during bolus administration of intravenous contrast. Multiplanar reconstructed images and MIPs were obtained and reviewed to evaluate the vascular anatomy. CONTRAST:  100 mL Isovue 370 IV COMPARISON:  Ultrasound 09/09/2016 FINDINGS: CTA CHEST FINDINGS Cardiovascular: There is good contrast opacification of pulmonary artery branches. There is a single segmental filling defect in the medial basal segment right lower lobe branch of the pulmonary artery image 88/7 consistent with acute/ subacute PE. There is no dilatation of the right ventricle. Scattered coronary calcifications. There is good contrast opacification of the thoracic  aorta with no evidence of dissection or stenosis. Classic 3 vessel brachiocephalic arterial origin anatomy without proximal stenosis. There is a penetrating atheromatous ulcer in the distal aortic arch just beyond the left subclavian origin associated with a saccular 3.1 cm aneurysmal dilatation, containing some irregular mural thrombus. The distal descending thoracic segment is unremarkable. Mediastinum/Nodes: No enlarged mediastinal, hilar, or axillary lymph nodes. Thyroid gland, trachea, and esophagus demonstrate no significant findings. Lungs/Pleura: Emphysematous changes most marked in the apices. No airspace infiltrate. No pleural effusion. No pneumothorax. Musculoskeletal: No chest wall abnormality. No acute or significant osseous findings. Old fracture deformity of the left eleventh rib. Review of the MIP images confirms the above findings. CTA ABDOMEN AND PELVIS FINDINGS VASCULAR Aorta: Scattered atheromatous plaque in the  suprarenal segment with mild mural thrombus, no dissection or stenosis. Fusiform aneurysm of the infrarenal aorta extending across the bifurcation measuring 6.8 cm transverse diameter the just above the bifurcation. There is a large amount of mural thrombus within the aneurysm. No retroperitoneal hematoma. Celiac: Proximal short-segment stenosis of the mid level of the median arcuate ligament of the diaphragm, patent distally. SMA: Patent without evidence of aneurysm, dissection, vasculitis or significant stenosis. Renals: Both renal arteries are patent without evidence of aneurysm, dissection, vasculitis, fibromuscular dysplasia or significant stenosis. IMA: Short segment origin stenosis, reconstituted distally by visceral collaterals. Inflow: Contiguous extension of the aortic aneurysm into the common iliac artery is, extending only a short distance on the left in measure in 3.2 cm diameter, and extending nearly to the bifurcation of the right common iliac artery, measuring up to 4.2 cm diameter, tapering to a diameter of 19 mm just above the bifurcation. Calcified eccentric plaque through bilateral external iliac artery is resulting in tandem areas of mild stenosis. Visualized proximal arterial outflow unremarkable bilaterally. Veins: Dedicated venous phase imaging not obtained. Infrarenal IVC filter is in expected location. Review of the MIP images confirms the above findings. NON-VASCULAR Hepatobiliary: No focal liver abnormality is seen. No gallstones, gallbladder wall thickening, or biliary dilatation. Pancreas: Unremarkable. No pancreatic ductal dilatation or surrounding inflammatory changes. Spleen: Normal in size without focal abnormality. Adrenals/Urinary Tract: Adrenal glands are unremarkable. Kidneys are normal, without renal calculi, focal lesion, or hydronephrosis. Urinary bladder is physiologically distended There is curvilinear layering high attenuation material along the left posterolateral aspect  of the urinary bladder wall. Stomach/Bowel: Stomach mildly distended by ingested material. Small bowel nondilated. Appendix not identified. The colon is nondilated, unremarkable. Lymphatic: No abdominal or pelvic adenopathy localized. Reproductive: Prostatic enlargement protruding into the lumen of the urinary bladder. Other: No ascites.  No free air. Musculoskeletal: Degenerative disc disease L5-S1. Negative for fracture or worrisome bone lesion. Review of the MIP images confirms the above findings. IMPRESSION: 1. Solitary acute PE in the medial basal segment right lower lobe pulmonary artery branch. 2. 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 3. 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 4. Infrarenal retrievable IVC filter. 5. Curvilinear calcifications along the left posterolateral urinary bladder wall may represent small layering calculi versus partially calcified bladder wall lesion. Electronically Signed   By: Lucrezia Europe M.D.   On: 09/10/2016 14:28    Assessment and plan discussed with with attending physician and they are in agreement.    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   Assessment: 61 y.o. male with a recent right MCA stroke with incidentally discovered small PE. He does have an IVC filter in place and on repeat head CT appears to have had a hemorrhagic conversion. It is unclear when this happened, but my suspicion is that it happened a while ago given that it may already be visible on MRI as petechial hemorrhage, and the fact that he is improving not getting worse.  Given that he is clinically stable and has an IVC filter, I think that it would be  reasonable to repeat a head CT tomorrow and then consider the risk versus benefit of anticoagulation at that time. I do wonder if this actually occurred prior to placement of the IVC filter.  Stroke Risk Factors - diabetes mellitus and hypertension  1) repeat head CT in the morning. 2) if this appears stable, and there is a strong clinical indication for anticoagulation then would consider using stroke protocol heparin at least for the first few days. 3) stroke team to follow  Roland Rack, MD [facility] [phone]  If 7pm- 7am, please page neurology on call as listed in [place].

## 2016-09-14 NOTE — Progress Notes (Signed)
Physical Therapy Session Note  Patient Details  Name: Chad Combs MRN: [id number] Date of Birth: 06-25-1955  Today's Date: 09/14/2016 PT Individual Time: 0800-0900 PT Individual Time Calculation (min): 60 min   Short Term Goals: Week 3:  PT Short Term Goal 1 (Week 3): pt will transfer to R squat pivot with supervision 75% of trials PT Short Term Goal 2 (Week 3): pt will perform gait with LRAD (not railing) x 20' with max assist PT Short Term Goal 3 (Week 3): pt will maintain standing balance during functional activity x 5 minutes with mod assist.  Skilled Therapeutic Interventions/Progress Updates: Pt presented in bed asleep easy to arouse. Per MD ok to treat (due to noted new active bleed). Pt agreeable to therapy. Performed supine to sit with modA cues for hand placement and attempted controlled movement. Pt sat at EOB to use urinal (+ void). Performed  Sit to stand with modA and +2 for safety, total assist for pulling up pants. Performed squat pivot with cues for slowing movement and planning squat. Remained at pt's bedside while completing breakfast. Pt left in w/c with chair alarm and QRB in place and needs met.      Therapy Documentation Precautions:  Precautions Precautions: Fall Precaution Comments: L sided neglect, shoulder increased risk for subluxation; quick LOB L wihtout awareness Restrictions Weight Bearing Restrictions: No   See Function Navigator for Current Functional Status.   Therapy/Group: Individual Therapy  Shepherd Finnan  Nyari Olsson, PTA  09/14/2016, 12:24 PM

## 2016-09-14 NOTE — Progress Notes (Signed)
Occupational Therapy Session Note  Patient Details  Name: Chad Combs MRN: [id number] Date of Birth: Jul 08, 1955  Today's Date: 09/14/2016 OT Individual Time: 1300-1400 OT Individual Time Calculation (min): 60 min    Short Term Goals: Week 3:  OT Short Term Goal 1 (Week 3): Pt will consistently complete transfer to toilet with mod A of 1 person A only. OT Short Term Goal 2 (Week 3): Pt will be able to pull pants over hips himself with min A to stabilize balance. OT Short Term Goal 3 (Week 3): Pt will be able to wash R and L leg arm using a long handled sponge with S. OT Short Term Goal 4 (Week 3): Pt will be able to wt shift to L side safely to wash bottom with lateral lean on tub bench with steadying A.  Skilled Therapeutic Interventions/Progress Updates:    Pt seen for OT session focusing on ADL re-training. Pt asleep in supine upon arrival, easily awoken and agreeable to tx session. He declined pain but voiced increased fatigue as he was awoken early for CT scan this morning. Pt laying on L arm upon arrival, no awareness of positioning. Educated regarding importance of proper positioning of hemi UE, pt voiced understanding. He transferred to EOB with mod A for advancement of L UE/LEs and VCs for technique. Pt ate lunch seated EOB, occasional VCs required for smaller bites and finishing residual food before taking next bite. Pt able to attend to all items on food tray without cuing.  He completed bathing/dressing seated EOB. +2 required for sit <> stand for clothing management, pt with strong L lean, unable to self correct with VCs. Min A squat pivot transfer to w/c, pt able to direct sequencing of transfer.  Grooming completed from w/c level at sink with occasional assist for set-up of grooming tasks. Pt left seated in w/c at end of session, chair alarm on, QRB donned, and all needs in reach.   Therapy Documentation Precautions:  Precautions Precautions: Fall Precaution Comments: L  sided neglect, shoulder increased risk for subluxation; quick LOB L wihtout awareness Restrictions Weight Bearing Restrictions: No Pain:   No/ denies pain  See Function Navigator for Current Functional Status.   Therapy/Group: Individual Therapy  Lewis, Trevor Duty C 09/14/2016, 2:10 PM

## 2016-09-15 ENCOUNTER — Inpatient Hospital Stay (HOSPITAL_COMMUNITY): Payer: Self-pay

## 2016-09-15 ENCOUNTER — Inpatient Hospital Stay (HOSPITAL_COMMUNITY): Payer: Self-pay | Admitting: Occupational Therapy

## 2016-09-15 ENCOUNTER — Inpatient Hospital Stay (HOSPITAL_COMMUNITY): Payer: Self-pay | Admitting: Speech Pathology

## 2016-09-15 ENCOUNTER — Inpatient Hospital Stay (HOSPITAL_COMMUNITY): Payer: Self-pay | Admitting: Physical Therapy

## 2016-09-15 DIAGNOSIS — I82412 Acute embolism and thrombosis of left femoral vein: Secondary | ICD-10-CM

## 2016-09-15 LAB — BASIC METABOLIC PANEL
Anion gap: 9 (ref 5–15)
BUN: 16 mg/dL (ref 6–20)
CHLORIDE: 104 mmol/L (ref 101–111)
CO2: 24 mmol/L (ref 22–32)
CREATININE: 0.86 mg/dL (ref 0.61–1.24)
Calcium: 9.1 mg/dL (ref 8.9–10.3)
Glucose, Bld: 86 mg/dL (ref 65–99)
Potassium: 3.5 mmol/L (ref 3.5–5.1)
SODIUM: 137 mmol/L (ref 135–145)

## 2016-09-15 LAB — CBC
HEMATOCRIT: 45.5 % (ref 39.0–52.0)
Hemoglobin: 15.5 g/dL (ref 13.0–17.0)
MCH: 33.1 pg (ref 26.0–34.0)
MCHC: 34.1 g/dL (ref 30.0–36.0)
MCV: 97.2 fL (ref 78.0–100.0)
PLATELETS: 198 10*3/uL (ref 150–400)
RBC: 4.68 MIL/uL (ref 4.22–5.81)
RDW: 12 % (ref 11.5–15.5)
WBC: 11.6 10*3/uL — AB (ref 4.0–10.5)

## 2016-09-15 LAB — GLUCOSE, CAPILLARY
GLUCOSE-CAPILLARY: 152 mg/dL — AB (ref 65–99)
GLUCOSE-CAPILLARY: 101 mg/dL — AB (ref 65–99)
Glucose-Capillary: 87 mg/dL (ref 65–99)

## 2016-09-15 MED ORDER — EZETIMIBE 10 MG PO TABS
10.0000 mg | ORAL_TABLET | Freq: Every day | ORAL | Status: DC
  Administered 2016-09-15 – 2016-09-30 (×15): 10 mg via ORAL
  Filled 2016-09-15 (×19): qty 1

## 2016-09-15 MED ORDER — HEPARIN (PORCINE) IN NACL 100-0.45 UNIT/ML-% IJ SOLN
1400.0000 [IU]/h | INTRAMUSCULAR | Status: AC
Start: 2016-09-15 — End: 2016-09-17
  Administered 2016-09-15: 1300 [IU]/h via INTRAVENOUS
  Administered 2016-09-17: 1450 [IU]/h via INTRAVENOUS
  Filled 2016-09-15 (×3): qty 250

## 2016-09-15 NOTE — Progress Notes (Addendum)
STROKE TEAM PROGRESS NOTE   HISTORY OF PRESENT ILLNESS (per record) Chad Combs is an 61 y.o. 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 History taken from chart review and son. UDS negative. CT head done with evolving acute infarct in right parietal and temporal lobes,MRA brain done revealing occlusion of R-ICA and R-MCA with acute to early subacute large R-MCA infarct. 2 D echo with EF 60% and hypokinesis of anteroseptal myocardium suggestive of CAD. Carotid dopplers with occlusion of R-ICA with no detectable flow in the neck beyond its origin and significant plaque at level of left carotid bulb and proximal IC causing significant proximal ICA stenosis >70%. 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 Neurology was consulted for recommendations on [facility].    SUBJECTIVE (INTERVAL HISTORY) No family at bedside. Pt lying in bed without complains. He was surprised that I told him that he had PE and hemorrhagic transformation of the stroke.    OBJECTIVE Temp:  [97.7 F (36.5 C)-98 F (36.7 C)] 97.7 F (36.5 C) (03/28 1309) Pulse Rate:  [77-86] 86 (03/28 1309) Resp:  [18] 18 (03/28 1309) BP: (110-137)/(65-74) 137/74 (03/28 1309) SpO2:  [94 %-98 %] 96 % (03/28 1309)  CBC:  Recent Labs Lab 09/15/16 0541  WBC 11.6*  HGB 15.5  HCT 45.5  MCV 97.2  PLT 158    Basic Metabolic Panel:  Recent Labs Lab 09/15/16 0541  NA 137  K 3.5  CL 104  CO2 24  GLUCOSE 86  BUN 16  CREATININE 0.86  CALCIUM 9.1    Lipid Panel:    Component Value Date/Time   CHOL 193 08/22/2016 0747   TRIG 441 (H) 08/22/2016 0747   HDL 35 (L) 08/22/2016  0747   CHOLHDL 5.5 08/22/2016 0747   VLDL UNABLE TO CALCULATE IF TRIGLYCERIDE OVER 400 mg/dL 08/22/2016 0747   LDLCALC UNABLE TO CALCULATE IF TRIGLYCERIDE OVER 400 mg/dL 08/22/2016 0747   HgbA1c:  Lab Results  Component Value Date   HGBA1C 10.5 (H) 08/22/2016   Urine Drug Screen:    Component Value Date/Time   LABOPIA NONE DETECTED 08/22/2016 0359   COCAINSCRNUR NONE DETECTED 08/22/2016 0359   LABBENZ NONE DETECTED 08/22/2016 0359   AMPHETMU NONE DETECTED 08/22/2016 0359   THCU NONE DETECTED 08/22/2016 0359   LABBARB NONE DETECTED 08/22/2016 0359      IMAGING I have personally reviewed the radiological images below and agree with the radiology interpretations.  Ct Head Wo Contrast 09/15/2016 Stable right MCA distribution infarct and small foci of hemorrhagic conversion. Stable mild local mass effect. No herniation. No new acute intracranial abnormality identified.   09/14/2016 Interval evolution of large right MCA territory infarct, with multiple new foci of acute intraparenchymal hemorrhage, measuring up to 1 cm in size. Scattered subarachnoid hemorrhage also noted along the cortical edges of the infarct. Associated edema seen, without evidence of midline shift. No evidence of transtentorial herniation.    PHYSICAL EXAM  Temp:  [97.7 F (36.5 C)-98 F (36.7 C)] 97.7 F (36.5 C) (03/28 1309) Pulse Rate:  [77-86] 86 (03/28 1309) Resp:  [18] 18 (03/28 1309) BP: (  110-137)/(65-74) 137/74 (03/28 1309) SpO2:  [94 %-98 %] 96 % (03/28 1309)  General - Well nourished, well developed, in no apparent distress.  Ophthalmologic - Fundi not visualized due to noncooperation.  Cardiovascular - Regular rate and rhythm.  Mental Status -  Level of arousal and orientation to time, place, and person were intact. Language including expression, naming, repetition, comprehension was assessed and found intact.  Cranial Nerves II - XII - II - Visual field intact. III, IV, VI - Extraocular  movements intact. V - Facial sensation intact bilaterally. VII - left mild facial droop. VIII - Hearing & vestibular intact bilaterally. X - Palate elevates symmetrically. XI - Chin turning & shoulder shrug intact bilaterally. XII - Tongue protrusion intact.  Motor Strength - The patient's strength was normal in RUE and RLE, however, LUE 0/5 and LLE 2/5 proximal and 0/5 distal and pronator drift was absent.  Bulk was normal and fasciculations were absent.   Motor Tone - Muscle tone was assessed at the neck and appendages and was normal.  Reflexes - The patient's reflexes were 1+ in all extremities and he had no pathological reflexes.  Sensory - Light touch, temperature/pinprick were assessed and were symmetrical.    Coordination - The patient had normal movements in the right hand with no ataxia or dysmetria.  Tremor was absent.  Gait and Station - not tested due to weakness   ASSESSMENT/PLAN Mr. Chad Combs is a 61 y.o. 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  Transferred to [facility] for IP rehab where they were concerned about [facility] option(s).  Stroke:  right MCA infarct with hemorrhagic transformation, embolic source likely due to R ICA occlusion  CT head 3/27 MRI  Evolution R MCA infarct with new 1cm IPH. Scattered SAH cortical edges of infarct. Cerebral edema without midline shift.  CT head 3/28  . Stable R MCA infarct and small hemorrhagic conversion. Local mass effect. No herniation. No acute abnormality.  MRI - large right MCA infarct  MRA - right ICA and right MCA occlusion  Carotid Doppler  occlusion of R-ICA  and left ICA stenosis >70%  2D Echo - EF 60% and hypokinesis of anteroseptal myocardium suggestive of CAD  LDL UTC with TG 441  HgbA1c 10.5  DIET DYS 3 Room service appropriate? Yes with Assist; Fluid consistency: Thin  clopidogrel 75 mg daily prior to admission, now on aspirin 81 mg daily given small hemorrhagic transformation. Will start heparin for PE and stop ASA.   Patient counseled to be compliant with his antithrombotic medications  Ongoing aggressive stroke risk factor management  Therapy recommendations: continue CIR  Disposition:  pending after CIR  Carotid stenosis/occlusion  CUS/MRA showed right ICA occlusion and left ICA > 70% stenosis  Pt has higher risk of recurrent stroke  Now stroke has been more than 3 weeks out, may consider left ICA intervention for further stroke prevention  Vascular surgery Dr. Donzetta Matters is on board and aware of the left ICA stenosis.   DVT/PE  LLE DVT found in 04/2016   LE venous doppler continued to show  subacute DVT on 08/24/16  s/p IVC filter 08/24/2016  Again showed subacute LLE DVT on 09/14/16  CTA chest showed right solitary acute PE. However, I suspect the PE may be subacute and may be developed before IVC placement.   Hemorrhagic transformation is stable on CT repeat.   OK to start heparin IV with no bolus and low intensity. If neuro stable for the next 2-3 days, can transition to NOACs maintenance dose.    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  Dr. Donzetta Matters from [facility] is on board   Hypertension  Stable  BP goal 130-150 due to carotid occlusion/stenosis  Hyperlipidemia  Rehab meds:  lipitor 40  LDL UTC, goal < 70  High TG 441  Add zetia to lipitor 40  Continue statin and zetia  Diabetes  HgbA1c  10.5, goal < 7.0  Uncontrolled  On lantus  SSI  Tobacco abuse  Current smoker  Smoking cessation counseling provided  Nicotine patch provided  Pt is willing to quit  Other Stroke Risk Factors  ETOH use, advised to drink no more than 2 drink(s) a day  Other Active Problems  Blind R eye  COPD  Hospital day # 21  Neurology will sign off. Please call with questions. Pt will follow up with Dr. Erlinda Hong at [facility] in about 6 weeks. Thanks for the consult.  Rosalin Hawking, MD PhD Stroke Neurology 09/15/2016 4:56 PM    To contact Stroke Continuity provider, please refer to [email or web address] After hours, contact General Neurology

## 2016-09-15 NOTE — Progress Notes (Signed)
Subjective/Complaints:   Discussed with neurology. Also discussed with pulmonology. IVC filter was placed 08/24/2016. Repeat lower extremity Dopplers show no new DVT, but does show subacute DVT. Vascular neurology follow-up is pending   ROS: pt denies nausea, vomiting, diarrhea, cough, shortness of breath or chest pain    Objective: Vital Signs: Blood pressure 110/65, pulse 77, temperature 98 F (36.7 C), temperature source Oral, resp. rate 18, height 5' 9"  (1.753 m), weight 91.9 kg (202 lb 8.6 oz), SpO2 98 %. Ct Head Wo Contrast  Result Date: 09/15/2016 CLINICAL DATA:  61 y/o  M; follow-up of intracranial hemorrhage. EXAM: CT HEAD WITHOUT CONTRAST TECHNIQUE: Contiguous axial images were obtained from the base of the skull through the vertex without intravenous contrast. COMPARISON:  09/14/2016 CT of the head. FINDINGS: Brain: Stable distribution of right MCA infarction with stable small foci of hemorrhage. Mild local mass effect with partial effacement of the right lateral ventricle. No evidence for new large acute infarction, new intracranial hemorrhage, or new focal mass effect. Vascular: Moderate calcific atherosclerosis of cavernous and paraclinoid internal carotid arteries. Skull: Normal. Negative for fracture or focal lesion. Sinuses/Orbits: Small chronic right lamina papyracea fracture with herniation of extra orbital fat. Small left anterior ethmoid air cell mucous retention cyst. Visualized paranasal sinuses and mastoid air cells are otherwise normally aerated. Orbits are unremarkable. Other: None. IMPRESSION: Stable right MCA distribution infarct and small foci of hemorrhagic conversion. Stable mild local mass effect. No herniation. No new acute intracranial abnormality identified. Electronically Signed   By: Kristine Garbe M.D.   On: 09/15/2016 03:06   Ct Head Wo Contrast  Result Date: 09/14/2016 CLINICAL DATA:  Follow up cerebrovascular accident. Considering  anticoagulation. Assess for hemorrhagic transformation. Initial encounter. EXAM: CT HEAD WITHOUT CONTRAST TECHNIQUE: Contiguous axial images were obtained from the base of the skull through the vertex without intravenous contrast. COMPARISON:  MRI of the brain performed 08/22/2016 FINDINGS: Brain: There has been interval evolution of the large right MCA territory infarct, with multiple new foci of acute intraparenchymal hemorrhage, measuring up to 1 cm in size. Scattered subarachnoid hemorrhage is also noted along the cortical edges of the infarct. Associated edema is noted, without evidence of midline shift at this time. There is no evidence of transtentorial herniation. The infarct involves much of the right basal ganglia. The brainstem and fourth ventricle are within normal limits. Vascular: No hyperdense vessel or unexpected calcification. Skull: There is no evidence of fracture; visualized osseous structures are unremarkable in appearance. Sinuses/Orbits: The orbits are within normal limits. The paranasal sinuses and mastoid air cells are well-aerated. Other: No significant soft tissue abnormalities are seen. IMPRESSION: Interval evolution of large right MCA territory infarct, with multiple new foci of acute intraparenchymal hemorrhage, measuring up to 1 cm in size. Scattered subarachnoid hemorrhage also noted along the cortical edges of the infarct. Associated edema seen, without evidence of midline shift. No evidence of transtentorial herniation. Critical Value/emergent results were called by telephone at the time of interpretation on 09/14/2016 at 4:00 am to Danella Sensing NP, who verbally acknowledged these results. Electronically Signed   By: Garald Balding M.D.   On: 09/14/2016 04:03   Ct Angio Chest/abd/pel For Dissection W And/or W/wo  Result Date: 09/13/2016 CLINICAL DATA:  Infrarenal abdominal aortic aneurysm on screening study . Hx of:IVC FilterHTN DMCOPD . EXAM: CT ANGIOGRAPHY CHEST, ABDOMEN AND  PELVIS TECHNIQUE: Multidetector CT imaging through the chest, abdomen and pelvis was performed using the standard protocol during bolus administration of intravenous  contrast. Multiplanar reconstructed images and MIPs were obtained and reviewed to evaluate the vascular anatomy. CONTRAST:  100 mL Isovue 370 IV COMPARISON:  Ultrasound 09/09/2016 FINDINGS: CTA CHEST FINDINGS Cardiovascular: There is good contrast opacification of pulmonary artery branches. There is a single segmental filling defect in the medial basal segment right lower lobe branch of the pulmonary artery image 88/7 consistent with acute/ subacute PE. There is no dilatation of the right ventricle. Scattered coronary calcifications. There is good contrast opacification of the thoracic aorta with no evidence of dissection or stenosis. Classic 3 vessel brachiocephalic arterial origin anatomy without proximal stenosis. There is a penetrating atheromatous ulcer in the distal aortic arch just beyond the left subclavian origin associated with a saccular 3.1 cm aneurysmal dilatation, containing some irregular mural thrombus. The distal descending thoracic segment is unremarkable. Mediastinum/Nodes: No enlarged mediastinal, hilar, or axillary lymph nodes. Thyroid gland, trachea, and esophagus demonstrate no significant findings. Lungs/Pleura: Emphysematous changes most marked in the apices. No airspace infiltrate. No pleural effusion. No pneumothorax. Musculoskeletal: No chest wall abnormality. No acute or significant osseous findings. Old fracture deformity of the left eleventh rib. Review of the MIP images confirms the above findings. CTA ABDOMEN AND PELVIS FINDINGS VASCULAR Aorta: Scattered atheromatous plaque in the suprarenal segment with mild mural thrombus, no dissection or stenosis. Fusiform aneurysm of the infrarenal aorta extending across the bifurcation measuring 6.8 cm transverse diameter the just above the bifurcation. There is a large amount of  mural thrombus within the aneurysm. No retroperitoneal hematoma. Celiac: Proximal short-segment stenosis of the mid level of the median arcuate ligament of the diaphragm, patent distally. SMA: Patent without evidence of aneurysm, dissection, vasculitis or significant stenosis. Renals: Both renal arteries are patent without evidence of aneurysm, dissection, vasculitis, fibromuscular dysplasia or significant stenosis. IMA: Short segment origin stenosis, reconstituted distally by visceral collaterals. Inflow: Contiguous extension of the aortic aneurysm into the common iliac artery is, extending only a short distance on the left in measure in 3.2 cm diameter, and extending nearly to the bifurcation of the right common iliac artery, measuring up to 4.2 cm diameter, tapering to a diameter of 19 mm just above the bifurcation. Calcified eccentric plaque through bilateral external iliac artery is resulting in tandem areas of mild stenosis. Visualized proximal arterial outflow unremarkable bilaterally. Veins: Dedicated venous phase imaging not obtained. Infrarenal IVC filter is in expected location. Review of the MIP images confirms the above findings. NON-VASCULAR Hepatobiliary: No focal liver abnormality is seen. No gallstones, gallbladder wall thickening, or biliary dilatation. Pancreas: Unremarkable. No pancreatic ductal dilatation or surrounding inflammatory changes. Spleen: Normal in size without focal abnormality. Adrenals/Urinary Tract: Adrenal glands are unremarkable. Kidneys are normal, without renal calculi, focal lesion, or hydronephrosis. Urinary bladder is physiologically distended There is curvilinear layering high attenuation material along the left posterolateral aspect of the urinary bladder wall. Stomach/Bowel: Stomach mildly distended by ingested material. Small bowel nondilated. Appendix not identified. The colon is nondilated, unremarkable. Lymphatic: No abdominal or pelvic adenopathy localized.  Reproductive: Prostatic enlargement protruding into the lumen of the urinary bladder. Other: No ascites.  No free air. Musculoskeletal: Degenerative disc disease L5-S1. Negative for fracture or worrisome bone lesion. Review of the MIP images confirms the above findings. IMPRESSION: 1. Solitary acute PE in the medial basal segment right lower lobe pulmonary artery branch. 2. 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 3. Fusiform 6.8 cm infrarenal abdominal aortic aneurysm extending across the bifurcation into 4.2 cm right and  short segment 3.2 cm left common iliac artery aneurysms. 4. Infrarenal retrievable IVC filter. 5. Curvilinear calcifications along the left posterolateral urinary bladder wall may represent small layering calculi versus partially calcified bladder wall lesion. Electronically Signed   By: Lucrezia Europe M.D.   On: 09/10/2016 14:28   Ct Angio Chest/abd/pel For Dissection W And/or W/wo  Result Date: 09/13/2016 CLINICAL DATA:  Infrarenal abdominal aortic aneurysm on screening study . Hx of:IVC FilterHTN DMCOPD . EXAM: CT ANGIOGRAPHY CHEST, ABDOMEN AND PELVIS TECHNIQUE: Multidetector CT imaging through the chest, abdomen and pelvis was performed using the standard protocol during bolus administration of intravenous contrast. Multiplanar reconstructed images and MIPs were obtained and reviewed to evaluate the vascular anatomy. CONTRAST:  100 mL Isovue 370 IV COMPARISON:  Ultrasound 09/09/2016 FINDINGS: CTA CHEST FINDINGS Cardiovascular: There is good contrast opacification of pulmonary artery branches. There is a single segmental filling defect in the medial basal segment right lower lobe branch of the pulmonary artery image 88/7 consistent with acute/ subacute PE. There is no dilatation of the right ventricle. Scattered coronary calcifications. There is good contrast opacification of the thoracic aorta with no evidence of dissection or stenosis. Classic 3  vessel brachiocephalic arterial origin anatomy without proximal stenosis. There is a penetrating atheromatous ulcer in the distal aortic arch just beyond the left subclavian origin associated with a saccular 3.1 cm aneurysmal dilatation, containing some irregular mural thrombus. The distal descending thoracic segment is unremarkable. Mediastinum/Nodes: No enlarged mediastinal, hilar, or axillary lymph nodes. Thyroid gland, trachea, and esophagus demonstrate no significant findings. Lungs/Pleura: Emphysematous changes most marked in the apices. No airspace infiltrate. No pleural effusion. No pneumothorax. Musculoskeletal: No chest wall abnormality. No acute or significant osseous findings. Old fracture deformity of the left eleventh rib. Review of the MIP images confirms the above findings. CTA ABDOMEN AND PELVIS FINDINGS VASCULAR Aorta: Scattered atheromatous plaque in the suprarenal segment with mild mural thrombus, no dissection or stenosis. Fusiform aneurysm of the infrarenal aorta extending across the bifurcation measuring 6.8 cm transverse diameter the just above the bifurcation. There is a large amount of mural thrombus within the aneurysm. No retroperitoneal hematoma. Celiac: Proximal short-segment stenosis of the mid level of the median arcuate ligament of the diaphragm, patent distally. SMA: Patent without evidence of aneurysm, dissection, vasculitis or significant stenosis. Renals: Both renal arteries are patent without evidence of aneurysm, dissection, vasculitis, fibromuscular dysplasia or significant stenosis. IMA: Short segment origin stenosis, reconstituted distally by visceral collaterals. Inflow: Contiguous extension of the aortic aneurysm into the common iliac artery is, extending only a short distance on the left in measure in 3.2 cm diameter, and extending nearly to the bifurcation of the right common iliac artery, measuring up to 4.2 cm diameter, tapering to a diameter of 19 mm just above the  bifurcation. Calcified eccentric plaque through bilateral external iliac artery is resulting in tandem areas of mild stenosis. Visualized proximal arterial outflow unremarkable bilaterally. Veins: Dedicated venous phase imaging not obtained. Infrarenal IVC filter is in expected location. Review of the MIP images confirms the above findings. NON-VASCULAR Hepatobiliary: No focal liver abnormality is seen. No gallstones, gallbladder wall thickening, or biliary dilatation. Pancreas: Unremarkable. No pancreatic ductal dilatation or surrounding inflammatory changes. Spleen: Normal in size without focal abnormality. Adrenals/Urinary Tract: Adrenal glands are unremarkable. Kidneys are normal, without renal calculi, focal lesion, or hydronephrosis. Urinary bladder is physiologically distended There is curvilinear layering high attenuation material along the left posterolateral aspect of the urinary bladder wall. Stomach/Bowel: Stomach mildly distended  by ingested material. Small bowel nondilated. Appendix not identified. The colon is nondilated, unremarkable. Lymphatic: No abdominal or pelvic adenopathy localized. Reproductive: Prostatic enlargement protruding into the lumen of the urinary bladder. Other: No ascites.  No free air. Musculoskeletal: Degenerative disc disease L5-S1. Negative for fracture or worrisome bone lesion. Review of the MIP images confirms the above findings. IMPRESSION: 1. Solitary acute PE in the medial basal segment right lower lobe pulmonary artery branch. 2. 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 3. 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 4. Infrarenal retrievable IVC filter. 5. Curvilinear calcifications along the left posterolateral urinary bladder wall may represent small layering calculi versus partially calcified bladder  wall lesion. Electronically Signed   By: Lucrezia Europe M.D.   On: 09/10/2016 14:28   Results for orders placed or performed during the hospital encounter of 08/25/16 (from the past 72 hour(s))  Glucose, capillary     Status: Abnormal   Collection Time: 09/12/16 11:46 AM  Result Value Ref Range   Glucose-Capillary 128 (H) 65 - 99 mg/dL  Glucose, capillary     Status: None   Collection Time: 09/12/16  4:39 PM  Result Value Ref Range   Glucose-Capillary 94 65 - 99 mg/dL  Glucose, capillary     Status: Abnormal   Collection Time: 09/12/16  9:03 PM  Result Value Ref Range   Glucose-Capillary 180 (H) 65 - 99 mg/dL  Glucose, capillary     Status: Abnormal   Collection Time: 09/13/16  6:35 AM  Result Value Ref Range   Glucose-Capillary 115 (H) 65 - 99 mg/dL  Glucose, capillary     Status: Abnormal   Collection Time: 09/13/16 12:16 PM  Result Value Ref Range   Glucose-Capillary 157 (H) 65 - 99 mg/dL  Glucose, capillary     Status: Abnormal   Collection Time: 09/13/16  4:31 PM  Result Value Ref Range   Glucose-Capillary 103 (H) 65 - 99 mg/dL  Glucose, capillary     Status: Abnormal   Collection Time: 09/13/16  9:47 PM  Result Value Ref Range   Glucose-Capillary 110 (H) 65 - 99 mg/dL  Glucose, capillary     Status: Abnormal   Collection Time: 09/14/16  6:36 AM  Result Value Ref Range   Glucose-Capillary 119 (H) 65 - 99 mg/dL  Glucose, capillary     Status: Abnormal   Collection Time: 09/14/16 11:37 AM  Result Value Ref Range   Glucose-Capillary 165 (H) 65 - 99 mg/dL  Glucose, capillary     Status: None   Collection Time: 09/14/16  3:57 PM  Result Value Ref Range   Glucose-Capillary 92 65 - 99 mg/dL  Glucose, capillary     Status: None   Collection Time: 09/14/16  9:34 PM  Result Value Ref Range   Glucose-Capillary 96 65 - 99 mg/dL  Glucose, capillary     Status: None   Collection Time: 09/15/16  6:37 AM  Result Value Ref Range   Glucose-Capillary 87 65 - 99 mg/dL     HEENT:  normal Cardio: RRR Resp: CTA bilaterally GI: BS + Extremity:  No edema Skin:   Intact Neuro:  Flat, Abnormal Sensory Abnormal Motor Motor is 5/5 in the right deltoid, biceps, triceps, grip, hip flexor, knee extensor, ankle dorsiflexor, trace left biceps and finger flexors, 2 minus, left hip, knee extensor synergy,  0 at the ankle, Abnormal [facility] Ataxic/ dec FMC and Dysarthric Musc/Skel:  Other No pain with upper extremity or lower extremity range of motion Gen. no acute distress   Assessment/Plan: 1. Functional deficits secondary to Right MCA infarct with Left Hemiparesis which require 3+ hours per day of interdisciplinary therapy in a comprehensive inpatient rehab setting. Physiatrist is providing close team supervision and 24 hour management of active medical problems listed below. Physiatrist and rehab team continue to assess barriers to discharge/monitor patient progress toward functional and medical goals. FIM: Function - Bathing Position: Sitting EOB Body parts bathed by patient: Left arm, Chest, Abdomen, Front perineal area, Right upper leg, Left upper leg Body parts bathed by helper: Right arm, Buttocks, Back, Right lower leg, Left lower leg Assist Level: Touching or steadying assistance(Pt > 75%)  Function- Upper Body Dressing/Undressing What is the patient wearing?: Button up shirt Pull over shirt/dress - Perfomed by patient: Thread/unthread right sleeve, Put head through opening, Pull shirt over trunk Pull over shirt/dress - Perfomed by helper: Thread/unthread left sleeve Button up shirt - Perfomed by patient: Thread/unthread right sleeve, Thread/unthread left sleeve Button up shirt - Perfomed by helper: Pull shirt around back, Button/unbutton shirt Assist Level: Touching or steadying assistance(Pt > 75%) Function - Lower Body Dressing/Undressing Lower body dressing/undressing activity did not occur:  (did not occur with this clinician today) What is the patient wearing?:  Non-skid slipper socks, Pants Position: Sitting EOB Underwear - Performed by helper: Thread/unthread right underwear leg, Thread/unthread left underwear leg, Pull underwear up/down Pants- Performed by patient: Thread/unthread right pants leg Pants- Performed by helper: Thread/unthread left pants leg, Pull pants up/down Non-skid slipper socks- Performed by patient: Don/doff right sock Non-skid slipper socks- Performed by helper: Don/doff right sock, Don/doff left sock Socks - Performed by patient: Don/doff left sock Shoes - Performed by patient: Don/doff right shoe Shoes - Performed by helper: Don/doff right shoe, Don/doff left shoe, Fasten right, Fasten left AFO - Performed by helper: Don/doff left AFO Assist for footwear: Maximal assist Assist for lower body dressing: 2 Helpers  Function - Toileting Toileting activity did not occur: No continent bowel/bladder event Toileting steps completed by patient: Adjust clothing prior to toileting Toileting steps completed by helper: Adjust clothing prior to toileting, Performs perineal hygiene, Adjust clothing after toileting Toileting Assistive Devices: Grab bar or rail Assist level: Touching or steadying assistance (Pt.75%)  Function - Air cabin crew transfer activity did not occur:  (using urinal or incont., no BM) Toilet transfer assistive device: Elevated toilet seat/BSC over toilet, Grab bar Mechanical lift: Stedy Assist level to toilet: Moderate assist (Pt 50 - 74%/lift or lower) Assist level from toilet: Moderate assist (Pt 50 - 74%/lift or lower)  Function - Chair/bed transfer Chair/bed transfer method: Squat pivot Chair/bed transfer assist level: Moderate assist (Pt 50 - 74%/lift or lower) Chair/bed transfer assistive device: Armrests Mechanical lift: Stedy Chair/bed transfer details: Manual facilitation for weight shifting, Verbal cues for precautions/safety, Verbal cues for technique, Manual facilitation for  placement  Function - Locomotion: Wheelchair Will patient use wheelchair at discharge?: Yes Type: Manual Max wheelchair distance: 50 Assist Level: Touching or steadying assistance (Pt > 75%) Assist Level: Supervision or verbal cues Assist Level: Supervision or verbal cues Turns around,maneuvers to table,bed, and toilet,negotiates 3% grade,maneuvers on rugs and over doorsills: No Function - Locomotion: Ambulation Ambulation activity did not occur: Safety/medical concerns (pt pushes toward hemi side; unsafe) Assistive device: Rail in hallway, Orthosis Max distance: 10 Assist level: 2 helpers Walk 10  feet activity did not occur: Safety/medical concerns Assist level: 2 helpers Walk 50 feet with 2 turns activity did not occur: Safety/medical concerns Walk 150 feet activity did not occur: Safety/medical concerns Walk 10 feet on uneven surfaces activity did not occur: Safety/medical concerns  Function - Comprehension Comprehension: Auditory Comprehension assist level: Follows basic conversation/direction with no assist  Function - Expression Expression: Verbal Expression assist level: Expresses basic needs/ideas: With no assist  Function - Social Interaction Social Interaction assist level: Interacts appropriately 75 - 89% of the time - Needs redirection for appropriate language or to initiate interaction.  Function - Problem Solving Problem solving assist level: Solves basic problems with no assist  Function - Memory Memory assist level: Recognizes or recalls 75 - 89% of the time/requires cueing 10 - 24% of the time Patient normally able to recall (first 3 days only): Current season, Location of own room, Staff names and faces, That he or she is in a hospital  Medical Problem List and Plan: 1.  Left hemiplegia, limitations with self-care, difficulty with transfers secondary to right MCA CVA.  Team conference today please see physician documentation under team conference tab, met  with team face-to-face to discuss problems,progress, and goals. Formulized individual treatment plan based on medical history, underlying problem and comorbidities.  Has hemorrhagic trasnformation but no worsening of neuro symptoms, as per neuro difficult to ascertain age of findings on CT  Cont CIR PT, OT, speech therapies.  2.  DVT Prophylaxis/Anticoagulation: Pharmaceutical: Lovenox 3. Pain Management: Left hip/groin pain may be spasms , plus minus. Osteoarthritis. Check x-ray, Zanaflex at night  4. Mood: Team to provide ego support. Expressing anxiety about current situation. Has supportive family. LCSW to follow for evaluation and support.  5. Neuropsych: This patient is capable of making decisions on his own behalf. 6. Skin/Wound Care: routine pressure relief measures.  7. Fluids/Electrolytes/Nutrition: Monitor I/O. Check lytes in am. Encourage fluid intake.  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 9. T2DM: Hgb A1C- 10.5 --. Continue metformin 1034m bid. Was started on lantus  45 units daily at bedtime, with SSI for elevated BS--will consult RD for diabetic education and start education on insulin administration.  Will monitor BS ac/hs,at goal 3/28 10.  Acute/subacute right lower lobe pulmonary embolism. Asymptomatic. Will check lower extremity Dopplers. Has IVC filter. Discussed with vascular surgery, who is in favor of anticoagulation, , will have Neuro review repeat CT and advise  Will check LUE Duplex venous [place]G (last 3)   Recent Labs  09/14/16 1557 09/14/16 2134 09/15/16 0637  GLUCAP 92 96 87    10. HTN: Monitor BP bid--on lisinopril 219mdaily.stabilizing in normal range 3/23 Vitals:   09/15/16 0300 09/15/16 0540  BP: 128/70 110/65  Pulse: 78 77  Resp: 18 18  Temp:  98 F (36.7 C)   11. Leucocytosis: Monitor for signs of infection. UA negative. Afebrile,WBC 12.9 12. Polycythemia: Smokes 1.5 PPD. Expect  this to remain elevated during hospitalization, On NicoDerm patch 13. COPD: SOB reported--CXR negative. Encourage tobacco cessation.  Managed without medications at this time.   14. Tobacco abuse: Counsel  14. Hypoalbuminemia, start pro-stat 15. Urinary retention,   BPH, trial of Flomax,  Urecholine stopped no ICP for > 1wk 16. 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 CaDonzetta Mattersesterday. Conservative mgt for now---he spoke to son at length. Dr. CaDonzetta Mattersill follow up with patient in office once he's further recovered from CVA 17.  RLL pulm art branch PE, subacute /acute, has IVC filter no desats or SOB, Given new IVC filter, will look for upper extremity DVT on the left. Given that LUE plegic LOS (Days) 21 A FACE TO FACE EVALUATION WAS PERFORMED  Lashann Hagg E 09/15/2016, 6:58 AM

## 2016-09-15 NOTE — Progress Notes (Signed)
Physical Therapy Session Note  Patient Details  Name: Chad Combs MRN: [id number] Date of Birth: Jan 19, 1956  Today's Date: 09/15/2016 PT Individual Time: 7915-0569 PT Individual Time Calculation (min): 26 min   Short Term Goals:  Week 3:  PT Short Term Goal 1 (Week 3): pt will transfer to R squat pivot with supervision 75% of trials PT Short Term Goal 2 (Week 3): pt will perform gait with LRAD (not railing) x 20' with max assist PT Short Term Goal 3 (Week 3): pt will maintain standing balance during functional activity x 5 minutes with mod assist.  Skilled Therapeutic Interventions/Progress Updates:   Pt received supine in bed and agreeable to PTco-treatment.  Noted to have incontinent bladder movement. bridging to remove and don clean brief. Pt performed perineal hygiene with supervision assist in supine position with set up from PT. Supine>sit transfer with min assist through log roll to the R and max cues for LUE and LLE management.   Squat pivot transfer to the L with min assist from PT and moderate cues for safety and set up .   Gait with max assist at rail x 42ft. WC follow from additional PT for safety. PT donned LAFO prior to gait. Pt noted to be able to assist with improved neutral rotation and prevent constant external rotation as well as improved activation through the quad to prevent knee buckling.         Therapy Documentation Precautions:  Precautions Precautions: Fall Precaution Comments: L sided neglect, shoulder increased risk for subluxation; quick LOB L wihtout awareness Restrictions Weight Bearing Restrictions: No Vital Signs: Oxygen Therapy SpO2: 94 % O2 Device: Not Delivered Pain: Pain Assessment Pain Assessment: No/denies pain   See Function Navigator for Current Functional Status.   Therapy/Group: Individual Therapy  [name] 09/15/2016, 12:31 PM

## 2016-09-15 NOTE — Progress Notes (Signed)
Speech Language Pathology Weekly Progress and Session Note  Patient Details  Name: Chad Combs MRN: [id number] Date of Birth: 11-20-1955  Beginning of progress report period: September 09, 2016 End of progress report period: September 15, 2016  Today's Date: 09/15/2016 SLP Individual Time: 1400-1430 SLP Individual Time Calculation (min): 30 min  Short Term Goals: Week 3: SLP Short Term Goal 1 (Week 3): Given Min A verbal cues, pt will recall 1 activity from previous therapy session during day.  SLP Short Term Goal 1 - Progress (Week 3): Met SLP Short Term Goal 2 (Week 3): Pt will utilize compensatory intelligibility strategies with Min A verbal cues to achieve >90% intelligibility at the simple conversation level.  SLP Short Term Goal 2 - Progress (Week 3): Met SLP Short Term Goal 3 (Week 3): Pt will demonstrate sustained attention in moderately distracting environment for ~30 minutes with Min A verbal cues.  SLP Short Term Goal 3 - Progress (Week 3): Not met SLP Short Term Goal 4 (Week 3): Pt will complete basic, familiar problem solving tasks with Mod A verbal cues.  SLP Short Term Goal 4 - Progress (Week 3): Not met SLP Short Term Goal 5 (Week 3): Pt will scan to left of his environment to locate items within basic, familiar tasks with Mod A verbal cues.  SLP Short Term Goal 5 - Progress (Week 3): Not met SLP Short Term Goal 6 (Week 3): Pt will demonstrate anticipatory awareness by identifying 3 activites that are safe to participate in within home environement with Mod A verbal cues.  SLP Short Term Goal 6 - Progress (Week 3): Not met    New Short Term Goals: Week 4: SLP Short Term Goal 1 (Week 4): Pt will demonstrate sustained attention in moderately distracting environment for ~30 minutes with Min A verbal cues.  SLP Short Term Goal 2 (Week 4): Pt will complete basic, familiar problem solving tasks with Mod A verbal cues.  SLP Short Term Goal 3 (Week 4): Pt will scan to left of his  environment to locate items within basic, familiar tasks with Mod A verbal cues.  SLP Short Term Goal 4 (Week 4): Pt will demonstrate anticipatory awareness by identifying 3 activites that are safe to participate in within home environement with Mod A verbal cues.   Weekly Progress Updates: Pt with minimal progress this week but has met 2 of 6 LTGs. Pt continues to make progress in the areas of speech intelligibility and recall of daily information. Pt continues to have difficulty with sustained attention, attention to left of environment, anticipatory awareness and consistently demonstrating basic problem solving. Skilled ST is required to address these deficits in order to increase functional independence and reduce caregiver burden.      Intensity: Minumum of 1-2 x/day, 30 to 90 minutes Frequency: 3 to 5 out of 7 days Duration/Length of Stay: Pending SNF placement Treatment/Interventions: Cognitive remediation/compensation;Cueing hierarchy;Environmental controls;Functional tasks;Internal/external aids;Patient/family education;Speech/Language facilitation;Therapeutic Activities   Daily Session  Skilled Therapeutic Interventions: Skilled treatment session focused on cognition goals. SLP facilitated session by providing Max A to Mod A for completion of basic familiar task and Max A to visually scan to left of environment. Pt was returned to room, left upright in wheelchair, safety belt donned, chair alarm on and all needs within reach. Continue per current plan of care.    Function:   Eating Eating   Modified Consistency Diet: No Eating Assist Level: Set up assist for;Supervision or verbal cues   Eating  Set Up Assist For: Opening containers       Cognition Comprehension Comprehension assist level: Follows basic conversation/direction with no assist  Expression   Expression assist level: Expresses basic needs/ideas: With extra time/assistive device  Social Interaction Social  Interaction assist level: Interacts appropriately 75 - 89% of the time - Needs redirection for appropriate language or to initiate interaction.  Problem Solving Problem solving assist level: Solves basic 50 - 74% of the time/requires cueing 25 - 49% of the time  Memory Memory assist level: Recognizes or recalls 50 - 74% of the time/requires cueing 25 - 49% of the time   General    Pain    Therapy/Group: Individual Therapy   Akari Crysler B. [name], M.S., CCC-SLP Speech-Language Pathologist   Raveen Wieseler 09/15/2016, 4:09 PM

## 2016-09-15 NOTE — Progress Notes (Signed)
Speech Language Pathology Daily Session Note  Patient Details  Name: Chad Combs MRN: [id number] Date of Birth: 04-13-1956  Today's Date: 09/15/2016 SLP Individual Time: 0900-0930 SLP Individual Time Calculation (min): 30 min  Short Term Goals: Week 3: SLP Short Term Goal 1 (Week 3): Given Min A verbal cues, pt will recall 1 activity from previous therapy session during day.  SLP Short Term Goal 2 (Week 3): Pt will utilize compensatory intelligibility strategies with Min A verbal cues to achieve >90% intelligibility at the simple conversation level.  SLP Short Term Goal 3 (Week 3): Pt will demonstrate sustained attention in moderately distracting environment for ~30 minutes with Min A verbal cues.  SLP Short Term Goal 4 (Week 3): Pt will complete basic, familiar problem solving tasks with Mod A verbal cues.  SLP Short Term Goal 5 (Week 3): Pt will scan to left of his environment to locate items within basic, familiar tasks with Mod A verbal cues.  SLP Short Term Goal 6 (Week 3): Pt will demonstrate anticipatory awareness by identifying 3 activites that are safe to participate in within home environement with Mod A verbal cues.   Skilled Therapeutic Interventions: Patient fatigued from testing over night and wanted to stay in bed for therapy this morning, patient able to recall the test occurred but not the results.  Skilled treatment session focused on addressing cognition  goals. SLP facilitated session by providing skilled observation of patient consuming pills whole in puree one at a time with timely oral clearance and no overt s/s of aspiration. SLP also provided set-up of regular texture trials on patient's left which patient consumed with increased time, intermittent use of thin liquids wash, and no overt s/s of aspiration.  However, he required Max faded to Mod assist verbal cues to locate cracker placed on the far left of his bedside table.  Patient attempted to compensate by moving  crackers to his right; however, given focus on scanning left was agreeable to leave them there for this task.  Continue with current plan of care.    Function:  Eating Eating   Modified Consistency Diet: No (trials with SLP) Eating Assist Level: Set up assist for;Supervision or verbal cues;Helper checks for pocketed food   Eating Set Up Assist For: Opening containers       Cognition Comprehension Comprehension assist level: Follows basic conversation/direction with no assist  Expression   Expression assist level: Expresses basic needs/ideas: With extra time/assistive device  Social Interaction Social Interaction assist level: Interacts appropriately 75 - 89% of the time - Needs redirection for appropriate language or to initiate interaction.  Problem Solving Problem solving assist level: Solves basic 50 - 74% of the time/requires cueing 25 - 49% of the time  Memory Memory assist level: Recognizes or recalls 50 - 74% of the time/requires cueing 25 - 49% of the time    Pain Pain Assessment Pain Assessment: No/denies pain  Therapy/Group: Individual Therapy  Carmelia Roller., CCC-SLP [phone]  [place] 09/15/2016, 9:27 AM

## 2016-09-15 NOTE — Progress Notes (Addendum)
Occupational Therapy Note  Patient Details  Name: Chad Combs MRN: [id number] Date of Birth: 07-31-1955  OT Missed Time: 75 minutes  Attempted to see pt at 07:30 for scheduled OT session. Pt voiced increased fatigue from going off unit for CT scan in middle of night. Pt requesting to keep resting at this time. Left in supine with all needs in reach. Will attempt to make up time as able, continue with POC.    Lewis, Jinny Sweetland C 09/15/2016, 8:50 AM

## 2016-09-15 NOTE — Patient Care Conference (Signed)
Inpatient RehabilitationTeam Conference and Plan of Care Update Date: 09/15/2016   Time: 10:45 AM    Patient Name: Chad Combs      Medical Record Number: [id number]  Date of Birth: [birth year] Sex: Male         Room/Bed: 4W11C/4W11C-01 Payor Info: Payor: /    Admitting Diagnosis: R CVA  Admit Date/Time:  08/25/2016  2:36 PM Admission Comments: No comment available   Primary Diagnosis:  <principal problem not specified> Principal Problem: <principal problem not specified>  Patient Active Problem List   Diagnosis Date Noted  . Acute deep vein thrombosis (DVT) of femoral vein of left lower extremity ([place])   . AAA (abdominal aortic aneurysm) ([place])   . Pulmonary embolus ([place])   . Adjustment disorder   . Acute right MCA stroke ([place]) 08/25/2016  . Left hemiplegia ([place])   . Dysarthria, post-stroke   . Dysphagia, post-stroke   . Occlusion and stenosis of carotid artery   . Diabetes mellitus ([place])   . Benign essential HTN   . Leukocytosis   . Polycythemia vera ([place])   . Tobacco abuse   . Chronic obstructive pulmonary disease ([place])   . CVA (cerebral vascular accident) ([place]) 08/21/2016  . Hypertension 07/04/2015  . Diabetes mellitus without complication ([place]) 65/78/4696  . 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 COPD (chronic obstructive pulmonary disease) ([place]) 07/04/2015    Expected Discharge Date: Expected Discharge Date: 09/21/16  Team Members Present: Physician leading conference: Ilean Skill, PsyD;Dr. Alysia Penna Social Worker Present: Ovidio Kin, LCSW Nurse Present: Elliot Cousin, RN PT Present: Phylliss Bob, PTA;Rodney Renee Ramus, PT OT Present: Napoleon Form, OT SLP Present: Stormy Fabian, SLP PPS Coordinator present : Daiva Nakayama, RN, CRRN     Current Status/Progress Goal Weekly Team Focus  Medical   no new DVT, has ICH and new PE, asymptomatic, incont urine  manage PE and anticoag  see above   Bowel/Bladder   pt is continent in day and incontinent  at times when sleeping at night  continue progress with continence at night  check with patient every 3-4 hours to see if tolieting is needed   Swallow/Nutrition/ Hydration   Goal met, dysphagia 3 with thin liquids, full nursing supervision for cognitive inawareness for complete oral clearing  Supervision with least restrictive diet  Use of compensatory swallow strategies   ADL's   Min A UB bathing/dressing; max A LB dressing; Min A squat pivot transfering to strong side.   min assist to supervision  ADL re-training; neuro re-ed; functional activity tolerance; functional transfers   Mobility   maxA gait 52f, minA transfers, modA w/c mobility   modA functional transfers, w/c mobilty  NMR, functional transfers   Communication   Superviison at the simple conversation level with Mod I  Supervision - goal met  Use of speech intelligibility strategies at the simple conversation level   Safety/Cognition/ Behavioral Observations  Mod A for basic problem solving, left inattention and intellectual awareness  Min A  Completion of basic problem solving tasks, strategies for left inattention   Pain   pt denies pain  continue pain free  continue with apin free status   Skin   no skin issues  continue breakdown and injury free  continue with current status      *See Care Plan and progress notes for long and short-term goals.  Barriers to Discharge: impulsivity, left neglect see above    Possible Resolutions to Barriers:  may need IV heparin  Discharge Planning/Teaching Needs:  Son feels pt will be too much care for them to manage at home and only has three weeks of 24 hr care coverage, wants to pursue NHP      Team Discussion:  Progressing slowly toward his goals will require 24 hr physical care at discharge. Medical findings-PE, ICH and anneurysm. Neuro and vascular consulted and waiting on recommendations. Pt is asymptomatic. AFO for transfer's L-shoulder pain MD to address. Cont daytime  working on nighttime. Too much care for family changed to NHP  Revisions to Treatment Plan:  DC changed to NHP   Continued Need for Acute Rehabilitation Level of Care: The patient requires daily medical management by a physician with specialized training in physical medicine and rehabilitation for the following conditions: Daily direction of a multidisciplinary physical rehabilitation program to ensure safe treatment while eliciting the highest outcome that is of practical value to the patient.: Yes Daily medical management of patient stability for increased activity during participation in an intensive rehabilitation regime.: Yes Daily analysis of laboratory values and/or radiology reports with any subsequent need for medication adjustment of medical intervention for : Neurological problems;Mood/behavior problems  Kinzlie Harney, [name] 09/16/2016, 8:26 AM

## 2016-09-15 NOTE — Progress Notes (Signed)
ANTICOAGULATION CONSULT NOTE - Initial Consult  Pharmacy Consult for Heparin Indication: pulmonary embolus and DVT  No Known Allergies  Patient Measurements: Height: 5' 9"  (175.3 cm) Weight: 202 lb 8.6 oz (91.9 kg) IBW/kg (Calculated) : 70.7 Heparin Dosing Weight: 89  Vital Signs: Temp: 97.7 F (36.5 C) (03/28 1309) Temp Source: Oral (03/28 1309) BP: 137/74 (03/28 1309) Pulse Rate: 86 (03/28 1309)  Labs:  Recent Labs  09/15/16 0541  HGB 15.5  HCT 45.5  PLT 198  CREATININE 0.86    Estimated Creatinine Clearance: 101 mL/min (by C-G formula based on SCr of 0.86 mg/dL).   Medical History: Past Medical History:  Diagnosis Date  . Blind right eye   . COPD (chronic obstructive pulmonary disease) ([place])   . Diabetes mellitus without complication ([place])   . Hypertension     Medications:  Scheduled:  . atorvastatin  40 mg Oral q1800  . bethanechol  10 mg Oral TID  . ezetimibe  10 mg Oral q1800  . famotidine  20 mg Oral BID  . feeding supplement (PRO-STAT SUGAR FREE 64)  30 mL Oral BID  . fluticasone  1 spray Each Nare Daily  . gabapentin  100 mg Oral QHS  . insulin aspart  0-20 Units Subcutaneous TID WC & HS  . insulin glargine  45 Units Subcutaneous Daily  . insulin starter kit- pen needles  1 kit Other Once  . lisinopril  20 mg Oral Daily  . metFORMIN  1,000 mg Oral BID WC  . mometasone-formoterol  2 puff Inhalation BID  . nicotine  21 mg Transdermal Daily  . tamsulosin  0.4 mg Oral QPC supper  . tiZANidine  2 mg Oral QHS    Assessment: 61yo male who had been diagnosed with DVT 04/2016 but had run out of his Eliquis and had a stroke on 08/21/16, IVC filter on 08/24/16, then transferred [facility] CIR at [facility] on 08/25/16.  Pt has been receiving ASA 46m and Clopidogrel 748msince 3/8, now found to have PE on CTA 3/26, subacute LLE DVT per dopplers 09/14/16 and small hemorrhagic conversion of stroke on head CT 3/28.  Neuro has seen patient and approved heparin for PE, with low  goal and no bolus dosing.  Plan transition to NOAC in 2-3 days if neuro remains stable.  Goal of Therapy:  Heparin level 0.3-0.5 units/ml Monitor platelets by anticoagulation protocol: Yes   Plan:  Heparin 1300 units/hr Check heparin level 8hr Daily heparin level, CBC Watch for s/s of bleeding, change in status   Ryliegh Mcduffey P 09/15/2016,5:12 PM

## 2016-09-15 NOTE — Progress Notes (Signed)
Physical Therapy Session Note  Patient Details  Name: Chad Combs MRN: [id number] Date of Birth: 05-19-1956  Today's Date: 09/15/2016 PT Individual Time: 1133-1200 PT Individual Time Calculation (min): 27 min   Short Term Goals: Week 3:  PT Short Term Goal 1 (Week 3): pt will transfer to R squat pivot with supervision 75% of trials PT Short Term Goal 2 (Week 3): pt will perform gait with LRAD (not railing) x 20' with max assist PT Short Term Goal 3 (Week 3): pt will maintain standing balance during functional activity x 5 minutes with mod assist.  Skilled Therapeutic Interventions/Progress Updates: Co-tx with primary PT, continued practice gait training. Pt modA sit to stand with wall rail. Attempted gait maxA x 1 and chair follow. Pt required total assist for advancing LLE, attempting facilitation of quad however inconsistent activation. Pt with increased L external rotation of LLE requiring PTA correction. Pt ambulated approx 5 ft before ceasing due to decreased safety. Pt transferred to L onto mat with modA. Performed seated dynamic reaching with improved R trunk activation for orientation to midline. Sit to stand with use of HW max tactile cues for visual feedback to achieve midline orientation. Pt squat pivot to R with mod/maxA and transported back to room. Pt left in w/c at end of session with QRB and chair alarm on and needs met.       Therapy Documentation Precautions:  Precautions Precautions: Fall Precaution Comments: L sided neglect, shoulder increased risk for subluxation; quick LOB L wihtout awareness Restrictions Weight Bearing Restrictions: No General:   Vital Signs: Therapy Vitals Temp: 97.7 F (36.5 C) Temp Source: Oral Pulse Rate: 86 Resp: 18 BP: 137/74 Patient Position (if appropriate): Sitting Oxygen Therapy SpO2: 96 % O2 Device: Not Delivered Pain:   Other Treatments:     See Function Navigator for Current Functional Status.   Therapy/Group:  Individual Therapy  Chariah Bailey  Tijuan Dantes, PTA  09/15/2016, 3:05 PM

## 2016-09-15 NOTE — Progress Notes (Signed)
Pt taken for CT scan at 250am and returned to [place] without complaint. Alert oriented X4, vital signs are stable and recorded in vital sign assessments charting. Pt was given water to drink, call bell, and made sure to be comfortable. Resting and ready to go back to sleep.

## 2016-09-16 ENCOUNTER — Ambulatory Visit (HOSPITAL_COMMUNITY): Payer: Self-pay

## 2016-09-16 ENCOUNTER — Inpatient Hospital Stay (HOSPITAL_COMMUNITY): Payer: Self-pay | Admitting: Physical Therapy

## 2016-09-16 ENCOUNTER — Inpatient Hospital Stay (HOSPITAL_COMMUNITY): Payer: Self-pay | Admitting: Speech Pathology

## 2016-09-16 ENCOUNTER — Inpatient Hospital Stay (HOSPITAL_COMMUNITY): Payer: Self-pay | Admitting: Occupational Therapy

## 2016-09-16 DIAGNOSIS — I2699 Other pulmonary embolism without acute cor pulmonale: Secondary | ICD-10-CM

## 2016-09-16 DIAGNOSIS — I82412 Acute embolism and thrombosis of left femoral vein: Secondary | ICD-10-CM

## 2016-09-16 LAB — GLUCOSE, CAPILLARY
GLUCOSE-CAPILLARY: 127 mg/dL — AB (ref 65–99)
GLUCOSE-CAPILLARY: 115 mg/dL — AB (ref 65–99)
Glucose-Capillary: 111 mg/dL — ABNORMAL HIGH (ref 65–99)
Glucose-Capillary: 126 mg/dL — ABNORMAL HIGH (ref 65–99)
Glucose-Capillary: 40 mg/dL — CL (ref 65–99)

## 2016-09-16 LAB — HEPARIN LEVEL (UNFRACTIONATED)
HEPARIN UNFRACTIONATED: 0.13 [IU]/mL — AB (ref 0.30–0.70)
HEPARIN UNFRACTIONATED: 0.48 [IU]/mL (ref 0.30–0.70)
Heparin Unfractionated: 0.49 IU/mL (ref 0.30–0.70)

## 2016-09-16 LAB — CBC
HCT: 45.8 % (ref 39.0–52.0)
Hemoglobin: 15.6 g/dL (ref 13.0–17.0)
MCH: 33.1 pg (ref 26.0–34.0)
MCHC: 34.1 g/dL (ref 30.0–36.0)
MCV: 97 fL (ref 78.0–100.0)
PLATELETS: 194 10*3/uL (ref 150–400)
RBC: 4.72 MIL/uL (ref 4.22–5.81)
RDW: 11.9 % (ref 11.5–15.5)
WBC: 11.9 10*3/uL — AB (ref 4.0–10.5)

## 2016-09-16 NOTE — Progress Notes (Signed)
Physical Therapy Weekly Progress Note  Patient Details  Name: Chad Combs MRN: [id number] Date of Birth: 04-02-1956  Beginning of progress report period: September 09, 2016 End of progress report period: September 16, 2016  Today's Date: 09/16/2016 PT Individual Time: 0901-1029 PT Individual Time Calculation (min): 88 min   Patient has met 1 of 3 short term goals.  Continued Hemiplegia with only minor motor return in the LLE and LUE has prevented increased progress towards goals over the past week. Pt also reports being overwhelmed with new diagnosis of PE, abdominal aneurysm, CT scan showing intraparenchymal Hemorrhage.    Patient continues to demonstrate the following deficits muscle weakness, decreased cardiorespiratoy endurance, impaired timing and sequencing, abnormal tone, unbalanced muscle activation and decreased motor planning, decreased visual acuity, decreased safety awareness and delayed processing and decreased standing balance, decreased postural control, hemiplegia and decreased balance strategies and therefore will continue to benefit from skilled PT intervention to increase functional independence with mobility.  Patient progressing toward long term goals..  Continue plan of care.  PT Short Term Goals Week 3:  PT Short Term Goal 1 (Week 3): pt will transfer to R squat pivot with supervision 75% of trials PT Short Term Goal 1 - Progress (Week 3): Progressing toward goal PT Short Term Goal 2 (Week 3): pt will perform gait with LRAD (not railing) x 20' with max assist PT Short Term Goal 2 - Progress (Week 3): Not met PT Short Term Goal 3 (Week 3): pt will maintain standing balance during functional activity x 5 minutes with mod assist. PT Short Term Goal 3 - Progress (Week 3): Met Week 4:  PT Short Term Goal 1 (Week 4): STG =LTG due to ELSO  Skilled Therapeutic Interventions/Progress Updates:   Pt received sitting in WC and agreeable to PT  WC mobility in various environments  including hall of rehab unit in hospital, simulated community environment of gift shop, crowded hall way at hospital entrance. Supervision assist from PT throughout with min cues for awareness of L side and for improved visual scanning.   PT instructed pt in blocked practice squat pivot transfers to L and R with min assist consistently as well as only min cues for prop set up and LE positioning.   Bed mobility with min assist to control the LLE inclduing rolling L and R and supine to sit. Max verbal cues for of r proper use of the BUE to come to sitting position. Forced use  Of the LUE to L ateral lean to elbow to sitting upright, PT kept head in neutral position to prevent increased use of R side to comensate. Only mild activation noted in the LUE  Supine NMR hip flexion/extension in gravity minimized position. Hip adduction/abduction with mild resistance from PT. Bridges x 10, heel slides with AAROM on the LLE. Dynamic reversals and stabilizing reversals, Trunk and lumbar rotation x 10 L and R at each segment.   Sit to semi squat x 5 with forced use of LLE, pt reports significant increase in pain in the L thing at various locaitons. PT used 1# bar weight to massage trigger points in quadriceps and HS.   Patient returned too room and left sitting in [facility] with call bell in reach and all needs met.          Therapy Documentation Precautions:  Precautions Precautions: Fall Precaution Comments: L sided neglect, shoulder increased risk for subluxation; quick LOB L wihtout awareness Restrictions Weight Bearing Restrictions: No Vital Signs:  Oxygen Therapy SpO2: 97 % O2 Device: Not Delivered Pain: Pain Assessment Pain Assessment: No/denies pain Pain Score: 0-No pain  See Function Navigator for Current Functional Status.  Therapy/Group: Individual Therapy  [name] 09/16/2016, 10:32 AM

## 2016-09-16 NOTE — Progress Notes (Signed)
Occupational Therapy Session Note  Patient Details  Name: Chad Combs MRN: [id number] Date of Birth: 09-12-1955  Today's Date: 09/16/2016 OT Individual Time: 1400-1500 OT Individual Time Calculation (min): 60 min    Short Term Goals: Week 3:  OT Short Term Goal 1 (Week 3): Pt will consistently complete transfer to toilet with mod A of 1 person A only. OT Short Term Goal 2 (Week 3): Pt will be able to pull pants over hips himself with min A to stabilize balance. OT Short Term Goal 3 (Week 3): Pt will be able to wash R and L leg arm using a long handled sponge with S. OT Short Term Goal 4 (Week 3): Pt will be able to wt shift to L side safely to wash bottom with lateral lean on tub bench with steadying A.  Skilled Therapeutic Interventions/Progress Updates:    Pt seen for OT session focusing on neuromosucar re-education with movements in R UE and on functional transfers. Pt in supine upon arrival, voicing increased fatigue, however, agreeable to tx session.  From bed level, completed AAROM from supine and sidelying position with gravity supported movements. With multimodal cuing, pt displayed active movement in shoulder flexion and bicep/tricep activation. Pt noted to have increased tone in R UE, however, with increased time able to break tone to cont with movement. Pt required mod- max steadying assist at wrist and elbow when completing movements in gravity eliminated position.  Transferred to EOB to continue UE movements in unsupported sitting. Remainder of session focusing on functional squat pivot transfers initially bed <> BSC and then w/c <> BSC placed over toilet. Pt required overall min A with squat pivot transfers, with mod-max cuing for LE placement awareness, head/hip relationship, and cues to slow down as pt actively aware he is much safer with transfer when going slow. Pt able to stand with min A, min-mod A standing balance for therapist to complete clothing management during simulated  toileting task. Pt requesting to return to supine at end of session, left with all needs in reach and bed alarm on.   Therapy Documentation Precautions:  Precautions Precautions: Fall Precaution Comments: L sided neglect, shoulder increased risk for subluxation; quick LOB L wihtout awareness Restrictions Weight Bearing Restrictions: No Pain:   No/denies pain   See Function Navigator for Current Functional Status.   Therapy/Group: Individual Therapy  Lewis, Melody Savidge C 09/16/2016, 7:15 AM

## 2016-09-16 NOTE — Progress Notes (Signed)
Subjective/Complaints:  Patient denies any shortness of breath, no abdominal pain, no leg pain No arm pain, negative upper extremity venous duplex  ROS: pt denies nausea, vomiting, diarrhea, cough, shortness of breath or chest pain    Objective: Vital Signs: Blood pressure 131/76, pulse (!) 43, temperature 98.7 F (37.1 C), temperature source Oral, resp. rate 18, height 5' 9"  (1.753 m), weight 90.7 kg (199 lb 14.4 oz), SpO2 98 %. Ct Head Wo Contrast  Result Date: 09/15/2016 CLINICAL DATA:  61 y/o  M; follow-up of intracranial hemorrhage. EXAM: CT HEAD WITHOUT CONTRAST TECHNIQUE: Contiguous axial images were obtained from the base of the skull through the vertex without intravenous contrast. COMPARISON:  09/14/2016 CT of the head. FINDINGS: Brain: Stable distribution of right MCA infarction with stable small foci of hemorrhage. Mild local mass effect with partial effacement of the right lateral ventricle. No evidence for new large acute infarction, new intracranial hemorrhage, or new focal mass effect. Vascular: Moderate calcific atherosclerosis of cavernous and paraclinoid internal carotid arteries. Skull: Normal. Negative for fracture or focal lesion. Sinuses/Orbits: Small chronic right lamina papyracea fracture with herniation of extra orbital fat. Small left anterior ethmoid air cell mucous retention cyst. Visualized paranasal sinuses and mastoid air cells are otherwise normally aerated. Orbits are unremarkable. Other: None. IMPRESSION: Stable right MCA distribution infarct and small foci of hemorrhagic conversion. Stable mild local mass effect. No herniation. No new acute intracranial abnormality identified. Electronically Signed   By: Kristine Garbe M.D.   On: 09/15/2016 03:06   Results for orders placed or performed during the hospital encounter of 08/25/16 (from the past 72 hour(s))  Glucose, capillary     Status: Abnormal   Collection Time: 09/13/16  6:35 AM  Result Value Ref  Range   Glucose-Capillary 115 (H) 65 - 99 mg/dL  Glucose, capillary     Status: Abnormal   Collection Time: 09/13/16 12:16 PM  Result Value Ref Range   Glucose-Capillary 157 (H) 65 - 99 mg/dL  Glucose, capillary     Status: Abnormal   Collection Time: 09/13/16  4:31 PM  Result Value Ref Range   Glucose-Capillary 103 (H) 65 - 99 mg/dL  Glucose, capillary     Status: Abnormal   Collection Time: 09/13/16  9:47 PM  Result Value Ref Range   Glucose-Capillary 110 (H) 65 - 99 mg/dL  Glucose, capillary     Status: Abnormal   Collection Time: 09/14/16  6:36 AM  Result Value Ref Range   Glucose-Capillary 119 (H) 65 - 99 mg/dL  Glucose, capillary     Status: Abnormal   Collection Time: 09/14/16 11:37 AM  Result Value Ref Range   Glucose-Capillary 165 (H) 65 - 99 mg/dL  Glucose, capillary     Status: None   Collection Time: 09/14/16  3:57 PM  Result Value Ref Range   Glucose-Capillary 92 65 - 99 mg/dL  Glucose, capillary     Status: None   Collection Time: 09/14/16  9:34 PM  Result Value Ref Range   Glucose-Capillary 96 65 - 99 mg/dL  Basic metabolic panel     Status: None   Collection Time: 09/15/16  5:41 AM  Result Value Ref Range   Sodium 137 135 - 145 mmol/L   Potassium 3.5 3.5 - 5.1 mmol/L   Chloride 104 101 - 111 mmol/L   CO2 24 22 - 32 mmol/L   Glucose, Bld 86 65 - 99 mg/dL   BUN 16 6 - 20 mg/dL   Creatinine, Ser  0.86 0.61 - 1.24 mg/dL   Calcium 9.1 8.9 - 10.3 mg/dL   GFR calc non Af Amer >60 >60 mL/min   GFR calc Af Amer >60 >60 mL/min    Comment: (NOTE) The eGFR has been calculated using the CKD EPI equation. This calculation has not been validated in all clinical situations. eGFR's persistently <60 mL/min signify possible Chronic Kidney Disease.    Anion gap 9 5 - 15  CBC     Status: Abnormal   Collection Time: 09/15/16  5:41 AM  Result Value Ref Range   WBC 11.6 (H) 4.0 - 10.5 K/uL   RBC 4.68 4.22 - 5.81 MIL/uL   Hemoglobin 15.5 13.0 - 17.0 g/dL   HCT 45.5 39.0  - 52.0 %   MCV 97.2 78.0 - 100.0 fL   MCH 33.1 26.0 - 34.0 pg   MCHC 34.1 30.0 - 36.0 g/dL   RDW 12.0 11.5 - 15.5 %   Platelets 198 150 - 400 K/uL  Glucose, capillary     Status: None   Collection Time: 09/15/16  6:37 AM  Result Value Ref Range   Glucose-Capillary 87 65 - 99 mg/dL  Glucose, capillary     Status: Abnormal   Collection Time: 09/15/16 12:02 PM  Result Value Ref Range   Glucose-Capillary 152 (H) 65 - 99 mg/dL   Comment 1 Notify RN   Glucose, capillary     Status: Abnormal   Collection Time: 09/15/16  4:22 PM  Result Value Ref Range   Glucose-Capillary 101 (H) 65 - 99 mg/dL   Comment 1 Notify RN   Glucose, capillary     Status: Abnormal   Collection Time: 09/15/16  9:04 PM  Result Value Ref Range   Glucose-Capillary 111 (H) 65 - 99 mg/dL  Heparin level (unfractionated)     Status: Abnormal   Collection Time: 09/16/16  2:16 AM  Result Value Ref Range   Heparin Unfractionated 0.13 (L) 0.30 - 0.70 IU/mL    Comment:        IF HEPARIN RESULTS ARE BELOW EXPECTED VALUES, AND PATIENT DOSAGE HAS BEEN CONFIRMED, SUGGEST FOLLOW UP TESTING OF ANTITHROMBIN III LEVELS.   CBC     Status: Abnormal   Collection Time: 09/16/16  2:16 AM  Result Value Ref Range   WBC 11.9 (H) 4.0 - 10.5 K/uL   RBC 4.72 4.22 - 5.81 MIL/uL   Hemoglobin 15.6 13.0 - 17.0 g/dL   HCT 45.8 39.0 - 52.0 %   MCV 97.0 78.0 - 100.0 fL   MCH 33.1 26.0 - 34.0 pg   MCHC 34.1 30.0 - 36.0 g/dL   RDW 11.9 11.5 - 15.5 %   Platelets 194 150 - 400 K/uL     HEENT: normal Cardio: RRR Resp: CTA bilaterally GI: BS + Extremity:  No edema Skin:   Intact Neuro:  Flat, Abnormal Sensory Abnormal Motor Motor is 5/5 in the right deltoid, biceps, triceps, grip, hip flexor, knee extensor, ankle dorsiflexor, trace left biceps and finger flexors, 2 minus, left hip, knee extensor synergy, 0 at the ankle, Abnormal FMC Ataxic/ dec FMC and Dysarthric Musc/Skel:  Other No pain with upper extremity or lower extremity range of  motion Gen. no acute distress   Assessment/Plan: 1. Functional deficits secondary to Right MCA infarct with Left Hemiparesis which require 3+ hours per day of interdisciplinary therapy in a comprehensive inpatient rehab setting. Physiatrist is providing close team supervision and 24 hour management of active medical problems listed below. Physiatrist  and rehab team continue to assess barriers to discharge/monitor patient progress toward functional and medical goals. FIM: Function - Bathing Position: Sitting EOB Body parts bathed by patient: Left arm, Chest, Abdomen, Front perineal area, Right upper leg, Left upper leg Body parts bathed by helper: Right arm, Buttocks, Back, Right lower leg, Left lower leg Assist Level: Touching or steadying assistance(Pt > 75%)  Function- Upper Body Dressing/Undressing What is the patient wearing?: Button up shirt Pull over shirt/dress - Perfomed by patient: Thread/unthread right sleeve, Put head through opening, Pull shirt over trunk Pull over shirt/dress - Perfomed by helper: Thread/unthread left sleeve Button up shirt - Perfomed by patient: Thread/unthread right sleeve, Thread/unthread left sleeve Button up shirt - Perfomed by helper: Pull shirt around back, Button/unbutton shirt Assist Level: Touching or steadying assistance(Pt > 75%) Function - Lower Body Dressing/Undressing Lower body dressing/undressing activity did not occur:  (did not occur with this clinician today) What is the patient wearing?: Non-skid slipper socks, Pants Position: Sitting EOB Underwear - Performed by helper: Thread/unthread right underwear leg, Thread/unthread left underwear leg, Pull underwear up/down Pants- Performed by patient: Thread/unthread right pants leg Pants- Performed by helper: Thread/unthread left pants leg, Pull pants up/down Non-skid slipper socks- Performed by patient: Don/doff right sock Non-skid slipper socks- Performed by helper: Don/doff right sock,  Don/doff left sock Socks - Performed by patient: Don/doff left sock Shoes - Performed by patient: Don/doff right shoe Shoes - Performed by helper: Don/doff right shoe, Don/doff left shoe, Fasten right, Fasten left AFO - Performed by helper: Don/doff left AFO Assist for footwear: Maximal assist Assist for lower body dressing: 2 Helpers  Function - Toileting Toileting activity did not occur: No continent bowel/bladder event Toileting steps completed by patient: Adjust clothing prior to toileting Toileting steps completed by helper: Adjust clothing prior to toileting, Performs perineal hygiene, Adjust clothing after toileting Toileting Assistive Devices: Grab bar or rail Assist level: Touching or steadying assistance (Pt.75%)  Function - Air cabin crew transfer activity did not occur:  (using urinal or incont., no BM) Toilet transfer assistive device: Elevated toilet seat/BSC over toilet, Grab bar Mechanical lift: Stedy Assist level to toilet: Moderate assist (Pt 50 - 74%/lift or lower) Assist level from toilet: Moderate assist (Pt 50 - 74%/lift or lower)  Function - Chair/bed transfer Chair/bed transfer method: Squat pivot Chair/bed transfer assist level: Moderate assist (Pt 50 - 74%/lift or lower) Chair/bed transfer assistive device: Armrests Mechanical lift: Stedy Chair/bed transfer details: Manual facilitation for weight shifting, Verbal cues for precautions/safety, Verbal cues for technique, Manual facilitation for placement  Function - Locomotion: Wheelchair Will patient use wheelchair at discharge?: Yes Type: Manual Max wheelchair distance: 50 Assist Level: Touching or steadying assistance (Pt > 75%) Assist Level: Supervision or verbal cues Assist Level: Supervision or verbal cues Turns around,maneuvers to table,bed, and toilet,negotiates 3% grade,maneuvers on rugs and over doorsills: No Function - Locomotion: Ambulation Ambulation activity did not occur:  Safety/medical concerns (pt pushes toward hemi side; unsafe) Assistive device: Rail in hallway, Orthosis Max distance: 10 Assist level: 2 helpers Walk 10 feet activity did not occur: Safety/medical concerns Assist level: 2 helpers Walk 50 feet with 2 turns activity did not occur: Safety/medical concerns Walk 150 feet activity did not occur: Safety/medical concerns Walk 10 feet on uneven surfaces activity did not occur: Safety/medical concerns  Function - Comprehension Comprehension: Auditory Comprehension assist level: Follows basic conversation/direction with no assist  Function - Expression Expression: Verbal Expression assist level: Expresses basic needs/ideas: With extra time/assistive device  Function - Social Interaction Social Interaction assist level: Interacts appropriately 75 - 89% of the time - Needs redirection for appropriate language or to initiate interaction.  Function - Problem Solving Problem solving assist level: Solves basic 50 - 74% of the time/requires cueing 25 - 49% of the time  Function - Memory Memory assist level: Recognizes or recalls 50 - 74% of the time/requires cueing 25 - 49% of the time Patient normally able to recall (first 3 days only): Current season, Location of own room, Staff names and faces, That he or she is in a hospital  Medical Problem List and Plan: 1.  Left hemiplegia, limitations with self-care, difficulty with transfers secondary to right MCA CVA. Appreciate neuro note  Cont CIR PT, OT, speech therapies.  2.  DVT Prophylaxis/Anticoagulation: Pharmaceutical: Lovenox 3. Pain Management: Left hip/groin pain may be spasms , plus minus. Osteoarthritis. Check x-ray, Zanaflex at night  4. Mood: Team to provide ego support. Expressing anxiety about current situation. Has supportive family. LCSW to follow for evaluation and support.  5. Neuropsych: This patient is capable of making decisions on his own behalf. 6. Skin/Wound Care: routine  pressure relief measures.  7. Fluids/Electrolytes/Nutrition: Monitor I/O. Check lytes in am. Encourage fluid intake.  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 9. T2DM: Hgb A1C- 10.5 --. Continue metformin 1079m bid. Was started on lantus  45 units daily at bedtime, with SSI for elevated BS--will consult RD for diabetic education and start education on insulin administration.  Will monitor BS ac/hs,at goal 3/28 10.  Acute/subacute right lower lobe pulmonary embolism. Start heparin per neuro and transition to NOAC if pt remain neurologically stable CBG (last 3)   Recent Labs  09/15/16 1202 09/15/16 1622 09/15/16 2104  GLUCAP 152* 101* 111*    10. HTN: Monitor BP bid--on lisinopril 249mdaily.stabilizing in normal range 3/23 Vitals:   09/15/16 1309 09/16/16 0450  BP: 137/74 131/76  Pulse: 86 (!) 43  Resp: 18 18  Temp: 97.7 F (36.5 C) 98.7 F (37.1 C)   11. Leucocytosis: Monitor for signs of infection. UA negative. Afebrile,WBC 12.9 12. Polycythemia: Smokes 1.5 PPD. Expect this to remain elevated during hospitalization, On NicoDerm patch 13. COPD: SOB reported--CXR negative. Encourage tobacco cessation.  Managed without medications at this time.   14. Tobacco abuse: Counsel  14. Hypoalbuminemia, start pro-stat 15. Urinary retention,   BPH, trial of Flomax,  Urecholine stopped no ICP for > 1wk 16. 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 CaDonzetta Mattersesterday. Conservative mgt for now---he spoke to son at length. Dr. CaDonzetta Mattersill follow up with patient in office once he's further recovered from CVA 17.  RLL pulm art branch PE, subacute /acute, has IVC filter no desats or SOB, No left upper extremity source of embolism identified LOS (Days) 22 A FACE TO FACE EVALUATION WAS PERFORMED  Eluterio Seymour E 09/16/2016, 6:11 AM

## 2016-09-16 NOTE — Progress Notes (Signed)
Speech Language Pathology Daily Session Note  Patient Details  Name: Chad Combs MRN: [id number] Date of Birth: 1955-07-16  Today's Date: 09/16/2016 SLP Individual Time: 80-1555 SLP Individual Time Calculation (min): 30 min  Short Term Goals: Week 4: SLP Short Term Goal 1 (Week 4): Pt will demonstrate sustained attention in moderately distracting environment for ~30 minutes with Min A verbal cues.  SLP Short Term Goal 2 (Week 4): Pt will complete basic, familiar problem solving tasks with Mod A verbal cues.  SLP Short Term Goal 3 (Week 4): Pt will scan to left of his environment to locate items within basic, familiar tasks with Mod A verbal cues.  SLP Short Term Goal 4 (Week 4): Pt will demonstrate anticipatory awareness by identifying 3 activites that are safe to participate in within home environement with Mod A verbal cues.   Skilled Therapeutic Interventions: Skilled treatment session focused on cognition goals. Pt was in bed, IV being administered and pt stated that he was fatigued from previous session and medical tests. SLP provided tasks that could be completed in bed. SLP further facilitated session by providing Mod A for completion of previously learned problem solving tasks (naming differences between 2 picture cards). Pt required more cues for attention to left picture than previous sessions possibly d/t fatigue. Pt left laying down in bed resting, bed alarm on and all needs within reach. Continue per current plan of care.      Function:  Eating Eating   Modified Consistency Diet: No Eating Assist Level: Set up assist for;Supervision or verbal cues   Eating Set Up Assist For: Opening containers       Cognition Comprehension Comprehension assist level: Follows basic conversation/direction with no assist  Expression   Expression assist level: Expresses basic needs/ideas: With extra time/assistive device  Social Interaction Social Interaction assist level: Interacts  appropriately 90% of the time - Needs monitoring or encouragement for participation or interaction.  Problem Solving Problem solving assist level: Solves basic 50 - 74% of the time/requires cueing 25 - 49% of the time  Memory Memory assist level: Recognizes or recalls 50 - 74% of the time/requires cueing 25 - 49% of the time    Pain    Therapy/Group: Individual Therapy   Chad Combs B. [name], M.S., CCC-SLP Speech-Language Pathologist   Linh Hedberg 09/16/2016, 4:01 PM

## 2016-09-16 NOTE — Progress Notes (Signed)
ANTICOAGULATION CONSULT NOTE   Pharmacy Consult for Heparin Indication: pulmonary embolus and DVT  No Known Allergies  Patient Measurements: Height: 5' 9"  (175.3 cm) Weight: 199 lb 14.4 oz (90.7 kg) IBW/kg (Calculated) : 70.7 Heparin Dosing Weight: 89  Vital Signs: Temp: 98 F (36.7 C) (03/29 1542) Temp Source: Oral (03/29 1542) BP: 106/67 (03/29 1542) Pulse Rate: 90 (03/29 1542)  Labs:  Recent Labs  09/15/16 0541 09/16/16 0216 09/16/16 1102 09/16/16 1621  HGB 15.5 15.6  --   --   HCT 45.5 45.8  --   --   PLT 198 194  --   --   HEPARINUNFRC  --  0.13* 0.48 0.49  CREATININE 0.86  --   --   --     Estimated Creatinine Clearance: 100.4 mL/min (by C-G formula based on SCr of 0.86 mg/dL).   Medical History: Past Medical History:  Diagnosis Date  . Blind right eye   . COPD (chronic obstructive pulmonary disease) ([place])   . Diabetes mellitus without complication ([place])   . Hypertension     Medications:  Scheduled:  . atorvastatin  40 mg Oral q1800  . bethanechol  10 mg Oral TID  . ezetimibe  10 mg Oral q1800  . famotidine  20 mg Oral BID  . feeding supplement (PRO-STAT SUGAR FREE 64)  30 mL Oral BID  . fluticasone  1 spray Each Nare Daily  . gabapentin  100 mg Oral QHS  . insulin aspart  0-20 Units Subcutaneous TID WC & HS  . insulin glargine  45 Units Subcutaneous Daily  . insulin starter kit- pen needles  1 kit Other Once  . lisinopril  20 mg Oral Daily  . metFORMIN  1,000 mg Oral BID WC  . mometasone-formoterol  2 puff Inhalation BID  . nicotine  21 mg Transdermal Daily  . tamsulosin  0.4 mg Oral QPC supper  . tiZANidine  2 mg Oral QHS    Assessment: 61yo male who had been diagnosed with DVT 04/2016 but had run out of his Eliquis and had a stroke on 08/21/16, IVC filter on 08/24/16, then transferred to CIR at [facility] on 08/25/16.  Pt has been receiving ASA 4m and Clopidogrel 773msince 3/8, now found to have PE on CTA 3/26, subacute LLE DVT per dopplers 09/14/16  and small hemorrhagic conversion of stroke on head CT 3/28.  Neuro has seen patient and approved heparin for PE, with low goal and no bolus dosing.  Plan transition to NOAC in 2-3 days if neuro remains stable.  Heparin level 0.4 x2, therapeutic on 1450 units/hr. No changes needed.  Goal of Therapy:  Heparin level 0.3-0.5 units/ml Monitor platelets by anticoagulation protocol: Yes   Plan:  Continue Heparin to 1450 units/hr Daily heparin level, CBC Watch for s/s of bleeding, change in status f/u plan for NOPaysonharmD., BCPS Clinical Pharmacist Pager [phone]/29/2018 5:13 PM

## 2016-09-16 NOTE — Progress Notes (Signed)
ANTICOAGULATION CONSULT NOTE   Pharmacy Consult for Heparin Indication: pulmonary embolus and DVT  No Known Allergies  Patient Measurements: Height: _0  (175.3 cm) Weight: 199 lb 14.4 oz (90.7 kg) IBW/kg (Calculated) : 70.7 Heparin Dosing Weight: 89  Vital Signs: Temp: 98.7 F (37.1 C) (03/29 0450) Temp Source: Oral (03/29 0450) BP: 131/76 (03/29 0450) Pulse Rate: 43 (03/29 0450)  Labs:  Recent Labs  09/15/16 0541 09/16/16 0216 09/16/16 1102  HGB 15.5 15.6  --   HCT 45.5 45.8  --   PLT 198 194  --   HEPARINUNFRC  --  0.13* 0.48  CREATININE 0.86  --   --     Estimated Creatinine Clearance: 100.4 mL/min (by C-G formula based on SCr of 0.86 mg/dL).   Medical History: Past Medical History:  Diagnosis Date  . Blind right eye   . COPD (chronic obstructive pulmonary disease) ([place])   . Diabetes mellitus without complication ([place])   . Hypertension     Medications:  Scheduled:  . atorvastatin  40 mg Oral q1800  . bethanechol  10 mg Oral TID  . ezetimibe  10 mg Oral q1800  . famotidine  20 mg Oral BID  . feeding supplement (PRO-STAT SUGAR FREE 64)  30 mL Oral BID  . fluticasone  1 spray Each Nare Daily  . gabapentin  100 mg Oral QHS  . insulin aspart  0-20 Units Subcutaneous TID WC & HS  . insulin glargine  45 Units Subcutaneous Daily  . insulin starter kit- pen needles  1 kit Other Once  . lisinopril  20 mg Oral Daily  . metFORMIN  1,000 mg Oral BID WC  . mometasone-formoterol  2 puff Inhalation BID  . nicotine  21 mg Transdermal Daily  . tamsulosin  0.4 mg Oral QPC supper  . tiZANidine  2 mg Oral QHS    Assessment: 61yo male who had been diagnosed with DVT 04/2016 but had run out of his Eliquis and had a stroke on 08/21/16, IVC filter on 08/24/16, then transferred to CIR at [facility] on 08/25/16.  Pt has been receiving ASA 63m and Clopidogrel 774msince 3/8, now found to have PE on CTA 3/26, subacute LLE DVT per dopplers 09/14/16 and small hemorrhagic conversion of  stroke on head CT 3/28.  Neuro has seen patient and approved heparin for PE, with low goal and no bolus dosing.  Plan transition to NOAC in 2-3 days if neuro remains stable.  Heparin level 0.48, therapeutic on 1450 units/hr  Goal of Therapy:  Heparin level 0.3-0.5 units/ml Monitor platelets by anticoagulation protocol: Yes   Plan:  Continue Heparin to 1450 units/hr Check confirmatory heparin level at 1700 Daily heparin level, CBC Watch for s/s of bleeding, change in status f/u plan for NOAC  MeMaryanna ShapePharmD, BCPS  Clinical Pharmacist  Pager: [phone] 09/16/2016,2:28 PM

## 2016-09-16 NOTE — Progress Notes (Signed)
Pt complains of nausea this am. Vitals signs checked by sarah CNA... 129/73bp 70 p 16r 97.3 temp. Pt alert and oriented X3, no flank discoloration, Heart rate regular, rt pupil non reactive to light( blind in rt) left reactive, no changes from earlier assessment of pt neuromuscular status. Pt given 5 mg compazine by mouth. Anissa RN came into assess pt status at 0540. At [place] informed of pt status. He is going to come and assess pt. Heparin continued at a rate of 14.5 hr.

## 2016-09-16 NOTE — Progress Notes (Signed)
ANTICOAGULATION CONSULT NOTE   Pharmacy Consult for Heparin Indication: pulmonary embolus and DVT  No Known Allergies  Patient Measurements: Height: 5' 9"  (175.3 cm) Weight: 199 lb 14.4 oz (90.7 kg) IBW/kg (Calculated) : 70.7 Heparin Dosing Weight: 89  Vital Signs:    Labs:  Recent Labs  09/15/16 0541 09/16/16 0216  HGB 15.5 15.6  HCT 45.5 45.8  PLT 198 194  HEPARINUNFRC  --  0.13*  CREATININE 0.86  --     Estimated Creatinine Clearance: 100.4 mL/min (by C-G formula based on SCr of 0.86 mg/dL).   Medical History: Past Medical History:  Diagnosis Date  . Blind right eye   . COPD (chronic obstructive pulmonary disease) ([place])   . Diabetes mellitus without complication ([place])   . Hypertension     Medications:  Scheduled:  . atorvastatin  40 mg Oral q1800  . bethanechol  10 mg Oral TID  . ezetimibe  10 mg Oral q1800  . famotidine  20 mg Oral BID  . feeding supplement (PRO-STAT SUGAR FREE 64)  30 mL Oral BID  . fluticasone  1 spray Each Nare Daily  . gabapentin  100 mg Oral QHS  . insulin aspart  0-20 Units Subcutaneous TID WC & HS  . insulin glargine  45 Units Subcutaneous Daily  . insulin starter kit- pen needles  1 kit Other Once  . lisinopril  20 mg Oral Daily  . metFORMIN  1,000 mg Oral BID WC  . mometasone-formoterol  2 puff Inhalation BID  . nicotine  21 mg Transdermal Daily  . tamsulosin  0.4 mg Oral QPC supper  . 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  Pt has been receiving ASA 18m and Clopidogrel 773msince 3/8, now found to have PE on CTA 3/26, subacute LLE DVT per dopplers 09/14/16 and small hemorrhagic conversion of stroke on head CT 3/28.  Neuro has seen patient and approved heparin for PE, with low goal and no bolus dosing.  Plan transition to NOAC in 2-3 days if neuro  remains stable.  3/29: initial heparin level sub-therapeutic at 0.13, no issues per RN.   Goal of Therapy:  Heparin level 0.3-0.5 units/ml Monitor platelets by anticoagulation protocol: Yes   Plan:  Heparin to 1450 units/hr Check 8 hr heparin level Daily heparin level, CBC Watch for s/s of bleeding, change in status  LeNarda Bonds/29/2018,2:41 AM

## 2016-09-16 NOTE — Progress Notes (Signed)
*  Preliminary Results* Left upper extremity venous duplex completed. Left upper extremity is negative for deep and superficial vein thrombosis.  09/16/2016 11:58 AM  Maudry Mayhew, BS, RVT, RDCS, RDMS

## 2016-09-16 NOTE — Progress Notes (Signed)
Pt is resting no changes from earlier assessment. He stated that nausea is subsiding a little. 121/71 BP, 79 P, 16 R, 95% o2 sat on RA. Pt resting.

## 2016-09-16 NOTE — Progress Notes (Signed)
Social Work Patient ID: Chad Combs, male   DOB: [birth year], 61 y.o.   MRN: [id number]  Met with pt and spoke with son-Will via telephone to discuss team conference progress toward his goals and medical issues. Pam-PA has spoken with him regarding the medical issues and the plan for them. MD feels medically he will need to stay another week before NHP can be pursued. Son feels this is the best option at this time due to The amount of care pt requires. Son would like him to be local if at all possible. Will try to do this especially with the numerous MD follow up appointments he will have. Will work on paperwork for NHP once MD feels he is medically Stable for transfer.

## 2016-09-17 ENCOUNTER — Inpatient Hospital Stay (HOSPITAL_COMMUNITY): Payer: Self-pay

## 2016-09-17 ENCOUNTER — Inpatient Hospital Stay (HOSPITAL_COMMUNITY): Payer: Self-pay | Admitting: Speech Pathology

## 2016-09-17 ENCOUNTER — Inpatient Hospital Stay (HOSPITAL_COMMUNITY): Payer: Self-pay | Admitting: Physical Therapy

## 2016-09-17 LAB — CBC
HEMATOCRIT: 46.9 % (ref 39.0–52.0)
Hemoglobin: 15.8 g/dL (ref 13.0–17.0)
MCH: 32.6 pg (ref 26.0–34.0)
MCHC: 33.7 g/dL (ref 30.0–36.0)
MCV: 96.9 fL (ref 78.0–100.0)
Platelets: 191 10*3/uL (ref 150–400)
RBC: 4.84 MIL/uL (ref 4.22–5.81)
RDW: 12 % (ref 11.5–15.5)
WBC: 9.6 10*3/uL (ref 4.0–10.5)

## 2016-09-17 LAB — GLUCOSE, CAPILLARY
GLUCOSE-CAPILLARY: 129 mg/dL — AB (ref 65–99)
GLUCOSE-CAPILLARY: 92 mg/dL (ref 65–99)
Glucose-Capillary: 137 mg/dL — ABNORMAL HIGH (ref 65–99)
Glucose-Capillary: 159 mg/dL — ABNORMAL HIGH (ref 65–99)

## 2016-09-17 LAB — HEPARIN LEVEL (UNFRACTIONATED): HEPARIN UNFRACTIONATED: 0.52 [IU]/mL (ref 0.30–0.70)

## 2016-09-17 MED ORDER — SENNOSIDES-DOCUSATE SODIUM 8.6-50 MG PO TABS
2.0000 | ORAL_TABLET | Freq: Every day | ORAL | Status: DC
  Administered 2016-09-18 – 2016-09-21 (×4): 2 via ORAL
  Filled 2016-09-17 (×4): qty 2

## 2016-09-17 MED ORDER — RIVAROXABAN 15 MG PO TABS
15.0000 mg | ORAL_TABLET | Freq: Two times a day (BID) | ORAL | Status: DC
  Administered 2016-09-17 – 2016-10-01 (×29): 15 mg via ORAL
  Filled 2016-09-17 (×31): qty 1

## 2016-09-17 MED ORDER — RIVAROXABAN 20 MG PO TABS: 20.0000 mg | ORAL_TABLET | Freq: Every day | ORAL | Status: DC

## 2016-09-17 MED ORDER — POLYETHYLENE GLYCOL 3350 17 G PO PACK
17.0000 g | PACK | Freq: Two times a day (BID) | ORAL | Status: AC
  Administered 2016-09-17 (×2): 17 g via ORAL
  Filled 2016-09-17 (×2): qty 1

## 2016-09-17 NOTE — Progress Notes (Signed)
Occupational Therapy Weekly Progress Note  Patient Details  Name: Chad Combs MRN: [id number] Date of Birth: [birth year]  Beginning of progress report period: September 10, 2016 End of progress report period: September 17, 2016   Patient has met 1 of 4 short term goals.  Pt making slow but steady progress towards OT goals. He has been very limited due to fatigue. Not all short term goals addressed as pt not able to shower this week either because of fatigue or due to being on continuous IV. Pt showing progress with R UE return in gravity eliminated in supine position. He is completing functional transfers with overall mod A with need for VCs for awareness of LE placement and head/hip relationship. Pt now planning to d/c to SNF as son unable to provide needed physical assist at d/c. Cont with POC at this time.   Patient continues to demonstrate the following deficits: abnormal posture, cognitive deficits, disturbance of vision, flaccid hemiplegia and hemiparesis and muscle weakness (generalized) and therefore will continue to benefit from skilled OT intervention to enhance overall performance with BADL and Reduce care partner burden.  Patient progressing toward long term goals..  Continue plan of care.  OT Short Term Goals Week 3:  OT Short Term Goal 1 (Week 3): Pt will consistently complete transfer to toilet with mod A of 1 person A only. OT Short Term Goal 1 - Progress (Week 3): Met OT Short Term Goal 2 (Week 3): Pt will be able to pull pants over hips himself with min A to stabilize balance. OT Short Term Goal 2 - Progress (Week 3): Not met OT Short Term Goal 3 (Week 3): Pt will be able to wash R and L leg arm using a long handled sponge with S. OT Short Term Goal 3 - Progress (Week 3): Progressing toward goal OT Short Term Goal 4 (Week 3): Pt will be able to wt shift to L side safely to wash bottom with lateral lean on tub bench with steadying A. OT Short Term Goal 4 - Progress (Week 3): Other  (comment) (Not addressed) Week 4:  OT Short Term Goal 1 (Week 4): Pt will complete clothing management during LB dressing/ toileting tasks with min steadying assist OT Short Term Goal 2 (Week 4): Pt will complete squat pivot transfers consistently with min A OT Short Term Goal 3 (Week 4): Pt will complete 1 grooming task in standing with steadying assist  Therapy Documentation Precautions:  Precautions Precautions: Fall Precaution Comments: L sided neglect, shoulder increased risk for subluxation; quick LOB L wihtout awareness Restrictions Weight Bearing Restrictions: No  See Function Navigator for Current Functional Status.   Lewis, Shawny Borkowski C 09/17/2016, 7:13 AM

## 2016-09-17 NOTE — Progress Notes (Addendum)
ANTICOAGULATION CONSULT NOTE - Follow Up Consult  Pharmacy Consult for heparin Indication: PE and DVT  No Known Allergies  Patient Measurements: Height: 5' 9"  (175.3 cm) Weight: 199 lb 14.4 oz (90.7 kg) IBW/kg (Calculated) : 70.7 Heparin Dosing Weight:   Vital Signs: Temp: 97.9 F (36.6 C) (03/30 0542) Temp Source: Oral (03/30 0542) BP: 112/68 (03/30 0542) Pulse Rate: 83 (03/30 0542)  Labs:  Recent Labs  09/15/16 0541  09/16/16 0216 09/16/16 1102 09/16/16 1621 09/17/16 0622  HGB 15.5  --  15.6  --   --  15.8  HCT 45.5  --  45.8  --   --  46.9  PLT 198  --  194  --   --  191  HEPARINUNFRC  --   < > 0.13* 0.48 0.49 0.52  CREATININE 0.86  --   --   --   --   --   < > = values in this interval not displayed.  Estimated Creatinine Clearance: 100.4 mL/min (by C-G formula based on SCr of 0.86 mg/dL).   Medications:  Scheduled:  . atorvastatin  40 mg Oral q1800  . bethanechol  10 mg Oral TID  . ezetimibe  10 mg Oral q1800  . famotidine  20 mg Oral BID  . feeding supplement (PRO-STAT SUGAR FREE 64)  30 mL Oral BID  . fluticasone  1 spray Each Nare Daily  . gabapentin  100 mg Oral QHS  . insulin aspart  0-20 Units Subcutaneous TID WC & HS  . insulin glargine  45 Units Subcutaneous Daily  . insulin starter kit- pen needles  1 kit Other Once  . lisinopril  20 mg Oral Daily  . metFORMIN  1,000 mg Oral BID WC  . mometasone-formoterol  2 puff Inhalation BID  . nicotine  21 mg Transdermal Daily  . tamsulosin  0.4 mg Oral QPC supper  . tiZANidine  2 mg Oral QHS   Infusions:  . heparin 1,450 Units/hr (09/17/16 0756)    Assessment: 61 yo male with PE and DVT is currently on slightly supratherapeutic heparin.  Heparin level today is 0.52.   Goal of Therapy:  Heparin level 0.3-0.5 units/ml Monitor platelets by anticoagulation protocol: Yes   Plan:  - empirically reduce heparin to 1400 units/hr - daily heparin level and CBC - f/u plan for DOAC  Chad Combs,  Tsz-Yin 09/17/2016,8:33 AM   Addendum: Switching heparin to Xarelto.    Plan - d/c heparin at 1059 today and start xarelto 15 mg po bid x 3 weeks, then 20 mg po daily starting on 10/08/16 - monitor bleeding and CBC - d/c daily heparin level

## 2016-09-17 NOTE — Progress Notes (Signed)
Occupational Therapy Session Note  Patient Details  Name: Chad Combs MRN: [id number] Date of Birth: September 08, 1955  Today's Date: 09/17/2016 OT Individual Time: 3662-9476 and 1510-1557 OT Individual Time Calculation (min): 75 min and 47 min   Short Term Goals: Week 3:  OT Short Term Goal 1 (Week 3): Pt will consistently complete transfer to toilet with mod A of 1 person A only. OT Short Term Goal 2 (Week 3): Pt will be able to pull pants over hips himself with min A to stabilize balance. OT Short Term Goal 3 (Week 3): Pt will be able to wash R and L leg arm using a long handled sponge with S. OT Short Term Goal 4 (Week 3): Pt will be able to wt shift to L side safely to wash bottom with lateral lean on tub bench with steadying A.  Skilled Therapeutic Interventions/Progress Updates:    1:1. No complaint of pain. Focus of session on ADL retraining, weight bearing through RUE and standing balance/endurance. At EOB pt washes UB with HOH A of LUE to wash RUE with wash cloth and MIN-MOD A for sitting balance. Pt reports urgency, and completes toilet transfer with TOTAL A in steady to [facility] over toilet to void bladder. Pt able to sit to stand in steady with VC for safety awareness and foot placement to wash buttocks and peri area with MIN A for standing balance. Pt transfers in steady to w/c, and threads BLE into pants with VC to assume seated figure 4 and A to hold leg in figure 4 to thread LLE into pants/don socks. Pt STS at sink with VC for safety awareness and weight shifting with MIN A for standing balance as OT advances pants past hips. Pt brushes teeth in standing while weight bearing through LUE with MIN A for standing balance, manual facilitation of weight shifting and VVC for posture. Pt requires prolonged seated rest breaks d/t fatigue after standing. In day room, pt completes sit to stand at table as stated above and weight bears LUE in shoe placed on table to take out shoe strings with RUE. OT  replaces with elastic shoe strings d/t decreased [place] in LUE. Pt dons B shoes with A for L shoe threaded over toes. Exited session with pt seated in w/c in room with call light in reach and QRB donned.   Session 2: Focus on standing balance, toileting, transfer training and LUE strengthening. Pt seen with direct handoff from nursing in bathroom. Pt required increased time to void bowel. Pt sit to stand in steady with A for standing balance to complete posterior hygiene with VC for weight shifting and foot placement. Pt stands in steady with VC for weight shifting to wash hands with HOH A of LUE to wash. Pt transfers to w/c in steady with total A. Pt squat pivot transfer with MIN A for balance, VC for sequencing and LLE placement w/c<>EOM and w/c>EOB. Pt able to recall head hip relationship. While on mat pt assumes sidelying and supine to achieve gravity eliminated positions for shoulder flexion/extension, elbow flexion/extension, and shoulder ab/adduction. 1x10 reps each with A from OT to reach end ranges. Pt only able to achieve mod ranges of shoulder abduction d/t pain. Exited session with pt supine in bed with bed exit alarm on and call light in reach.   Therapy Documentation Precautions:  Precautions Precautions: Fall Precaution Comments: L sided neglect, shoulder increased risk for subluxation; quick LOB L wihtout awareness Restrictions Weight Bearing Restrictions: No General:  Vital Signs:   Pain: Pain Assessment Pain Assessment: No/denies pain Pain Score: 0-No pain ADL:   Exercises:   Other Treatments:    See Function Navigator for Current Functional Status.   Therapy/Group: Individual Therapy  Tonny Branch 09/17/2016, 10:41 AM

## 2016-09-17 NOTE — Progress Notes (Signed)
Subjective/Complaints:  No issues overnite, sleeping but awakens easily  ROS: pt denies nausea, vomiting, diarrhea, cough, shortness of breath or chest pain    Objective: Vital Signs: Blood pressure 112/68, pulse 83, temperature 97.9 F (36.6 C), temperature source Oral, resp. rate 18, height _0  (1.753 m), weight 90.7 kg (199 lb 14.4 oz), SpO2 96 %. No results found. Results for orders placed or performed during the hospital encounter of 08/25/16 (from the past 72 hour(s))  Glucose, capillary     Status: Abnormal   Collection Time: 09/14/16 11:37 AM  Result Value Ref Range   Glucose-Capillary 165 (H) 65 - 99 mg/dL  Glucose, capillary     Status: None   Collection Time: 09/14/16  3:57 PM  Result Value Ref Range   Glucose-Capillary 92 65 - 99 mg/dL  Glucose, capillary     Status: None   Collection Time: 09/14/16  9:34 PM  Result Value Ref Range   Glucose-Capillary 96 65 - 99 mg/dL  Basic metabolic panel     Status: None   Collection Time: 09/15/16  5:41 AM  Result Value Ref Range   Sodium 137 135 - 145 mmol/L   Potassium 3.5 3.5 - 5.1 mmol/L   Chloride 104 101 - 111 mmol/L   CO2 24 22 - 32 mmol/L   Glucose, Bld 86 65 - 99 mg/dL   BUN 16 6 - 20 mg/dL   Creatinine, Ser 0.86 0.61 - 1.24 mg/dL   Calcium 9.1 8.9 - 10.3 mg/dL   GFR calc non Af Amer >60 >60 mL/min   GFR calc Af Amer >60 >60 mL/min    Comment: (NOTE) The eGFR has been calculated using the CKD EPI equation. This calculation has not been validated in all clinical situations. eGFR's persistently <60 mL/min signify possible Chronic Kidney Disease.    Anion gap 9 5 - 15  CBC     Status: Abnormal   Collection Time: 09/15/16  5:41 AM  Result Value Ref Range   WBC 11.6 (H) 4.0 - 10.5 K/uL   RBC 4.68 4.22 - 5.81 MIL/uL   Hemoglobin 15.5 13.0 - 17.0 g/dL   HCT 45.5 39.0 - 52.0 %   MCV 97.2 78.0 - 100.0 fL   MCH 33.1 26.0 - 34.0 pg   MCHC 34.1 30.0 - 36.0 g/dL   RDW 12.0 11.5 - 15.5 %   Platelets 198 150 - 400  K/uL  Glucose, capillary     Status: None   Collection Time: 09/15/16  6:37 AM  Result Value Ref Range   Glucose-Capillary 87 65 - 99 mg/dL  Glucose, capillary     Status: Abnormal   Collection Time: 09/15/16 12:02 PM  Result Value Ref Range   Glucose-Capillary 152 (H) 65 - 99 mg/dL   Comment 1 Notify RN   Glucose, capillary     Status: Abnormal   Collection Time: 09/15/16  4:22 PM  Result Value Ref Range   Glucose-Capillary 101 (H) 65 - 99 mg/dL   Comment 1 Notify RN   Glucose, capillary     Status: Abnormal   Collection Time: 09/15/16  9:04 PM  Result Value Ref Range   Glucose-Capillary 111 (H) 65 - 99 mg/dL  Heparin level (unfractionated)     Status: Abnormal   Collection Time: 09/16/16  2:16 AM  Result Value Ref Range   Heparin Unfractionated 0.13 (L) 0.30 - 0.70 IU/mL    Comment:        IF HEPARIN RESULTS ARE  BELOW EXPECTED VALUES, AND PATIENT DOSAGE HAS BEEN CONFIRMED, SUGGEST FOLLOW UP TESTING OF ANTITHROMBIN III LEVELS.   CBC     Status: Abnormal   Collection Time: 09/16/16  2:16 AM  Result Value Ref Range   WBC 11.9 (H) 4.0 - 10.5 K/uL   RBC 4.72 4.22 - 5.81 MIL/uL   Hemoglobin 15.6 13.0 - 17.0 g/dL   HCT 45.8 39.0 - 52.0 %   MCV 97.0 78.0 - 100.0 fL   MCH 33.1 26.0 - 34.0 pg   MCHC 34.1 30.0 - 36.0 g/dL   RDW 11.9 11.5 - 15.5 %   Platelets 194 150 - 400 K/uL  Glucose, capillary     Status: None   Collection Time: 09/16/16  6:29 AM  Result Value Ref Range   Glucose-Capillary 95 65 - 99 mg/dL  Heparin level (unfractionated)     Status: None   Collection Time: 09/16/16 11:02 AM  Result Value Ref Range   Heparin Unfractionated 0.48 0.30 - 0.70 IU/mL    Comment:        IF HEPARIN RESULTS ARE BELOW EXPECTED VALUES, AND PATIENT DOSAGE HAS BEEN CONFIRMED, SUGGEST FOLLOW UP TESTING OF ANTITHROMBIN III LEVELS.   Glucose, capillary     Status: Abnormal   Collection Time: 09/16/16 12:07 PM  Result Value Ref Range   Glucose-Capillary 126 (H) 65 - 99 mg/dL   Heparin level (unfractionated)     Status: None   Collection Time: 09/16/16  4:21 PM  Result Value Ref Range   Heparin Unfractionated 0.49 0.30 - 0.70 IU/mL    Comment:        IF HEPARIN RESULTS ARE BELOW EXPECTED VALUES, AND PATIENT DOSAGE HAS BEEN CONFIRMED, SUGGEST FOLLOW UP TESTING OF ANTITHROMBIN III LEVELS.   Glucose, capillary     Status: Abnormal   Collection Time: 09/16/16  4:38 PM  Result Value Ref Range   Glucose-Capillary 127 (H) 65 - 99 mg/dL  Glucose, capillary     Status: Abnormal   Collection Time: 09/16/16  9:10 PM  Result Value Ref Range   Glucose-Capillary 40 (LL) 65 - 99 mg/dL  Glucose, capillary     Status: Abnormal   Collection Time: 09/16/16  9:20 PM  Result Value Ref Range   Glucose-Capillary 115 (H) 65 - 99 mg/dL  CBC     Status: None   Collection Time: 09/17/16  6:22 AM  Result Value Ref Range   WBC 9.6 4.0 - 10.5 K/uL   RBC 4.84 4.22 - 5.81 MIL/uL   Hemoglobin 15.8 13.0 - 17.0 g/dL   HCT 46.9 39.0 - 52.0 %   MCV 96.9 78.0 - 100.0 fL   MCH 32.6 26.0 - 34.0 pg   MCHC 33.7 30.0 - 36.0 g/dL   RDW 12.0 11.5 - 15.5 %   Platelets 191 150 - 400 K/uL  Glucose, capillary     Status: Abnormal   Collection Time: 09/17/16  6:43 AM  Result Value Ref Range   Glucose-Capillary 137 (H) 65 - 99 mg/dL     HEENT: normal Cardio: RRR Resp: CTA bilaterally GI: BS + Extremity:  No edema Skin:   Intact Neuro:  Flat, Abnormal Sensory Abnormal Motor Motor is 5/5 in the right deltoid, biceps, triceps, grip, hip flexor, knee extensor, ankle dorsiflexor, 2- left biceps and finger flexors, 2 minus, left hip, knee extensor synergy, 0 at the ankle, Abnormal FMC Ataxic/ dec FMC and Dysarthric Musc/Skel:  Other No pain with upper extremity or lower extremity range  of motion Gen. no acute distress   Assessment/Plan: 1. Functional deficits secondary to Right MCA infarct with Left Hemiparesis which require 3+ hours per day of interdisciplinary therapy in a comprehensive  inpatient rehab setting. Physiatrist is providing close team supervision and 24 hour management of active medical problems listed below. Physiatrist and rehab team continue to assess barriers to discharge/monitor patient progress toward functional and medical goals. FIM: Function - Bathing Position: Sitting EOB Body parts bathed by patient: Left arm, Chest, Abdomen, Front perineal area, Right upper leg, Left upper leg Body parts bathed by helper: Right arm, Buttocks, Back, Right lower leg, Left lower leg Assist Level: Touching or steadying assistance(Pt > 75%)  Function- Upper Body Dressing/Undressing What is the patient wearing?: Button up shirt Pull over shirt/dress - Perfomed by patient: Thread/unthread right sleeve, Put head through opening, Pull shirt over trunk Pull over shirt/dress - Perfomed by helper: Thread/unthread left sleeve Button up shirt - Perfomed by patient: Thread/unthread right sleeve, Thread/unthread left sleeve Button up shirt - Perfomed by helper: Pull shirt around back, Button/unbutton shirt Assist Level: Touching or steadying assistance(Pt > 75%) Function - Lower Body Dressing/Undressing Lower body dressing/undressing activity did not occur:  (did not occur with this clinician today) What is the patient wearing?: Non-skid slipper socks, Pants Position: Sitting EOB Underwear - Performed by helper: Thread/unthread right underwear leg, Thread/unthread left underwear leg, Pull underwear up/down Pants- Performed by patient: Thread/unthread right pants leg Pants- Performed by helper: Thread/unthread left pants leg, Pull pants up/down Non-skid slipper socks- Performed by patient: Don/doff right sock Non-skid slipper socks- Performed by helper: Don/doff right sock, Don/doff left sock Socks - Performed by patient: Don/doff left sock Shoes - Performed by patient: Don/doff right shoe Shoes - Performed by helper: Don/doff right shoe, Don/doff left shoe, Fasten right, Fasten  left AFO - Performed by helper: Don/doff left AFO Assist for footwear: Maximal assist Assist for lower body dressing: 2 Helpers  Function - Toileting Toileting activity did not occur: No continent bowel/bladder event Toileting steps completed by patient: Adjust clothing prior to toileting Toileting steps completed by helper: Adjust clothing prior to toileting, Performs perineal hygiene, Adjust clothing after toileting Toileting Assistive Devices: Grab bar or rail Assist level: Touching or steadying assistance (Pt.75%)  Function - Air cabin crew transfer activity did not occur:  (using urinal or incont., no BM) Toilet transfer assistive device: Elevated toilet seat/BSC over toilet, Grab bar Mechanical lift: Stedy Assist level to toilet: Touching or steadying assistance (Pt > 75%) Assist level from toilet: Touching or steadying assistance (Pt > 75%)  Function - Chair/bed transfer Chair/bed transfer method: Squat pivot Chair/bed transfer assist level: Touching or steadying assistance (Pt > 75%) Chair/bed transfer assistive device: Armrests Mechanical lift: Stedy Chair/bed transfer details: Manual facilitation for weight shifting, Verbal cues for precautions/safety, Verbal cues for technique, Manual facilitation for placement  Function - Locomotion: Wheelchair Will patient use wheelchair at discharge?: Yes Type: Manual Max wheelchair distance: 161f  Assist Level: Supervision or verbal cues Assist Level: Supervision or verbal cues Assist Level: Supervision or verbal cues Turns around,maneuvers to table,bed, and toilet,negotiates 3% grade,maneuvers on rugs and over doorsills: No Function - Locomotion: Ambulation Ambulation activity did not occur: Safety/medical concerns (pt pushes toward hemi side; unsafe) Assistive device: Rail in hallway, Orthosis Max distance: 10 Assist level: 2 helpers Walk 10 feet activity did not occur: Safety/medical concerns Assist level: 2  helpers Walk 50 feet with 2 turns activity did not occur: Safety/medical concerns Walk 150 feet activity  did not occur: Safety/medical concerns Walk 10 feet on uneven surfaces activity did not occur: Safety/medical concerns  Function - Comprehension Comprehension: Auditory Comprehension assist level: Follows basic conversation/direction with no assist  Function - Expression Expression: Verbal Expression assist level: Expresses basic needs/ideas: With extra time/assistive device  Function - Social Interaction Social Interaction assist level: Interacts appropriately 90% of the time - Needs monitoring or encouragement for participation or interaction.  Function - Problem Solving Problem solving assist level: Solves basic 50 - 74% of the time/requires cueing 25 - 49% of the time  Function - Memory Memory assist level: Recognizes or recalls 50 - 74% of the time/requires cueing 25 - 49% of the time Patient normally able to recall (first 3 days only): Current season, Location of own room, Staff names and faces, That he or she is in a hospital  Medical Problem List and Plan: 1.  Left hemiplegia, limitations with self-care, difficulty with transfers secondary to right MCA CVA.   Cont CIR PT, OT, speech therapies.  2.  DVT Prophylaxis/Anticoagulation: Pharmaceutical: IV heparin for PE, Hgb normal 3. Pain Management: Left hip/groin pain may be spasms , plus minus. Osteoarthritis. Check x-ray, Zanaflex at night  4. Mood: Team to provide ego support. Expressing anxiety about current situation. Has supportive family. LCSW to follow for evaluation and support.  5. Neuropsych: This patient is capable of making decisions on his own behalf. 6. Skin/Wound Care: routine pressure relief measures.  7. Fluids/Electrolytes/Nutrition: Monitor I/O. Check lytes in am. Encourage fluid intake.  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 9. T2DM: Hgb  A1C- 10.5 --. Continue metformin 105m bid. Was started on lantus  45 units daily at bedtime, with SSI for elevated BS--will consult RD for diabetic education and start education on insulin administration.  Will monitor BS ac/hs,at goal 3/28 10.  Acute/subacute right lower lobe pulmonary embolism. IV heparin per neuro and transition to NOAC today if pt remain neurologically stable CBG (last 3)   Recent Labs  09/16/16 2110 09/16/16 2120 09/17/16 0643  GLUCAP 40* 115* 137*    10. HTN: Monitor BP bid--on lisinopril 222mdaily.stabilizing in normal range 3/23 Vitals:   09/16/16 1542 09/17/16 0542  BP: 106/67 112/68  Pulse: 90 83  Resp: 16 18  Temp: 98 F (36.7 C) 97.9 F (36.6 C)   11. Leucocytosis: Monitor for signs of infection. UA negative. Afebrile,WBC 12.9 12. Polycythemia: Smokes 1.5 PPD. Expect this to remain elevated during hospitalization, On NicoDerm patch 13. COPD: SOB reported--CXR negative. Encourage tobacco cessation.  Managed without medications at this time.   14. Tobacco abuse: Counsel  14. Hypoalbuminemia, start pro-stat 15. Urinary retention,   BPH, trial of Flomax,  Urecholine stopped no ICP for > 1wk 16. 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 CaDonzetta Mattersesterday. Conservative mgt for now---he spoke to son at length. Dr. CaDonzetta Mattersill follow up with patient in office once he's further recovered from CVA 17.  RLL pulm art branch PE, subacute /acute, IV Heparin , has IVC filter no desats or SOB, No left upper extremity source of embolism identified LOS (Days) 23 A FACE TO FACE EVALUATION WAS PERFORMED  Adryel Wortmann E 09/17/2016, 7:02 AM

## 2016-09-17 NOTE — Progress Notes (Signed)
Physical Therapy Session Note  Patient Details  Name: Chad Combs MRN: [id number] Date of Birth: 1956/02/02  Today's Date: 09/17/2016 PT Individual Time: 1415-1515 PT Individual Time Calculation (min): 60 min   Short Term Goals: Week 4:  PT Short Term Goal 1 (Week 4): STG =LTG due to ELSO  Skilled Therapeutic Interventions/Progress Updates:   Pt received sitting on Toilet and agreeable to PT. Toilet transfer with min assist using rail in BR with PT required to perform perineal hygiene and clothing management. .   WC mobility in all x 191ft with supervision assist from PT for safety.  Gait with orthotist present x 38ft at rail in hall. Max assist for LLE limb advancement. Pt noted to have mild quad control this day. But predominately stabilizing LLE through hip extension. Orthotist recommends stable GRF Blue rocker AFO to improve knee control and toe cap to prevent friction with foot drag.   Nustep x 6 minutes +4 minutes with LLE hip adduction support. Level 1 BLE only. Improved LLE nueromotor control with increased time throughout nustep training. Reports Borg RPE 15/20 following nustep.   Transfers with min assist to L and R on and off nustep   Toilet transfer with min assist a tend to session for additional bowel movement. Left on toilet, RN awanre. .      Therapy Documentation Precautions:  Precautions Precautions: Fall Precaution Comments: L sided neglect, shoulder increased risk for subluxation; quick LOB L wihtout awareness Restrictions Weight Bearing Restrictions: No    Vital Signs: Therapy Vitals Temp: 97.6 F (36.4 C) Temp Source: Oral Pulse Rate: 97 Resp: 18 BP: 101/60 Patient Position (if appropriate): Lying Oxygen Therapy SpO2: 93 % O2 Device: Not Delivered Pain: 3/10 R thigh.   See Function Navigator for Current Functional Status.   Therapy/Group: Individual Therapy  [name] 09/17/2016, 5:20 PM

## 2016-09-17 NOTE — Progress Notes (Signed)
Speech Language Pathology Daily Session Note  Patient Details  Name: Chad Combs MRN: [id number] Date of Birth: 11-Feb-1956  Today's Date: 09/17/2016 SLP Individual Time: 1330-1400 SLP Individual Time Calculation (min): 30 min  Short Term Goals: Week 4: SLP Short Term Goal 1 (Week 4): Pt will demonstrate sustained attention in moderately distracting environment for ~30 minutes with Min A verbal cues.  SLP Short Term Goal 2 (Week 4): Pt will complete basic, familiar problem solving tasks with Mod A verbal cues.  SLP Short Term Goal 3 (Week 4): Pt will scan to left of his environment to locate items within basic, familiar tasks with Mod A verbal cues.  SLP Short Term Goal 4 (Week 4): Pt will demonstrate anticipatory awareness by identifying 3 activites that are safe to participate in within home environement with Mod A verbal cues.   Skilled Therapeutic Interventions: Skilled treatment session focused on cognition goals. SLP facilitated session by providing Mod A verbal cues for sustained attention in mildly distracting environment d/t pt's internal distraction of decreased bowel movements. Pt able to complete basic, familiar problem solving tasks with Min A verbal cues for game "Call It." Pt was returned to room, left upright in wheelchair, safety belt donned and all needs within reach. Continue per current plan of care.      Function:  Eating Eating   Modified Consistency Diet: No Eating Assist Level: Set up assist for;Supervision or verbal cues   Eating Set Up Assist For: Opening containers       Cognition Comprehension Comprehension assist level: Follows basic conversation/direction with no assist  Expression   Expression assist level: Expresses basic needs/ideas: With extra time/assistive device  Social Interaction Social Interaction assist level: Interacts appropriately 90% of the time - Needs monitoring or encouragement for participation or interaction.  Problem Solving Problem  solving assist level: Solves basic 50 - 74% of the time/requires cueing 25 - 49% of the time  Memory Memory assist level: Recognizes or recalls 50 - 74% of the time/requires cueing 25 - 49% of the time    Pain    Therapy/Group: Individual Therapy  Lady Wisham 09/17/2016, 1:49 PM

## 2016-09-18 ENCOUNTER — Inpatient Hospital Stay (HOSPITAL_COMMUNITY): Payer: Self-pay

## 2016-09-18 ENCOUNTER — Inpatient Hospital Stay (HOSPITAL_COMMUNITY): Payer: Self-pay | Admitting: Speech Pathology

## 2016-09-18 LAB — GLUCOSE, CAPILLARY
GLUCOSE-CAPILLARY: 126 mg/dL — AB (ref 65–99)
Glucose-Capillary: 93 mg/dL (ref 65–99)
Glucose-Capillary: 140 mg/dL — ABNORMAL HIGH (ref 65–99)
Glucose-Capillary: 87 mg/dL (ref 65–99)

## 2016-09-18 MED ORDER — FAMOTIDINE 40 MG PO TABS
40.0000 mg | ORAL_TABLET | Freq: Two times a day (BID) | ORAL | Status: DC
  Administered 2016-09-18 – 2016-10-01 (×26): 40 mg via ORAL
  Filled 2016-09-18 (×27): qty 1

## 2016-09-18 NOTE — Progress Notes (Signed)
Subjective/Complaints:  Son at bedside, reviewed recent diagnoses of pulmonary embolism, abdominal aortic aneurysm times 2  ROS: pt denies nausea, vomiting, diarrhea, cough, shortness of breath or chest pain    Objective: Vital Signs: Blood pressure 135/81, pulse 74, temperature 97.9 F (36.6 C), temperature source Oral, resp. rate 17, height 5\' 9"  (1.753 m), weight 90.7 kg (199 lb 14.4 oz), SpO2 95 %. No results found. Results for orders placed or performed during the hospital encounter of 08/25/16 (from the past 72 hour(s))  Glucose, capillary     Status: Abnormal   Collection Time: 09/15/16 12:02 PM  Result Value Ref Range   Glucose-Capillary 152 (H) 65 - 99 mg/dL   Comment 1 Notify RN   Glucose, capillary     Status: Abnormal   Collection Time: 09/15/16  4:22 PM  Result Value Ref Range   Glucose-Capillary 101 (H) 65 - 99 mg/dL   Comment 1 Notify RN   Glucose, capillary     Status: Abnormal   Collection Time: 09/15/16  9:04 PM  Result Value Ref Range   Glucose-Capillary 111 (H) 65 - 99 mg/dL  Heparin level (unfractionated)     Status: Abnormal   Collection Time: 09/16/16  2:16 AM  Result Value Ref Range   Heparin Unfractionated 0.13 (L) 0.30 - 0.70 IU/mL    Comment:        IF HEPARIN RESULTS ARE BELOW EXPECTED VALUES, AND PATIENT DOSAGE HAS BEEN CONFIRMED, SUGGEST FOLLOW UP TESTING OF ANTITHROMBIN III LEVELS.   CBC     Status: Abnormal   Collection Time: 09/16/16  2:16 AM  Result Value Ref Range   WBC 11.9 (H) 4.0 - 10.5 K/uL   RBC 4.72 4.22 - 5.81 MIL/uL   Hemoglobin 15.6 13.0 - 17.0 g/dL   HCT 45.8 39.0 - 52.0 %   MCV 97.0 78.0 - 100.0 fL   MCH 33.1 26.0 - 34.0 pg   MCHC 34.1 30.0 - 36.0 g/dL   RDW 11.9 11.5 - 15.5 %   Platelets 194 150 - 400 K/uL  Glucose, capillary     Status: None   Collection Time: 09/16/16  6:29 AM  Result Value Ref Range   Glucose-Capillary 95 65 - 99 mg/dL  Heparin level (unfractionated)     Status: None   Collection Time: 09/16/16  11:02 AM  Result Value Ref Range   Heparin Unfractionated 0.48 0.30 - 0.70 IU/mL    Comment:        IF HEPARIN RESULTS ARE BELOW EXPECTED VALUES, AND PATIENT DOSAGE HAS BEEN CONFIRMED, SUGGEST FOLLOW UP TESTING OF ANTITHROMBIN III LEVELS.   Glucose, capillary     Status: Abnormal   Collection Time: 09/16/16 12:07 PM  Result Value Ref Range   Glucose-Capillary 126 (H) 65 - 99 mg/dL  Heparin level (unfractionated)     Status: None   Collection Time: 09/16/16  4:21 PM  Result Value Ref Range   Heparin Unfractionated 0.49 0.30 - 0.70 IU/mL    Comment:        IF HEPARIN RESULTS ARE BELOW EXPECTED VALUES, AND PATIENT DOSAGE HAS BEEN CONFIRMED, SUGGEST FOLLOW UP TESTING OF ANTITHROMBIN III LEVELS.   Glucose, capillary     Status: Abnormal   Collection Time: 09/16/16  4:38 PM  Result Value Ref Range   Glucose-Capillary 127 (H) 65 - 99 mg/dL  Glucose, capillary     Status: Abnormal   Collection Time: 09/16/16  9:10 PM  Result Value Ref Range   Glucose-Capillary 40 (  LL) 65 - 99 mg/dL  Glucose, capillary     Status: Abnormal   Collection Time: 09/16/16  9:20 PM  Result Value Ref Range   Glucose-Capillary 115 (H) 65 - 99 mg/dL  Heparin level (unfractionated)     Status: None   Collection Time: 09/17/16  6:22 AM  Result Value Ref Range   Heparin Unfractionated 0.52 0.30 - 0.70 IU/mL    Comment:        IF HEPARIN RESULTS ARE BELOW EXPECTED VALUES, AND PATIENT DOSAGE HAS BEEN CONFIRMED, SUGGEST FOLLOW UP TESTING OF ANTITHROMBIN III LEVELS.   CBC     Status: None   Collection Time: 09/17/16  6:22 AM  Result Value Ref Range   WBC 9.6 4.0 - 10.5 K/uL   RBC 4.84 4.22 - 5.81 MIL/uL   Hemoglobin 15.8 13.0 - 17.0 g/dL   HCT 46.9 39.0 - 52.0 %   MCV 96.9 78.0 - 100.0 fL   MCH 32.6 26.0 - 34.0 pg   MCHC 33.7 30.0 - 36.0 g/dL   RDW 12.0 11.5 - 15.5 %   Platelets 191 150 - 400 K/uL  Glucose, capillary     Status: Abnormal   Collection Time: 09/17/16  6:43 AM  Result Value Ref  Range   Glucose-Capillary 137 (H) 65 - 99 mg/dL  Glucose, capillary     Status: Abnormal   Collection Time: 09/17/16 12:22 PM  Result Value Ref Range   Glucose-Capillary 129 (H) 65 - 99 mg/dL  Glucose, capillary     Status: Abnormal   Collection Time: 09/17/16  5:05 PM  Result Value Ref Range   Glucose-Capillary 159 (H) 65 - 99 mg/dL  Glucose, capillary     Status: None   Collection Time: 09/17/16  9:06 PM  Result Value Ref Range   Glucose-Capillary 92 65 - 99 mg/dL  Glucose, capillary     Status: None   Collection Time: 09/18/16  6:42 AM  Result Value Ref Range   Glucose-Capillary 93 65 - 99 mg/dL     HEENT: normal Cardio: RRR Resp: CTA bilaterally GI: BS + Extremity:  No edema Skin:   Intact Neuro:  Flat, Abnormal Sensory Abnormal Motor Motor is 5/5 in the right deltoid, biceps, triceps, grip, hip flexor, knee extensor, ankle dorsiflexor, 2- left biceps and finger flexors, 2 minus, left hip, knee extensor synergy, 0 at the ankle, Abnormal FMC Ataxic/ dec FMC and Dysarthric Musc/Skel:  Other No pain with upper extremity or lower extremity range of motion Gen. no acute distress   Assessment/Plan: 1. Functional deficits secondary to Right MCA infarct with Left Hemiparesis which require 3+ hours per day of interdisciplinary therapy in a comprehensive inpatient rehab setting. Physiatrist is providing close team supervision and 24 hour management of active medical problems listed below. Physiatrist and rehab team continue to assess barriers to discharge/monitor patient progress toward functional and medical goals. FIM: Function - Bathing Position: Sitting EOB Body parts bathed by patient: Left arm, Chest, Abdomen, Front perineal area, Right upper leg, Left upper leg, Right lower leg, Left lower leg Body parts bathed by helper: Right arm, Back, Buttocks Assist Level: Touching or steadying assistance(Pt > 75%)  Function- Upper Body Dressing/Undressing What is the patient  wearing?: Button up shirt Pull over shirt/dress - Perfomed by patient: Thread/unthread right sleeve, Put head through opening, Pull shirt over trunk Pull over shirt/dress - Perfomed by helper: Thread/unthread left sleeve Button up shirt - Perfomed by patient: Thread/unthread right sleeve, Thread/unthread left sleeve Button  up shirt - Perfomed by helper: Pull shirt around back, Button/unbutton shirt Assist Level: Touching or steadying assistance(Pt > 75%) Function - Lower Body Dressing/Undressing Lower body dressing/undressing activity did not occur:  (did not occur with this clinician today) What is the patient wearing?: Pants, Non-skid slipper socks, Shoes Position: Wheelchair/chair at sink Underwear - Performed by helper: Thread/unthread right underwear leg, Thread/unthread left underwear leg, Pull underwear up/down Pants- Performed by patient: Thread/unthread right pants leg, Thread/unthread left pants leg Pants- Performed by helper: Pull pants up/down Non-skid slipper socks- Performed by patient: Don/doff right sock, Don/doff left sock Non-skid slipper socks- Performed by helper:  (A to stabilize leg in figure 4) Socks - Performed by patient: Don/doff left sock Shoes - Performed by patient: Don/doff right shoe (elastic shoe laces) Shoes - Performed by helper: Don/doff left shoe AFO - Performed by patient: Don/doff left AFO AFO - Performed by helper: Don/doff left AFO Assist for footwear: Maximal assist Assist for lower body dressing: 2 Helpers  Function - Toileting Toileting activity did not occur: No continent bowel/bladder event Toileting steps completed by patient: Performs perineal hygiene Toileting steps completed by helper: Adjust clothing prior to toileting Toileting Assistive Devices: Grab bar or rail Assist level:  (MAX A)  Function - Air cabin crew transfer activity did not occur:  (using urinal or incont., no BM) Toilet transfer assistive device: Elevated  toilet seat/BSC over toilet, Grab bar Mechanical lift: Stedy Assist level to toilet: Touching or steadying assistance (Pt > 75%) Assist level from toilet: Touching or steadying assistance (Pt > 75%)  Function - Chair/bed transfer Chair/bed transfer method: Squat pivot Chair/bed transfer assist level: Touching or steadying assistance (Pt > 75%) Chair/bed transfer assistive device: Armrests Mechanical lift: Stedy Chair/bed transfer details: Manual facilitation for weight shifting, Verbal cues for precautions/safety, Verbal cues for technique, Manual facilitation for placement  Function - Locomotion: Wheelchair Will patient use wheelchair at discharge?: Yes Type: Manual Max wheelchair distance: 135ft  Assist Level: Supervision or verbal cues Assist Level: Supervision or verbal cues Assist Level: Supervision or verbal cues Turns around,maneuvers to table,bed, and toilet,negotiates 3% grade,maneuvers on rugs and over doorsills: No Function - Locomotion: Ambulation Ambulation activity did not occur: Safety/medical concerns (pt pushes toward hemi side; unsafe) Assistive device: Rail in hallway, Orthosis Max distance: 10 Assist level: 2 helpers Walk 10 feet activity did not occur: Safety/medical concerns Assist level: 2 helpers Walk 50 feet with 2 turns activity did not occur: Safety/medical concerns Walk 150 feet activity did not occur: Safety/medical concerns Walk 10 feet on uneven surfaces activity did not occur: Safety/medical concerns  Function - Comprehension Comprehension: Auditory Comprehension assist level: Follows basic conversation/direction with no assist  Function - Expression Expression: Verbal Expression assist level: Expresses complex ideas: With extra time/assistive device  Function - Social Interaction Social Interaction assist level: Interacts appropriately 90% of the time - Needs monitoring or encouragement for participation or interaction.  Function - Problem  Solving Problem solving assist level: Solves basic 50 - 74% of the time/requires cueing 25 - 49% of the time  Function - Memory Memory assist level: Recognizes or recalls 50 - 74% of the time/requires cueing 25 - 49% of the time Patient normally able to recall (first 3 days only): Current season, Location of own room, Staff names and faces, That he or she is in a hospital  Medical Problem List and Plan: 1.  Left hemiplegia, limitations with self-care, difficulty with transfers secondary to right MCA CVA.   Cont CIR PT,  OT, speech therapies.  2.  DVT Prophylaxis/Anticoagulation: Pharmaceutical: Xarelto for PE, Hgb normal 3. Pain Management: Left hip/groin pain may be spasms , plus minus. Osteoarthritis. Check x-ray, Zanaflex at night  4. Mood: Team to provide ego support. Expressing anxiety about current situation. Has supportive family. LCSW to follow for evaluation and support.  5. Neuropsych: This patient is capable of making decisions on his own behalf. 6. Skin/Wound Care: routine pressure relief measures.  7. Fluids/Electrolytes/Nutrition: Monitor I/O. Check lytes in am. Encourage fluid intake.  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 9. T2DM: Hgb A1C- 10.5 --. Continue metformin 1000mg  bid. Was started on lantus  45 units daily at bedtime, with SSI for elevated BS--will consult RD for diabetic education and start education on insulin administration.  Will monitor BS ac/hs,at goal 3/28 10.  Acute/subacute right lower lobe pulmonary embolism. Xarelto,  neurologically stable CBG (last 3)   Recent Labs  09/17/16 1705 09/17/16 2106 09/18/16 0642  GLUCAP 159* 92 93    10. HTN: Monitor BP bid--on lisinopril 20mg  daily.stabilizing in normal range 3/23 Vitals:   09/17/16 1615 09/18/16 0529  BP: 101/60 135/81  Pulse: 97 74  Resp: 18 17  Temp: 97.6 F (36.4 C) 97.9 F (36.6 C)   11. Leucocytosis: Monitor for signs of  infection. UA negative. Afebrile,WBC 12.9 12. Polycythemia: Smokes 1.5 PPD. Expect this to remain elevated during hospitalization, On NicoDerm patch 13. COPD: SOB reported--CXR negative. Encourage tobacco cessation.  Managed without medications at this time.   14. Tobacco abuse: Counsel  14. Hypoalbuminemia, start pro-stat 15. Urinary retention,   BPH, trial of Flomax,  Urecholine stopped no ICP for > 1wk 16. Aortic aneursym infrarenal 6.8cm--- Conservative mgt for now---he spoke to son at length. Dr. Donzetta Matters will follow up with patient in office once he's further recovered from CVA  LOS (Days) 24 A FACE TO [place] E 09/18/2016, 10:27 AM

## 2016-09-18 NOTE — Discharge Instructions (Signed)
Inpatient Rehab Discharge Instructions  Chad Combs Discharge date and time:    Activities/Precautions/ Functional Status: Activity: no lifting, driving, or strenuous exercise till cleared by MD.  Diet: cardiac diet and diabetic diet Wound Care: none needed   Functional status:  ___ No restrictions     ___ Walk up steps independently _X__ 24/7 supervision/assistance   ___ Walk up steps with assistance ___ Intermittent supervision/assistance  ___ Bathe/dress independently ___ Walk with walker     _X__ Bathe/dress with assistance ___ Walk Independently    ___ Shower independently ___ Walk with assistance    ___ Shower with assistance _X__ No alcohol     ___ Return to work/school ________  Special Instructions: 1. Check blood sugars 2-3 times a day --before meals or at bedtime.  2. Family to assist patient with medication management.    STROKE/TIA DISCHARGE INSTRUCTIONS SMOKING Cigarette smoking nearly doubles your risk of having a stroke & is the single most alterable risk factor  If you smoke or have smoked in the last 12 months, you are advised to quit smoking for your health.  Most of the excess cardiovascular risk related to smoking disappears within a year of stopping.  Ask you doctor about anti-smoking medications  [place] Quit Line: 1-800-QUIT NOW  Free Smoking Cessation Classes [phone]  CHOLESTEROL Know your levels; limit fat & cholesterol in your diet  Lipid Panel     Component Value Date/Time   CHOL 193 08/22/2016 0747   TRIG 441 (H) 08/22/2016 0747   HDL 35 (L) 08/22/2016 0747   CHOLHDL 5.5 08/22/2016 0747   VLDL UNABLE TO CALCULATE IF TRIGLYCERIDE OVER 400 mg/dL 08/22/2016 0747   LDLCALC UNABLE TO CALCULATE IF TRIGLYCERIDE OVER 400 mg/dL 08/22/2016 0747      Many patients benefit from treatment even if their cholesterol is at goal.  Goal: Total Cholesterol (CHOL) less than 160  Goal:  Triglycerides (TRIG) less than 150  Goal:  HDL greater than  40  Goal:  LDL (LDLCALC) less than 100   BLOOD PRESSURE American Stroke Association blood pressure target is less that 120/80 mm/Hg  Your discharge blood pressure is:  BP: 130/75  Monitor your blood pressure  Limit your salt and alcohol intake  Many individuals will require more than one medication for high blood pressure  DIABETES (A1c is a blood sugar average for last 3 months) Goal HGBA1c is under 7% (HBGA1c is blood sugar average for last 3 months)  Diabetes:     Lab Results  Component Value Date   HGBA1C 10.5 (H) 08/22/2016     Your HGBA1c can be lowered with medications, healthy diet, and exercise.  Check your blood sugar as directed by your physician  Call your physician if you experience unexplained or low blood sugars.  PHYSICAL ACTIVITY/REHABILITATION Goal is 30 minutes at least 4 days per week  Activity: No driving, Therapies: See above Return to work: N/A  Activity decreases your risk of heart attack and stroke and makes your heart stronger.  It helps control your weight and blood pressure; helps you relax and can improve your mood.  Participate in a regular exercise program.  Talk with your doctor about the best form of exercise for you (dancing, walking, swimming, cycling).  DIET/WEIGHT Goal is to maintain a healthy weight  Your discharge diet is: DIET DYS 3 Room service appropriate? Yes with Assist; Fluid consistency: Thin liquids Your height is:  Height: 5\' 9"  (175.3 cm) Your current weight is: Weight:  92 kg (202 lb 13.2 oz) Your Body Mass Index (BMI) is:  BMI (Calculated): 30.5  Following the type of diet specifically designed for you will help prevent another stroke.  Your goal weight is:    Your goal Body Mass Index (BMI) is 19-24.  Healthy food habits can help reduce 3 risk factors for stroke:  High cholesterol, hypertension, and excess weight.  RESOURCES Stroke/Support Group:  Call [phone]   STROKE EDUCATION PROVIDED/REVIEWED AND GIVEN TO  PATIENT Stroke warning signs and symptoms How to activate emergency medical system (call 911). Medications prescribed at discharge. Need for follow-up after discharge. Personal risk factors for stroke. Pneumonia vaccine given:  Flu vaccine given:  My questions have been answered, the writing is legible, and I understand these instructions.  I will adhere to these goals & educational materials that have been provided to me after my discharge from the hospital.     My questions have been answered and I understand these instructions. I will adhere to these goals and the provided educational materials after my discharge from the hospital.  Patient/Caregiver Signature _______________________________ Date __________  Clinician Signature _______________________________________ Date __________  Please bring this form and your medication list with you to all your follow-up doctor's appointments.  ---------------------------------------------------------------------------------------------------------------------------------------------------------------------------------------------------------------------- Information on my medicine - XARELTO (rivaroxaban)  This medication education was reviewed with me or my healthcare representative as part of my discharge preparation.  The pharmacist that spoke with me during my hospital stay was:  Einar Grad, [facility]  WHY WAS [place]? Xarelto was prescribed to treat blood clots that may have been found in the veins of your legs (deep vein thrombosis) or in your lungs (pulmonary embolism) and to reduce the risk of them occurring again.  What do you need to know about Xarelto? The starting dose is one 15 mg tablet taken TWICE daily with food for the FIRST 21 DAYS then on (enter date)  10/08/16  the dose is changed to one 20 mg tablet taken ONCE A DAY with your evening meal.  DO NOT stop taking Xarelto without talking to the health  care provider who prescribed the medication.  Refill your prescription for 20 mg tablets before you run out.  After discharge, you should have regular check-up appointments with your healthcare provider that is prescribing your Xarelto.  In the future your dose may need to be changed if your kidney function changes by a significant amount.  What do you do if you miss a dose? If you are taking Xarelto TWICE DAILY and you miss a dose, take it as soon as you remember. You may take two 15 mg tablets (total 30 mg) at the same time then resume your regularly scheduled 15 mg twice daily the next day.  If you are taking Xarelto ONCE DAILY and you miss a dose, take it as soon as you remember on the same day then continue your regularly scheduled once daily regimen the next day. Do not take two doses of Xarelto at the same time.   Important Safety Information Xarelto is a blood thinner medicine that can cause bleeding. You should call your healthcare provider right away if you experience any of the following: ? Bleeding from an injury or your nose that does not stop. ? Unusual colored urine (red or dark brown) or unusual colored stools (red or black). ? Unusual bruising for unknown reasons. ? A serious fall or if you hit your head (even if there is no bleeding).  Some medicines may interact with Xarelto and might increase your risk of bleeding while on Xarelto. To help avoid this, consult your healthcare provider or pharmacist prior to using any new prescription or non-prescription medications, including herbals, vitamins, non-steroidal anti-inflammatory drugs (NSAIDs) and supplements.  This website has more information on Xarelto: [email or web address]

## 2016-09-18 NOTE — Progress Notes (Signed)
Speech Language Pathology Daily Session Note  Patient Details  Name: Chad Combs MRN: [id number] Date of Birth: May 09, 1956  Today's Date: 09/18/2016 SLP Individual Time: 7564-3329 SLP Individual Time Calculation (min): 25 min  Short Term Goals: Week 4: SLP Short Term Goal 1 (Week 4): Pt will demonstrate sustained attention in moderately distracting environment for ~30 minutes with Min A verbal cues.  SLP Short Term Goal 2 (Week 4): Pt will complete basic, familiar problem solving tasks with Mod A verbal cues.  SLP Short Term Goal 3 (Week 4): Pt will scan to left of his environment to locate items within basic, familiar tasks with Mod A verbal cues.  SLP Short Term Goal 4 (Week 4): Pt will demonstrate anticipatory awareness by identifying 3 activites that are safe to participate in within home environement with Mod A verbal cues.   Skilled Therapeutic Interventions: Skilled treatment session focused on cognitive goals. SLP facilitated session by providing Mod A verbal cues for sustained attention to task for ~5 minute intervals due to verbal tangents. Patient recalled rules to a previously learned task with 50% accuracy and utilized compensatory strategy of association to maximize recall/carryover of information with task with Mod A verbal cues. Patient was returned to room, left upright in wheelchair, safety belt donned and all needs within reach. Continue with current plan of care.          Function:   Cognition Comprehension Comprehension assist level: Follows basic conversation/direction with no assist  Expression   Expression assist level: Expresses complex ideas: With extra time/assistive device  Social Interaction Social Interaction assist level: Interacts appropriately 90% of the time - Needs monitoring or encouragement for participation or interaction.  Problem Solving Problem solving assist level: Solves basic 50 - 74% of the time/requires cueing 25 - 49% of the time  Memory  Memory assist level: Recognizes or recalls 50 - 74% of the time/requires cueing 25 - 49% of the time    Pain No/Denies Pain   Therapy/Group: Individual Therapy  [name] 09/18/2016, 3:06 PM

## 2016-09-18 NOTE — Progress Notes (Signed)
Occupational Therapy Session Note  Patient Details  Name: Chad Combs MRN: [id number] Date of Birth: 1956-02-05  Today's Date: 09/18/2016 OT Individual Time: 5035-4656 OT Individual Time Calculation (min): 45 min    Short Term Goals: Week 3:  OT Short Term Goal 1 (Week 3): Pt will consistently complete transfer to toilet with mod A of 1 person A only. OT Short Term Goal 1 - Progress (Week 3): Met OT Short Term Goal 2 (Week 3): Pt will be able to pull pants over hips himself with min A to stabilize balance. OT Short Term Goal 2 - Progress (Week 3): Not met OT Short Term Goal 3 (Week 3): Pt will be able to wash R and L leg arm using a long handled sponge with S. OT Short Term Goal 3 - Progress (Week 3): Progressing toward goal OT Short Term Goal 4 (Week 3): Pt will be able to wt shift to L side safely to wash bottom with lateral lean on tub bench with steadying A. OT Short Term Goal 4 - Progress (Week 3): Other (comment) (Not addressed)  Skilled Therapeutic Interventions/Progress Updates:    1:1. Focus on ADL retraining. Pt transitions supine>EOB> squat pivot transfer w/c with MIN A and VC for LE management, safety awareness and hand placement. In w/c at sink pt bathes in sitting with HOH A for LUE to wash RUE with wash cloth with A to reach end ranges. Pt declines bathing buttocks and peri area. Pt dons pull over shirt with supervision and VC to look into mirror to see where shirt should be pulled down. With VC, Pt able to assume seated figure 4 and hold in place today without A to don B socks and thread pants into pant leg. Pt sit to stand at sink with supervision and VC to move R hand up sink and shift weight to right. As pt attempts to reach back to advance pants over hips, Pt flexes trunk and lightly bump soap dispenser with forehead. RN notified. OT cues and provides MOD A to regain proper posture and OT advances pants up hips. In sitting pt brushes teeth with supervision. Exited session  with pt seated in w/c with QRB on, call light in reach and all needs met.   Therapy Documentation Precautions:  Precautions Precautions: Fall Precaution Comments: L sided neglect, shoulder increased risk for subluxation; quick LOB L wihtout awareness Restrictions Weight Bearing Restrictions: No   See Function Navigator for Current Functional Status.   Therapy/Group: Individual Therapy  Tonny Branch 09/18/2016, 12:26 PM

## 2016-09-19 ENCOUNTER — Inpatient Hospital Stay (HOSPITAL_COMMUNITY): Payer: Self-pay

## 2016-09-19 ENCOUNTER — Inpatient Hospital Stay (HOSPITAL_COMMUNITY): Payer: Self-pay | Admitting: Physical Therapy

## 2016-09-19 LAB — GLUCOSE, CAPILLARY
GLUCOSE-CAPILLARY: 150 mg/dL — AB (ref 65–99)
Glucose-Capillary: 111 mg/dL — ABNORMAL HIGH (ref 65–99)
Glucose-Capillary: 133 mg/dL — ABNORMAL HIGH (ref 65–99)
Glucose-Capillary: 125 mg/dL — ABNORMAL HIGH (ref 65–99)

## 2016-09-19 NOTE — Progress Notes (Signed)
Physical Therapy Session Note  Patient Details  Name: Chad Combs MRN: [id number] Date of Birth: 01/08/1956  Today's Date: 09/19/2016 PT Individual Time: 1406-1501 PT Individual Time Calculation (min): 55 min   Short Term Goals: Week 4:  PT Short Term Goal 1 (Week 4): STG =LTG due to ELSO  Skilled Therapeutic Interventions/Progress Updates:  Pt received asleep in bed but easily awakened & agreeable to tx. Pt transferred supine>sitting EOB with min assist, HOB elevated, & bed rails. Therapist donned pt's shoes & L AFO total assist for time management. Pt transferred bed>w/c on R via squat pivot with min assist. Pt propelled w/c room<>gym and throughout unit with R hemi technique, then throughout obstacle course with supervision. Pt requires moderate cuing for obstacle avoidance on L with poor carryover of learned information. Pt completed squat pivot transfer w/c<>couch in dayroom with min assist but with poor eccentric control when lowering to seat. Pt observed to be incontinent of urine; pt aware he was wet but notes he didn't alert therapist because it was so urgent. Pt continues to state "it's okay" but therapist educated him on importance of reporting his need to use restroom. Assisted pt back to room where he transferred to standing at sink to allow therapist to doff soiled clothing. Pt able to perform peri-hygiene with assistance for standing balance. Pt then donned gown with max assist and cuing to don clothing with hemi-technique. Pt transferred back to bed via squat pivot and transferred sit>supine with min assist. Pt left in bed with all needs within reach and bed alarm set.  Therapy Documentation Precautions:  Precautions Precautions: Fall Precaution Comments: L sided neglect, shoulder increased risk for subluxation; quick LOB L wihtout awareness Restrictions Weight Bearing Restrictions: No  Pain: Denies c/o pain.   See Function Navigator for Current Functional  Status.   Therapy/Group: Individual Therapy  Waunita Schooner 09/19/2016, 3:51 PM

## 2016-09-19 NOTE — Progress Notes (Addendum)
Subjective/Complaints:  No issues overnite, speaking to his sister in [place] by phone  ROS: pt denies nausea, vomiting, diarrhea, cough, shortness of breath or chest pain    Objective: Vital Signs: Blood pressure 130/75, pulse 76, temperature 98 F (36.7 C), temperature source Oral, resp. rate 16, height 5\' 9"  (1.753 m), weight 90.7 kg (199 lb 14.4 oz), SpO2 96 %. No results found. Results for orders placed or performed during the hospital encounter of 08/25/16 (from the past 72 hour(s))  Heparin level (unfractionated)     Status: None   Collection Time: 09/16/16 11:02 AM  Result Value Ref Range   Heparin Unfractionated 0.48 0.30 - 0.70 IU/mL    Comment:        IF HEPARIN RESULTS ARE BELOW EXPECTED VALUES, AND PATIENT DOSAGE HAS BEEN CONFIRMED, SUGGEST FOLLOW UP TESTING OF ANTITHROMBIN III LEVELS.   Glucose, capillary     Status: Abnormal   Collection Time: 09/16/16 12:07 PM  Result Value Ref Range   Glucose-Capillary 126 (H) 65 - 99 mg/dL  Heparin level (unfractionated)     Status: None   Collection Time: 09/16/16  4:21 PM  Result Value Ref Range   Heparin Unfractionated 0.49 0.30 - 0.70 IU/mL    Comment:        IF HEPARIN RESULTS ARE BELOW EXPECTED VALUES, AND PATIENT DOSAGE HAS BEEN CONFIRMED, SUGGEST FOLLOW UP TESTING OF ANTITHROMBIN III LEVELS.   Glucose, capillary     Status: Abnormal   Collection Time: 09/16/16  4:38 PM  Result Value Ref Range   Glucose-Capillary 127 (H) 65 - 99 mg/dL  Glucose, capillary     Status: Abnormal   Collection Time: 09/16/16  9:10 PM  Result Value Ref Range   Glucose-Capillary 40 (LL) 65 - 99 mg/dL  Glucose, capillary     Status: Abnormal   Collection Time: 09/16/16  9:20 PM  Result Value Ref Range   Glucose-Capillary 115 (H) 65 - 99 mg/dL  Heparin level (unfractionated)     Status: None   Collection Time: 09/17/16  6:22 AM  Result Value Ref Range   Heparin Unfractionated 0.52 0.30 - 0.70 IU/mL    Comment:        IF HEPARIN  RESULTS ARE BELOW EXPECTED VALUES, AND PATIENT DOSAGE HAS BEEN CONFIRMED, SUGGEST FOLLOW UP TESTING OF ANTITHROMBIN III LEVELS.   CBC     Status: None   Collection Time: 09/17/16  6:22 AM  Result Value Ref Range   WBC 9.6 4.0 - 10.5 K/uL   RBC 4.84 4.22 - 5.81 MIL/uL   Hemoglobin 15.8 13.0 - 17.0 g/dL   HCT 46.9 39.0 - 52.0 %   MCV 96.9 78.0 - 100.0 fL   MCH 32.6 26.0 - 34.0 pg   MCHC 33.7 30.0 - 36.0 g/dL   RDW 12.0 11.5 - 15.5 %   Platelets 191 150 - 400 K/uL  Glucose, capillary     Status: Abnormal   Collection Time: 09/17/16  6:43 AM  Result Value Ref Range   Glucose-Capillary 137 (H) 65 - 99 mg/dL  Glucose, capillary     Status: Abnormal   Collection Time: 09/17/16 12:22 PM  Result Value Ref Range   Glucose-Capillary 129 (H) 65 - 99 mg/dL  Glucose, capillary     Status: Abnormal   Collection Time: 09/17/16  5:05 PM  Result Value Ref Range   Glucose-Capillary 159 (H) 65 - 99 mg/dL  Glucose, capillary     Status: None   Collection Time:  09/17/16  9:06 PM  Result Value Ref Range   Glucose-Capillary 92 65 - 99 mg/dL  Glucose, capillary     Status: None   Collection Time: 09/18/16  6:42 AM  Result Value Ref Range   Glucose-Capillary 93 65 - 99 mg/dL  Glucose, capillary     Status: Abnormal   Collection Time: 09/18/16 11:48 AM  Result Value Ref Range   Glucose-Capillary 140 (H) 65 - 99 mg/dL   Comment 1 Notify RN   Glucose, capillary     Status: None   Collection Time: 09/18/16  4:19 PM  Result Value Ref Range   Glucose-Capillary 87 65 - 99 mg/dL   Comment 1 Notify RN   Glucose, capillary     Status: Abnormal   Collection Time: 09/18/16  8:45 PM  Result Value Ref Range   Glucose-Capillary 126 (H) 65 - 99 mg/dL  Glucose, capillary     Status: Abnormal   Collection Time: 09/19/16  6:26 AM  Result Value Ref Range   Glucose-Capillary 111 (H) 65 - 99 mg/dL     HEENT: normal Cardio: RRR Resp: CTA bilaterally GI: BS + Extremity:  No edema Skin:   Intact Neuro:   Flat, Abnormal Sensory Abnormal Motor Motor is 5/5 in the right deltoid, biceps, triceps, grip, hip flexor, knee extensor, ankle dorsiflexor, 2- left biceps and finger flexors, 2 minus, left hip, knee extensor synergy, 0 at the ankle, Abnormal FMC Ataxic/ dec FMC and Dysarthric Musc/Skel:  Other No pain with upper extremity or lower extremity range of motion Gen. no acute distress   Assessment/Plan: 1. Functional deficits secondary to Right MCA infarct with Left Hemiparesis which require 3+ hours per day of interdisciplinary therapy in a comprehensive inpatient rehab setting. Physiatrist is providing close team supervision and 24 hour management of active medical problems listed below. Physiatrist and rehab team continue to assess barriers to discharge/monitor patient progress toward functional and medical goals. FIM: Function - Bathing Position: Wheelchair/chair at sink Body parts bathed by patient: Left arm, Chest, Abdomen, Front perineal area, Right upper leg, Left upper leg, Right lower leg, Left lower leg Body parts bathed by helper: Right arm, Back, Buttocks Bathing not applicable: Buttocks, Front perineal area Assist Level: Touching or steadying assistance(Pt > 75%)  Function- Upper Body Dressing/Undressing What is the patient wearing?: Pull over shirt/dress Pull over shirt/dress - Perfomed by patient: Thread/unthread right sleeve, Put head through opening, Pull shirt over trunk, Thread/unthread left sleeve Pull over shirt/dress - Perfomed by helper: Thread/unthread left sleeve Button up shirt - Perfomed by patient: Thread/unthread right sleeve, Thread/unthread left sleeve Button up shirt - Perfomed by helper: Pull shirt around back, Button/unbutton shirt Assist Level: Supervision or verbal cues Function - Lower Body Dressing/Undressing Lower body dressing/undressing activity did not occur:  (did not occur with this clinician today) What is the patient wearing?: Pants, Non-skid  slipper socks Position: Wheelchair/chair at sink Underwear - Performed by helper: Thread/unthread right underwear leg, Thread/unthread left underwear leg, Pull underwear up/down Pants- Performed by patient: Thread/unthread right pants leg, Thread/unthread left pants leg Pants- Performed by helper: Pull pants up/down Non-skid slipper socks- Performed by patient: Don/doff right sock, Don/doff left sock Non-skid slipper socks- Performed by helper:  (A to stabilize leg in figure 4) Socks - Performed by patient: Don/doff left sock Shoes - Performed by patient: Don/doff right shoe (elastic shoe laces) Shoes - Performed by helper: Don/doff left shoe AFO - Performed by patient: Don/doff left AFO AFO - Performed by helper:  Don/doff left AFO Assist for footwear: Maximal assist Assist for lower body dressing: 2 Helpers  Function - Toileting Toileting activity did not occur: No continent bowel/bladder event Toileting steps completed by patient: Performs perineal hygiene Toileting steps completed by helper: Adjust clothing prior to toileting Toileting Assistive Devices: Grab bar or rail Assist level:  (MAX A)  Function - Air cabin crew transfer activity did not occur:  (using urinal or incont., no BM) Toilet transfer assistive device: Elevated toilet seat/BSC over toilet, Grab bar Mechanical lift: Stedy Assist level to toilet: Touching or steadying assistance (Pt > 75%) Assist level from toilet: Touching or steadying assistance (Pt > 75%)  Function - Chair/bed transfer Chair/bed transfer method: Squat pivot Chair/bed transfer assist level: Touching or steadying assistance (Pt > 75%) Chair/bed transfer assistive device: Armrests Mechanical lift: Stedy Chair/bed transfer details: Manual facilitation for weight shifting, Verbal cues for precautions/safety, Verbal cues for technique, Manual facilitation for placement  Function - Locomotion: Wheelchair Will patient use wheelchair at  discharge?: Yes Type: Manual Max wheelchair distance: 131ft  Assist Level: Supervision or verbal cues Assist Level: Supervision or verbal cues Assist Level: Supervision or verbal cues Turns around,maneuvers to table,bed, and toilet,negotiates 3% grade,maneuvers on rugs and over doorsills: No Function - Locomotion: Ambulation Ambulation activity did not occur: Safety/medical concerns (pt pushes toward hemi side; unsafe) Assistive device: Rail in hallway, Orthosis Max distance: 10 Assist level: 2 helpers Walk 10 feet activity did not occur: Safety/medical concerns Assist level: 2 helpers Walk 50 feet with 2 turns activity did not occur: Safety/medical concerns Walk 150 feet activity did not occur: Safety/medical concerns Walk 10 feet on uneven surfaces activity did not occur: Safety/medical concerns  Function - Comprehension Comprehension: Auditory Comprehension assist level: Understands basic 90% of the time/cues < 10% of the time  Function - Expression Expression: Verbal Expression assist level: Expresses basic needs/ideas: With no assist  Function - Social Interaction Social Interaction assist level: Interacts appropriately 90% of the time - Needs monitoring or encouragement for participation or interaction.  Function - Problem Solving Problem solving assist level: Solves basic problems with no assist  Function - Memory Memory assist level: Recognizes or recalls 90% of the time/requires cueing < 10% of the time Patient normally able to recall (first 3 days only): Current season, Location of own room, Staff names and faces, That he or she is in a hospital  Medical Problem List and Plan: 1.  Left hemiplegia, limitations with self-care, difficulty with transfers secondary to right MCA CVA.   Cont CIR PT, OT, speech therapies. Neurostatus stable 2.  DVT Prophylaxis/Anticoagulation: Pharmaceutical: Xarelto for PE, expect at least 3 mo tx 3. Pain Management: Left hip/groin pain  may be spasms , plus minus. Osteoarthritis. Check x-ray, Zanaflex at night  4. Mood: Team to provide ego support. Expressing anxiety about current situation. Has supportive family. LCSW to follow for evaluation and support.  5. Neuropsych: This patient is capable of making decisions on his own behalf. 6. Skin/Wound Care: routine pressure relief measures.  7. Fluids/Electrolytes/Nutrition: Monitor I/O. Check lytes in am. Encourage fluid intake.  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 9. T2DM: Hgb A1C- 10.5 --. Continue metformin 1000mg  bid. Was started on lantus  45 units daily at bedtime, with SSI for elevated BS--will consult RD for diabetic education and start education on insulin administration.  Will monitor BS ac/hs,at goal 4/1 10.  Acute/subacute right lower lobe pulmonary embolism. Xarelto,  neurologically stable CBG (last 3)   Recent Labs  09/18/16 1619 09/18/16 2045 09/19/16 0626  GLUCAP 87 126* 111*    10. HTN: Monitor BP bid--on lisinopril 20mg  daily.stabilizing in normal range 4/1 Vitals:   09/18/16 0529 09/19/16 0511  BP: 135/81 130/75  Pulse: 74 76  Resp: 17 16  Temp: 97.9 F (36.6 C) 98 F (36.7 C)   11. Leucocytosis: Monitor for signs of infection. UA negative. Afebrile,WBC 12.9 12. Polycythemia: Smokes 1.5 PPD. Expect this to remain elevated during hospitalization, On NicoDerm patch 13. COPD: SOB reported--CXR negative. Encourage tobacco cessation.  Managed without medications at this time.   14. Tobacco abuse: Counsel  14. Hypoalbuminemia, start pro-stat 15. Urinary retention,   BPH, trial of Flomax,  Urecholine stopped no ICP for > 1wk 16. Aortic aneursym infrarenal 6.8cm--- Conservative mgt for now---he spoke to son at length. Dr. Donzetta Matters will follow up with patient in office once he's further recovered from CVA  LOS (Days) 25 A FACE TO FACE EVALUATION WAS Heeney E 09/19/2016,  6:56 AM

## 2016-09-19 NOTE — Progress Notes (Signed)
Occupational Therapy Session Note  Patient Details  Name: Chad Combs MRN: [id number] Date of Birth: 17-May-1956  Today's Date: 09/19/2016 OT Individual Time: 0700-0800 OT Individual Time Calculation (min): 60 min    Short Term Goals: Week 3:  OT Short Term Goal 1 (Week 3): Pt will consistently complete transfer to toilet with mod A of 1 person A only. OT Short Term Goal 1 - Progress (Week 3): Met OT Short Term Goal 2 (Week 3): Pt will be able to pull pants over hips himself with min A to stabilize balance. OT Short Term Goal 2 - Progress (Week 3): Not met OT Short Term Goal 3 (Week 3): Pt will be able to wash R and L leg arm using a long handled sponge with S. OT Short Term Goal 3 - Progress (Week 3): Progressing toward goal OT Short Term Goal 4 (Week 3): Pt will be able to wt shift to L side safely to wash bottom with lateral lean on tub bench with steadying A. OT Short Term Goal 4 - Progress (Week 3): Other (comment) (Not addressed)  Skilled Therapeutic Interventions/Progress Updates:    1:1 focus on ADL retraining. No pain reported. Pt decline opportunity to bathe this date. Pt squat pivot transfer throughout session EOB>w/c<>EOM with MIN A-supervision with Vc for safety awareness and set up of w/c for transfers. Pt don pull over shirt at sink. Pt able to catch mistake when not using hemi technique appropriately. Pt don pull up pants, socks and shoes in seated figure 4 to thread BLE into pants. Pt sit to stand at sink throughout session with MIN A for balance andVC for weight shiftingosture and foot placement prior to stand. Pt able to maintain trunk control in standing today and advance pants over hips with MIN A for balancePt require 3 trials to don L shoe with AFO using shoe long handled shoe horn with VC for technique. Pt eats breakfast EOM with supervision and VC per SLP feeding techniques. Pt demo intention tremors in RUE when completing hand to mouth excursion and trialed weighted fork  which pt reported helped minimally. Exited session with pt seated in w/c with call light in reach and nurse tech aware to supervise pt finishing meal.   Therapy Documentation Precautions:  Precautions Precautions: Fall Precaution Comments: L sided neglect, shoulder increased risk for subluxation; quick LOB L wihtout awareness Restrictions Weight Bearing Restrictions: No General:    See Function Navigator for Current Functional Status.   Therapy/Group: Individual Therapy  Tonny Branch 09/19/2016, 10:07 AM

## 2016-09-20 ENCOUNTER — Inpatient Hospital Stay (HOSPITAL_COMMUNITY): Payer: Self-pay | Admitting: Physical Therapy

## 2016-09-20 ENCOUNTER — Inpatient Hospital Stay (HOSPITAL_COMMUNITY): Payer: Self-pay | Admitting: Occupational Therapy

## 2016-09-20 LAB — GLUCOSE, CAPILLARY
GLUCOSE-CAPILLARY: 115 mg/dL — AB (ref 65–99)
GLUCOSE-CAPILLARY: 97 mg/dL (ref 65–99)
Glucose-Capillary: 142 mg/dL — ABNORMAL HIGH (ref 65–99)
Glucose-Capillary: 119 mg/dL — ABNORMAL HIGH (ref 65–99)

## 2016-09-20 NOTE — Progress Notes (Signed)
Subjective/Complaints:  Left hip pain seemed to improve with repositioning  ROS: pt denies nausea, vomiting, diarrhea, cough, shortness of breath or chest pain    Objective: Vital Signs: Blood pressure 130/74, pulse 72, temperature 97.7 F (36.5 C), temperature source Oral, resp. rate 18, height 5\' 9"  (1.753 m), weight 90.7 kg (199 lb 14.4 oz), SpO2 97 %. No results found. Results for orders placed or performed during the hospital encounter of 08/25/16 (from the past 72 hour(s))  Glucose, capillary     Status: Abnormal   Collection Time: 09/17/16  6:43 AM  Result Value Ref Range   Glucose-Capillary 137 (H) 65 - 99 mg/dL  Glucose, capillary     Status: Abnormal   Collection Time: 09/17/16 12:22 PM  Result Value Ref Range   Glucose-Capillary 129 (H) 65 - 99 mg/dL  Glucose, capillary     Status: Abnormal   Collection Time: 09/17/16  5:05 PM  Result Value Ref Range   Glucose-Capillary 159 (H) 65 - 99 mg/dL  Glucose, capillary     Status: None   Collection Time: 09/17/16  9:06 PM  Result Value Ref Range   Glucose-Capillary 92 65 - 99 mg/dL  Glucose, capillary     Status: None   Collection Time: 09/18/16  6:42 AM  Result Value Ref Range   Glucose-Capillary 93 65 - 99 mg/dL  Glucose, capillary     Status: Abnormal   Collection Time: 09/18/16 11:48 AM  Result Value Ref Range   Glucose-Capillary 140 (H) 65 - 99 mg/dL   Comment 1 Notify RN   Glucose, capillary     Status: None   Collection Time: 09/18/16  4:19 PM  Result Value Ref Range   Glucose-Capillary 87 65 - 99 mg/dL   Comment 1 Notify RN   Glucose, capillary     Status: Abnormal   Collection Time: 09/18/16  8:45 PM  Result Value Ref Range   Glucose-Capillary 126 (H) 65 - 99 mg/dL  Glucose, capillary     Status: Abnormal   Collection Time: 09/19/16  6:26 AM  Result Value Ref Range   Glucose-Capillary 111 (H) 65 - 99 mg/dL  Glucose, capillary     Status: Abnormal   Collection Time: 09/19/16 12:17 PM  Result Value Ref  Range   Glucose-Capillary 133 (H) 65 - 99 mg/dL   Comment 1 Notify RN   Glucose, capillary     Status: Abnormal   Collection Time: 09/19/16  4:50 PM  Result Value Ref Range   Glucose-Capillary 125 (H) 65 - 99 mg/dL   Comment 1 Notify RN   Glucose, capillary     Status: Abnormal   Collection Time: 09/19/16  9:10 PM  Result Value Ref Range   Glucose-Capillary 150 (H) 65 - 99 mg/dL  Glucose, capillary     Status: Abnormal   Collection Time: 09/20/16  6:32 AM  Result Value Ref Range   Glucose-Capillary 115 (H) 65 - 99 mg/dL     HEENT: normal Cardio: RRR Resp: CTA bilaterally GI: BS + Extremity:  No edema Skin:   Intact Neuro:  Flat, Abnormal Sensory Abnormal Motor Motor is 5/5 in the right deltoid, biceps, triceps, grip, hip flexor, knee extensor, ankle dorsiflexor, 2- left biceps and finger flexors, 2 minus, left hip, knee extensor synergy, 0 at the ankle, Abnormal FMC Ataxic/ dec FMC and Dysarthric, increasing LUE extensor tone Musc/Skel:  Other No pain with upper extremity or lower extremity range of motion Gen. no acute distress  Assessment/Plan: 1. Functional deficits secondary to Right MCA infarct with Left Hemiparesis which require 3+ hours per day of interdisciplinary therapy in a comprehensive inpatient rehab setting. Physiatrist is providing close team supervision and 24 hour management of active medical problems listed below. Physiatrist and rehab team continue to assess barriers to discharge/monitor patient progress toward functional and medical goals. FIM: Function - Bathing Position: Wheelchair/chair at sink Body parts bathed by patient: Left arm, Chest, Abdomen, Front perineal area, Right upper leg, Left upper leg, Right lower leg, Left lower leg Body parts bathed by helper: Right arm, Back, Buttocks Bathing not applicable: Buttocks, Front perineal area Assist Level: Touching or steadying assistance(Pt > 75%)  Function- Upper Body Dressing/Undressing What is the  patient wearing?: Pull over shirt/dress Pull over shirt/dress - Perfomed by patient: Thread/unthread right sleeve, Put head through opening, Pull shirt over trunk, Thread/unthread left sleeve Pull over shirt/dress - Perfomed by helper: Thread/unthread left sleeve Button up shirt - Perfomed by patient: Thread/unthread right sleeve, Thread/unthread left sleeve Button up shirt - Perfomed by helper: Pull shirt around back, Button/unbutton shirt Assist Level: Supervision or verbal cues Function - Lower Body Dressing/Undressing Lower body dressing/undressing activity did not occur:  (did not occur with this clinician today) What is the patient wearing?: Pants, Socks, Shoes, AFO Position: Wheelchair/chair at sink Underwear - Performed by helper: Thread/unthread right underwear leg, Thread/unthread left underwear leg, Pull underwear up/down Pants- Performed by patient: Thread/unthread right pants leg, Thread/unthread left pants leg, Pull pants up/down Pants- Performed by helper: Pull pants up/down Non-skid slipper socks- Performed by patient: Don/doff right sock, Don/doff left sock Non-skid slipper socks- Performed by helper:  (A to stabilize leg in figure 4) Socks - Performed by patient: Don/doff left sock Shoes - Performed by patient: Don/doff right shoe, Don/doff left shoe (elastic laces) Shoes - Performed by helper: Don/doff left shoe AFO - Performed by patient: Don/doff left AFO AFO - Performed by helper: Don/doff left AFO Assist for footwear: Supervision/touching assist Assist for lower body dressing: Touching or steadying assistance (Pt > 75%)  Function - Toileting Toileting activity did not occur: No continent bowel/bladder event Toileting steps completed by patient: Performs perineal hygiene Toileting steps completed by helper: Adjust clothing prior to toileting Toileting Assistive Devices: Grab bar or rail Assist level:  (MAX A)  Function - Air cabin crew transfer activity  did not occur:  (using urinal or incont., no BM) Toilet transfer assistive device: Elevated toilet seat/BSC over toilet, Grab bar Mechanical lift: Stedy Assist level to toilet: Touching or steadying assistance (Pt > 75%) Assist level from toilet: Touching or steadying assistance (Pt > 75%)  Function - Chair/bed transfer Chair/bed transfer method: Squat pivot Chair/bed transfer assist level: Touching or steadying assistance (Pt > 75%) Chair/bed transfer assistive device: Armrests Mechanical lift: Stedy Chair/bed transfer details: Tactile cues for sequencing, Tactile cues for weight shifting, Tactile cues for posture, Tactile cues for placement, Verbal cues for technique, Manual facilitation for weight shifting, Manual facilitation for placement  Function - Locomotion: Wheelchair Will patient use wheelchair at discharge?: Yes Type: Manual Max wheelchair distance: 150 ft Assist Level: Supervision or verbal cues Assist Level: Supervision or verbal cues Assist Level: Supervision or verbal cues Turns around,maneuvers to table,bed, and toilet,negotiates 3% grade,maneuvers on rugs and over doorsills: No Function - Locomotion: Ambulation Ambulation activity did not occur: Safety/medical concerns (pt pushes toward hemi side; unsafe) Assistive device: Rail in hallway, Orthosis Max distance: 10 Assist level: 2 helpers Walk 10 feet activity did not occur:  Safety/medical concerns Assist level: 2 helpers Walk 50 feet with 2 turns activity did not occur: Safety/medical concerns Walk 150 feet activity did not occur: Safety/medical concerns Walk 10 feet on uneven surfaces activity did not occur: Safety/medical concerns  Function - Comprehension Comprehension: Auditory Comprehension assist level: Understands basic 90% of the time/cues < 10% of the time  Function - Expression Expression: Verbal Expression assist level: Expresses basic needs/ideas: With no assist  Function - Social  Interaction Social Interaction assist level: Interacts appropriately 90% of the time - Needs monitoring or encouragement for participation or interaction.  Function - Problem Solving Problem solving assist level: Solves basic problems with no assist  Function - Memory Memory assist level: Recognizes or recalls 90% of the time/requires cueing < 10% of the time Patient normally able to recall (first 3 days only): Current season, Location of own room, Staff names and faces, That he or she is in a hospital  Medical Problem List and Plan: 1.  Left hemiplegia, limitations with self-care, difficulty with transfers secondary to right MCA CVA.   Cont CIR PT, OT, speech therapies. Slowly improving with strength and mobility 2.  DVT Prophylaxis/Anticoagulation: Pharmaceutical: Xarelto for PE, expect at least 3 mo tx 3. Pain Management: Left hip/groin pain may be spasms no sig degenerative changes on Xray, may need to adjust zanaflex 4. Mood: Team to provide ego support. Expressing anxiety about current situation. Has supportive family. LCSW to follow for evaluation and support.  5. Neuropsych: This patient is capable of making decisions on his own behalf. 6. Skin/Wound Care: routine pressure relief measures.  7. Fluids/Electrolytes/Nutrition: Monitor I/O. Check lytes in am. Encourage fluid intake.  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 9. T2DM: Hgb A1C- 10.5 --. Continue metformin 1000mg  bid. Was started on lantus  45 units daily at bedtime, with SSI for elevated BS--will consult RD for diabetic education and start education on insulin administration.  Will monitor BS ac/hs,at goal 4/2 10.  Acute/subacute right lower lobe pulmonary embolism. Xarelto,  neurologically stable CBG (last 3)   Recent Labs  09/19/16 1650 09/19/16 2110 09/20/16 0632  GLUCAP 125* 150* 115*    10. HTN: Monitor BP bid--on lisinopril 20mg  daily.stabilizing  in normal range 4/2 Vitals:   09/19/16 1510 09/20/16 0529  BP: 121/74 130/74  Pulse: 79 72  Resp: 18 18  Temp: 97.6 F (36.4 C) 97.7 F (36.5 C)    11. Leucocytosis: Monitor for signs of infection. UA negative. Afebrile,WBC 12.9 12. Polycythemia: Smokes 1.5 PPD. Expect this to remain elevated during hospitalization, On NicoDerm patch 13. COPD: SOB reported--CXR negative. Encourage tobacco cessation.  Managed without medications at this time.   14. Tobacco abuse: Counsel  14. Hypoalbuminemia, start pro-stat 15. Urinary retention,  Resolved  16. Aortic aneursym infrarenal 6.8cm--- Conservative mgt for now---he spoke to son at length. Dr. Donzetta Matters will follow up with patient in office once he's further recovered from CVA  LOS (Days) 26 A FACE TO FACE EVALUATION WAS PERFORMED  Alysia Penna E 09/20/2016, 6:42 AM

## 2016-09-20 NOTE — Progress Notes (Signed)
Physical Therapy Session Note  Patient Details  Name: Chad Combs MRN: [id number] Date of Birth: 07/09/1955  Today's Date: 09/20/2016 PT Individual Time: 1330-1445 PT Individual Time Calculation (min): 75 min   Short Term Goals: Week 4:  PT Short Term Goal 1 (Week 4): STG =LTG due to ELSO  Skilled Therapeutic Interventions/Progress Updates:  Pt presented in w/c agreeable to therapy. Propelled to rehab gym with supervision. Performed squat pivot transfer w/c to mat with minA with pt able to verbalize appropriate sequencing. Performed sit to supine with modA, LTR,hip flexion with therapy ball and bridges with PTA blocking L foot for NMR and trunk activation/coordination. Supine to sit with mod/maxA. Gait at wall with PTA advancing LLE, x 82ft. Pt demonstrating increased quad activation with cues while in stance phase. As pt fatigued increased difficulty advancing RLE equal to LLE and decreased safety awareness with sequencing. Pt ambulated additional 5 ft with similar technique. Pt indicating urgency for BM transported to room, pt performed stand pivot w/c to toilet with use of wall rail with minA. Pt left on toilet verbalizing understanding to use call button and NT aware.      Therapy Documentation Precautions:  Precautions Precautions: Fall Precaution Comments: L sided neglect, shoulder increased risk for subluxation; quick LOB L wihtout awareness Restrictions Weight Bearing Restrictions: No General:   Vital Signs: Therapy Vitals Temp: 98.5 F (36.9 C) Temp Source: Oral Pulse Rate: 87 Resp: 17 BP: 114/66 Patient Position (if appropriate): Sitting Oxygen Therapy SpO2: 98 % O2 Device: Not Delivered Pain: Pain Assessment Pain Assessment: No/denies pain   See Function Navigator for Current Functional Status.   Therapy/Group: Individual Therapy  Chad Combs  Chad Combs, PTA  09/20/2016, 3:57 PM

## 2016-09-20 NOTE — Progress Notes (Signed)
Speech Language Pathology Daily Session Note  Patient Details  Name: PAXTON BINNS MRN: [id number] Date of Birth: 1955/09/20  Today's Date: 09/20/2016 SLP Individual Time: 1300-1330 SLP Individual Time Calculation (min): 30 min  Short Term Goals: Week 4: SLP Short Term Goal 1 (Week 4): Pt will demonstrate sustained attention in moderately distracting environment for ~30 minutes with Min A verbal cues.  SLP Short Term Goal 2 (Week 4): Pt will complete basic, familiar problem solving tasks with Mod A verbal cues.  SLP Short Term Goal 3 (Week 4): Pt will scan to left of his environment to locate items within basic, familiar tasks with Mod A verbal cues.  SLP Short Term Goal 4 (Week 4): Pt will demonstrate anticipatory awareness by identifying 3 activites that are safe to participate in within home environement with Mod A verbal cues.   Skilled Therapeutic Interventions: Skilled treatment session focused on cognition goals. SLP facilitated session by providing Min A verbal cues for completion of checkers in a mildly distracting environment. Pt with many previous attempts to consistently follow directions in checkers with Max A verbal cues. This session, pt with increased ability and only required Min A cues. Pt able to demonstrate sustained attention in mildly distracting environment for ~ 30 minutes with Mod I. Pt was returned to room, left upright in wheelchair, safety belt donned, chair alarm on and all needs within reach. Continue per current plan of care.       Function:    Cognition Comprehension Comprehension assist level: Understands basic 90% of the time/cues < 10% of the time  Expression   Expression assist level: Expresses basic needs/ideas: With no assist  Social Interaction Social Interaction assist level: Interacts appropriately 90% of the time - Needs monitoring or encouragement for participation or interaction.  Problem Solving Problem solving assist level: Solves basic problems  with no assist  Memory Memory assist level: Recognizes or recalls 90% of the time/requires cueing < 10% of the time    Pain Pain Assessment Pain Assessment: No/denies pain  Therapy/Group: Individual Therapy  Jannice Beitzel 09/20/2016, 1:38 PM

## 2016-09-20 NOTE — Progress Notes (Signed)
Occupational Therapy Session Note  Patient Details  Name: Chad Combs MRN: [id number] Date of Birth: 23-Sep-1955  Today's Date: 09/20/2016 OT Individual Time: 1030-1100 OT Individual Time Calculation (min): 30 min    Short Term Goals: Week 1:  OT Short Term Goal 1 (Week 1): Pt will maintain static sitting balance for at least 5 mins with supervision in preparation for selfcare tasks.  OT Short Term Goal 1 - Progress (Week 1): Met OT Short Term Goal 2 (Week 1): Pt will complete UB bathing with min assist in unsupported sitting.  OT Short Term Goal 2 - Progress (Week 1): Met OT Short Term Goal 3 (Week 1): Pt will complete LB bathing with max assist sit to stand.  OT Short Term Goal 3 - Progress (Week 1): Met OT Short Term Goal 4 (Week 1): Pt will donn pullover shirt in supported sitting with min assist 2 consecutive sessions.  OT Short Term Goal 4 - Progress (Week 1): Met OT Short Term Goal 5 (Week 1): Pt will donn pullup pants with max assist sit to stand for 2 consecutive sessions.  OT Short Term Goal 5 - Progress (Week 1): Met Week 2:  OT Short Term Goal 1 (Week 2): Pt will complete stand pivot transfer to the 3:1 with mod assist.  OT Short Term Goal 1 - Progress (Week 2): Partly met (pt only needs mod A, but +2 A for safety due to impulsivity) OT Short Term Goal 2 (Week 2): Pt will complete sit to stand for pulling pants over hips with min assist for 2 consecutive sessions.  OT Short Term Goal 2 - Progress (Week 2): Met OT Short Term Goal 3 (Week 2): Pt will complete UB dressing with supervision for 2 consecutive sessions.   OT Short Term Goal 3 - Progress (Week 2): Met OT Short Term Goal 4 (Week 2): Pt will perform self AAROM exercises for the LUE following handout with min instructional cueing.  OT Short Term Goal 4 - Progress (Week 2): Met Week 3:  OT Short Term Goal 1 (Week 3): Pt will consistently complete transfer to toilet with mod A of 1 person A only. OT Short Term Goal 1 -  Progress (Week 3): Met OT Short Term Goal 2 (Week 3): Pt will be able to pull pants over hips himself with min A to stabilize balance. OT Short Term Goal 2 - Progress (Week 3): Not met OT Short Term Goal 3 (Week 3): Pt will be able to wash R and L leg arm using a long handled sponge with S. OT Short Term Goal 3 - Progress (Week 3): Progressing toward goal OT Short Term Goal 4 (Week 3): Pt will be able to wt shift to L side safely to wash bottom with lateral lean on tub bench with steadying A. OT Short Term Goal 4 - Progress (Week 3): Other (comment) (Not addressed) Week 4:  OT Short Term Goal 1 (Week 4): Pt will complete clothing management during LB dressing/ toileting tasks with min steadying assist OT Short Term Goal 2 (Week 4): Pt will complete squat pivot transfers consistently with min A OT Short Term Goal 3 (Week 4): Pt will complete 1 grooming task in standing with steadying assist  Skilled Therapeutic Interventions/Progress Updates:    per report from earlier OT session, pt was having difficulty with motor planning with squat pivot transfers from toilet ><w/c.   In this session, pt received in w/c stating he was very tired but agreeable  to working on his transfers.   Pt taken to gym, and practiced squat pivot to mat to R with min A. On mat he practiced scooting down length of mat 5x to right and 5x to left for 3 sets with tactile cues for forward lean and verbal cues to fully lift hips off the mat.  He then practiced this technique mat >< w/c 8x.  Initially he was landing in w/c seat in an uncontrolled manner (when transferring to the L) but with cues was able to push his hips back and control the descent more smoothly.  Multiple repetitions for motor memory.  A/arom for L shoulder with minimal sh flex and extension.  Pt taken back to room to rest with quick release belt on and chair alarm on. Call light in reach.  Therapy Documentation Precautions:  Precautions Precautions:  Fall Precaution Comments: L sided neglect, shoulder increased risk for subluxation; quick LOB L wihtout awareness Restrictions Weight Bearing Restrictions: No    Vital Signs: Oxygen Therapy SpO2: 98 % O2 Device: Not Delivered Pain: Pain Assessment Pain Assessment: No/denies pain ADL:    See Function Navigator for Current Functional Status.   Therapy/Group: Individual Therapy  Cacey Willow 09/20/2016, 12:51 PM

## 2016-09-20 NOTE — Progress Notes (Signed)
Occupational Therapy Session Note  Patient Details  Name: Chad Combs MRN: [id number] Date of Birth: 1955/12/27  Today's Date: 09/20/2016 OT Individual Time: 6834-1962 and 11:15-12:00 OT Individual Time Calculation (min): 45 min and 45 min   Short Term Goals: Week 4:  OT Short Term Goal 1 (Week 4): Pt will complete clothing management during LB dressing/ toileting tasks with min steadying assist OT Short Term Goal 2 (Week 4): Pt will complete squat pivot transfers consistently with min A OT Short Term Goal 3 (Week 4): Pt will complete 1 grooming task in standing with steadying assist  Skilled Therapeutic Interventions/Progress Updates:    Session One: Pt seen for OT session focusing on functional transfers and ADL re-training. Pt in supine upon arrival with RN present administering morning medications. Pt voicing 8/10 pain in L hip, receiving pain medications upon arrival.  He transferred to EOB using hospital bed functions, VCs for technique and assist for advancement of L LE. Shoes and AFO donned EOB total A as pt voicing need to get to bathroom. Completed min A squat pivot transfer to w/Combs with VCs for technique. Squat/stand pivot completed to toilet with min-mod A. He stood from toilet with use of grab bar with min A. Pt able to complete hygiene with steadying assist and VCs for L weightshift, assist provided for thorouhness with hygiene. Required multiple attempts for planning squat pivot from toilet to w/Combs as pt attempting to stand prior to transfer. He completed grooming and dressing tasks seated in w/Combs at sink, able to independently recall hemi dressing techniques.  He stood at sink with min A to stand and mod A for standing balance when attempting to pull pants up, assist required to pull pants over L hip.  Pt remained seated in w/Combs at end of session, QRB donned and all needs in reach.   Session Two: Pt seen for OT session focusing on functional transfers and neuro re-ed. Pt sitting up  in w/Combs upon arrival, voicing increased fatigue from previous sessions but agreeable to attempt session. Desired to complete shaving task this session. He used Copy to shave, assist provided for thoroughness on L side. In Therapy day room, pt compete x3 squat pivots w/Combs <> EOM with overall min A and VCs each trial for head/hip relationship and awareness of L LE placement.  Pt stood at high/low table to weight bear through R UE while completing table top game. Pt tolerated ~3-4 minutes of standing, requiring initially min steadying assist progressing to mod with fatigue due to L lean, VCs provided for midline orientation. Table top game set-up to facilitate weight shift to R/LCompleted x2 trials. Pt returned to room at end of session, left seated in w/Combs in prep for lunch with QRB donned and chair alarm activated.   Therapy Documentation Precautions:  Precautions Precautions: Fall Precaution Comments: L sided neglect, shoulder increased risk for subluxation; quick LOB L wihtout awareness Restrictions Weight Bearing Restrictions: No  See Function Navigator for Current Functional Status.   Therapy/Group: Individual Therapy  Chad Combs 09/20/2016, 7:09 AM

## 2016-09-21 ENCOUNTER — Inpatient Hospital Stay (HOSPITAL_COMMUNITY): Payer: Self-pay

## 2016-09-21 ENCOUNTER — Inpatient Hospital Stay (HOSPITAL_COMMUNITY): Payer: Self-pay | Admitting: Speech Pathology

## 2016-09-21 ENCOUNTER — Inpatient Hospital Stay (HOSPITAL_COMMUNITY): Payer: Self-pay | Admitting: Occupational Therapy

## 2016-09-21 LAB — GLUCOSE, CAPILLARY
GLUCOSE-CAPILLARY: 104 mg/dL — AB (ref 65–99)
GLUCOSE-CAPILLARY: 150 mg/dL — AB (ref 65–99)
GLUCOSE-CAPILLARY: 93 mg/dL (ref 65–99)
Glucose-Capillary: 120 mg/dL — ABNORMAL HIGH (ref 65–99)

## 2016-09-21 NOTE — NC FL2 (Signed)
[place] LEVEL OF CARE SCREENING TOOL     IDENTIFICATION  Patient Name: Chad Combs Birthdate: 1955/09/03 Sex: male Admission Date (Current Location): 08/25/2016  [facility] and [place] Number:  Engineering geologist and Address:  [facility] [place] [address] [place], [place] [address]      Provider Number: [id number]  Attending Physician Name and Address:  Charlett Blake, MD  Relative Name and Phone Number:  Esaiah Wanless [phone]    Current Level of Care: Other (Comment) (Rehab) Recommended Level of Care: [place] Prior Approval Number:    Date Approved/Denied:   PASRR Number: [id number]  Discharge Plan: SNF    Current Diagnoses: Patient Active Problem List   Diagnosis Date Noted  . Acute deep vein thrombosis (DVT) of femoral vein of left lower extremity ([place])   . AAA (abdominal aortic aneurysm) ([place])   . Pulmonary embolus ([place])   . Adjustment disorder   . Acute right MCA stroke ([place]) 08/25/2016  . Left hemiplegia ([place])   . Dysarthria, post-stroke   . Dysphagia, post-stroke   . Occlusion and stenosis of carotid artery   . Diabetes mellitus ([place])   . Benign essential HTN   . Leukocytosis   . Polycythemia vera ([place])   . Tobacco abuse   . Chronic obstructive pulmonary disease ([place])   . CVA (cerebral vascular accident) ([place]) 08/21/2016  . Hypertension 07/04/2015  . Diabetes mellitus without complication ([place]) 62/95/2841  . 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 COPD (chronic obstructive pulmonary disease) ([place]) 07/04/2015    Orientation RESPIRATION BLADDER Height & Weight     Self, Place, Situation  Normal Incontinent Weight: 199 lb 14.4 oz (90.7 kg) Height:  _0  (175.3 cm)  BEHAVIORAL SYMPTOMS/MOOD NEUROLOGICAL BOWEL NUTRITION STATUS      Continent Diet (Dys 3 thin liquids-supervision with meals)  AMBULATORY STATUS COMMUNICATION OF NEEDS Skin   Extensive Assist Verbally Normal                       Personal Care Assistance Level of Assistance  Bathing, Feeding, Dressing Bathing Assistance: Limited assistance Feeding assistance: Independent Dressing Assistance: Limited assistance     Functional Limitations Therapist, nutritional, Speech Sight Info: Impaired   Speech Info: Impaired    SPECIAL CARE FACTORS FREQUENCY  PT (By licensed PT), OT (By licensed OT), Speech therapy, Bowel and bladder program     PT Frequency: 5x week OT Frequency: 5x week Bowel and Bladder Program Frequency: Timed toileting for bladder   Speech Therapy Frequency: 5x week      Contractures Contractures Info: Not present    Additional Factors Info  Psychotropic               Current Medications (09/21/2016):  This is the current hospital active medication list Current Facility-Administered Medications  Medication Dose Route Frequency Provider Last Rate Last Dose  . acetaminophen (TYLENOL) tablet 325-650 mg  325-650 mg Oral Q4H PRN Bary Leriche, PA-C   650 mg at 09/20/16 0831  . alum & mag hydroxide-simeth (MAALOX/MYLANTA) 200-200-20 MG/5ML suspension 30 mL  30 mL Oral Q4H PRN Ivan Anchors Love, PA-C   30 mL at 09/19/16 0011  . atorvastatin (LIPITOR) tablet 40 mg  40 mg Oral q1800 Ivan Anchors Love, PA-C   40 mg at 09/20/16 1712  . bisacodyl (DULCOLAX) suppository 10 mg  10 mg Rectal Daily PRN Bary Leriche, PA-C      .  diphenhydrAMINE (BENADRYL) 12.5 MG/5ML elixir 12.5-25 mg  12.5-25 mg Oral Q6H PRN Evlyn Kanner Love, PA-C      . ezetimibe (ZETIA) tablet 10 mg  10 mg Oral q1800 Marvel Plan, MD   10 mg at 09/20/16 1711  . famotidine (PEPCID) tablet 40 mg  40 mg Oral BID Erick Colace, MD   40 mg at 09/21/16 0946  . feeding supplement (PRO-STAT SUGAR FREE 64) liquid 30 mL  30 mL Oral BID Erick Colace, MD   30 mL at 09/20/16 2100  . fluticasone (FLONASE) 50 MCG/ACT nasal spray 1 spray  1 spray Each Nare Daily Jacquelynn Cree, PA-C   1 spray at 09/21/16 [phone]  . gabapentin (NEURONTIN) capsule 100 mg  100 mg  Oral QHS Erick Colace, MD   100 mg at 09/20/16 2206  . guaiFENesin-dextromethorphan (ROBITUSSIN DM) 100-10 MG/5ML syrup 5-10 mL  5-10 mL Oral Q6H PRN Evlyn Kanner Love, PA-C      . insulin aspart (novoLOG) injection 0-20 Units  0-20 Units Subcutaneous TID WC & HS Jacquelynn Cree, PA-C   3 Units at 09/21/16 1320  . insulin glargine (LANTUS) injection 45 Units  45 Units Subcutaneous Daily Erick Colace, MD   45 Units at 09/21/16 [phone]  . insulin starter kit- pen needles (English) 1 kit  1 kit Other Once Erick Colace, MD      . lisinopril (PRINIVIL,ZESTRIL) tablet 20 mg  20 mg Oral Daily Jacquelynn Cree, PA-C   20 mg at 09/21/16 0946  . metFORMIN (GLUCOPHAGE) tablet 1,000 mg  1,000 mg Oral BID WC Evlyn Kanner Love, PA-C   1,000 mg at 09/21/16 0946  . mometasone-formoterol (DULERA) 100-5 MCG/ACT inhaler 2 puff  2 puff Inhalation BID Jacquelynn Cree, PA-C   2 puff at 09/21/16 0754  . nicotine (NICODERM CQ - dosed in mg/24 hours) patch 21 mg  21 mg Transdermal Daily Jacquelynn Cree, PA-C   21 mg at 09/21/16 4290  . polyethylene glycol (MIRALAX / GLYCOLAX) packet 17 g  17 g Oral Daily PRN Jacquelynn Cree, PA-C   17 g at 09/18/16 1751  . prochlorperazine (COMPAZINE) tablet 5-10 mg  5-10 mg Oral Q6H PRN Jacquelynn Cree, PA-C   5 mg at 09/16/16 3795   Or  . prochlorperazine (COMPAZINE) injection 5-10 mg  5-10 mg Intramuscular Q6H PRN Jacquelynn Cree, PA-C       Or  . prochlorperazine (COMPAZINE) suppository 12.5 mg  12.5 mg Rectal Q6H PRN Jacquelynn Cree, PA-C      . Rivaroxaban (XARELTO) tablet 15 mg  15 mg Oral BID WC Erick Colace, MD   15 mg at 09/21/16 0947   Followed by  . [START ON 10/08/2016] rivaroxaban (XARELTO) tablet 20 mg  20 mg Oral Q supper Erick Colace, MD      . senna-docusate (Senokot-S) tablet 1 tablet  1 tablet Oral QHS PRN Jacquelynn Cree, PA-C      . senna-docusate (Senokot-S) tablet 2 tablet  2 tablet Oral QHS Jacquelynn Cree, PA-C   2 tablet at 09/20/16 2208  . sodium phosphate  (FLEET) 7-19 GM/118ML enema 1 enema  1 enema Rectal Once PRN Jacquelynn Cree, PA-C      . tamsulosin (FLOMAX) capsule 0.4 mg  0.4 mg Oral QPC supper Erick Colace, MD   0.4 mg at 09/20/16 1711  . tiZANidine (ZANAFLEX) tablet 2 mg  2 mg Oral QHS Greig Castilla  E Kirsteins, MD   2 mg at 09/20/16 2207  . traZODone (DESYREL) tablet 25-50 mg  25-50 mg Oral QHS PRN Bary Leriche, PA-C   50 mg at 09/17/16 2128     Discharge Medications: Please see discharge summary for a list of discharge medications.  Relevant Imaging Results:  Relevant Lab Results:   Additional Information requires too much care for son to manage at home  Lyan Holck, [name], LCSW

## 2016-09-21 NOTE — Progress Notes (Signed)
Occupational Therapy Session Note  Patient Details  Name: Chad Combs MRN: [id number] Date of Birth: [birth year]  Today's Date: 09/21/2016 OT Individual Time: 7893-8101 OT Individual Time Calculation (min): 75 min    Short Term Goals: Week 4:  OT Short Term Goal 1 (Week 4): Pt will complete clothing management during LB dressing/ toileting tasks with min steadying assist OT Short Term Goal 2 (Week 4): Pt will complete squat pivot transfers consistently with min A OT Short Term Goal 3 (Week 4): Pt will complete 1 grooming task in standing with steadying assist  Skilled Therapeutic Interventions/Progress Updates:    Pt seen for OT ADL bathing/dressing session. Pt in supine upon arrival, agreeable to tx session desiring to eat breakfast prior to shower. He transferred to EOB using hospital bed functions and assist to manage L LE over the EOB.  He sat EOB to eat breakfast, able to attend to all areas of meal tray, however, required mod cuing for following of swallowing pre-cautions per SLP protocal including taking smaller bites and clearing mouth completely before taking next bite.  He completed squat pivot transfers throughout session. Min A when transfering to strong R side, mod A when transfering to L. Physical assist provided to place L LE during functional transfers.  He bathed seated on tub transfer bench, Hand over hand assist to wash R UE. Completed lateral leans for buttock hygiene. He dressed seated in w/c. Assist to obtain and maintain figure 4 sitting position for R LE to thread pants and don socks. Stood at sink with mod A, min A standing balance with B UE support in static standing, mod-max A dyanmic standing when attempting to pull pants up.  Pt left seated in w/c at end of session, QRB donned, chair alarm activated, and all needs in reach.   Therapy Documentation Precautions:  Precautions Precautions: Fall Precaution Comments: L sided neglect, shoulder increased risk for  subluxation; quick LOB L wihtout awareness Restrictions Weight Bearing Restrictions: No   See Function Navigator for Current Functional Status.   Therapy/Group: Individual Therapy  Lewis, Kendall Arnell C 09/21/2016, 7:06 AM

## 2016-09-21 NOTE — Discharge Summary (Signed)
Physician Discharge Summary  Patient ID: Chad Combs MRN: [id number] DOB/AGE: Apr 25, 1956 61 y.o.  Admit date: 08/25/2016 Discharge date: 10/01/2016  Discharge Diagnoses:  Principal Problem:   Acute right MCA stroke [facility]) Active Problems:   Left hemiplegia (HCC)   Dysarthria, post-stroke   Dysphagia, post-stroke   Occlusion and stenosis of carotid artery   Diabetes mellitus (HCC)   Benign essential HTN   Leukocytosis   Polycythemia vera (HCC)   Tobacco abuse   Chronic obstructive pulmonary disease (HCC)   Adjustment disorder   Pulmonary embolus (HCC)   Acute deep vein thrombosis (DVT) of femoral vein of left lower extremity (HCC)   AAA (abdominal aortic aneurysm) without rupture (HCC)   Discharged Condition:  Stable   Significant Diagnostic Studies: Ct Head Wo Contrast  Result Date: 09/15/2016 CLINICAL DATA:  61 y/o  M; follow-up of intracranial hemorrhage. EXAM: CT HEAD WITHOUT CONTRAST TECHNIQUE: Contiguous axial images were obtained from the base of the skull through the vertex without intravenous contrast. COMPARISON:  09/14/2016 CT of the head. FINDINGS: Brain: Stable distribution of right MCA infarction with stable small foci of hemorrhage. Mild local mass effect with partial effacement of the right lateral ventricle. No evidence for new large acute infarction, new intracranial hemorrhage, or new focal mass effect. Vascular: Moderate calcific atherosclerosis of cavernous and paraclinoid internal carotid arteries. Skull: Normal. Negative for fracture or focal lesion. Sinuses/Orbits: Small chronic right lamina papyracea fracture with herniation of extra orbital fat. Small left anterior ethmoid air cell mucous retention cyst. Visualized paranasal sinuses and mastoid air cells are otherwise normally aerated. Orbits are unremarkable. Other: None. IMPRESSION: Stable right MCA distribution infarct and small foci of hemorrhagic conversion. Stable mild local mass effect. No herniation. No  new acute intracranial abnormality identified. Electronically Signed   By: Kristine Garbe M.D.   On: 09/15/2016 03:06   Ct Head Wo Contrast  Result Date: 09/14/2016 CLINICAL DATA:  Follow up cerebrovascular accident. Considering anticoagulation. Assess for hemorrhagic transformation. Initial encounter. EXAM: CT HEAD WITHOUT CONTRAST TECHNIQUE: Contiguous axial images were obtained from the base of the skull through the vertex without intravenous contrast. COMPARISON:  MRI of the brain performed 08/22/2016 FINDINGS: Brain: There has been interval evolution of the large right MCA territory infarct, with multiple new foci of acute intraparenchymal hemorrhage, measuring up to 1 cm in size. Scattered subarachnoid hemorrhage is also noted along the cortical edges of the infarct. Associated edema is noted, without evidence of midline shift at this time. There is no evidence of transtentorial herniation. The infarct involves much of the right basal ganglia. The brainstem and fourth ventricle are within normal limits. Vascular: No hyperdense vessel or unexpected calcification. Skull: There is no evidence of fracture; visualized osseous structures are unremarkable in appearance. Sinuses/Orbits: The orbits are within normal limits. The paranasal sinuses and mastoid air cells are well-aerated. Other: No significant soft tissue abnormalities are seen. IMPRESSION: Interval evolution of large right MCA territory infarct, with multiple new foci of acute intraparenchymal hemorrhage, measuring up to 1 cm in size. Scattered subarachnoid hemorrhage also noted along the cortical edges of the infarct. Associated edema seen, without evidence of midline shift. No evidence of transtentorial herniation. Critical Value/emergent results were called by telephone at the time of interpretation on 09/14/2016 at 4:00 am to Danella Sensing NP, who verbally acknowledged these results. Electronically Signed   By: Garald Balding M.D.   On:  09/14/2016 04:03   US Abdominal Aorta Screening Aaa  Result Date: 09/09/2016 CLINICAL  DATA:  History of stroke, screen for aneurysm EXAM: ULTRASOUND OF ABDOMINAL AORTA TECHNIQUE: Ultrasound examination of the abdominal aorta was performed to evaluate for abdominal aortic aneurysm. COMPARISON:  None. FINDINGS: Abdominal Aorta Diffuse wall irregularity, consistent with atherosclerotic vascular disease. Probable mural thrombus at the mid aortic segment. Maximum Diameter: Proximal aorta:  3.4 cm AP x 2.9 cm transverse Mid aorta:  6.5 cm AP by 5.9 cm transverse Distal aorta:  2.8 cm AP x 3.2 cm transverse. 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 CTA is recommended for further evaluation. Vascular surgery consultation recommended due to increased risk of rupture for AAA >5.5 cm. This recommendation follows ACR consensus guidelines: White Paper of the ACR Incidental Findings Committee II on Vascular Findings. J Am Coll Radiol 2013; 10:789-794. These results will be called to the ordering clinician or representative by the Radiologist Assistant, and communication documented in the PACS or zVision Dashboard. Electronically Signed   By: Donavan Foil M.D.   On: 09/09/2016 22:33   Ct Angio Chest/abd/pel For Dissection W And/or W/wo  Result Date: 09/13/2016 CLINICAL DATA:  Infrarenal abdominal aortic aneurysm on screening study . Hx of:IVC FilterHTN DMCOPD . EXAM: CT ANGIOGRAPHY CHEST, ABDOMEN AND PELVIS TECHNIQUE: Multidetector CT imaging through the chest, abdomen and pelvis was performed using the standard protocol during bolus administration of intravenous contrast. Multiplanar reconstructed images and MIPs were obtained and reviewed to evaluate the vascular anatomy. CONTRAST:  100 mL Isovue 370 IV COMPARISON:  Ultrasound 09/09/2016 FINDINGS: CTA CHEST FINDINGS Cardiovascular:  There is good contrast opacification of pulmonary artery branches. There is a single segmental filling defect in the medial basal segment right lower lobe branch of the pulmonary artery image 88/7 consistent with acute/ subacute PE. There is no dilatation of the right ventricle. Scattered coronary calcifications. There is good contrast opacification of the thoracic aorta with no evidence of dissection or stenosis. Classic 3 vessel brachiocephalic arterial origin anatomy without proximal stenosis. There is a penetrating atheromatous ulcer in the distal aortic arch just beyond the left subclavian origin associated with a saccular 3.1 cm aneurysmal dilatation, containing some irregular mural thrombus. The distal descending thoracic segment is unremarkable. Mediastinum/Nodes: No enlarged mediastinal, hilar, or axillary lymph nodes. Thyroid gland, trachea, and esophagus demonstrate no significant findings. Lungs/Pleura: Emphysematous changes most marked in the apices. No airspace infiltrate. No pleural effusion. No pneumothorax. Musculoskeletal: No chest wall abnormality. No acute or significant osseous findings. Old fracture deformity of the left eleventh rib. Review of the MIP images confirms the above findings. CTA ABDOMEN AND PELVIS FINDINGS VASCULAR Aorta: Scattered atheromatous plaque in the suprarenal segment with mild mural thrombus, no dissection or stenosis. Fusiform aneurysm of the infrarenal aorta extending across the bifurcation measuring 6.8 cm transverse diameter the just above the bifurcation. There is a large amount of mural thrombus within the aneurysm. No retroperitoneal hematoma. Celiac: Proximal short-segment stenosis of the mid level of the median arcuate ligament of the diaphragm, patent distally. SMA: Patent without evidence of aneurysm, dissection, vasculitis or significant stenosis. Renals: Both renal arteries are patent without evidence of aneurysm, dissection, vasculitis, fibromuscular  dysplasia or significant stenosis. IMA: Short segment origin stenosis, reconstituted distally by visceral collaterals. Inflow: Contiguous extension of the aortic aneurysm into the common iliac artery is, extending only a short distance on the left in measure in 3.2 cm  diameter, and extending nearly to the bifurcation of the right common iliac artery, measuring up to 4.2 cm diameter, tapering to a diameter of 19 mm just above the bifurcation. Calcified eccentric plaque through bilateral external iliac artery is resulting in tandem areas of mild stenosis. Visualized proximal arterial outflow unremarkable bilaterally. Veins: Dedicated venous phase imaging not obtained. Infrarenal IVC filter is in expected location. Review of the MIP images confirms the above findings. NON-VASCULAR Hepatobiliary: No focal liver abnormality is seen. No gallstones, gallbladder wall thickening, or biliary dilatation. Pancreas: Unremarkable. No pancreatic ductal dilatation or surrounding inflammatory changes. Spleen: Normal in size without focal abnormality. Adrenals/Urinary Tract: Adrenal glands are unremarkable. Kidneys are normal, without renal calculi, focal lesion, or hydronephrosis. Urinary bladder is physiologically distended There is curvilinear layering high attenuation material along the left posterolateral aspect of the urinary bladder wall. Stomach/Bowel: Stomach mildly distended by ingested material. Small bowel nondilated. Appendix not identified. The colon is nondilated, unremarkable. Lymphatic: No abdominal or pelvic adenopathy localized. Reproductive: Prostatic enlargement protruding into the lumen of the urinary bladder. Other: No ascites.  No free air. Musculoskeletal: Degenerative disc disease L5-S1. Negative for fracture or worrisome bone lesion. Review of the MIP images confirms the above findings. IMPRESSION: 1. Solitary acute PE in the medial basal segment right lower lobe pulmonary artery branch. 2. 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 3. 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 4. Infrarenal retrievable IVC filter. 5. Curvilinear calcifications along the left posterolateral urinary bladder wall may represent small layering calculi versus partially calcified bladder wall lesion. Electronically Signed   By: Lucrezia Europe M.D.   On: 09/10/2016 14:28   Ct Angio Chest/abd/pel For Dissection W And/or W/wo  Result Date: 09/13/2016 CLINICAL DATA:  Infrarenal abdominal aortic aneurysm on screening study . Hx of:IVC FilterHTN DMCOPD . EXAM: CT ANGIOGRAPHY CHEST, ABDOMEN AND PELVIS TECHNIQUE: Multidetector CT imaging through the chest, abdomen and pelvis was performed using the standard protocol during bolus administration of intravenous contrast. Multiplanar reconstructed images and MIPs were obtained and reviewed to evaluate the vascular anatomy. CONTRAST:  100 mL Isovue 370 IV COMPARISON:  Ultrasound 09/09/2016 FINDINGS: CTA CHEST FINDINGS Cardiovascular: There is good contrast opacification of pulmonary artery branches. There is a single segmental filling defect in the medial basal segment right lower lobe branch of the pulmonary artery image 88/7 consistent with acute/ subacute PE. There is no dilatation of the right ventricle. Scattered coronary calcifications. There is good contrast opacification of the thoracic aorta with no evidence of dissection or stenosis. Classic 3 vessel brachiocephalic arterial origin anatomy without proximal stenosis. There is a penetrating atheromatous ulcer in the distal aortic arch just beyond the left subclavian origin associated with a saccular 3.1 cm aneurysmal dilatation, containing some irregular mural thrombus. The distal descending thoracic segment is unremarkable. Mediastinum/Nodes: No enlarged mediastinal, hilar, or  axillary lymph nodes. Thyroid gland, trachea, and esophagus demonstrate no significant findings. Lungs/Pleura: Emphysematous changes most marked in the apices. No airspace infiltrate. No pleural effusion. No pneumothorax. Musculoskeletal: No chest wall abnormality. No acute or significant osseous findings. Old fracture deformity of the left eleventh rib. Review of the MIP images confirms the above findings. CTA ABDOMEN AND PELVIS FINDINGS VASCULAR Aorta: Scattered atheromatous plaque in the suprarenal segment with mild mural thrombus, no dissection or stenosis. Fusiform aneurysm of the infrarenal aorta extending across the  bifurcation measuring 6.8 cm transverse diameter the just above the bifurcation. There is a large amount of mural thrombus within the aneurysm. No retroperitoneal hematoma. Celiac: Proximal short-segment stenosis of the mid level of the median arcuate ligament of the diaphragm, patent distally. SMA: Patent without evidence of aneurysm, dissection, vasculitis or significant stenosis. Renals: Both renal arteries are patent without evidence of aneurysm, dissection, vasculitis, fibromuscular dysplasia or significant stenosis. IMA: Short segment origin stenosis, reconstituted distally by visceral collaterals. Inflow: Contiguous extension of the aortic aneurysm into the common iliac artery is, extending only a short distance on the left in measure in 3.2 cm diameter, and extending nearly to the bifurcation of the right common iliac artery, measuring up to 4.2 cm diameter, tapering to a diameter of 19 mm just above the bifurcation. Calcified eccentric plaque through bilateral external iliac artery is resulting in tandem areas of mild stenosis. Visualized proximal arterial outflow unremarkable bilaterally. Veins: Dedicated venous phase imaging not obtained. Infrarenal IVC filter is in expected location. Review of the MIP images confirms the above findings. NON-VASCULAR Hepatobiliary: No focal liver  abnormality is seen. No gallstones, gallbladder wall thickening, or biliary dilatation. Pancreas: Unremarkable. No pancreatic ductal dilatation or surrounding inflammatory changes. Spleen: Normal in size without focal abnormality. Adrenals/Urinary Tract: Adrenal glands are unremarkable. Kidneys are normal, without renal calculi, focal lesion, or hydronephrosis. Urinary bladder is physiologically distended There is curvilinear layering high attenuation material along the left posterolateral aspect of the urinary bladder wall. Stomach/Bowel: Stomach mildly distended by ingested material. Small bowel nondilated. Appendix not identified. The colon is nondilated, unremarkable. Lymphatic: No abdominal or pelvic adenopathy localized. Reproductive: Prostatic enlargement protruding into the lumen of the urinary bladder. Other: No ascites.  No free air. Musculoskeletal: Degenerative disc disease L5-S1. Negative for fracture or worrisome bone lesion. Review of the MIP images confirms the above findings. IMPRESSION: 1. Solitary acute PE in the medial basal segment right lower lobe pulmonary artery branch. 2. 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 3. 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 4. Infrarenal retrievable IVC filter. 5. Curvilinear calcifications along the left posterolateral urinary bladder wall may represent small layering calculi versus partially calcified bladder wall lesion. Electronically Signed   By: Lucrezia Europe M.D.   On: 09/10/2016 14:28   Dg Hip Unilat With Pelvis 2-3 Views Left  Result Date: 09/09/2016 CLINICAL DATA:  61 year old male with left hip pain. No known injury. EXAM: DG HIP (WITH OR WITHOUT PELVIS) 2-3V LEFT COMPARISON:  None. FINDINGS: Femoral heads are normally located. Hip joint spaces are symmetric and normal. Bone  mineralization is within normal limits for age. The pelvis is intact. Sacral ala and SI joints appear normal. Grossly intact proximal right femur. Proximal left femur is intact and appears normal. There is curvilinear eggshell type calcification projecting over the lower lumbar spine encompassing an area of up to 7 cm (arrows). This is nonspecific but appears to be vascular related. IMPRESSION: 1. Possible rim calcified large infrarenal abdominal aortic aneurysm. Follow-up Aortic Ultrasound might be the simplest way to evaluate further. Otherwise CTA abdomen and pelvis with contrast would be recommended. 2. Otherwise normal radiographic appearance of the left hip and pelvis. 3. These results will be called to the ordering clinician or representative by the Radiologist Assistant, and communication documented in the PACS or zVision Dashboard. Electronically Signed   By:  Genevie Ann M.D.   On: 09/09/2016 14:27    Labs:  Basic Metabolic Panel: BMP Latest Ref Rng & Units 09/22/2016 09/15/2016 09/08/2016  Glucose 65 - 99 mg/dL 86 86 214(H)  BUN 6 - 20 mg/dL 15 16 18   Creatinine 0.61 - 1.24 mg/dL 0.91 0.86 1.07  Sodium 135 - 145 mmol/L 140 137 138  Potassium 3.5 - 5.1 mmol/L 3.7 3.5 3.8  Chloride 101 - 111 mmol/L 104 104 101  CO2 22 - 32 mmol/L 25 24 24   Calcium 8.9 - 10.3 mg/dL 9.3 9.1 9.8    CBC: CBC Latest Ref Rng & Units 09/29/2016 09/22/2016 09/17/2016  WBC 4.0 - 10.5 K/uL 12.1(H) 11.6(H) 9.6  Hemoglobin 13.0 - 17.0 g/dL 15.7 15.9 15.8  Hematocrit 39.0 - 52.0 % 47.7 47.5 46.9  Platelets 150 - 400 K/uL 202 200 191    CBG:  Recent Labs Lab 09/30/16 0631 09/30/16 1203 09/30/16 1659 09/30/16 2207 10/01/16 0639  GLUCAP 117* 155* 94 89 107*    Brief HPI:    Fallou Hulbert Meyeris an 61 y.o.Left handed malewith history of COPD, DM, HTN, CVA with loss of vision right eye,  LLE DVT 11/17 (ran out of Eliquis) who was admitted to [facility] after found by family on 08/21/16 with fall, left sided weakness and  confusion.   CT head done with evolving acute infarct in right parietal and temporal lobes and indeterminate minimally displaced fracture thorough tip of nasal bone. MRI reviewed showing right MCA infarct. MRA brain done revealing occlusion of R-ICA and R-MCA with acute to early subacute large R-MCA infarct. 2 D echo with EF 60% and hypokinesis of anteroseptal myocardium suggestive of CAD.  Carotid dopplers with occlusion of R-ICA with no detectable flow in the neck beyond its origin and significant plaque at level of left carotid bulb and proximal IC causing significant proximal ICA stenosis > 70%.  Dr. Doy Mince evaluated patient and recommended adding Plavix to ASA for stroke prevention and to follow up with vascular for L-ICA stenosis. Follow up dopplers with non-occlusive DVT left CFV and femoral vein and IVC filter placed by radiology. Therapy initiated and patient limited by left sided weakness with right gaze preference, left field cut, slurred speech and dysphagia. CIR was recommended for follow up therapy.    Hospital Course: LAVERN MASLOW was admitted to rehab 08/25/2016 for inpatient therapies to consist of PT, ST and OT at least three hours five days a week. Past admission physiatrist, therapy team and rehab RN have worked together to provide customized collaborative inpatient rehab.  His energy levels have improved and he has been medically stable during his rehab stay.  Follow up labs revealed leucocytosis but no signs of infection noted. Blood pressures were monitored on bid basis and lisinopril was titrated down to prevent hypotension. Blood sugars have been monitored ac/hs basis and he was maintained on metformin and Lantus.  Lantus was titrated upwards with improvement in BS control.  He required in and out cath briefly due to urinary retention. He was started on  urecholine and flomax with improvement in voiding function and urecholine has been discontinued. CXR was ordered due to complaints of  cough and SOB. CXR was negative for acute changes and respiratory status has improved. He has been educated on importance of smoking cessation and nicotine patch has been used during his hospitalization.   He reported left hip pain with increase in activity and X rays done for follow up revealed no fracture or  signs of arthrits but did show possible large infrarenal AAA. Follow up abdominal ultrasound was done 3/22 revealing 6.5 cm aneurysmal dilatation of mid aorta. Dr. Donzetta Matters was consulted for input and recommended CTA chest and pelvis to evaluate full aorta and outpatient follow up if no signs of impending rupture. CTA chest revealed 6.5 cm AAA with large mural thrombus, 3.1 cm saccular aneurysm of distal aortic arch and solitary acute PE in  RLL. Vascular felt that patient was safe to start anticoagulation. Follow up BLE dopplers showed subacute DVT in Left CFV and FV. LUE was negative for DVT. CT head done prior to initiation of anticoagulation and revealed hemorrhagic conversion of R-MCA infarct. Neurology was consulted for input and repeat CT in 24 hours showed stability. Dr. Erlinda Hong recommended IV heparin for PE given small hemorrhagic transformation and to transition to NOAC if neurologically stable in 2-3 days. He has been transitioned to Xarelto and has been tolerating this without signs of bleeding.   Dr. Sima Matas /neuropsychologist has been following patient with education on coping skills to help with adjustment disorder. Po intake has been good and he is continent of bowel and bladder. He was fitted with AFO to help prevent knee instability and he has had improvement in LLE motor control. He continues to have decreased attention to left with hemiplegia and cognitive deficits affecting overall progress. Family is unable to provide significant physical care needed and has elected on SNF for progressive therapy. Bed is available at [facility] and patient was discharged on 10/01/16 in improved  condition.    Rehab course: During patient's stay in rehab weekly team conferences were held to monitor patient's progress, set goals and discuss barriers to discharge. At admission, patient required total assist with mobility and self care tasks. He demonstrated mild oral dysphagia with let pocketing, impulsivity, deficits in attention, problem solving and decreased speech intelligibility due to flaccid dysarthria. He presented with moderate cognitive deficits with MoCA score 14/22.  He has had improvement in activity tolerance, balance, postural control, as well as ability to compensate for deficits. He is has had improvement in functional use LUE  and LLE as well as improved awareness.  He requires supervision for transfers . He is able to propel the wheelchair for 150' with supervision and is showing improvement in awareness of LLE. He is able to ambulate 22' with hemi walker and  Max assist . He is able to attend to tasks for 45 minutes with min cues. He requires mod assist for sequencing and completion of cognitive tasks.    Disposition: Skilled Nursing Facility  Diet: Dysphagia 3--meats finely chopped with extra gravy. Diabetic/heart healthy  Special Instructions: 1. Need to keep blood pressures around 100- 120 range due to AAA. 2. Monitor BS ac/hs --use SSI as needed for elevated BS. 3. Continue Xarelto for treatment of PE.  4. Full supervision with meals. NO STRAWS. Check for pocketing.   Discharge Instructions    Ambulatory referral to Neurology    Complete by:  As directed    Pt will follow up with Dr. Erlinda Hong or Dr. Jaynee Eagles at [facility] in about 2 months, whoever available at that time. Thanks.   Ambulatory referral to Physical Medicine Rehab    Complete by:  As directed    4 weeks post hospital follow up     Allergies as of 10/01/2016   No Known Allergies     Medication List    STOP taking these medications   aspirin 81  MG chewable tablet   clopidogrel 75 MG tablet Commonly known  as:  PLAVIX   gabapentin 100 MG capsule Commonly known as:  NEURONTIN     TAKE these medications   atorvastatin 40 MG tablet Commonly known as:  LIPITOR Take 1 tablet (40 mg total) by mouth daily at 6 PM.   ezetimibe 10 MG tablet Commonly known as:  ZETIA Take 1 tablet (10 mg total) by mouth daily at 6 PM.   famotidine 40 MG tablet Commonly known as:  PEPCID Take 1 tablet (40 mg total) by mouth 2 (two) times daily.   fluticasone 50 MCG/ACT nasal spray Commonly known as:  FLONASE Place 1 spray into both nostrils daily.   Fluticasone-Salmeterol 100-50 MCG/DOSE Aepb Commonly known as:  ADVAIR Inhale 1 puff into the lungs 2 (two) times daily.   insulin glargine 100 UNIT/ML injection Commonly known as:  LANTUS Inject 0.45 mLs (45 Units total) into the skin daily. Start taking on:  10/02/2016   lisinopril 20 MG tablet Commonly known as:  PRINIVIL,ZESTRIL Take 20 mg by mouth daily.   metFORMIN 500 MG tablet Commonly known as:  GLUCOPHAGE Take 1,000 mg by mouth 2 (two) times daily.   nicotine 14 mg/24hr patch Commonly known as:  NICODERM CQ - dosed in mg/24 hours Place 1 patch (14 mg total) onto the skin daily. Start taking on:  10/02/2016   Rivaroxaban 15 MG Tabs tablet Commonly known as:  XARELTO Take 1 tablet (15 mg total) by mouth 2 (two) times daily with a meal. Till 4/19   rivaroxaban 20 MG Tabs tablet Commonly known as:  XARELTO Take 1 tablet (20 mg total) by mouth daily with supper. Start on 10/08/16 Start taking on:  10/08/2016   senna-docusate 8.6-50 MG tablet Commonly known as:  Senokot-S Take 1 tablet by mouth at bedtime.   tamsulosin 0.4 MG Caps capsule Commonly known as:  FLOMAX Take 1 capsule (0.4 mg total) by mouth daily after supper.   tiZANidine 2 MG tablet Commonly known as:  ZANAFLEX Take 1 tablet (2 mg total) by mouth at bedtime.       Contact information for follow-up providers    Charlett Blake, MD Follow up.   Specialty:  Physical  Medicine and Rehabilitation Why:  office will call you with follow up appointment Contact information: [place] [address] [phone]        [facility] Follow up.   Contact information: [place] [address] [phone]        Xu,Jindong, MD. Schedule an appointment as soon as possible for a visit in 6 week(s).   Specialty:  Neurology Contact information: [address] [place] [address] [phone]        Servando Snare, MD. Call in 1 day(s).   Specialties:  Vascular Surgery, Cardiology Why:  for follow up  on AAA in 2 weeks.  Contact information: [address] [place] [address] [phone]            Contact information for after-discharge care    Destination    [facility] Follow up.   Specialty:  [place] information: [address] [place] [phone]                  Signed: Bary Leriche 10/01/2016, 11:24 AM

## 2016-09-21 NOTE — Progress Notes (Signed)
Physical Therapy Session Note  Patient Details  Name: Chad Combs MRN: [id number] Date of Birth: [birth year]  Today's Date: 09/21/2016 PT Individual Time: 1345-1445 PT Individual Time Calculation (min): 60 min   Short Term Goals: Week 4:  PT Short Term Goal 1 (Week 4): STG =LTG due to ELSO  Skilled Therapeutic Interventions/Progress Updates: Pt presented in bed agreeable to therapy. Performed supine to sit with minA with HOB elevated. PTA donned shoes and AFO total assist for time management. Performed squat pivot transfer minA with pt verbalizing sequencing prior. Required min cues for LLE placement prior to transfer. Pt propelled to rehab gym. Performed gait training +2 with PTA blocking L knee and providing facilitation for L quad activation. Pt required max multimodal cues for increasing L wt shifting. Performed standing in mirror for visual feedback of increasing L wt shift. PTA providing manual facilitation of increasing L wt shift with limited carryover. Performed NuStep L2 BLE only for endurance/strengthening/NMR. Squat pivot transfer minA to w/c. Pt propelled back to room with supervision. Performed squat pivot to L w/c to bed with minA. Pt required modA sit to supine requiring cues for using RLE to assist LLE onto bed. Pt able to scoot to [facility] with use of bed rail with supervision. Pt left in bed with alarm on and needs met.      Therapy Documentation Precautions:  Precautions Precautions: Fall Precaution Comments: L sided neglect, shoulder increased risk for subluxation; quick LOB L wihtout awareness Restrictions Weight Bearing Restrictions: No General:   Vital Signs: Therapy Vitals Temp: 98.1 F (36.7 C) Temp Source: Oral Pulse Rate: 85 Resp: 18 BP: 121/71 Patient Position (if appropriate): Lying Oxygen Therapy SpO2: 96 % O2 Device: Not Delivered   See Function Navigator for Current Functional Status.   Therapy/Group: Individual Therapy  [place]  Aidenjames Heckmann, PTA  09/21/2016, 4:18 PM

## 2016-09-21 NOTE — Progress Notes (Signed)
Speech Language Pathology Daily Session Note  Patient Details  Name: Chad Combs MRN: [id number] Date of Birth: 1956/04/24  Today's Date: 09/21/2016 SLP Individual Time: 0930-1030 SLP Individual Time Calculation (min): 60 min  Short Term Goals: Week 4: SLP Short Term Goal 1 (Week 4): Pt will demonstrate sustained attention in moderately distracting environment for ~30 minutes with Min A verbal cues.  SLP Short Term Goal 2 (Week 4): Pt will complete basic, familiar problem solving tasks with Mod A verbal cues.  SLP Short Term Goal 3 (Week 4): Pt will scan to left of his environment to locate items within basic, familiar tasks with Mod A verbal cues.  SLP Short Term Goal 4 (Week 4): Pt will demonstrate anticipatory awareness by identifying 3 activites that are safe to participate in within home environement with Mod A verbal cues.   Skilled Therapeutic Interventions: Skilled treatment session focused on cognition goals. SLP facilitated session by providing Min A for completion of basic, familiar problem solving. Pt able to sustain attention in moderately distracting environment for ~ 60 minutes with Min A verbal cues. Pt required Mod A to scan to the left of his environment. Pt was returned to room, left upright in wheelchair, safety belt donned and all needs within reach. Continue per current plan of care.      Function:  Eating Eating   Modified Consistency Diet: Yes Eating Assist Level: More than reasonable amount of time;Set up assist for   Eating Set Up Assist For: Opening containers;Cutting food Helper [place] on Utensil: Occasionally     Cognition Comprehension Comprehension assist level: Understands basic 90% of the time/cues < 10% of the time  Expression   Expression assist level: Expresses basic needs/ideas: With no assist  Social Interaction Social Interaction assist level: Interacts appropriately 90% of the time - Needs monitoring or encouragement for participation or  interaction.  Problem Solving Problem solving assist level: Solves basic problems with no assist  Memory Memory assist level: Recognizes or recalls 90% of the time/requires cueing < 10% of the time    Pain    Therapy/Group: Individual Therapy   Ipek Westra B. [name], M.S., [place] 09/21/2016, 12:15 PM

## 2016-09-21 NOTE — Progress Notes (Addendum)
Subjective/Complaints:  No issues overnite, sitting independently but pt has to focus on this Eating D#3 thin liq no coughing  ROS: pt denies nausea, vomiting, diarrhea, cough, shortness of breath or chest pain    Objective: Vital Signs: Blood pressure 119/75, pulse 89, temperature 98 F (36.7 C), temperature source Oral, resp. rate 18, height 5\' 9"  (1.753 m), weight 90.7 kg (199 lb 14.4 oz), SpO2 96 %. No results found. Results for orders placed or performed during the hospital encounter of 08/25/16 (from the past 72 hour(s))  Glucose, capillary     Status: None   Collection Time: 09/18/16  6:42 AM  Result Value Ref Range   Glucose-Capillary 93 65 - 99 mg/dL  Glucose, capillary     Status: Abnormal   Collection Time: 09/18/16 11:48 AM  Result Value Ref Range   Glucose-Capillary 140 (H) 65 - 99 mg/dL   Comment 1 Notify RN   Glucose, capillary     Status: None   Collection Time: 09/18/16  4:19 PM  Result Value Ref Range   Glucose-Capillary 87 65 - 99 mg/dL   Comment 1 Notify RN   Glucose, capillary     Status: Abnormal   Collection Time: 09/18/16  8:45 PM  Result Value Ref Range   Glucose-Capillary 126 (H) 65 - 99 mg/dL  Glucose, capillary     Status: Abnormal   Collection Time: 09/19/16  6:26 AM  Result Value Ref Range   Glucose-Capillary 111 (H) 65 - 99 mg/dL  Glucose, capillary     Status: Abnormal   Collection Time: 09/19/16 12:17 PM  Result Value Ref Range   Glucose-Capillary 133 (H) 65 - 99 mg/dL   Comment 1 Notify RN   Glucose, capillary     Status: Abnormal   Collection Time: 09/19/16  4:50 PM  Result Value Ref Range   Glucose-Capillary 125 (H) 65 - 99 mg/dL   Comment 1 Notify RN   Glucose, capillary     Status: Abnormal   Collection Time: 09/19/16  9:10 PM  Result Value Ref Range   Glucose-Capillary 150 (H) 65 - 99 mg/dL  Glucose, capillary     Status: Abnormal   Collection Time: 09/20/16  6:32 AM  Result Value Ref Range   Glucose-Capillary 115 (H) 65 -  99 mg/dL  Glucose, capillary     Status: Abnormal   Collection Time: 09/20/16 11:20 AM  Result Value Ref Range   Glucose-Capillary 142 (H) 65 - 99 mg/dL   Comment 1 Notify RN   Glucose, capillary     Status: Abnormal   Collection Time: 09/20/16  4:30 PM  Result Value Ref Range   Glucose-Capillary 119 (H) 65 - 99 mg/dL   Comment 1 Notify RN   Glucose, capillary     Status: None   Collection Time: 09/20/16  8:52 PM  Result Value Ref Range   Glucose-Capillary 97 65 - 99 mg/dL     HEENT: normal Cardio: RRR Resp: CTA bilaterally GI: BS + Extremity:  No edema Skin:   Intact Neuro:  Flat, Abnormal Sensory Abnormal Motor Motor is 5/5 in the right deltoid, biceps, triceps, grip, hip flexor, knee extensor, ankle dorsiflexor, 2- left biceps and finger flexors, 2 minus, left hip, knee extensor synergy, 0 at the ankle, Abnormal FMC Ataxic/ dec FMC and Dysarthric, increasing LUE extensor tone Musc/Skel:  Other No pain with upper extremity or lower extremity range of motion Gen. no acute distress   Assessment/Plan: 1. Functional deficits secondary to Right  MCA infarct with Left Hemiparesis which require 3+ hours per day of interdisciplinary therapy in a comprehensive inpatient rehab setting. Physiatrist is providing close team supervision and 24 hour management of active medical problems listed below. Physiatrist and rehab team continue to assess barriers to discharge/monitor patient progress toward functional and medical goals. FIM: Function - Bathing Position: Wheelchair/chair at sink Body parts bathed by patient: Left arm, Chest, Abdomen, Front perineal area, Right upper leg, Left upper leg, Right lower leg, Left lower leg Body parts bathed by helper: Right arm, Back, Buttocks Bathing not applicable: Buttocks, Front perineal area Assist Level: Touching or steadying assistance(Pt > 75%)  Function- Upper Body Dressing/Undressing What is the patient wearing?: Pull over shirt/dress Pull  over shirt/dress - Perfomed by patient: Thread/unthread right sleeve, Put head through opening, Pull shirt over trunk, Thread/unthread left sleeve Pull over shirt/dress - Perfomed by helper: Thread/unthread left sleeve Button up shirt - Perfomed by patient: Thread/unthread right sleeve, Thread/unthread left sleeve Button up shirt - Perfomed by helper: Pull shirt around back, Button/unbutton shirt Assist Level: Supervision or verbal cues Function - Lower Body Dressing/Undressing Lower body dressing/undressing activity did not occur:  (did not occur with this clinician today) What is the patient wearing?: Pants, Socks, Shoes, AFO Position: Wheelchair/chair at sink Underwear - Performed by helper: Thread/unthread right underwear leg, Thread/unthread left underwear leg, Pull underwear up/down Pants- Performed by patient: Thread/unthread right pants leg, Thread/unthread left pants leg, Pull pants up/down Pants- Performed by helper: Pull pants up/down Non-skid slipper socks- Performed by patient: Don/doff right sock, Don/doff left sock Non-skid slipper socks- Performed by helper:  (A to stabilize leg in figure 4) Socks - Performed by patient: Don/doff left sock Shoes - Performed by patient: Don/doff right shoe, Don/doff left shoe (elastic laces) Shoes - Performed by helper: Don/doff left shoe AFO - Performed by patient: Don/doff left AFO AFO - Performed by helper: Don/doff left AFO Assist for footwear: Supervision/touching assist Assist for lower body dressing: Touching or steadying assistance (Pt > 75%)  Function - Toileting Toileting activity did not occur: No continent bowel/bladder event Toileting steps completed by patient: Performs perineal hygiene Toileting steps completed by helper: Adjust clothing prior to toileting, Performs perineal hygiene, Adjust clothing after toileting Toileting Assistive Devices: Grab bar or rail Assist level:  (MAX A)  Function - Air cabin crew  transfer activity did not occur:  (using urinal or incont., no BM) Toilet transfer assistive device: Elevated toilet seat/BSC over toilet, Grab bar Mechanical lift: Stedy Assist level to toilet: Touching or steadying assistance (Pt > 75%) Assist level from toilet: Moderate assist (Pt 50 - 74%/lift or lower)  Function - Chair/bed transfer Chair/bed transfer method: Squat pivot Chair/bed transfer assist level: Touching or steadying assistance (Pt > 75%) Chair/bed transfer assistive device: Armrests Mechanical lift: Stedy Chair/bed transfer details: Tactile cues for sequencing, Tactile cues for weight shifting, Tactile cues for posture, Tactile cues for placement, Verbal cues for technique, Manual facilitation for weight shifting, Manual facilitation for placement  Function - Locomotion: Wheelchair Will patient use wheelchair at discharge?: Yes Type: Manual Max wheelchair distance: 150 ft Assist Level: Supervision or verbal cues Assist Level: Supervision or verbal cues Assist Level: Supervision or verbal cues Turns around,maneuvers to table,bed, and toilet,negotiates 3% grade,maneuvers on rugs and over doorsills: No Function - Locomotion: Ambulation Ambulation activity did not occur: Safety/medical concerns (pt pushes toward hemi side; unsafe) Assistive device: Rail in hallway, Orthosis Max distance: 10 Assist level: 2 helpers Walk 10 feet activity did not  occur: Safety/medical concerns Assist level: 2 helpers Walk 50 feet with 2 turns activity did not occur: Safety/medical concerns Walk 150 feet activity did not occur: Safety/medical concerns Walk 10 feet on uneven surfaces activity did not occur: Safety/medical concerns  Function - Comprehension Comprehension: Auditory Comprehension assist level: Understands basic 90% of the time/cues < 10% of the time  Function - Expression Expression: Verbal Expression assist level: Expresses basic needs/ideas: With no assist  Function -  Social Interaction Social Interaction assist level: Interacts appropriately 90% of the time - Needs monitoring or encouragement for participation or interaction.  Function - Problem Solving Problem solving assist level: Solves basic problems with no assist  Function - Memory Memory assist level: Recognizes or recalls 90% of the time/requires cueing < 10% of the time Patient normally able to recall (first 3 days only): Current season, Location of own room, Staff names and faces, That he or she is in a hospital  Medical Problem List and Plan: 1.  Left hemiplegia, limitations with self-care, difficulty with transfers secondary to right MCA CVA.   Cont CIR PT, OT, speech therapies. Team conf in am 2.  DVT Prophylaxis/Anticoagulation: Pharmaceutical: Xarelto for PE, expect at least 3 mo tx 3. Pain Management: Left hip/groin pain may be spasms no sig degenerative changes on Xray, may need to adjust zanaflex 4. Mood: Team to provide ego support.  LCSW to follow for evaluation and support. Appreciate Neuropsych assist 5. Neuropsych: This patient is capable of making decisions on his own behalf. 6. Skin/Wound Care: routine pressure relief measures.  7. Fluids/Electrolytes/Nutrition: Monitor I/O. Check lytes in am. Encourage fluid intake.  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 9. T2DM: Hgb A1C- 10.5 --. Continue metformin 1000mg  bid. Was started on lantus  45 units daily at bedtime, with SSI for elevated BS--will consult RD for diabetic education and start education on insulin administration.  Will monitor BS ac/hs,at goal 4/3 10.  Acute/subacute right lower lobe pulmonary embolism. Xarelto,  neurologically stable CBG (last 3)   Recent Labs  09/20/16 1120 09/20/16 1630 09/20/16 2052  GLUCAP 142* 119* 97    10. HTN: Monitor BP bid--on lisinopril 20mg  daily.stabilizing in normal range 4/2 Vitals:   09/20/16 1305 09/21/16 0453   BP: 114/66 119/75  Pulse: 87 89  Resp: 17 18  Temp: 98.5 F (36.9 C) 98 F (36.7 C)    11. Leucocytosis: Monitor for signs of infection. UA negative. Afebrile,WBC 12.9 12. Polycythemia: Smokes 1.5 PPD. Expect this to remain elevated during hospitalization, On NicoDerm patch 13. COPD: SOB reported--CXR negative. Encourage tobacco cessation.  Managed without medications at this time.   14. Tobacco abuse: Counsel  14. Hypoalbuminemia, start pro-stat  15 Aortic aneursym infrarenal 6.8cm--- Conservative mgt for now---he spoke to son at length. Dr. Donzetta Matters will follow up with patient in office once he's further recovered from CVA  LOS (Days) 46 A FACE TO FACE EVALUATION WAS [place] E 09/21/2016, 6:09 AM

## 2016-09-22 ENCOUNTER — Inpatient Hospital Stay (HOSPITAL_COMMUNITY): Payer: Self-pay | Admitting: Occupational Therapy

## 2016-09-22 ENCOUNTER — Inpatient Hospital Stay (HOSPITAL_COMMUNITY): Payer: Self-pay | Admitting: Speech Pathology

## 2016-09-22 ENCOUNTER — Inpatient Hospital Stay (HOSPITAL_COMMUNITY): Payer: Self-pay | Admitting: Physical Therapy

## 2016-09-22 LAB — COMPREHENSIVE METABOLIC PANEL
ALT: 45 U/L (ref 17–63)
ANION GAP: 11 (ref 5–15)
AST: 30 U/L (ref 15–41)
Albumin: 3.6 g/dL (ref 3.5–5.0)
Alkaline Phosphatase: 65 U/L (ref 38–126)
BUN: 15 mg/dL (ref 6–20)
CHLORIDE: 104 mmol/L (ref 101–111)
CO2: 25 mmol/L (ref 22–32)
Calcium: 9.3 mg/dL (ref 8.9–10.3)
Creatinine, Ser: 0.91 mg/dL (ref 0.61–1.24)
Glucose, Bld: 86 mg/dL (ref 65–99)
POTASSIUM: 3.7 mmol/L (ref 3.5–5.1)
SODIUM: 140 mmol/L (ref 135–145)
Total Bilirubin: 0.8 mg/dL (ref 0.3–1.2)
Total Protein: 6.8 g/dL (ref 6.5–8.1)

## 2016-09-22 LAB — CBC
HCT: 47.5 % (ref 39.0–52.0)
Hemoglobin: 15.9 g/dL (ref 13.0–17.0)
MCH: 32.6 pg (ref 26.0–34.0)
MCHC: 33.5 g/dL (ref 30.0–36.0)
MCV: 97.5 fL (ref 78.0–100.0)
PLATELETS: 200 10*3/uL (ref 150–400)
RBC: 4.87 MIL/uL (ref 4.22–5.81)
RDW: 12.2 % (ref 11.5–15.5)
WBC: 11.6 10*3/uL — ABNORMAL HIGH (ref 4.0–10.5)

## 2016-09-22 LAB — GLUCOSE, CAPILLARY
GLUCOSE-CAPILLARY: 97 mg/dL (ref 65–99)
GLUCOSE-CAPILLARY: 122 mg/dL — AB (ref 65–99)
GLUCOSE-CAPILLARY: 140 mg/dL — AB (ref 65–99)
Glucose-Capillary: 114 mg/dL — ABNORMAL HIGH (ref 65–99)

## 2016-09-22 MED ORDER — SENNOSIDES-DOCUSATE SODIUM 8.6-50 MG PO TABS
1.0000 | ORAL_TABLET | Freq: Every day | ORAL | Status: DC
  Administered 2016-09-22 – 2016-09-30 (×8): 1 via ORAL
  Filled 2016-09-22 (×8): qty 1

## 2016-09-22 MED ORDER — LISINOPRIL 10 MG PO TABS: 10.0000 mg | ORAL_TABLET | Freq: Every day | ORAL | Status: DC

## 2016-09-22 MED ORDER — NICOTINE 14 MG/24HR TD PT24
14.0000 mg | MEDICATED_PATCH | Freq: Every day | TRANSDERMAL | Status: DC
  Administered 2016-09-23 – 2016-10-01 (×8): 14 mg via TRANSDERMAL
  Filled 2016-09-22 (×9): qty 1

## 2016-09-22 MED ORDER — LISINOPRIL 20 MG PO TABS
20.0000 mg | ORAL_TABLET | Freq: Every day | ORAL | Status: DC
  Administered 2016-09-23 – 2016-10-01 (×8): 20 mg via ORAL
  Filled 2016-09-22 (×10): qty 1

## 2016-09-22 NOTE — Progress Notes (Signed)
Subjective/Complaints:  Starting ADL program with occupational therapy. Had some lower extremity pain earlier, but relieved with analgesic  ROS: pt denies nausea, vomiting, diarrhea, cough, shortness of breath or chest pain    Objective: Vital Signs: Blood pressure 118/72, pulse 90, temperature 97.7 F (36.5 C), temperature source Oral, resp. rate 18, height 5' 9"  (1.753 m), weight 90.7 kg (199 lb 14.4 oz), SpO2 98 %. No results found. Results for orders placed or performed during the hospital encounter of 08/25/16 (from the past 72 hour(s))  Glucose, capillary     Status: Abnormal   Collection Time: 09/19/16 12:17 PM  Result Value Ref Range   Glucose-Capillary 133 (H) 65 - 99 mg/dL   Comment 1 Notify RN   Glucose, capillary     Status: Abnormal   Collection Time: 09/19/16  4:50 PM  Result Value Ref Range   Glucose-Capillary 125 (H) 65 - 99 mg/dL   Comment 1 Notify RN   Glucose, capillary     Status: Abnormal   Collection Time: 09/19/16  9:10 PM  Result Value Ref Range   Glucose-Capillary 150 (H) 65 - 99 mg/dL  Glucose, capillary     Status: Abnormal   Collection Time: 09/20/16  6:32 AM  Result Value Ref Range   Glucose-Capillary 115 (H) 65 - 99 mg/dL  Glucose, capillary     Status: Abnormal   Collection Time: 09/20/16 11:20 AM  Result Value Ref Range   Glucose-Capillary 142 (H) 65 - 99 mg/dL   Comment 1 Notify RN   Glucose, capillary     Status: Abnormal   Collection Time: 09/20/16  4:30 PM  Result Value Ref Range   Glucose-Capillary 119 (H) 65 - 99 mg/dL   Comment 1 Notify RN   Glucose, capillary     Status: None   Collection Time: 09/20/16  8:52 PM  Result Value Ref Range   Glucose-Capillary 97 65 - 99 mg/dL  Glucose, capillary     Status: Abnormal   Collection Time: 09/21/16  6:37 AM  Result Value Ref Range   Glucose-Capillary 104 (H) 65 - 99 mg/dL  Glucose, capillary     Status: Abnormal   Collection Time: 09/21/16 11:30 AM  Result Value Ref Range   Glucose-Capillary 150 (H) 65 - 99 mg/dL  Glucose, capillary     Status: None   Collection Time: 09/21/16  4:45 PM  Result Value Ref Range   Glucose-Capillary 93 65 - 99 mg/dL  Glucose, capillary     Status: Abnormal   Collection Time: 09/21/16  8:38 PM  Result Value Ref Range   Glucose-Capillary 120 (H) 65 - 99 mg/dL  Glucose, capillary     Status: None   Collection Time: 09/22/16  6:33 AM  Result Value Ref Range   Glucose-Capillary 97 65 - 99 mg/dL  CBC     Status: Abnormal   Collection Time: 09/22/16  6:34 AM  Result Value Ref Range   WBC 11.6 (H) 4.0 - 10.5 K/uL   RBC 4.87 4.22 - 5.81 MIL/uL   Hemoglobin 15.9 13.0 - 17.0 g/dL   HCT 47.5 39.0 - 52.0 %   MCV 97.5 78.0 - 100.0 fL   MCH 32.6 26.0 - 34.0 pg   MCHC 33.5 30.0 - 36.0 g/dL   RDW 12.2 11.5 - 15.5 %   Platelets 200 150 - 400 K/uL     HEENT: normal Cardio: RRR Resp: CTA bilaterally GI: BS + Extremity:  No edema Skin:   Intact Neuro:  Flat, Abnormal Sensory Abnormal Motor Motor is 5/5 in the right deltoid, biceps, triceps, grip, hip flexor, knee extensor, ankle dorsiflexor, 2- left biceps and finger flexors, 2 minus, left hip, knee extensor synergy, 0 at the ankle, Abnormal FMC Ataxic/ dec FMC and Dysarthric, increasing LUE extensor tone Musc/Skel:  Other No pain with upper extremity or lower extremity range of motion Gen. no acute distress   Assessment/Plan: 1. Functional deficits secondary to Right MCA infarct with Left Hemiparesis which require 3+ hours per day of interdisciplinary therapy in a comprehensive inpatient rehab setting. Physiatrist is providing close team supervision and 24 hour management of active medical problems listed below. Physiatrist and rehab team continue to assess barriers to discharge/monitor patient progress toward functional and medical goals. FIM: Function - Bathing Position: Shower Body parts bathed by patient: Left arm, Chest, Abdomen, Front perineal area, Right upper leg, Left  upper leg, Right arm, Buttocks Body parts bathed by helper: Right lower leg, Left lower leg, Back Bathing not applicable: Buttocks, Front perineal area Assist Level: Touching or steadying assistance(Pt > 75%)  Function- Upper Body Dressing/Undressing What is the patient wearing?: Pull over shirt/dress Pull over shirt/dress - Perfomed by patient: Thread/unthread right sleeve, Put head through opening, Pull shirt over trunk, Thread/unthread left sleeve Pull over shirt/dress - Perfomed by helper: Thread/unthread left sleeve Button up shirt - Perfomed by patient: Thread/unthread right sleeve, Thread/unthread left sleeve Button up shirt - Perfomed by helper: Pull shirt around back, Button/unbutton shirt Assist Level: Touching or steadying assistance(Pt > 75%) Function - Lower Body Dressing/Undressing Lower body dressing/undressing activity did not occur:  (did not occur with this clinician today) What is the patient wearing?: Pants, Non-skid slipper socks Position: Wheelchair/chair at sink Underwear - Performed by helper: Thread/unthread right underwear leg, Thread/unthread left underwear leg, Pull underwear up/down Pants- Performed by patient: Thread/unthread right pants leg, Thread/unthread left pants leg, Pull pants up/down Pants- Performed by helper: Pull pants up/down Non-skid slipper socks- Performed by patient: Don/doff right sock, Don/doff left sock Non-skid slipper socks- Performed by helper:  (A to stabilize leg in figure 4) Socks - Performed by patient: Don/doff left sock Shoes - Performed by patient: Don/doff right shoe, Don/doff left shoe (elastic laces) Shoes - Performed by helper: Don/doff right shoe, Don/doff left shoe AFO - Performed by patient: Don/doff left AFO AFO - Performed by helper: Don/doff left AFO Assist for footwear: Maximal assist Assist for lower body dressing: Touching or steadying assistance (Pt > 75%)  Function - Toileting Toileting activity did not occur: No  continent bowel/bladder event Toileting steps completed by patient: Performs perineal hygiene Toileting steps completed by helper: Adjust clothing prior to toileting, Performs perineal hygiene, Adjust clothing after toileting Toileting Assistive Devices: Grab bar or rail Assist level:  (MAX A)  Function - Air cabin crew transfer activity did not occur:  (using urinal or incont., no BM) Toilet transfer assistive device: Elevated toilet seat/BSC over toilet, Grab bar Mechanical lift: Stedy Assist level to toilet: Touching or steadying assistance (Pt > 75%) Assist level from toilet: Moderate assist (Pt 50 - 74%/lift or lower)  Function - Chair/bed transfer Chair/bed transfer method: Squat pivot Chair/bed transfer assist level: Touching or steadying assistance (Pt > 75%) Chair/bed transfer assistive device: Armrests Mechanical lift: Stedy Chair/bed transfer details: Tactile cues for sequencing, Tactile cues for weight shifting, Tactile cues for posture, Tactile cues for placement, Verbal cues for technique, Manual facilitation for weight shifting, Manual facilitation for placement  Function - Locomotion: Wheelchair Will patient use  wheelchair at discharge?: Yes Type: Manual Max wheelchair distance: 150 ft Assist Level: Supervision or verbal cues Assist Level: Supervision or verbal cues Assist Level: Supervision or verbal cues Turns around,maneuvers to table,bed, and toilet,negotiates 3% grade,maneuvers on rugs and over doorsills: No Function - Locomotion: Ambulation Ambulation activity did not occur: Safety/medical concerns (pt pushes toward hemi side; unsafe) Assistive device: Rail in hallway, Orthosis Max distance: 10 Assist level: 2 helpers Walk 10 feet activity did not occur: Safety/medical concerns Assist level: 2 helpers Walk 50 feet with 2 turns activity did not occur: Safety/medical concerns Walk 150 feet activity did not occur: Safety/medical concerns Walk 10 feet  on uneven surfaces activity did not occur: Safety/medical concerns  Function - Comprehension Comprehension: Auditory Comprehension assist level: Understands basic 90% of the time/cues < 10% of the time  Function - Expression Expression: Verbal Expression assist level: Expresses basic needs/ideas: With no assist  Function - Social Interaction Social Interaction assist level: Interacts appropriately 90% of the time - Needs monitoring or encouragement for participation or interaction.  Function - Problem Solving Problem solving assist level: Solves basic problems with no assist  Function - Memory Memory assist level: Recognizes or recalls 90% of the time/requires cueing < 10% of the time Patient normally able to recall (first 3 days only): Current season, Location of own room, Staff names and faces, That he or she is in a hospital  Medical Problem List and Plan: 1.  Left hemiplegia, limitations with self-care, difficulty with transfers secondary to right MCA CVA.   Cont CIR PT, OT, speech therapies. Team conference today please see physician documentation under team conference tab, met with team face-to-face to discuss problems,progress, and goals. Formulized individual treatment plan based on medical history, underlying problem and comorbidities. 2.  DVT Prophylaxis/Anticoagulation: Pharmaceutical: Xarelto for PE, expect at least 3 mo tx 3. Pain Management: Left hip/groin pain may be spasms no sig degenerative changes on Xray, may need to adjust zanaflex 4. Mood: Team to provide ego support.  LCSW to follow for evaluation and support. Appreciate Neuropsych assist 5. Neuropsych: This patient is capable of making decisions on his own behalf. 6. Skin/Wound Care: routine pressure relief measures.  7. Fluids/Electrolytes/Nutrition: Monitor I/O. Check lytes in am. Encourage fluid intake.  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 9.  T2DM: Hgb A1C- 10.5 --. Continue metformin 1072m bid. Was started on lantus  45 units daily at bedtime, with SSI for elevated BS--will consult RD for diabetic education and start education on insulin administration.  Will monitor BS ac/hs,at goal 4/4 10.  Acute/subacute right lower lobe pulmonary embolism. Xarelto,  neurologically stable CBG (last 3)   Recent Labs  09/21/16 1645 09/21/16 2038 09/22/16 0633  GLUCAP 93 120* 97    10. HTN: Monitor BP bid--on lisinopril 215mdaily.stabilizing in normal range 4/4 Vitals:   09/21/16 1345 09/22/16 0435  BP: 121/71 118/72  Pulse: 85 90  Resp: 18 18  Temp: 98.1 F (36.7 C) 97.7 F (36.5 C)    11. Leucocytosis: Monitor for signs of infection. UA negative. Afebrile,inproving WBC 11.6 12. Polycythemia: Smokes 1.5 PPD. Expect this to remain elevated during hospitalization, On NicoDerm patch 13. COPD: SOB reported--CXR negative. Encourage tobacco cessation.  Managed without medications at this time.   14. Tobacco abuse: Counsel  14. Hypoalbuminemia, start pro-stat  15 Aortic aneursym infrarenal 6.8cm--- Conservative mgt for now---he spoke to son at length. Dr. CaDonzetta Mattersill follow up with  patient in office once he's further recovered from CVA  LOS (Days) [place] E 09/22/2016, 8:26 AM

## 2016-09-22 NOTE — Progress Notes (Signed)
Occupational Therapy Session Note  Patient Details  Name: Chad Combs MRN: [id number] Date of Birth: 06-Jan-1956  Today's Date: 09/22/2016 OT Individual Time: 0102-7253 OT Individual Time Calculation (min): 60 min    Short Term Goals: Week 4:  OT Short Term Goal 1 (Week 4): Pt will complete clothing management during LB dressing/ toileting tasks with min steadying assist OT Short Term Goal 2 (Week 4): Pt will complete squat pivot transfers consistently with min A OT Short Term Goal 3 (Week 4): Pt will complete 1 grooming task in standing with steadying assist  Skilled Therapeutic Interventions/Progress Updates:    Pt seen for OT session focusing on ADL re-training and functional transfers. Pt asleep in supine upon arrival taking increased time toawaken and being mobility. He reported 8/10 "soreness" all over, though was pre-medicated prior to tx session. He transferred to EOB with assist to manage L LE and use of bed rails from flat bed. He donned pants seated EOB with assist to obtain and maintain figure four position to thread pants. He stood with min A to pull pants up with steadying assist and blocking of L knee to prevent buckling. Initially stood without shoes and pt's L ankle noted to be fully inverted upon stand and pt with no awareness of positioning. Therefore returned to sitting and donned shoes and L AFO for safety. He completed min A squat pivot transfers throughout session with increased cues required when completing toilet transfer as pt with difficulty maintaining squat pivot positioning as he attempted to stand during toilet transfer. With min steadying assist, pt abe to manage clothing for toileting task. He required mod A to pivot to weaker L side. Grooming tasks completed standing at sink with assist to block L knee and cues for using mirror to obtain midline standing position. Assist provided for incorportation of L UE during hand hygiene task.  Completed multiple sit <> stands  at sink focusing on weightbearing through L UE/LE. Pt voiced dizziness upon initial stand. Returned to w/c and BP assessed, 117/69. Assessed in standing, 113/59. He tolerated ~1 minute in static standing before returning to sitting position. Pt left seated in w/c at end of session, QRB donned and RN present to assist with breakfast.   Therapy Documentation Precautions:  Precautions Precautions: Fall Precaution Comments: L sided neglect, shoulder increased risk for subluxation; quick LOB L wihtout awareness Restrictions Weight Bearing Restrictions: No  See Function Navigator for Current Functional Status.   Therapy/Group: Individual Therapy  Lewis, Rosielee Corporan C 09/22/2016, 7:10 AM

## 2016-09-22 NOTE — Progress Notes (Signed)
Speech Language Pathology Weekly Progress and Session Note  Patient Details  Name: Chad Combs MRN: [id number] Date of Birth: [birth year]  Beginning of progress report period: September 15, 2016 End of progress report period: September 22, 2016  Today's Date: 09/22/2016 SLP Individual Time: 0900-1000 SLP Individual Time Calculation (min): 60 min  Short Term Goals: Week 4: SLP Short Term Goal 1 (Week 4): Pt will demonstrate sustained attention in moderately distracting environment for ~30 minutes with Min A verbal cues.  SLP Short Term Goal 1 - Progress (Week 4): Met SLP Short Term Goal 2 (Week 4): Pt will complete basic, familiar problem solving tasks with Mod A verbal cues.  SLP Short Term Goal 2 - Progress (Week 4): Met SLP Short Term Goal 3 (Week 4): Pt will scan to left of his environment to locate items within basic, familiar tasks with Mod A verbal cues.  SLP Short Term Goal 3 - Progress (Week 4): Not met SLP Short Term Goal 4 (Week 4): Pt will demonstrate anticipatory awareness by identifying 3 activites that are safe to participate in within home environement with Mod A verbal cues.  SLP Short Term Goal 4 - Progress (Week 4): Not met    New Short Term Goals: Week 5: SLP Short Term Goal 1 (Week 5): Pt will demonstrate sustained attention in moderately distracting environment for ~45 minutes with Min A verbal cues.  SLP Short Term Goal 2 (Week 5): Pt will complete basic, familiar problem solving tasks with Min A verbal cues.  SLP Short Term Goal 3 (Week 5): Pt will scan to left of his environment to locate items within basic, familiar tasks with Mod A verbal cues.   Weekly Progress Updates: Pt continues to make slow steady gains as evidenced by meeting 2 of 4 STGs. Pt with progress in sustained attention and completion of basic familiar problem solving tasks with Mod A verbal cues. Pt continues to require Mod A verbal cues for most tasks especially for left inattention and anticipatory  awareness. Skilled ST is required to address these deficits in order to increase functional independence and reduce caregiver burden. Anticipate that pt will require skilled supervision at discharge and follow up [place] services.      Intensity: Minumum of 1-2 x/day, 30 to 90 minutes Frequency: 3 to 5 out of 7 days Duration/Length of Stay: pending SNF placement Treatment/Interventions: Cognitive remediation/compensation;Cueing hierarchy;Environmental controls;Functional tasks;Internal/external aids;Patient/family education;Speech/Language facilitation;Therapeutic Activities   Daily Session  Skilled Therapeutic Interventions: skilled treatment session focused on cognition goals. SLP facilitated session by providing question cues and more than a reasonable amount of time to sequence 3 to 5 picture cards.  Pt with increased attention to task for ~45 minutes with Mod A to Min A. Pt comments that he is more tired than usual (MD advises that pt has experienced medication change which might cause increased drowsiness). Pt able to state current deficits but unable to state activities that he can participate in within home environment. Pt was returned to room, left upright in wheelchair, safety belt donned, chair alarm on and all needs within reach. Continue per current plan of care.     Function:     Cognition Comprehension Comprehension assist level: Understands basic 75 - 89% of the time/ requires cueing 10 - 24% of the time  Expression   Expression assist level: Expresses basic needs/ideas: With no assist  Social Interaction Social Interaction assist level: Interacts appropriately 90% of the time - Needs monitoring or encouragement for participation  or interaction.  Problem Solving Problem solving assist level: Solves basic problems with no assist  Memory Memory assist level: Recognizes or recalls 90% of the time/requires cueing < 10% of the time   General    Pain    Therapy/Group: Individual  Therapy   Brittani Purdum B. [name], M.S., [place] 09/22/2016, 10:44 AM

## 2016-09-22 NOTE — Progress Notes (Signed)
Physical Therapy Session Note  Patient Details  Name: Chad Combs MRN: [id number] Date of Birth: [birth year]  Today's Date: 09/22/2016 PT Individual Time: 3552-1747     Short Term Goals: Week 4:  PT Short Term Goal 1 (Week 4): STG =LTG due to ELSO  Skilled Therapeutic Interventions/Progress Updates:   Pt received supine in bed and agreeable to PT. Supine>sit transfer with min-mod assist and min cues for safety awareness of the LUE.   Squat pivot transfer training to various surfaces x 6 with min assist throughout treatment with min cudes for set up and safety.    WC mobility in various locaitons throughout hospital x 211ft, 141ft, 197ft x3 min cues for awareness of L side when locating doors in new environment as well as obstacle negotiation.   Gait training in hall x 9ft with max assist from PT with +2 for WC follow. Pt noted to have improved knee stability throughout gait training but continues to require PT to faciliate LLE limb advancement and prevent excessive ER.   Nustep reciprocal movement training and forced use of the LLE x 10 minutes, level 2>3 BLE. Improved quad and glute activation compared to previous treatment sessions.   Toilet transfer with min assist and heavy use of rails in bathroom. Pt able to perform 50% of perineal hygiene while sitting on toilet. PT managed clothing following toileting.   Pt left sitting in WC with call bell in reach.      Therapy Documentation Precautions:  Precautions Precautions: Fall Precaution Comments: L sided neglect, shoulder increased risk for subluxation; quick LOB L wihtout awareness Restrictions Weight Bearing Restrictions: No Pain: 0/10  See Function Navigator for Current Functional Status.   Therapy/Group: Individual Therapy  [name] 09/22/2016, 2:21 PM

## 2016-09-22 NOTE — Patient Care Conference (Signed)
Inpatient RehabilitationTeam Conference and Plan of Care Update Date: 09/22/2016   Time: 10:45 AM    Patient Name: Chad Combs      Medical Record Number: [id number]  Date of Birth: 05-Sep-1955 Sex: Male         Room/Bed: 4W11C/4W11C-01 Payor Info: Payor: /    Admitting Diagnosis: R CVA  Admit Date/Time:  08/25/2016  2:36 PM Admission Comments: No comment available   Primary Diagnosis:  Acute right MCA stroke (HCC) Principal Problem: Acute right MCA stroke [facility])  Patient Active Problem List   Diagnosis Date Noted  . Acute deep vein thrombosis (DVT) of femoral vein of left lower extremity ([place])   . AAA (abdominal aortic aneurysm) ([place])   . Pulmonary embolus ([place])   . Adjustment disorder   . Acute right MCA stroke ([place]) 08/25/2016  . Left hemiplegia ([place])   . Dysarthria, post-stroke   . Dysphagia, post-stroke   . Occlusion and stenosis of carotid artery   . Diabetes mellitus ([place])   . Benign essential HTN   . Leukocytosis   . Polycythemia vera ([place])   . Tobacco abuse   . Chronic obstructive pulmonary disease ([place])   . CVA (cerebral vascular accident) ([place]) 08/21/2016  . Hypertension 07/04/2015  . Diabetes mellitus without complication ([place]) 19/14/7829  . 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 COPD (chronic obstructive pulmonary disease) ([place]) 07/04/2015    Expected Discharge Date: Expected Discharge Date: 09/21/16  Team Members Present: Physician leading conference: Dr. Alysia Penna Social Worker Present: Ovidio Kin, LCSW Nurse Present: Dorien Chihuahua, RN PT Present: Barrie Folk, PT;Rosita Dechalus, PTA OT Present: Napoleon Form, OT SLP Present: Stormy Fabian, SLP PPS Coordinator present : Daiva Nakayama, RN, CRRN     Current Status/Progress Goal Weekly Team Focus  Medical   Pulmonary embolus, asymptomatic, tolerating oral anticoagulants without neurologic decline  Upgrade functional status strength, eventually will need aortic aneurysm repair  Management blood pressure    Bowel/Bladder   Pt continent of bowel and bladder during the day, Incont at night of bladder.  continue progress with continence at night min assist  toliet pt q4 hrs HS.   Swallow/Nutrition/ Hydration             ADL's   Min A UB bathing/dressing; mod A LB dressing; min-mod functional transfers  Set at min A- supervision, will likely downgrade to min overall  ADL re-training; Neuro re-ed; functional transfers   Mobility   gait 33ft maxA, minA transfers L/R, minA w/c mobilty   modA functional transfers, w/c mobilty  seated/standing balance, transfers, w/c mobilty, gait    Communication             Safety/Cognition/ Behavioral Observations  Mod A to Min A for basic problem solving, left inattention and intellectual awareness  Min A  completion of basic problem solving tasks, strategies for left inattention and sustained attention   Pain   denies         Skin   bruising to abodmen. Sacrum pink, blanchable. EPBC  free of skin [place] min assist  assess skin q shift. Apply EPBC      *See Care Plan and progress notes for long and short-term goals.  Barriers to Discharge: Left neglect, severe neurologic deficits with weakness on the left side.    Possible Resolutions to Barriers:  Continue neuromuscular reeducation, rehabilitation program. Will likely need SNF placement    Discharge Planning/Teaching Needs:  Pursuing NH bed, obtaining a LOG and awaiting medical clearance from  MD      Team Discussion:  Progressing slowly in therapies. On oral anticoagulants now. BP on low side due to anneurysm-may be drowsy from this. [place]er-AFO using in therapy. MD feels medically stable for transfer to NH once bed is found. Participating in therapies and continues to be impulsive. More consistent with OT and with his quad control. Possible surgery one month from now for anneurysm  Revisions to Treatment Plan:  NHP   Continued Need for Acute Rehabilitation Level of Care: The patient  requires daily medical management by a physician with specialized training in physical medicine and rehabilitation for the following conditions: Daily direction of a multidisciplinary physical rehabilitation program to ensure safe treatment while eliciting the highest outcome that is of practical value to the patient.: Yes Daily medical management of patient stability for increased activity during participation in an intensive rehabilitation regime.: Yes Daily analysis of laboratory values and/or radiology reports with any subsequent need for medication adjustment of medical intervention for : Neurological problems;Blood pressure problems;Other  Elease Hashimoto 09/22/2016, 3:49 PM

## 2016-09-23 ENCOUNTER — Inpatient Hospital Stay (HOSPITAL_COMMUNITY): Payer: Self-pay | Admitting: Occupational Therapy

## 2016-09-23 ENCOUNTER — Inpatient Hospital Stay (HOSPITAL_COMMUNITY): Payer: Self-pay | Admitting: Speech Pathology

## 2016-09-23 ENCOUNTER — Inpatient Hospital Stay (HOSPITAL_COMMUNITY): Payer: Self-pay | Admitting: Physical Therapy

## 2016-09-23 LAB — GLUCOSE, CAPILLARY
GLUCOSE-CAPILLARY: 99 mg/dL (ref 65–99)
GLUCOSE-CAPILLARY: 115 mg/dL — AB (ref 65–99)
Glucose-Capillary: 108 mg/dL — ABNORMAL HIGH (ref 65–99)
Glucose-Capillary: 117 mg/dL — ABNORMAL HIGH (ref 65–99)

## 2016-09-23 NOTE — Progress Notes (Signed)
Subjective/Complaints:  No issues last noc, lower ext pain improved   ROS: pt denies nausea, vomiting, diarrhea, cough, shortness of breath or chest pain    Objective: Vital Signs: Blood pressure 129/83, pulse 79, temperature 97.7 F (36.5 C), temperature source Oral, resp. rate 18, height _0  (1.753 m), weight 85.5 kg (188 lb 6.4 oz), SpO2 93 %. No results found. Results for orders placed or performed during the hospital encounter of 08/25/16 (from the past 72 hour(s))  Glucose, capillary     Status: Abnormal   Collection Time: 09/20/16  6:32 AM  Result Value Ref Range   Glucose-Capillary 115 (H) 65 - 99 mg/dL  Glucose, capillary     Status: Abnormal   Collection Time: 09/20/16 11:20 AM  Result Value Ref Range   Glucose-Capillary 142 (H) 65 - 99 mg/dL   Comment 1 Notify RN   Glucose, capillary     Status: Abnormal   Collection Time: 09/20/16  4:30 PM  Result Value Ref Range   Glucose-Capillary 119 (H) 65 - 99 mg/dL   Comment 1 Notify RN   Glucose, capillary     Status: None   Collection Time: 09/20/16  8:52 PM  Result Value Ref Range   Glucose-Capillary 97 65 - 99 mg/dL  Glucose, capillary     Status: Abnormal   Collection Time: 09/21/16  6:37 AM  Result Value Ref Range   Glucose-Capillary 104 (H) 65 - 99 mg/dL  Glucose, capillary     Status: Abnormal   Collection Time: 09/21/16 11:30 AM  Result Value Ref Range   Glucose-Capillary 150 (H) 65 - 99 mg/dL  Glucose, capillary     Status: None   Collection Time: 09/21/16  4:45 PM  Result Value Ref Range   Glucose-Capillary 93 65 - 99 mg/dL  Glucose, capillary     Status: Abnormal   Collection Time: 09/21/16  8:38 PM  Result Value Ref Range   Glucose-Capillary 120 (H) 65 - 99 mg/dL  Glucose, capillary     Status: None   Collection Time: 09/22/16  6:33 AM  Result Value Ref Range   Glucose-Capillary 97 65 - 99 mg/dL  CBC     Status: Abnormal   Collection Time: 09/22/16  6:34 AM  Result Value Ref Range   WBC 11.6  (H) 4.0 - 10.5 K/uL   RBC 4.87 4.22 - 5.81 MIL/uL   Hemoglobin 15.9 13.0 - 17.0 g/dL   HCT 47.5 39.0 - 52.0 %   MCV 97.5 78.0 - 100.0 fL   MCH 32.6 26.0 - 34.0 pg   MCHC 33.5 30.0 - 36.0 g/dL   RDW 12.2 11.5 - 15.5 %   Platelets 200 150 - 400 K/uL  Comprehensive metabolic panel     Status: None   Collection Time: 09/22/16  6:34 AM  Result Value Ref Range   Sodium 140 135 - 145 mmol/L   Potassium 3.7 3.5 - 5.1 mmol/L   Chloride 104 101 - 111 mmol/L   CO2 25 22 - 32 mmol/L   Glucose, Bld 86 65 - 99 mg/dL   BUN 15 6 - 20 mg/dL   Creatinine, Ser 0.91 0.61 - 1.24 mg/dL   Calcium 9.3 8.9 - 10.3 mg/dL   Total Protein 6.8 6.5 - 8.1 g/dL   Albumin 3.6 3.5 - 5.0 g/dL   AST 30 15 - 41 U/L   ALT 45 17 - 63 U/L   Alkaline Phosphatase 65 38 - 126 U/L   Total Bilirubin  0.8 0.3 - 1.2 mg/dL   GFR calc non Af Amer >60 >60 mL/min   GFR calc Af Amer >60 >60 mL/min    Comment: (NOTE) The eGFR has been calculated using the CKD EPI equation. This calculation has not been validated in all clinical situations. eGFR's persistently <60 mL/min signify possible Chronic Kidney Disease.    Anion gap 11 5 - 15  Glucose, capillary     Status: Abnormal   Collection Time: 09/22/16 11:40 AM  Result Value Ref Range   Glucose-Capillary 122 (H) 65 - 99 mg/dL  Glucose, capillary     Status: Abnormal   Collection Time: 09/22/16  4:33 PM  Result Value Ref Range   Glucose-Capillary 114 (H) 65 - 99 mg/dL  Glucose, capillary     Status: Abnormal   Collection Time: 09/22/16  9:13 PM  Result Value Ref Range   Glucose-Capillary 140 (H) 65 - 99 mg/dL     HEENT: normal Cardio: RRR Resp: CTA bilaterally GI: BS + Extremity:  No edema Skin:   Intact Neuro:  Flat, Abnormal Sensory Abnormal Motor Motor is 5/5 in the right deltoid, biceps, triceps, grip, hip flexor, knee extensor, ankle dorsiflexor, 2- left biceps and finger flexors, 2 minus, left hip, knee extensor synergy, 0 at the ankle, Abnormal FMC Ataxic/ dec  FMC and Dysarthric, increasing LUE extensor tone Musc/Skel:  Other No pain with upper extremity or lower extremity range of motion Gen. no acute distress   Assessment/Plan: 1. Functional deficits secondary to Right MCA infarct with Left Hemiparesis which require 3+ hours per day of interdisciplinary therapy in a comprehensive inpatient rehab setting. Physiatrist is providing close team supervision and 24 hour management of active medical problems listed below. Physiatrist and rehab team continue to assess barriers to discharge/monitor patient progress toward functional and medical goals. FIM: Function - Bathing Position: Shower Body parts bathed by patient: Left arm, Chest, Abdomen, Front perineal area, Right upper leg, Left upper leg, Right arm, Buttocks Body parts bathed by helper: Right lower leg, Left lower leg, Back Bathing not applicable: Buttocks, Front perineal area Assist Level: Touching or steadying assistance(Pt > 75%)  Function- Upper Body Dressing/Undressing What is the patient wearing?: Pull over shirt/dress Pull over shirt/dress - Perfomed by patient: Thread/unthread right sleeve, Put head through opening, Pull shirt over trunk, Thread/unthread left sleeve Pull over shirt/dress - Perfomed by helper: Thread/unthread left sleeve Button up shirt - Perfomed by patient: Thread/unthread right sleeve, Thread/unthread left sleeve Button up shirt - Perfomed by helper: Pull shirt around back, Button/unbutton shirt Assist Level: Touching or steadying assistance(Pt > 75%) Function - Lower Body Dressing/Undressing Lower body dressing/undressing activity did not occur:  (did not occur with this clinician today) What is the patient wearing?: Pants, Non-skid slipper socks Position: Wheelchair/chair at sink Underwear - Performed by helper: Thread/unthread right underwear leg, Thread/unthread left underwear leg, Pull underwear up/down Pants- Performed by patient: Thread/unthread right pants  leg, Thread/unthread left pants leg, Pull pants up/down Pants- Performed by helper: Pull pants up/down Non-skid slipper socks- Performed by patient: Don/doff right sock, Don/doff left sock Non-skid slipper socks- Performed by helper:  (A to stabilize leg in figure 4) Socks - Performed by patient: Don/doff left sock Shoes - Performed by patient: Don/doff right shoe, Don/doff left shoe (elastic laces) Shoes - Performed by helper: Don/doff right shoe, Don/doff left shoe AFO - Performed by patient: Don/doff left AFO AFO - Performed by helper: Don/doff left AFO Assist for footwear: Maximal assist Assist for lower body  dressing: Touching or steadying assistance (Pt > 75%)  Function - Toileting Toileting activity did not occur: No continent bowel/bladder event Toileting steps completed by patient: Adjust clothing prior to toileting, Performs perineal hygiene, Adjust clothing after toileting Toileting steps completed by helper: Adjust clothing prior to toileting, Performs perineal hygiene, Adjust clothing after toileting Toileting Assistive Devices: Grab bar or rail Assist level: Touching or steadying assistance (Pt.75%)  Function - Toilet Transfers Toilet transfer activity did not occur:  (using urinal or incont., no BM) Toilet transfer assistive device: Elevated toilet seat/BSC over toilet, Grab bar Mechanical lift: Stedy Assist level to toilet: Touching or steadying assistance (Pt > 75%) Assist level from toilet: Moderate assist (Pt 50 - 74%/lift or lower)  Function - Chair/bed transfer Chair/bed transfer method: Squat pivot Chair/bed transfer assist level: Touching or steadying assistance (Pt > 75%) Chair/bed transfer assistive device: Armrests Mechanical lift: Stedy Chair/bed transfer details: Tactile cues for sequencing, Tactile cues for weight shifting, Tactile cues for posture, Tactile cues for placement, Verbal cues for technique, Manual facilitation for weight shifting, Manual  facilitation for placement  Function - Locomotion: Wheelchair Will patient use wheelchair at discharge?: Yes Type: Manual Max wheelchair distance: 150 ft Assist Level: Supervision or verbal cues Assist Level: Supervision or verbal cues Assist Level: Supervision or verbal cues Turns around,maneuvers to table,bed, and toilet,negotiates 3% grade,maneuvers on rugs and over doorsills: No Function - Locomotion: Ambulation Ambulation activity did not occur: Safety/medical concerns (pt pushes toward hemi side; unsafe) Assistive device: Rail in hallway, Orthosis Max distance: 10 Assist level: 2 helpers Walk 10 feet activity did not occur: Safety/medical concerns Assist level: 2 helpers Walk 50 feet with 2 turns activity did not occur: Safety/medical concerns Walk 150 feet activity did not occur: Safety/medical concerns Walk 10 feet on uneven surfaces activity did not occur: Safety/medical concerns  Function - Comprehension Comprehension: Auditory Comprehension assist level: Understands basic 75 - 89% of the time/ requires cueing 10 - 24% of the time  Function - Expression Expression: Verbal Expression assist level: Expresses basic needs/ideas: With no assist  Function - Social Interaction Social Interaction assist level: Interacts appropriately 90% of the time - Needs monitoring or encouragement for participation or interaction.  Function - Problem Solving Problem solving assist level: Solves basic problems with no assist  Function - Memory Memory assist level: Recognizes or recalls 90% of the time/requires cueing < 10% of the time Patient normally able to recall (first 3 days only): Current season, Location of own room, Staff names and faces, That he or she is in a hospital  Medical Problem List and Plan: 1.  Left hemiplegia, limitations with self-care, difficulty with transfers secondary to right MCA CVA.   Cont CIR PT, OT, speech therapies. 2.  DVT Prophylaxis/Anticoagulation:  Pharmaceutical: Xarelto for PE, expect at least 3 mo tx 3. Pain Management: Left hip/groin pain may be spasms no sig degenerative changes on Xray, may need to adjust zanaflex 4. Mood: Team to provide ego support.  LCSW to follow for evaluation and support. Appreciate Neuropsych assist, pt has been more blunted recently per staff, multiple new major medical issues that he is dealing with, neuropsych f/u, ?SSRI 5. Neuropsych: This patient is capable of making decisions on his own behalf. 6. Skin/Wound Care: routine pressure relief measures.  7. Fluids/Electrolytes/Nutrition: Monitor I/O. Check lytes in am. Encourage fluid intake.  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 9. T2DM: Hgb A1C- 10.5 --. Continue  metformin 1052m bid. Was started on lantus  45 units daily at bedtime, with SSI for elevated BS--will consult RD for diabetic education and start education on insulin administration.  Will monitor BS ac/hs,at goal 4/4 10.  Acute/subacute right lower lobe pulmonary embolism. Xarelto,  neurologically stable CBG (last 3)   Recent Labs  09/22/16 1140 09/22/16 1633 09/22/16 2113  GLUCAP 122* 114* 140*    10. HTN: Monitor BP bid--on lisinopril 236mdaily.stabilizing in normal range 4/5 Vitals:   09/22/16 2022 09/23/16 0527  BP:  129/83  Pulse: 67 79  Resp: 18 18  Temp:  97.7 F (36.5 C)    11. Leucocytosis: Monitor for signs of infection. UA negative. Afebrile,inproving WBC 11.6 12. Polycythemia: Smokes 1.5 PPD. Expect this to remain elevated during hospitalization, On NicoDerm patch 13. COPD: SOB reported--CXR negative. Encourage tobacco cessation.  Managed without medications at this time.   14. Tobacco abuse: Counsel  14. Hypoalbuminemia, start pro-stat  15 Aortic aneursym infrarenal 6.8cm---will keep sys BPs on lower side 100-120s   Conservative mgt for now---. Dr. CaDonzetta Mattersill follow up with patient in office once he's  further recovered from CVA  LOS (Days) 29 A [place] TO [place] 09/23/2016, 5:52 AM

## 2016-09-23 NOTE — Progress Notes (Signed)
Speech Language Pathology Daily Session Note  Patient Details  Name: Chad Combs MRN: [id number] Date of Birth: January 01, 1956  Today's Date: 09/23/2016 SLP Individual Time: 0800-0900 SLP Individual Time Calculation (min): 60 min  Short Term Goals: Week 5: SLP Short Term Goal 1 (Week 5): Pt will demonstrate sustained attention in moderately distracting environment for ~45 minutes with Min A verbal cues.  SLP Short Term Goal 2 (Week 5): Pt will complete basic, familiar problem solving tasks with Min A verbal cues.  SLP Short Term Goal 3 (Week 5): Pt will scan to left of his environment to locate items within basic, familiar tasks with Mod A verbal cues.   Skilled Therapeutic Interventions: Skilled treatment session focused on cognition goals. SLP facilitated session by providing supervision cues for sustained attention in moderately distracting environment for ~ 60 minutes. Pt completed basic, familiar problem solving tasks with Min A verbal cues and was able to scan to his left during functional ADL tasks with Min A cues. Pt was left upright in bed, bed alarm on and all needs within reach. Continue per current plan of care.      Function:    Cognition Comprehension Comprehension assist level: Understands basic 75 - 89% of the time/ requires cueing 10 - 24% of the time  Expression   Expression assist level: Expresses basic needs/ideas: With no assist  Social Interaction Social Interaction assist level: Interacts appropriately 90% of the time - Needs monitoring or encouragement for participation or interaction.  Problem Solving Problem solving assist level: Solves basic problems with no assist  Memory Memory assist level: Recognizes or recalls 90% of the time/requires cueing < 10% of the time    Pain    Therapy/Group: Individual Therapy  Chad Combs 09/23/2016, 8:47 AM

## 2016-09-23 NOTE — Progress Notes (Addendum)
Occupational Therapy Session Note  Patient Details  Name: Chad Combs MRN: [id number] Date of Birth: 17-Feb-1956  Today's Date: 09/23/2016 OT Individual Time: 1000-1100 OT Individual Time Calculation (min): 60 min    Short Term Goals: Week 4:  OT Short Term Goal 1 (Week 4): Pt will complete clothing management during LB dressing/ toileting tasks with min steadying assist OT Short Term Goal 2 (Week 4): Pt will complete squat pivot transfers consistently with min A OT Short Term Goal 3 (Week 4): Pt will complete 1 grooming task in standing with steadying assist  Skilled Therapeutic Interventions/Progress Updates:    Pt seen for OT session focusing on functional transfers, ADL re-training, and neuro re-ed. Pt asleep in supine upon arrival requiring increased time and encouragement for participation.  He transferred to EOB with min A for L LE and heavy reliance on bed rails. He voiced need for toileting task. Completed functional transfers throughout session with min A and VCs for hand placement and head/hip relationship.  Upon transfer to toilet pt voiced complaints of dizziness, BP 96/56, symptoms deminshed following rest break. He stood from toilet with steadying assist and mod steadying assist required for dynamic standing balance while pt completed toileting tasks with VCs for weightshift.  He stood at sink to complete hand hygiene with assist for management of L UE. In therapy gym, completed supine AAROM for L UE. Pt with trace shoulder flexion in gravity eliminated position and remains a Brunstrom level II. Pt returned to room at end of session, left seated with all needs in reach, QRB donned and chair alarm on.   Therapy Documentation Precautions:  Precautions Precautions: Fall Precaution Comments: L sided neglect, shoulder increased risk for subluxation; quick LOB L wihtout awareness Restrictions Weight Bearing Restrictions: No Pain:   No/ denies pain  See Function Navigator for  Current Functional Status.   Therapy/Group: Individual Therapy  Lewis, Cassadi Purdie C 09/23/2016, 7:12 AM

## 2016-09-23 NOTE — Progress Notes (Signed)
Social Work Patient ID: Chad Combs, male   DOB: 1955-11-03, 61 y.o.   MRN: [id number]  Spoke with Luana via telephone and spoke with pt regarding team conference progressing in therapies and now MD feels he is Medically stable to move to the next venue. Son and pt aware NH search could mean he is placed 50 miles away, hopeful, he will be close to [place], his home. Fl2 sent out and awaiting interest.

## 2016-09-23 NOTE — Progress Notes (Signed)
Physical Therapy Session Note  Patient Details  Name: Chad Combs MRN: [id number] Date of Birth: March 25, 1956  Today's Date: 09/23/2016 PT Individual Time: 1400-1530 PT Individual Time Calculation (min): 90 min   Short Term Goals: Week 3:  PT Short Term Goal 1 (Week 3): pt will transfer to R squat pivot with supervision 75% of trials PT Short Term Goal 1 - Progress (Week 3): Progressing toward goal PT Short Term Goal 2 (Week 3): pt will perform gait with LRAD (not railing) x 20' with max assist PT Short Term Goal 2 - Progress (Week 3): Not met PT Short Term Goal 3 (Week 3): pt will maintain standing balance during functional activity x 5 minutes with mod assist. PT Short Term Goal 3 - Progress (Week 3): Met  Skilled Therapeutic Interventions/Progress Updates:   Pt received supine in bed and agreeable to PT. Supine>sit transfer with min assist and min cues for LLE management    Blocked practice squat pivot transfer training with min assist progressing to supervision assist with mod cues for proper set up to improved use of LLE and increased safety of transfer.   WC mobility training instructed by PT with supervision assist from PT. Pt continues to have poor awareness of the L visual field hitting 2 door ways on the L side with decreased cues from PT for planned failure from PT.   LUE Neuromotor re-education in shoulder flexion/extension in gravity eliminated position. Only mild scapular activation noted in reaching as well as minimal anterior shoulder girdle activtion  Toilet transfer following incontinent bladder episode. Clothing management and perineal hygiene performed with mod assist from .   Gait with HW x 5 ft with max assist from PT for LLE limb advancement. Pt able to .   Pt returned to room and performed squat pivot transfer to bed with supervision. Sit>supine completed with min assist and left supine in bed with call bell in reach and all needs met.         Therapy  Documentation Precautions:  Precautions Precautions: Fall Precaution Comments: L sided neglect, shoulder increased risk for subluxation; quick LOB L wihtout awareness Restrictions Weight Bearing Restrictions: No Vital Signs: Oxygen Therapy SpO2: 96 % O2 Flow Rate (L/min): 3 L/min Pain: 0/10   See Function Navigator for Current Functional Status.   Therapy/Group: Individual Therapy  [name] 09/23/2016, 10:42 PM

## 2016-09-24 ENCOUNTER — Inpatient Hospital Stay (HOSPITAL_COMMUNITY): Payer: Self-pay | Admitting: Occupational Therapy

## 2016-09-24 ENCOUNTER — Inpatient Hospital Stay (HOSPITAL_COMMUNITY): Payer: Self-pay | Admitting: Physical Therapy

## 2016-09-24 ENCOUNTER — Inpatient Hospital Stay (HOSPITAL_COMMUNITY): Payer: Self-pay | Admitting: Speech Pathology

## 2016-09-24 LAB — GLUCOSE, CAPILLARY
GLUCOSE-CAPILLARY: 147 mg/dL — AB (ref 65–99)
GLUCOSE-CAPILLARY: 133 mg/dL — AB (ref 65–99)
Glucose-Capillary: 113 mg/dL — ABNORMAL HIGH (ref 65–99)
Glucose-Capillary: 90 mg/dL (ref 65–99)

## 2016-09-24 NOTE — Progress Notes (Signed)
Speech Language Pathology Daily Session Note  Patient Details  Name: Chad Combs MRN: [id number] Date of Birth: [birth year]  Today's Date: 09/24/2016 SLP Individual Time: 1400-1500 SLP Individual Time Calculation (min): 60 min  Short Term Goals: Week 5: SLP Short Term Goal 1 (Week 5): Pt will demonstrate sustained attention in moderately distracting environment for ~45 minutes with Min A verbal cues.  SLP Short Term Goal 2 (Week 5): Pt will complete basic, familiar problem solving tasks with Min A verbal cues.  SLP Short Term Goal 3 (Week 5): Pt will scan to left of his environment to locate items within basic, familiar tasks with Mod A verbal cues.   Skilled Therapeutic Interventions: Skilled treatment session focused on cognition goals. SLP facilitated session by providing supervision cues to complete basic, familiar problem solving tasks. Pt able to sustained attention for ~ 50 minutes with Min A verbal cues. Pt was left upright in bed, bed alarm on and all needs within reach.       Function:  Eating Eating   Modified Consistency Diet: Yes Eating Assist Level: More than reasonable amount of time;Set up assist for;Supervision or verbal cues   Eating Set Up Assist For: Opening containers;Cutting food Helper [place] on Utensil: Occasionally     Cognition Comprehension Comprehension assist level: Understands basic 75 - 89% of the time/ requires cueing 10 - 24% of the time  Expression   Expression assist level: Expresses basic needs/ideas: With no assist  Social Interaction Social Interaction assist level: Interacts appropriately 90% of the time - Needs monitoring or encouragement for participation or interaction.  Problem Solving Problem solving assist level: Solves basic problems with no assist  Memory Memory assist level: Recognizes or recalls 90% of the time/requires cueing < 10% of the time    Pain    Therapy/Group: Individual Therapy   Nolyn Swab B. [name], M.S.,  [place] 09/24/2016, 3:04 PM

## 2016-09-24 NOTE — Progress Notes (Signed)
Social Work Patient ID: Chad Combs, male   DOB: June 06, 1956, 61 y.o.   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 Son working on spend down and meeting with [facility]. Spoke with Keane Scrape counselor who reports son needs to go to [place] [facility] to apply for Medicaid for Dad. Have contacted son to make aware of this and am awaiting return call back.

## 2016-09-24 NOTE — Progress Notes (Signed)
Occupational Therapy Weekly Progress Note  Patient Details  Name: Chad Combs MRN: [id number] Date of Birth: 29-Feb-1956  Beginning of progress report period: September 17, 2016 End of progress report period: September 24, 2016  Today's Date: 09/24/2016  OT Individual Time: 9458-5929 and 1300-1330 OT Individual Time Calculation (min): 75 min and 30 min   Patient has met 3 of 3 short term goals.  Pt making steady progress towards OT goals. He cont to be most limited by no functional movement in L UE/LE. He currently requires min A for static standing balance, however, can required up to mod-max A for some dynamic standing due to L lean. Pt will cont to benefit from skilled OT services to decreased caregiver burden and increase independence with ADL tasks.   Patient continues to demonstrate the following deficits:abnormal posture, cognitive deficits, disturbance of vision, flaccid hemiplegia and hemiparesis and muscle weakness (generalized)  and therefore will continue to benefit from skilled OT intervention to enhance overall performance with BADL and Reduce care partner burden.  Patient progressing toward long term goals..  Continue plan of care.  OT Short Term Goals Week 4:  OT Short Term Goal 1 (Week 4): Pt will complete clothing management during LB dressing/ toileting tasks with min steadying assist OT Short Term Goal 1 - Progress (Week 4): Met OT Short Term Goal 2 (Week 4): Pt will complete squat pivot transfers consistently with min A OT Short Term Goal 2 - Progress (Week 4): Met OT Short Term Goal 3 (Week 4): Pt will complete 1 grooming task in standing with steadying assist OT Short Term Goal 3 - Progress (Week 4): Met Week 5:  OT Short Term Goal 1 (Week 5): STG=LTG due to awaiting SNF placement  Skilled Therapeutic Interventions/Progress Updates:    Session One: Pt seen for OT ADL bathing/dressing session. Pt sitting upright in bed upon arrival with NT present provding full supervision for  meal. Pt agreeable to tx session, only complaints are of fatigue.  He transferred to EOB with min A using hosptial bed functions, able to advance L LE with R LE to EOB. CGA provided for squat pivot into w/c with VCs for technique, increased assist required for toilet and shower transfers. Assist required for clothing management during toielting task, pt able to compelte hygiene from seated level.  He bathed seated on tub bench with assist for R UE. Steadying assist required for lateral leans to complete buttock hygiene and when pt coughed in order to keep balance on tub bench. He required increased time and assist for transfer out of shower to w/c and for posterior scoot back into w/c.  He dressed seated in w/c at sink, recalling hemi- dressing techniques independently, however, required assist for UE clothing management and threading of L LE.  Grooming completed from w/c level at sink with min A for set-up. Pt left seated in w/c at end of session, all needs in reach and QRB donned, chair alarm activated.   Session Two: Pt seen for OT session focusing on UE strengthening/ ROM with neuro re-ed to L UE. Pt asleep in supine upon arrival requiring increased time and cues to arouse. Pt voicing increased fatigue from previous sessions. Pt agreeable to bed level UE ROM, declining OOB and wanting to eat lunch.  Completed supine level AAROM in all planes. Pt noted to have wrist flexion gravity assisted with increased time. Trace movements in hands noted.  He completed self ROM overhead, and to R/L using B UEs.  Pt required mod cuing throughout session to remain focused on task as pt easily distracted by external and internal stimuli.  Pt left in supine at end of session, all needs in reach and bed alarm on.   Therapy Documentation Precautions:  Precautions Precautions: Fall Precaution Comments: L sided neglect, shoulder increased risk for subluxation; quick LOB L wihtout awareness Restrictions Weight  Bearing Restrictions: No Pain:   No/ denies pain  See Function Navigator for Current Functional Status.   Therapy/Group: Individual Therapy  Lewis, Doniel Maiello C 09/24/2016, 7:09 AM

## 2016-09-24 NOTE — Progress Notes (Signed)
Subjective/Complaints:  No issues overnite   ROS: pt denies nausea, vomiting, diarrhea, cough, shortness of breath or chest pain    Objective: Vital Signs: Blood pressure 115/72, pulse 80, temperature 98.6 F (37 C), temperature source Oral, resp. rate 18, height 5' 9" (1.753 m), weight 85.5 kg (188 lb 6.4 oz), SpO2 91 %. No results found. Results for orders placed or performed during the hospital encounter of 08/25/16 (from the past 72 hour(s))  Glucose, capillary     Status: Abnormal   Collection Time: 09/21/16 11:30 AM  Result Value Ref Range   Glucose-Capillary 150 (H) 65 - 99 mg/dL  Glucose, capillary     Status: None   Collection Time: 09/21/16  4:45 PM  Result Value Ref Range   Glucose-Capillary 93 65 - 99 mg/dL  Glucose, capillary     Status: Abnormal   Collection Time: 09/21/16  8:38 PM  Result Value Ref Range   Glucose-Capillary 120 (H) 65 - 99 mg/dL  Glucose, capillary     Status: None   Collection Time: 09/22/16  6:33 AM  Result Value Ref Range   Glucose-Capillary 97 65 - 99 mg/dL  CBC     Status: Abnormal   Collection Time: 09/22/16  6:34 AM  Result Value Ref Range   WBC 11.6 (H) 4.0 - 10.5 K/uL   RBC 4.87 4.22 - 5.81 MIL/uL   Hemoglobin 15.9 13.0 - 17.0 g/dL   HCT 47.5 39.0 - 52.0 %   MCV 97.5 78.0 - 100.0 fL   MCH 32.6 26.0 - 34.0 pg   MCHC 33.5 30.0 - 36.0 g/dL   RDW 12.2 11.5 - 15.5 %   Platelets 200 150 - 400 K/uL  Comprehensive metabolic panel     Status: None   Collection Time: 09/22/16  6:34 AM  Result Value Ref Range   Sodium 140 135 - 145 mmol/L   Potassium 3.7 3.5 - 5.1 mmol/L   Chloride 104 101 - 111 mmol/L   CO2 25 22 - 32 mmol/L   Glucose, Bld 86 65 - 99 mg/dL   BUN 15 6 - 20 mg/dL   Creatinine, Ser 0.91 0.61 - 1.24 mg/dL   Calcium 9.3 8.9 - 10.3 mg/dL   Total Protein 6.8 6.5 - 8.1 g/dL   Albumin 3.6 3.5 - 5.0 g/dL   AST 30 15 - 41 U/L   ALT 45 17 - 63 U/L   Alkaline Phosphatase 65 38 - 126 U/L   Total Bilirubin 0.8 0.3 - 1.2  mg/dL   GFR calc non Af Amer >60 >60 mL/min   GFR calc Af Amer >60 >60 mL/min    Comment: (NOTE) The eGFR has been calculated using the CKD EPI equation. This calculation has not been validated in all clinical situations. eGFR's persistently <60 mL/min signify possible Chronic Kidney Disease.    Anion gap 11 5 - 15  Glucose, capillary     Status: Abnormal   Collection Time: 09/22/16 11:40 AM  Result Value Ref Range   Glucose-Capillary 122 (H) 65 - 99 mg/dL  Glucose, capillary     Status: Abnormal   Collection Time: 09/22/16  4:33 PM  Result Value Ref Range   Glucose-Capillary 114 (H) 65 - 99 mg/dL  Glucose, capillary     Status: Abnormal   Collection Time: 09/22/16  9:13 PM  Result Value Ref Range   Glucose-Capillary 140 (H) 65 - 99 mg/dL  Glucose, capillary     Status: Abnormal   Collection Time:  09/23/16  6:22 AM  Result Value Ref Range   Glucose-Capillary 108 (H) 65 - 99 mg/dL  Glucose, capillary     Status: Abnormal   Collection Time: 09/23/16 11:33 AM  Result Value Ref Range   Glucose-Capillary 117 (H) 65 - 99 mg/dL   Comment 1 Notify RN   Glucose, capillary     Status: None   Collection Time: 09/23/16  5:19 PM  Result Value Ref Range   Glucose-Capillary 99 65 - 99 mg/dL   Comment 1 Notify RN   Glucose, capillary     Status: Abnormal   Collection Time: 09/23/16  8:53 PM  Result Value Ref Range   Glucose-Capillary 115 (H) 65 - 99 mg/dL  Glucose, capillary     Status: Abnormal   Collection Time: 09/24/16  6:40 AM  Result Value Ref Range   Glucose-Capillary 113 (H) 65 - 99 mg/dL     HEENT: normal Cardio: RRR Resp: CTA bilaterally GI: BS + Extremity:  No edema Skin:   Intact Neuro:  Flat, Abnormal Sensory Abnormal Motor Motor is 5/5 in the right deltoid, biceps, triceps, grip, hip flexor, knee extensor, ankle dorsiflexor, 2- left biceps and finger flexors, 2 minus, left hip, knee extensor synergy, 0 at the ankle, Abnormal FMC Ataxic/ dec FMC and Dysarthric,  increasing LUE extensor tone Musc/Skel:  Other No pain with upper extremity or lower extremity range of motion Gen. no acute distress   Assessment/Plan: 1. Functional deficits secondary to Right MCA infarct with Left Hemiparesis which require 3+ hours per day of interdisciplinary therapy in a comprehensive inpatient rehab setting. Physiatrist is providing close team supervision and 24 hour management of active medical problems listed below. Physiatrist and rehab team continue to assess barriers to discharge/monitor patient progress toward functional and medical goals. FIM: Function - Bathing Position: Shower Body parts bathed by patient: Left arm, Chest, Abdomen, Front perineal area, Right upper leg, Left upper leg, Right arm, Buttocks Body parts bathed by helper: Right lower leg, Left lower leg, Back Bathing not applicable: Buttocks, Front perineal area Assist Level: Touching or steadying assistance(Pt > 75%)  Function- Upper Body Dressing/Undressing What is the patient wearing?: Pull over shirt/dress Pull over shirt/dress - Perfomed by patient: Thread/unthread right sleeve, Put head through opening, Pull shirt over trunk, Thread/unthread left sleeve Pull over shirt/dress - Perfomed by helper: Thread/unthread left sleeve Button up shirt - Perfomed by patient: Thread/unthread right sleeve, Thread/unthread left sleeve Button up shirt - Perfomed by helper: Pull shirt around back, Button/unbutton shirt Assist Level: Touching or steadying assistance(Pt > 75%) Function - Lower Body Dressing/Undressing Lower body dressing/undressing activity did not occur:  (did not occur with this clinician today) What is the patient wearing?: Pants, Non-skid slipper socks Position: Wheelchair/chair at sink Underwear - Performed by helper: Thread/unthread right underwear leg, Thread/unthread left underwear leg, Pull underwear up/down Pants- Performed by patient: Thread/unthread right pants leg,  Thread/unthread left pants leg, Pull pants up/down Pants- Performed by helper: Pull pants up/down Non-skid slipper socks- Performed by patient: Don/doff right sock, Don/doff left sock Non-skid slipper socks- Performed by helper:  (A to stabilize leg in figure 4) Socks - Performed by patient: Don/doff left sock Shoes - Performed by patient: Don/doff right shoe, Don/doff left shoe (elastic laces) Shoes - Performed by helper: Don/doff right shoe, Don/doff left shoe AFO - Performed by patient: Don/doff left AFO AFO - Performed by helper: Don/doff left AFO Assist for footwear: Maximal assist Assist for lower body dressing: Touching or steadying assistance (  Pt > 75%)  Function - Toileting Toileting activity did not occur: No continent bowel/bladder event Toileting steps completed by patient: Adjust clothing prior to toileting, Performs perineal hygiene, Adjust clothing after toileting Toileting steps completed by helper: Adjust clothing prior to toileting, Performs perineal hygiene, Adjust clothing after toileting Toileting Assistive Devices: Grab bar or rail Assist level: Touching or steadying assistance (Pt.75%)  Function - Toilet Transfers Toilet transfer activity did not occur:  (using urinal or incont., no BM) Toilet transfer assistive device: Elevated toilet seat/BSC over toilet, Grab bar Mechanical lift: Stedy Assist level to toilet: Touching or steadying assistance (Pt > 75%) Assist level from toilet: Touching or steadying assistance (Pt > 75%)  Function - Chair/bed transfer Chair/bed transfer method: Squat pivot Chair/bed transfer assist level: Touching or steadying assistance (Pt > 75%) Chair/bed transfer assistive device: Armrests Mechanical lift: Stedy Chair/bed transfer details: Tactile cues for sequencing, Tactile cues for weight shifting, Tactile cues for posture, Tactile cues for placement, Verbal cues for technique, Manual facilitation for weight shifting, Manual  facilitation for placement  Function - Locomotion: Wheelchair Will patient use wheelchair at discharge?: Yes Type: Manual Max wheelchair distance: 150 ft Assist Level: Supervision or verbal cues Assist Level: Supervision or verbal cues Assist Level: Supervision or verbal cues Turns around,maneuvers to table,bed, and toilet,negotiates 3% grade,maneuvers on rugs and over doorsills: No Function - Locomotion: Ambulation Ambulation activity did not occur: Safety/medical concerns (pt pushes toward hemi side; unsafe) Assistive device: Rail in hallway, Orthosis Max distance: 10 Assist level: 2 helpers Walk 10 feet activity did not occur: Safety/medical concerns Assist level: 2 helpers Walk 50 feet with 2 turns activity did not occur: Safety/medical concerns Walk 150 feet activity did not occur: Safety/medical concerns Walk 10 feet on uneven surfaces activity did not occur: Safety/medical concerns  Function - Comprehension Comprehension: Auditory Comprehension assist level: Understands basic 75 - 89% of the time/ requires cueing 10 - 24% of the time  Function - Expression Expression: Verbal Expression assist level: Expresses basic needs/ideas: With no assist  Function - Social Interaction Social Interaction assist level: Interacts appropriately 90% of the time - Needs monitoring or encouragement for participation or interaction.  Function - Problem Solving Problem solving assist level: Solves basic problems with no assist  Function - Memory Memory assist level: Recognizes or recalls 90% of the time/requires cueing < 10% of the time Patient normally able to recall (first 3 days only): Current season, Location of own room, Staff names and faces, That he or she is in a hospital  Medical Problem List and Plan: 1.  Left hemiplegia, limitations with self-care, difficulty with transfers secondary to right MCA CVA. Looking for SNF per SW  Cont CIR PT, OT, speech therapies. 2.  DVT  Prophylaxis/Anticoagulation: Pharmaceutical: Xarelto for PE, expect at least 3 mo tx 3. Pain Management: Left hip/groin pain may be spasms no sig degenerative changes on Xray, may need to adjust zanaflex 4. Mood: Team to provide ego support.  LCSW to follow for evaluation and support.   5. Neuropsych: This patient is capable of making decisions on his own behalf. 6. Skin/Wound Care: routine pressure relief measures.  7. Fluids/Electrolytes/Nutrition: Monitor I/O. Check lytes in am. Encourage fluid intake.  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 9. T2DM: Hgb A1C- 10.5 --. Continue metformin 1058m bid. Was started on lantus  45 units daily at bedtime, with SSI for elevated BS--will consult RD for diabetic education and  start education on insulin administration.  Will monitor BS ac/hs,at goal 4/6 10.  Acute/subacute right lower lobe pulmonary embolism.asymptomatic  Xarelto,  neurologically stable CBG (last 3)   Recent Labs  09/23/16 1719 09/23/16 2053 09/24/16 0640  GLUCAP 99 115* 113*    10. HTN: Monitor BP bid--on lisinopril 101m daily.stabilizing in normal range 4/6 Vitals:   09/23/16 1402 09/24/16 0518  BP: (!) 131/57 115/72  Pulse: 81 80  Resp: 19 18  Temp: 97.9 F (36.6 C) 98.6 F (37 C)    11. Leucocytosis: Monitor for signs of infection. UA negative. Afebrile,inproving WBC 11.6 12. Polycythemia: Smokes 1.5 PPD. Expect this to remain elevated during hospitalization, On NicoDerm patch 13. COPD: SOB reported--CXR negative. Encourage tobacco cessation.  Managed without medications at this time.   14. Tobacco abuse: Counsel  14. Hypoalbuminemia, start pro-stat  15 Aortic aneursym infrarenal 6.8cm---will keep sys BPs on lower side 100-120s   Conservative mgt for now---. Dr. CDonzetta Matterswill follow up with patient in office once he's further recovered from CVA  LOS (Days) 30 A FACE TO FACE EVALUATION WAS  PWest PerrineE 09/24/2016, 6:58 AM

## 2016-09-24 NOTE — Progress Notes (Signed)
Physical Therapy Session Note  Patient Details  Name: Chad Combs MRN: [id number] Date of Birth: 1955-12-11  Today's Date: 09/24/2016 PT Individual Time: 1006-1047 PT Individual Time Calculation (min): 41 min   Short Term Goals: Week 4:  PT Short Term Goal 1 (Week 4): STG =LTG due to ELSO  Skilled Therapeutic Interventions/Progress Updates:   Pt received sitting in WC and agreeable to PT. RN present of medication administration. Pt reports L shoulder pain secondary to subluxation. PT assisted pt to don Givmohr Sling. Reports decreased shoulder pain from 6/10 to 3/10 .   WC mobility i in hall of hospital x 159f with supervision assist, min cues required  From PT to prevent hitting door frames with LLE. Pt able to adjust to all other obstacles on L on this day.   PT instructed pt in gait training with HW and L AFO, as well as max assist for LLE limb advancement. PT provided sequencing cues for  AD management and step pattern. Noted improvement in maintaining neutral LLE rotation and improved LE knee stability with decreased buckling and genu recurvatum; PT still required to prevent knee instability 25% of the time.   Nustep reciprcal movement training 2x 5 minutes with BLE only. Emphasis on LLE hip  And knee flexion.   Squat pivot transfer training with PT to provide min assist overall and one instance of mod-max assist to prevent fall due to poor LE positioning.    [facility] and all needs met.          Therapy Documentation Precautions:  Precautions Precautions: Fall Precaution Comments: L sided neglect, shoulder increased risk for subluxation; quick LOB L wihtout awareness Restrictions Weight Bearing Restrictions: No Vital Signs: Oxygen Therapy O2 Device: Not Delivered Pain: 6/10 L shoulder. Pulling.    See Function Navigator for Current Functional Status.   Therapy/Group: Individual Therapy  ALorie Phenix4/11/2016, 2:21 PM

## 2016-09-25 ENCOUNTER — Inpatient Hospital Stay (HOSPITAL_COMMUNITY): Payer: Self-pay | Admitting: Physical Therapy

## 2016-09-25 ENCOUNTER — Inpatient Hospital Stay (HOSPITAL_COMMUNITY): Payer: Self-pay

## 2016-09-25 LAB — GLUCOSE, CAPILLARY
GLUCOSE-CAPILLARY: 87 mg/dL (ref 65–99)
Glucose-Capillary: 105 mg/dL — ABNORMAL HIGH (ref 65–99)
Glucose-Capillary: 132 mg/dL — ABNORMAL HIGH (ref 65–99)
Glucose-Capillary: 83 mg/dL (ref 65–99)

## 2016-09-25 NOTE — Progress Notes (Signed)
Physical Therapy Weekly Progress Note  Patient Details  Name: Chad Combs MRN: [id number] Date of Birth: [birth year]  Beginning of progress report period: September 17, 2016 End of progress report period: September 25, 2016  Today's Date: 09/25/2016 PT Individual Time: [phone]  Patient has met 1 of 1 short term goals.  Pt demonstrates improved LLE neuromotor control in standing to prevent knee collapse while wearing AFO, but continues to be unable to lift against gravity.   Patient continues to demonstrate the following deficits muscle weakness, decreased cardiorespiratoy endurance, impaired timing and sequencing, abnormal tone and unbalanced muscle activation, decreased attention to left, decreased attention, decreased awareness, decreased problem solving and decreased safety awareness and decreased standing balance, decreased postural control, hemiplegia and decreased balance strategies and therefore will continue to benefit from skilled PT intervention to increase functional independence with mobility.  Patient progressing toward long term goals..  Continue plan of care.  PT Short Term Goals Week 4:  PT Short Term Goal 1 (Week 4): STG=LTG due to ELOS PT Short Term Goal 1 - Progress (Week 4): Progressing toward goal  Skilled Therapeutic Interventions/Progress Updates:   Blocked practice transfer training intermittent Supervision assist-min assist from PT. PT continues to require to provided min-mod cues for proper set up and sequencing to prevent LOB with transfers to the L.   Gait with HW and LAFO x 54f with max assist for LLE advancement and min assist at knee to prevent buckling. Pt demonstrated improved initiation of limb advancement, but unable to progress without assist from PT.   [facility] and all needs met.           Therapy Documentation Precautions:  Precautions Precautions: Fall Precaution Comments: L sided  neglect, shoulder increased risk for subluxation; quick LOB L wihtout awareness Restrictions Weight Bearing Restrictions: No    Vital Signs: Therapy Vitals Temp: 97.6 F (36.4 C) Temp Source: Oral Pulse Rate: 76 Resp: 18 BP: 124/75 Patient Position (if appropriate): Lying Oxygen Therapy SpO2: 91 % O2 Device: Not Delivered Pain: 3/10 in L hip.   See Function Navigator for Current Functional Status.  Therapy/Group: Individual Therapy  ALorie Phenix4/12/2016, 7:52 AM

## 2016-09-25 NOTE — Progress Notes (Signed)
Occupational Therapy Session Note  Patient Details  Name: Chad Combs MRN: [id number] Date of Birth: 05/14/1956  Today's Date: 09/25/2016 OT Individual Time: 1191-4782 OT Individual Time Calculation (min): 45 min    Short Term Goals: Week 3:  OT Short Term Goal 1 (Week 3): Pt will consistently complete transfer to toilet with mod A of 1 person A only. OT Short Term Goal 1 - Progress (Week 3): Met OT Short Term Goal 2 (Week 3): Pt will be able to pull pants over hips himself with min A to stabilize balance. OT Short Term Goal 2 - Progress (Week 3): Not met OT Short Term Goal 3 (Week 3): Pt will be able to wash R and L leg arm using a long handled sponge with S. OT Short Term Goal 3 - Progress (Week 3): Progressing toward goal OT Short Term Goal 4 (Week 3): Pt will be able to wt shift to L side safely to wash bottom with lateral lean on tub bench with steadying A. OT Short Term Goal 4 - Progress (Week 3): Other (comment) (Not addressed)  Skilled Therapeutic Interventions/Progress Updates:    1:1. No complaint of pain. Pt report need to void bowel. Pt squat pivot transfer with CGA using grab bar w/c<>BSC over toilet with VC to set up LE prior to transfer. Pt able to manage clothing with MIN A for steadying. Pt requires increased time to have BM, and stands with supervision using grab bar for balance as OT completes posterior hygiene. Pt propels w/c using RUE and RLE with VC to attend to L side to avoid obstacles/sink. Pt able to set up squat pivot transfer w/c<>EOM with CGA and VC for leg/arm rest management. While sitting unsupported with supervision pt don/doff B shoes with cues to assume seated figure 4 and use shoe horn. Pt require increased time to problem solve and trial hoe horn use. Pt returned to room with call light in reach, declining QRB and all needs met.   Therapy Documentation Precautions:  Precautions Precautions: Fall Precaution Comments: L sided neglect, shoulder increased  risk for subluxation; quick LOB L wihtout awareness Restrictions Weight Bearing Restrictions: No General:    Other Treatments:    See Function Navigator for Current Functional Status.   Therapy/Group: Individual Therapy  Tonny Branch 09/25/2016, 12:11 PM

## 2016-09-25 NOTE — Progress Notes (Signed)
Subjective/Complaints: Pt seen laying in bed this AM.  He states he slept well overnight.   ROS: Denies CP, SOB, N/V/D.    Objective: Vital Signs: Blood pressure 120/70, pulse 80, temperature 97.6 F (36.4 C), temperature source Oral, resp. rate 18, height 5\' 9"  (1.753 m), weight 85.5 kg (188 lb 6.4 oz), SpO2 96 %. No results found. Results for orders placed or performed during the hospital encounter of 08/25/16 (from the past 72 hour(s))  Glucose, capillary     Status: Abnormal   Collection Time: 09/22/16  9:13 PM  Result Value Ref Range   Glucose-Capillary 140 (H) 65 - 99 mg/dL  Glucose, capillary     Status: Abnormal   Collection Time: 09/23/16  6:22 AM  Result Value Ref Range   Glucose-Capillary 108 (H) 65 - 99 mg/dL  Glucose, capillary     Status: Abnormal   Collection Time: 09/23/16 11:33 AM  Result Value Ref Range   Glucose-Capillary 117 (H) 65 - 99 mg/dL   Comment 1 Notify RN   Glucose, capillary     Status: None   Collection Time: 09/23/16  5:19 PM  Result Value Ref Range   Glucose-Capillary 99 65 - 99 mg/dL   Comment 1 Notify RN   Glucose, capillary     Status: Abnormal   Collection Time: 09/23/16  8:53 PM  Result Value Ref Range   Glucose-Capillary 115 (H) 65 - 99 mg/dL  Glucose, capillary     Status: Abnormal   Collection Time: 09/24/16  6:40 AM  Result Value Ref Range   Glucose-Capillary 113 (H) 65 - 99 mg/dL  Glucose, capillary     Status: Abnormal   Collection Time: 09/24/16 12:03 PM  Result Value Ref Range   Glucose-Capillary 147 (H) 65 - 99 mg/dL  Glucose, capillary     Status: Abnormal   Collection Time: 09/24/16  4:42 PM  Result Value Ref Range   Glucose-Capillary 133 (H) 65 - 99 mg/dL  Glucose, capillary     Status: None   Collection Time: 09/24/16  9:33 PM  Result Value Ref Range   Glucose-Capillary 90 65 - 99 mg/dL  Glucose, capillary     Status: Abnormal   Collection Time: 09/25/16  6:36 AM  Result Value Ref Range   Glucose-Capillary 105  (H) 65 - 99 mg/dL  Glucose, capillary     Status: Abnormal   Collection Time: 09/25/16 11:51 AM  Result Value Ref Range   Glucose-Capillary 132 (H) 65 - 99 mg/dL  Glucose, capillary     Status: None   Collection Time: 09/25/16  4:29 PM  Result Value Ref Range   Glucose-Capillary 83 65 - 99 mg/dL     HEENT: Normocephalic, atraumatic.  Cardio: RRR. No JVD.  Resp: CTA bilaterally. Clear GI: BS +. Soft. Skin:   Intact. Warm and dry Neuro:   Motor is 5/5 in the right deltoid, biceps, triceps, grip, hip flexor, knee extensor, ankle dorsiflexor 2-/5 left biceps and finger flexors, left hip, knee extensor synergy, 1/5 at the ankle Dysarthric, increasing LUE extensor tone Musc/Skel:  No edema. No tenderness.  Gen. no acute distress. Well-developed.   Assessment/Plan: 1. Functional deficits secondary to Right MCA infarct with Left Hemiparesis which require 3+ hours per day of interdisciplinary therapy in a comprehensive inpatient rehab setting. Physiatrist is providing close team supervision and 24 hour management of active medical problems listed below. Physiatrist and rehab team continue to assess barriers to discharge/monitor patient progress toward functional and medical  goals. FIM: Function - Bathing Position: Shower Body parts bathed by patient: Left arm, Chest, Abdomen, Front perineal area, Left upper leg, Right arm, Buttocks, Right lower leg, Left lower leg Body parts bathed by helper: Right upper leg, Back Bathing not applicable: Buttocks, Front perineal area Assist Level: Touching or steadying assistance(Pt > 75%)  Function- Upper Body Dressing/Undressing What is the patient wearing?: Pull over shirt/dress Pull over shirt/dress - Perfomed by patient: Thread/unthread right sleeve, Put head through opening, Pull shirt over trunk, Thread/unthread left sleeve Pull over shirt/dress - Perfomed by helper: Thread/unthread left sleeve Button up shirt - Perfomed by patient:  Thread/unthread right sleeve, Thread/unthread left sleeve Button up shirt - Perfomed by helper: Pull shirt around back, Button/unbutton shirt Assist Level: Touching or steadying assistance(Pt > 75%) Function - Lower Body Dressing/Undressing Lower body dressing/undressing activity did not occur:  (did not occur with this clinician today) What is the patient wearing?: Pants, Non-skid slipper socks Position: Wheelchair/chair at sink Underwear - Performed by helper: Thread/unthread right underwear leg, Thread/unthread left underwear leg, Pull underwear up/down Pants- Performed by patient: Thread/unthread left pants leg, Pull pants up/down Pants- Performed by helper: Thread/unthread right pants leg Non-skid slipper socks- Performed by patient: Don/doff right sock, Don/doff left sock Non-skid slipper socks- Performed by helper:  (A to stabilize leg in figure 4) Socks - Performed by patient: Don/doff left sock Shoes - Performed by patient: Don/doff right shoe Regulatory affairs officer) Shoes - Performed by helper: Don/doff left shoe AFO - Performed by patient: Don/doff left AFO AFO - Performed by helper: Don/doff left AFO Assist for footwear: Partial/moderate assist Assist for lower body dressing: Touching or steadying assistance (Pt > 75%)  Function - Toileting Toileting activity did not occur: No continent bowel/bladder event Toileting steps completed by patient: Adjust clothing prior to toileting, Performs perineal hygiene, Adjust clothing after toileting Toileting steps completed by helper: Adjust clothing prior to toileting, Performs perineal hygiene, Adjust clothing after toileting Toileting Assistive Devices: Grab bar or rail Assist level: Touching or steadying assistance (Pt.75%)  Function - Air cabin crew transfer activity did not occur:  (using urinal or incont., no BM) Toilet transfer assistive device: Elevated toilet seat/BSC over toilet, Grab bar Mechanical lift: Stedy Assist  level to toilet: Touching or steadying assistance (Pt > 75%) Assist level from toilet: Touching or steadying assistance (Pt > 75%)  Function - Chair/bed transfer Chair/bed transfer method: Squat pivot Chair/bed transfer assist level: Touching or steadying assistance (Pt > 75%) Chair/bed transfer assistive device: Armrests Mechanical lift: Stedy Chair/bed transfer details: Tactile cues for sequencing, Tactile cues for weight shifting, Tactile cues for posture, Tactile cues for placement, Verbal cues for technique, Manual facilitation for weight shifting, Manual facilitation for placement  Function - Locomotion: Wheelchair Will patient use wheelchair at discharge?: Yes Type: Manual Max wheelchair distance: 158ft  Assist Level: Supervision or verbal cues Assist Level: Supervision or verbal cues Assist Level: Supervision or verbal cues Turns around,maneuvers to table,bed, and toilet,negotiates 3% grade,maneuvers on rugs and over doorsills: No Function - Locomotion: Ambulation Ambulation activity did not occur: Safety/medical concerns (pt pushes toward hemi side; unsafe) Assistive device: Walker-hemi Max distance: 57ft  Assist level: Maximal assist (Pt 25 - 49%) Walk 10 feet activity did not occur: Safety/medical concerns Assist level: Maximal assist (Pt 25 - 49%) Walk 50 feet with 2 turns activity did not occur: Safety/medical concerns Walk 150 feet activity did not occur: Safety/medical concerns Walk 10 feet on uneven surfaces activity did not occur: Safety/medical concerns  Function -  Comprehension Comprehension: Auditory Comprehension assist level: Understands basic 75 - 89% of the time/ requires cueing 10 - 24% of the time  Function - Expression Expression: Verbal Expression assist level: Expresses basic needs/ideas: With no assist  Function - Social Interaction Social Interaction assist level: Interacts appropriately 90% of the time - Needs monitoring or encouragement for  participation or interaction.  Function - Problem Solving Problem solving assist level: Solves basic problems with no assist  Function - Memory Memory assist level: Recognizes or recalls 90% of the time/requires cueing < 10% of the time Patient normally able to recall (first 3 days only): Current season, Location of own room, Staff names and faces, That he or she is in a hospital  Medical Problem List and Plan: 1.  Left hemiplegia, limitations with self-care, difficulty with transfers secondary to right MCA CVA.  Cont CIR   Stable for SNF placement 2.  DVT Prophylaxis/Anticoagulation: Pharmaceutical: Xarelto for PE, expect at least 3 mo tx 3. Pain Management: Left hip/groin pain may be spasms no sig degenerative changes on Xray, may need to adjust zanaflex 4. Mood: Team to provide ego support.  LCSW to follow for evaluation and support.  5. Neuropsych: This patient is capable of making decisions on his own behalf. 6. Skin/Wound Care: routine pressure relief measures.  7. Fluids/Electrolytes/Nutrition: Monitor I/O. Encourage fluid intake.  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 9. T2DM: Hgb A1C- 10.5 --. Continue metformin 1000mg  bid. Was started on lantus  45 units daily at bedtime, with SSI for elevated BS, consulted RD for diabetic education and start education on insulin administration.  Will monitor BS ac/hs  CBGs relatively controlled 4/7 10.  Acute/subacute right lower lobe pulmonary embolism.asymptomatic  Xarelto,  neurologically stable 11. HTN: Monitor BP bid--on lisinopril 20mg  daily  Controlled 4/7 Vitals:   09/25/16 0902 09/25/16 1415  BP: 119/75 120/70  Pulse:  80  Resp:  18  Temp:  97.6 F (36.4 C)    12. Leucocytosis: Monitor for signs of infection. UA negative. Afebrile,inproving WBC 11.6 on 4/4 13. Polycythemia: Smokes 1.5 PPD. Expect this to remain elevated during hospitalization, On NicoDerm patch 14.  COPD: SOB reported--CXR negative. Encourage tobacco cessation.  Managed without medications at this time.    15. Tobacco abuse: Counsel 16. Hypoalbuminemia, start pro-stat 17 Aortic aneursym infrarenal 6.8cm---will keep sys BPs on lower side 100-120s   Conservative mgt for now---. Dr. Donzetta Matters will follow up with patient in office once he's further recovered from CVA  LOS (Days) 31 A FACE TO FACE EVALUATION WAS PERFORMED  Aasia Peavler [name] 09/25/2016, 5:11 PM

## 2016-09-26 ENCOUNTER — Inpatient Hospital Stay (HOSPITAL_COMMUNITY): Payer: Self-pay | Admitting: Physical Therapy

## 2016-09-26 DIAGNOSIS — I714 Abdominal aortic aneurysm, without rupture, unspecified: Secondary | ICD-10-CM

## 2016-09-26 LAB — GLUCOSE, CAPILLARY
GLUCOSE-CAPILLARY: 93 mg/dL (ref 65–99)
GLUCOSE-CAPILLARY: 87 mg/dL (ref 65–99)
Glucose-Capillary: 94 mg/dL (ref 65–99)
Glucose-Capillary: 158 mg/dL — ABNORMAL HIGH (ref 65–99)

## 2016-09-26 NOTE — Progress Notes (Signed)
Physical Therapy Session Note  Patient Details  Name: Chad Combs MRN: [id number] Date of Birth: 11-17-1955  Today's Date: 09/26/2016 PT Individual Time: 8757-9728 PT Individual Time Calculation (min): 53 min   Skilled Therapeutic Interventions/Progress Updates:  Pt received in bed & agreeable to tx. Pt transferred supine<>sitting EOB with min assist and cuing for technique. Therapist donned pt's shoes & L AFO total assist for time management. Pt transferred bed<>w/c via squat pivot with steady assist and propelled w/c room>gym with R hemi technique and supervision. Pt requires max cuing to attend to L side of body and L side of environment, as well as cuing for steering w/c. Pt required more than reasonable amount of time to propel w/c room>gym. Pt engaged in pipe tree assembly with task focusing on problem solving and L attention. Pt required moderate cuing for error correction and to select correct pipe when assembling 2 simple shapes. Remainder of session focused on w/c mobility for endurance training and ramp negotiation. Pt required multiple rest breaks 2/2 fatigue & max assist to propel w/c over edge of ramp, as well as min assist to negotiate remainder of ramp. Pt demonstrates decreased safety awareness throughout task. At end of session pt returned to bed in room. Pt left with bed alarm set & all needs within reach.   Therapy Documentation Precautions:  Precautions Precautions: Fall Precaution Comments: L sided neglect, shoulder increased risk for subluxation; quick LOB L without awareness Restrictions Weight Bearing Restrictions: No  Pain: Denied c/o pain.   See Function Navigator for Current Functional Status.   Therapy/Group: Individual Therapy  Waunita Schooner 09/26/2016, 4:46 PM

## 2016-09-26 NOTE — Progress Notes (Signed)
Subjective/Complaints: Patient seen lying in bed this morning. He states he slept fairly overnight. He is eager to tell me about an infomercial that he saw and the fact that he wants to apply this capsule to improve his whole-body.  ROS: Denies CP, SOB, N/V/D.    Objective: Vital Signs: Blood pressure 117/67, pulse 79, temperature 97.9 F (36.6 C), temperature source Oral, resp. rate 18, height 5\' 9"  (1.753 m), weight 85.5 kg (188 lb 6.4 oz), SpO2 93 %. No results found. Results for orders placed or performed during the hospital encounter of 08/25/16 (from the past 72 hour(s))  Glucose, capillary     Status: Abnormal   Collection Time: 09/23/16 11:33 AM  Result Value Ref Range   Glucose-Capillary 117 (H) 65 - 99 mg/dL   Comment 1 Notify RN   Glucose, capillary     Status: None   Collection Time: 09/23/16  5:19 PM  Result Value Ref Range   Glucose-Capillary 99 65 - 99 mg/dL   Comment 1 Notify RN   Glucose, capillary     Status: Abnormal   Collection Time: 09/23/16  8:53 PM  Result Value Ref Range   Glucose-Capillary 115 (H) 65 - 99 mg/dL  Glucose, capillary     Status: Abnormal   Collection Time: 09/24/16  6:40 AM  Result Value Ref Range   Glucose-Capillary 113 (H) 65 - 99 mg/dL  Glucose, capillary     Status: Abnormal   Collection Time: 09/24/16 12:03 PM  Result Value Ref Range   Glucose-Capillary 147 (H) 65 - 99 mg/dL  Glucose, capillary     Status: Abnormal   Collection Time: 09/24/16  4:42 PM  Result Value Ref Range   Glucose-Capillary 133 (H) 65 - 99 mg/dL  Glucose, capillary     Status: None   Collection Time: 09/24/16  9:33 PM  Result Value Ref Range   Glucose-Capillary 90 65 - 99 mg/dL  Glucose, capillary     Status: Abnormal   Collection Time: 09/25/16  6:36 AM  Result Value Ref Range   Glucose-Capillary 105 (H) 65 - 99 mg/dL  Glucose, capillary     Status: Abnormal   Collection Time: 09/25/16 11:51 AM  Result Value Ref Range   Glucose-Capillary 132 (H) 65 -  99 mg/dL  Glucose, capillary     Status: None   Collection Time: 09/25/16  4:29 PM  Result Value Ref Range   Glucose-Capillary 83 65 - 99 mg/dL  Glucose, capillary     Status: None   Collection Time: 09/25/16  9:03 PM  Result Value Ref Range   Glucose-Capillary 87 65 - 99 mg/dL  Glucose, capillary     Status: None   Collection Time: 09/26/16  6:44 AM  Result Value Ref Range   Glucose-Capillary 93 65 - 99 mg/dL     HEENT: Normocephalic, atraumatic.  Cardio: RRR. No JVD.  Resp: CTA bilaterally. Unlabored GI: BS +. Soft. Skin:   Intact. Warm and dry Neuro:   Motor is 5/5 in the right deltoid, biceps, triceps, grip, hip flexor, knee extensor, ankle dorsiflexor 2/5 left biceps and finger flexors, left hip, knee extensor synergy, 1/5 at the ankle Dysarthric, increasing LUE extensor tone Musc/Skel:  No edema. No tenderness.  Gen. no acute distress. Well-developed.   Assessment/Plan: 1. Functional deficits secondary to Right MCA infarct with Left Hemiparesis which require 3+ hours per day of interdisciplinary therapy in a comprehensive inpatient rehab setting. Physiatrist is providing close team supervision and 24 hour management  of active medical problems listed below. Physiatrist and rehab team continue to assess barriers to discharge/monitor patient progress toward functional and medical goals. FIM: Function - Bathing Position: Shower Body parts bathed by patient: Left arm, Chest, Abdomen, Front perineal area, Left upper leg, Right arm, Buttocks, Right lower leg, Left lower leg Body parts bathed by helper: Right upper leg, Back Bathing not applicable: Buttocks, Front perineal area Assist Level: Touching or steadying assistance(Pt > 75%)  Function- Upper Body Dressing/Undressing What is the patient wearing?: Pull over shirt/dress Pull over shirt/dress - Perfomed by patient: Thread/unthread right sleeve, Put head through opening, Pull shirt over trunk, Thread/unthread left  sleeve Pull over shirt/dress - Perfomed by helper: Thread/unthread left sleeve Button up shirt - Perfomed by patient: Thread/unthread right sleeve, Thread/unthread left sleeve Button up shirt - Perfomed by helper: Pull shirt around back, Button/unbutton shirt Assist Level: Touching or steadying assistance(Pt > 75%) Function - Lower Body Dressing/Undressing Lower body dressing/undressing activity did not occur:  (did not occur with this clinician today) What is the patient wearing?: Pants, Non-skid slipper socks Position: Wheelchair/chair at sink Underwear - Performed by helper: Thread/unthread right underwear leg, Thread/unthread left underwear leg, Pull underwear up/down Pants- Performed by patient: Thread/unthread left pants leg, Pull pants up/down Pants- Performed by helper: Thread/unthread right pants leg Non-skid slipper socks- Performed by patient: Don/doff right sock, Don/doff left sock Non-skid slipper socks- Performed by helper:  (A to stabilize leg in figure 4) Socks - Performed by patient: Don/doff left sock Shoes - Performed by patient: Don/doff right shoe Regulatory affairs officer) Shoes - Performed by helper: Don/doff left shoe AFO - Performed by patient: Don/doff left AFO AFO - Performed by helper: Don/doff left AFO Assist for footwear: Partial/moderate assist Assist for lower body dressing: Touching or steadying assistance (Pt > 75%)  Function - Toileting Toileting activity did not occur: No continent bowel/bladder event Toileting steps completed by patient: Adjust clothing prior to toileting, Performs perineal hygiene, Adjust clothing after toileting Toileting steps completed by helper: Adjust clothing prior to toileting, Performs perineal hygiene, Adjust clothing after toileting Toileting Assistive Devices: Grab bar or rail Assist level: Touching or steadying assistance (Pt.75%)  Function - Air cabin crew transfer activity did not occur:  (using urinal or incont., no  BM) Toilet transfer assistive device: Elevated toilet seat/BSC over toilet, Grab bar Mechanical lift: Stedy Assist level to toilet: Touching or steadying assistance (Pt > 75%) Assist level from toilet: Touching or steadying assistance (Pt > 75%)  Function - Chair/bed transfer Chair/bed transfer method: Squat pivot Chair/bed transfer assist level: Touching or steadying assistance (Pt > 75%) Chair/bed transfer assistive device: Armrests Mechanical lift: Stedy Chair/bed transfer details: Tactile cues for sequencing, Tactile cues for weight shifting, Tactile cues for posture, Tactile cues for placement, Verbal cues for technique, Manual facilitation for weight shifting, Manual facilitation for placement  Function - Locomotion: Wheelchair Will patient use wheelchair at discharge?: Yes Type: Manual Max wheelchair distance: 110ft  Assist Level: Supervision or verbal cues Assist Level: Supervision or verbal cues Assist Level: Supervision or verbal cues Turns around,maneuvers to table,bed, and toilet,negotiates 3% grade,maneuvers on rugs and over doorsills: No Function - Locomotion: Ambulation Ambulation activity did not occur: Safety/medical concerns (pt pushes toward hemi side; unsafe) Assistive device: Walker-hemi Max distance: 17ft  Assist level: Maximal assist (Pt 25 - 49%) Walk 10 feet activity did not occur: Safety/medical concerns Assist level: Maximal assist (Pt 25 - 49%) Walk 50 feet with 2 turns activity did not occur: Safety/medical concerns Walk  150 feet activity did not occur: Safety/medical concerns Walk 10 feet on uneven surfaces activity did not occur: Safety/medical concerns  Function - Comprehension Comprehension: Auditory Comprehension assist level: Understands basic 75 - 89% of the time/ requires cueing 10 - 24% of the time  Function - Expression Expression: Verbal Expression assist level: Expresses basic needs/ideas: With no assist  Function - Social  Interaction Social Interaction assist level: Interacts appropriately 90% of the time - Needs monitoring or encouragement for participation or interaction.  Function - Problem Solving Problem solving assist level: Solves basic problems with no assist  Function - Memory Memory assist level: Recognizes or recalls 90% of the time/requires cueing < 10% of the time Patient normally able to recall (first 3 days only): Current season, Location of own room, Staff names and faces, That he or she is in a hospital  Medical Problem List and Plan: 1.  Left hemiplegia, limitations with self-care, difficulty with transfers secondary to right MCA CVA.  Cont CIR   Stable for SNF placement 2.  DVT Prophylaxis/Anticoagulation: Pharmaceutical: Xarelto for PE, expect at least 3 mo tx 3. Pain Management: Left hip/groin pain may be spasms no sig degenerative changes on Xray, may need to adjust zanaflex 4. Mood: Team to provide ego support.  LCSW to follow for evaluation and support.  5. Neuropsych: This patient is capable of making decisions on his own behalf. 6. Skin/Wound Care: routine pressure relief measures.  7. Fluids/Electrolytes/Nutrition: Monitor I/O. Encourage fluid intake.  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 9. T2DM: Hgb A1C- 10.5 --. Continue metformin 1000mg  bid. Was started on lantus  45 units daily at bedtime, with SSI for elevated BS, consulted RD for diabetic education and start education on insulin administration.  Will monitor BS ac/hs  CBGs relatively controlled 4/8 10.  Acute/subacute right lower lobe pulmonary embolism.asymptomatic  Xarelto,  neurologically stable 11. HTN: Monitor BP bid--on lisinopril 20mg  daily  Controlled 4/8 Vitals:   09/25/16 1415 09/26/16 0520  BP: 120/70 117/67  Pulse: 80 79  Resp: 18 18  Temp: 97.6 F (36.4 C) 97.9 F (36.6 C)    12. Leucocytosis: Monitor for signs of infection. UA negative.  Afebrile,inproving WBC 11.6 on 4/4 13. Polycythemia: Smokes 1.5 PPD. Expect this to remain elevated during hospitalization, On NicoDerm patch 14. COPD: SOB reported--CXR negative. Encourage tobacco cessation.  Managed without medications at this time.    15. Tobacco abuse: Counsel 16. Hypoalbuminemia, start pro-stat 17 Aortic aneursym infrarenal 6.8cm---will keep sys BPs on lower side 100-120s   Conservative mgt for now---. Dr. Donzetta Matters will follow up with patient in office once he's further recovered from CVA  LOS (Days) 32 A FACE TO FACE EVALUATION WAS PERFORMED  Ankit [name] 09/26/2016, 7:57 AM

## 2016-09-27 ENCOUNTER — Ambulatory Visit: Payer: Self-pay | Admitting: Cardiovascular Disease

## 2016-09-27 ENCOUNTER — Inpatient Hospital Stay (HOSPITAL_COMMUNITY): Payer: Self-pay | Admitting: Speech Pathology

## 2016-09-27 ENCOUNTER — Inpatient Hospital Stay (HOSPITAL_COMMUNITY): Payer: Self-pay

## 2016-09-27 ENCOUNTER — Encounter: Payer: Self-pay | Admitting: Vascular Surgery

## 2016-09-27 ENCOUNTER — Inpatient Hospital Stay (HOSPITAL_COMMUNITY): Payer: Self-pay | Admitting: Occupational Therapy

## 2016-09-27 LAB — GLUCOSE, CAPILLARY
GLUCOSE-CAPILLARY: 96 mg/dL (ref 65–99)
GLUCOSE-CAPILLARY: 108 mg/dL — AB (ref 65–99)
GLUCOSE-CAPILLARY: 92 mg/dL (ref 65–99)
Glucose-Capillary: 122 mg/dL — ABNORMAL HIGH (ref 65–99)

## 2016-09-27 NOTE — Progress Notes (Signed)
Speech Language Pathology Daily Session Note  Patient Details  Name: Chad Combs MRN: [id number] Date of Birth: [birth year]  Today's Date: 09/27/2016 SLP Individual Time: 5374-8270 SLP Individual Time Calculation (min): 60 min  Short Term Goals: Week 5: SLP Short Term Goal 1 (Week 5): Pt will demonstrate sustained attention in moderately distracting environment for ~45 minutes with Min A verbal cues.  SLP Short Term Goal 2 (Week 5): Pt will complete basic, familiar problem solving tasks with Min A verbal cues.  SLP Short Term Goal 3 (Week 5): Pt will scan to left of his environment to locate items within basic, familiar tasks with Mod A verbal cues.   Skilled Therapeutic Interventions: Skilled treatment session focused on cognition goals. SLP facilitated session by providing Min A verbal cues to sustain attention in moderately distracting environment for ~45 minutes. Pt able to complete basic, familiar problem solving tasks with supervision cues and Mod A verbal cues to scan to left of his environment. Pt was left in bed, bed alarm on and all needs within reach. Continue per current plan of care.      Function:    Cognition Comprehension Comprehension assist level: Understands basic 75 - 89% of the time/ requires cueing 10 - 24% of the time  Expression   Expression assist level: Expresses basic needs/ideas: With no assist  Social Interaction Social Interaction assist level: Interacts appropriately 90% of the time - Needs monitoring or encouragement for participation or interaction.  Problem Solving Problem solving assist level: Solves basic problems with no assist  Memory Memory assist level: Recognizes or recalls 75 - 89% of the time/requires cueing 10 - 24% of the time    Pain    Therapy/Group: Individual Therapy  Patsy Varma B. [name], M.S., CCC-SLP Speech-Language Pathologist  [name] 09/27/2016, 2:55 PM

## 2016-09-27 NOTE — Progress Notes (Signed)
   09/27/16 2349  Respiratory Severity Assessment  Respiratory History 2  Breath Sounds 1  Respiratory Pattern 0  Cough 0  Chest X Ray 0  O2 Requirements 0  Level of Activity 1  Dyspnea 0  Score Total 4  Aerosolized Bronchodilators  Aerosolized bronchodilator indications COPD exacerbation/maintenance therapy  Bronchial Hygiene  Bronchial hygiene indications Bronchial hygiene indicated  Bronchial hygiene goals Clear secretions;Mobilize secretions  Plan of Care Cough & Deep breath  Respiratory Therapy Follow Up Assessment  Assessment follow up date 09/29/16

## 2016-09-27 NOTE — Progress Notes (Signed)
Subjective/Complaints:  Discussed SNF , pt without c/os ROS: Denies CP, SOB, N/V/D.    Objective: Vital Signs: Blood pressure 122/66, pulse 78, temperature 98 F (36.7 C), temperature source Oral, resp. rate 18, height 5\' 9"  (1.753 m), weight 85.5 kg (188 lb 6.4 oz), SpO2 97 %. No results found. Results for orders placed or performed during the hospital encounter of 08/25/16 (from the past 72 hour(s))  Glucose, capillary     Status: Abnormal   Collection Time: 09/24/16 12:03 PM  Result Value Ref Range   Glucose-Capillary 147 (H) 65 - 99 mg/dL  Glucose, capillary     Status: Abnormal   Collection Time: 09/24/16  4:42 PM  Result Value Ref Range   Glucose-Capillary 133 (H) 65 - 99 mg/dL  Glucose, capillary     Status: None   Collection Time: 09/24/16  9:33 PM  Result Value Ref Range   Glucose-Capillary 90 65 - 99 mg/dL  Glucose, capillary     Status: Abnormal   Collection Time: 09/25/16  6:36 AM  Result Value Ref Range   Glucose-Capillary 105 (H) 65 - 99 mg/dL  Glucose, capillary     Status: Abnormal   Collection Time: 09/25/16 11:51 AM  Result Value Ref Range   Glucose-Capillary 132 (H) 65 - 99 mg/dL  Glucose, capillary     Status: None   Collection Time: 09/25/16  4:29 PM  Result Value Ref Range   Glucose-Capillary 83 65 - 99 mg/dL  Glucose, capillary     Status: None   Collection Time: 09/25/16  9:03 PM  Result Value Ref Range   Glucose-Capillary 87 65 - 99 mg/dL  Glucose, capillary     Status: None   Collection Time: 09/26/16  6:44 AM  Result Value Ref Range   Glucose-Capillary 93 65 - 99 mg/dL  Glucose, capillary     Status: None   Collection Time: 09/26/16 11:59 AM  Result Value Ref Range   Glucose-Capillary 94 65 - 99 mg/dL  Glucose, capillary     Status: Abnormal   Collection Time: 09/26/16  4:47 PM  Result Value Ref Range   Glucose-Capillary 158 (H) 65 - 99 mg/dL  Glucose, capillary     Status: None   Collection Time: 09/26/16  9:21 PM  Result Value Ref  Range   Glucose-Capillary 87 65 - 99 mg/dL  Glucose, capillary     Status: None   Collection Time: 09/27/16  6:37 AM  Result Value Ref Range   Glucose-Capillary 96 65 - 99 mg/dL     HEENT: Normocephalic, atraumatic.  Cardio: RRR. No JVD.  Resp: CTA bilaterally. Unlabored GI: BS +. Soft. Skin:   Intact. Warm and dry Neuro:   Motor is 5/5 in the right deltoid, biceps, triceps, grip, hip flexor, knee extensor, ankle dorsiflexor 2/5 left biceps and finger flexors, left hip, knee extensor synergy, 1/5 at the ankle Dysarthric, increasing LUE extensor tone Musc/Skel:  No edema. No tenderness.  Gen. no acute distress. Well-developed.   Assessment/Plan: 1. Functional deficits secondary to Right MCA infarct with Left Hemiparesis which require 3+ hours per day of interdisciplinary therapy in a comprehensive inpatient rehab setting. Physiatrist is providing close team supervision and 24 hour management of active medical problems listed below. Physiatrist and rehab team continue to assess barriers to discharge/monitor patient progress toward functional and medical goals. FIM: Function - Bathing Position: Shower Body parts bathed by patient: Left arm, Chest, Abdomen, Front perineal area, Left upper leg, Right arm, Buttocks, Right lower  leg, Left lower leg Body parts bathed by helper: Right upper leg, Back Bathing not applicable: Buttocks, Front perineal area Assist Level: Touching or steadying assistance(Pt > 75%)  Function- Upper Body Dressing/Undressing What is the patient wearing?: Pull over shirt/dress Pull over shirt/dress - Perfomed by patient: Thread/unthread right sleeve, Put head through opening, Pull shirt over trunk, Thread/unthread left sleeve Pull over shirt/dress - Perfomed by helper: Thread/unthread left sleeve Button up shirt - Perfomed by patient: Thread/unthread right sleeve, Thread/unthread left sleeve Button up shirt - Perfomed by helper: Pull shirt around back,  Button/unbutton shirt Assist Level: Touching or steadying assistance(Pt > 75%) Function - Lower Body Dressing/Undressing Lower body dressing/undressing activity did not occur:  (did not occur with this clinician today) What is the patient wearing?: Pants, Non-skid slipper socks Position: Wheelchair/chair at sink Underwear - Performed by helper: Thread/unthread right underwear leg, Thread/unthread left underwear leg, Pull underwear up/down Pants- Performed by patient: Thread/unthread left pants leg, Pull pants up/down Pants- Performed by helper: Thread/unthread right pants leg Non-skid slipper socks- Performed by patient: Don/doff right sock, Don/doff left sock Non-skid slipper socks- Performed by helper:  (A to stabilize leg in figure 4) Socks - Performed by patient: Don/doff left sock Shoes - Performed by patient: Don/doff right shoe Regulatory affairs officer) Shoes - Performed by helper: Don/doff left shoe AFO - Performed by patient: Don/doff left AFO AFO - Performed by helper: Don/doff left AFO Assist for footwear: Partial/moderate assist Assist for lower body dressing: Touching or steadying assistance (Pt > 75%)  Function - Toileting Toileting activity did not occur: No continent bowel/bladder event Toileting steps completed by patient: Adjust clothing prior to toileting, Performs perineal hygiene, Adjust clothing after toileting Toileting steps completed by helper: Adjust clothing prior to toileting, Performs perineal hygiene, Adjust clothing after toileting Toileting Assistive Devices: Grab bar or rail Assist level: Touching or steadying assistance (Pt.75%)  Function - Air cabin crew transfer activity did not occur:  (using urinal or incont., no BM) Toilet transfer assistive device: Mechanical lift, Elevated toilet seat/BSC over toilet Mechanical lift: Stedy Assist level to toilet: Touching or steadying assistance (Pt > 75%) Assist level from toilet: Touching or steadying  assistance (Pt > 75%)  Function - Chair/bed transfer Chair/bed transfer method: Squat pivot Chair/bed transfer assist level: Touching or steadying assistance (Pt > 75%) Chair/bed transfer assistive device: Armrests Mechanical lift: Stedy Chair/bed transfer details: Tactile cues for weight shifting, Tactile cues for sequencing, Tactile cues for placement, Tactile cues for posture, Manual facilitation for placement, Manual facilitation for weight shifting  Function - Locomotion: Wheelchair Will patient use wheelchair at discharge?: Yes Type: Manual Max wheelchair distance: 150 ft Assist Level: Supervision or verbal cues Assist Level: Supervision or verbal cues Assist Level: Supervision or verbal cues Turns around,maneuvers to table,bed, and toilet,negotiates 3% grade,maneuvers on rugs and over doorsills: No Function - Locomotion: Ambulation Ambulation activity did not occur: Safety/medical concerns (pt pushes toward hemi side; unsafe) Assistive device: Walker-hemi Max distance: 49ft  Assist level: Maximal assist (Pt 25 - 49%) Walk 10 feet activity did not occur: Safety/medical concerns Assist level: Maximal assist (Pt 25 - 49%) Walk 50 feet with 2 turns activity did not occur: Safety/medical concerns Walk 150 feet activity did not occur: Safety/medical concerns Walk 10 feet on uneven surfaces activity did not occur: Safety/medical concerns  Function - Comprehension Comprehension: Auditory Comprehension assist level: Understands basic 75 - 89% of the time/ requires cueing 10 - 24% of the time  Function - Expression Expression: Verbal Expression assist  level: Expresses basic needs/ideas: With no assist  Function - Social Interaction Social Interaction assist level: Interacts appropriately 90% of the time - Needs monitoring or encouragement for participation or interaction.  Function - Problem Solving Problem solving assist level: Solves basic problems with no assist  Function -  Memory Memory assist level: Recognizes or recalls 90% of the time/requires cueing < 10% of the time Patient normally able to recall (first 3 days only): Current season, Location of own room, Staff names and faces, That he or she is in a hospital  Medical Problem List and Plan: 1.  Left hemiplegia, limitations with self-care, difficulty with transfers secondary to right MCA CVA.  Cont CIR PT, OT, SLP  Stable for SNF placement 2.  DVT Prophylaxis/Anticoagulation: Pharmaceutical: Xarelto for PE, expect at least 3 mo tx 3. Pain Management: Left hip/groin pain may be spasms no sig degenerative changes on Xray, may need to adjust zanaflex 4. Mood: Team to provide ego support.  LCSW to follow for evaluation and support.  5. Neuropsych: This patient is capable of making decisions on his own behalf. 6. Skin/Wound Care: routine pressure relief measures.  7. Fluids/Electrolytes/Nutrition: Monitor I/O. Encourage fluid intake.  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 9. T2DM: Hgb A1C- 10.5 --. Continue metformin 1000mg  bid. Was started on lantus  45 units daily at bedtime, with SSI for elevated BS, consulted RD for diabetic education and start education on insulin administration.  Will monitor BS ac/hs   CBG (last 3) controlled 4/9  Recent Labs  09/26/16 1647 09/26/16 2121 09/27/16 0637  GLUCAP 158* 87 96    10.  Acute/subacute right lower lobe pulmonary embolism.asymptomatic  Xarelto,  neurologically stable 11. HTN: Monitor BP bid--on lisinopril 20mg  daily  Controlled 4/9 Vitals:   09/26/16 1430 09/27/16 0538  BP: 115/60 122/66  Pulse: 75 78  Resp: 18 18  Temp: 97.6 F (36.4 C) 98 F (36.7 C)    12. Leucocytosis: Monitor for signs of infection. UA negative. Afebrile,inproving WBC 11.6 on 4/4 13. Polycythemia: Smokes 1.5 PPD. Expect this to remain elevated during hospitalization, 14. COPD: SOB reported--CXR negative. Encourage  tobacco cessation.  Managed without medications at this time.    15. Tobacco abuse: Counsel 16. Hypoalbuminemia, start pro-stat 17 Aortic aneursym infrarenal 6.8cm---will keep sys BPs on lower side 100-120s   Conservative mgt for now---. Dr. Donzetta Matters will follow up with patient in office once he's further recovered from CVA  LOS (Days) 35 A FACE TO FACE EVALUATION WAS [place] E 09/27/2016, 6:41 AM

## 2016-09-27 NOTE — Progress Notes (Signed)
Occupational Therapy Session Note  Patient Details  Name: Chad Combs MRN: [id number] Date of Birth: [birth year]  Today's Date: 09/27/2016 OT Individual Time: 0100-7121 OT Individual Time Calculation (min): 75 min    Short Term Goals: Week 5:  OT Short Term Goal 1 (Week 5): STG=LTG due to awaiting SNF placement  Skilled Therapeutic Interventions/Progress Updates:    Pt seen for OT session focusing on ADL re-training. Pt in supine upon arrival with MD present completing morning [name]. Pt agreeable to tx session. He denied bathing task this morning. He donned pants in supine position with assist for management of L LE and VCs for clothing orientation. With min A for stabilization of L LE, pt able to bridge to pull pants up. He transferred to EOB with min A using hospital bed functions and VCs for technique. Seated EOB, pt donned shoes, crossed ankle over knee to don L shoe, assist for AFO. He completed squat pivot transfer with guarding assist and VCs for placement of L LE. Grooming completed from w/c level at sink. He stood with heavy assist to set-up oral care and then brushed teeth from sitting position. Shaving completed total A as pt noted to have increased tremors in R UE, MD already aware. Pt was able to apply shaving cream to all areas of face in prep for shaving task. He required significantly increased time for clothing orientation when completing UB dressing task, able to recall hemi-dressing technique when asked, however, did not initiate it independently. He was able to don shirt with set-up/ VCs. Pt self propelled w/c to therapy gym using hemi technique, squat pivot to therapy mat with min A and awareness to L LE placement. Seated EOM, pt completed reaching activity to L, facilitate weightbearing through L hip and L UE with focusing on attention to L. Completed x2 trials seated EOM. Completed then in standing with therapist supporting L UE and pt reaching to object placed at  midline. Pt returned to w/c and propelled w/c back to room in same manner as described above.  Pt left seated in w/c with QRB donned and chair alarm on.   Therapy Documentation Precautions:  Precautions Precautions: Fall Precaution Comments: L sided neglect, shoulder increased risk for subluxation; quick LOB L wihtout awareness Restrictions Weight Bearing Restrictions: No Pain:   No/ denies pain  See Function Navigator for Current Functional Status.   Therapy/Group: Individual Therapy  Lewis, Adalena Abdulla C 09/27/2016, 7:10 AM

## 2016-09-27 NOTE — Progress Notes (Signed)
Social Work Patient ID: Chad Combs, male   DOB: 1955/09/17, 61 y.o.   MRN: [id number]  Spoke with son-Chad Combs who is working on pre-burial and Chad Combs meet with MA worker to apply today. Aware MA needs to be begun ASAP. He Chad Combs contact worker once application is started with MA worker name and number. Continue to work on discharge plans.

## 2016-09-27 NOTE — Progress Notes (Addendum)
Physical Therapy Note  Patient Details  Name: Chad Combs MRN: [id number] Date of Birth: 07/15/1955 Today's Date: 09/27/2016  tx 1:  1118-1202, 36 min individual tx pain: none reported  w/c propulsion using hemi technique, supervision for obstacles on L.  Squat pivot transfers to L/R with min assist, mod cues for set up.  Blocked practice sit>< stand with HW, supervision, focusing on safe hand placement.  Gait training using hallway railing, with L Toe Off AFO, shoe cover on L shoe, GivMohr sling LUE, x 25' with max assist for swing and stance phase stability LLE.  Pt initiates swing with passive L hip and knee flexion, but is unable to progress LE.  Gait with HW x 20' with same assistance.   Pt left resting in w/c with quick release belt applied and all needs within reach.  tx 2:  1530-1600, 30 min individual tx Pain: none reported  neuromuscular re-education via multimodal cues for LLE activation in supine, during bil bridging, L mass extension after passive mass flexion, active assistive short arc quad knee ext, active assistive bil hip internal rotation, bil lower trunk rotation, bil hip adduction in hook lying position.  PT instructed pt in use of L hip rotation active movement to relieve L hip joint stiffness after time in bed.   L hip flex/extensors, , knee flex/extensors noted to be active during neuro re-ed movements. PT assisted pt to doff brief and shorts and don clean brief. Pt left resting in bed in R side lying with pillows for positioning, with alarm set and all needs within reach.  See function navigator for current status.       [name] 09/27/2016, 11:49 AM

## 2016-09-28 ENCOUNTER — Inpatient Hospital Stay (HOSPITAL_COMMUNITY): Payer: Self-pay | Admitting: Speech Pathology

## 2016-09-28 ENCOUNTER — Inpatient Hospital Stay (HOSPITAL_COMMUNITY): Payer: Self-pay | Admitting: Occupational Therapy

## 2016-09-28 ENCOUNTER — Inpatient Hospital Stay (HOSPITAL_COMMUNITY): Payer: Self-pay | Admitting: Physical Therapy

## 2016-09-28 DIAGNOSIS — I2692 Saddle embolus of pulmonary artery without acute cor pulmonale: Secondary | ICD-10-CM

## 2016-09-28 LAB — GLUCOSE, CAPILLARY
GLUCOSE-CAPILLARY: 113 mg/dL — AB (ref 65–99)
GLUCOSE-CAPILLARY: 77 mg/dL (ref 65–99)
Glucose-Capillary: 111 mg/dL — ABNORMAL HIGH (ref 65–99)
Glucose-Capillary: 113 mg/dL — ABNORMAL HIGH (ref 65–99)

## 2016-09-28 NOTE — Progress Notes (Signed)
Speech Language Pathology Weekly Progress and Session Note  Patient Details  Name: Chad Combs MRN: [id number] Date of Birth: Aug 28, 1955  Beginning of progress report period: September 22, 2016 End of progress report period: September 28, 2016  Today's Date: 09/28/2016   Treatment session #1 SLP Individual Time: 1000-1030 SLP Individual Time Calculation (min): 30 min   Treatment session #2 Missed  60 min  Short Term Goals: Week 5: SLP Short Term Goal 1 (Week 5): Pt will demonstrate sustained attention in moderately distracting environment for ~45 minutes with Min A verbal cues.  SLP Short Term Goal 1 - Progress (Week 5): Not met SLP Short Term Goal 2 (Week 5): Pt will complete basic, familiar problem solving tasks with Min A verbal cues.  SLP Short Term Goal 2 - Progress (Week 5): Met SLP Short Term Goal 3 (Week 5): Pt will scan to left of his environment to locate items within basic, familiar tasks with Mod A verbal cues.  SLP Short Term Goal 3 - Progress (Week 5): Not met    New Short Term Goals: Week 6: SLP Short Term Goal 1 (Week 6): Pt will demonstrate sustained attention in moderately distracting environment for ~45 minutes with Min A verbal cues.  SLP Short Term Goal 2 (Week 6): Pt will complete semi-complex problem solving tasks with Mod A verbal cues.  SLP Short Term Goal 3 (Week 6): Pt will scan to left of his environment to locate items within basic, familiar tasks with Mod A verbal cues.  SLP Short Term Goal 4 (Week 6): Pt will identify 2 cognitive and 2 phsycial deficits realted to recent CVA with Mod A verbal cues.   Weekly Progress Updates: Pt has made minimal progress this reporting period and as a result has met 1 of 3 STGs. Pt has had multiple repetitive opportunities to complete basic familiar tasks and as a result he is able to complete with Min A verbal cues. Overall, pt continues to require Mod A for all tasks. MOCA Blind was re-administered on 09/28/16 and pt received  score of 16 out of 22 points (n=>18). This is the same score that he received on the previous administration.      Intensity: Minumum of 1-2 x/day, 30 to 90 minutes Frequency: 3 to 5 out of 7 days Duration/Length of Stay: pending SNF placement Treatment/Interventions: Cognitive remediation/compensation;Cueing hierarchy;Environmental controls;Functional tasks;Internal/external aids;Patient/family education;Speech/Language facilitation;Therapeutic Activities   Daily Session  Skilled Therapeutic Interventions:   Skilled treatment session #1 - focused on readministration of [place] Blind. See above for results.    Function:   Eating Eating   Modified Consistency Diet: Yes Eating Assist Level: Supervision or verbal cues           Cognition Comprehension Comprehension assist level: Understands basic 75 - 89% of the time/ requires cueing 10 - 24% of the time  Expression   Expression assist level: Expresses basic needs/ideas: With no assist  Social Interaction Social Interaction assist level: Interacts appropriately 75 - 89% of the time - Needs redirection for appropriate language or to initiate interaction.  Problem Solving Problem solving assist level: Solves basic 90% of the time/requires cueing < 10% of the time  Memory Memory assist level: Recognizes or recalls 90% of the time/requires cueing < 10% of the time   General    Pain Pain Assessment Pain Assessment: No/denies pain Pain Score: 0-No pain  Therapy/Group: Individual Therapy  Dov Dill 09/28/2016, 10:38 AM

## 2016-09-28 NOTE — Progress Notes (Signed)
Occupational Therapy Session Note  Patient Details  Name: Chad Combs MRN: [id number] Date of Birth: Oct 22, 1955  Today's Date: 09/28/2016 OT Individual Time: 6701-4103 OT Individual Time Calculation (min): 60 min    Short Term Goals: Week 5:  OT Short Term Goal 1 (Week 5): STG=LTG due to awaiting SNF placement  Skilled Therapeutic Interventions/Progress Updates:    Pt seen for OT session focusing on self feeding, attention, and ADL re-training. Pt in supine upon arrival, deisring to eat breakfast. He transferred to EOB with min A and VCs for awareness of L LE. He ate breakfast seated EOB while therapist assisted with weightbearing through R UE/LE during feeding task. He required VCs throughout eating for smaller bites, finishing/ swallowing one bite before taking another, and attention to task as pt easily distracted by telling stories to therapist despite VCs for re-direction and need to focus on task.  He completed min A squat pivot transfer to w/c with VCs for head/hip relationship and L LE placement. He completed dressing task seated in w/c at sink. Required assist for clothing orientation for shirt and pants, and for problem solving dressing hemi side. He stood at sink with min steadying assist to pull pants up. Pt left seated in w/c at end of session, QRB donned and chair alarm activated.  Therapy Documentation Precautions:  Precautions Precautions: Fall Precaution Comments: L sided neglect, shoulder increased risk for subluxation; quick LOB L wihtout awareness Restrictions Weight Bearing Restrictions: No Pain:   No/ denies pain  See Function Navigator for Current Functional Status.   Therapy/Group: Individual Therapy  Lewis, Jerelyn Trimarco C 09/28/2016, 7:06 AM

## 2016-09-28 NOTE — Progress Notes (Signed)
Physical Therapy Session Note  Patient Details  Name: Chad Combs MRN: [id number] Date of Birth: 11/15/1955  Today's Date: 09/28/2016 PT Individual Time: 1100-1200 PT Individual Time Calculation (min): 60 min   Short Term Goals: Week 5:  PT Short Term Goal 1 (Week 5): STG=LTG due to ELOS  Skilled Therapeutic Interventions/Progress Updates: Pt presented in w/c agreeable to therapy with encouragement. PT appears moreso distracted with pt stating increased fatigue this am. Pt propelled to rehab gym with supervision. Performed squat pivot transfer to mat with minA and good verbalizing of sequencing. Trial sit to stand from mat with HW, requiring x2 attempts due to pt demonstrating poor safety awareness with first attempt. Pt bracing RLE against mat with difficulty wt shifting to L to shift RLE. Trial standing with [place] walker with improved results. Pt able to stand and actively engage L quad and shift wt to L. Pt performed advancing and retracting RLE while standing in [place] walker x 10 with good tolerance. Pt required therapeutic breaks during standing due to fatigue. Pt propelled back to room with supervision and performed squat pivot transfer to bed with minA and cues for LLE placement. Pt able to boost self up to [facility] with supervision. Left in room with alarm on and needs met with NT present.      Therapy Documentation Precautions:  Precautions Precautions: Fall Precaution Comments: L sided neglect, shoulder increased risk for subluxation; quick LOB L wihtout awareness Restrictions Weight Bearing Restrictions: No   See Function Navigator for Current Functional Status.   Therapy/Group: Individual Therapy  Chad Combs  Patra Gherardi, PTA  09/28/2016, 1:13 PM

## 2016-09-28 NOTE — Progress Notes (Signed)
Subjective/Complaints:  Continues to have tremor, right hand, no head tremor or right lower extremity tremor, fine intentional  ROS: Denies CP, SOB, N/V/D.    Objective: Vital Signs: Blood pressure 115/60, pulse 76, temperature 97.7 F (36.5 C), temperature source Oral, resp. rate 18, height 5\' 9"  (1.753 m), weight 85.5 kg (188 lb 6.4 oz), SpO2 99 %. No results found. Results for orders placed or performed during the hospital encounter of 08/25/16 (from the past 72 hour(s))  Glucose, capillary     Status: Abnormal   Collection Time: 09/25/16 11:51 AM  Result Value Ref Range   Glucose-Capillary 132 (H) 65 - 99 mg/dL  Glucose, capillary     Status: None   Collection Time: 09/25/16  4:29 PM  Result Value Ref Range   Glucose-Capillary 83 65 - 99 mg/dL  Glucose, capillary     Status: None   Collection Time: 09/25/16  9:03 PM  Result Value Ref Range   Glucose-Capillary 87 65 - 99 mg/dL  Glucose, capillary     Status: None   Collection Time: 09/26/16  6:44 AM  Result Value Ref Range   Glucose-Capillary 93 65 - 99 mg/dL  Glucose, capillary     Status: None   Collection Time: 09/26/16 11:59 AM  Result Value Ref Range   Glucose-Capillary 94 65 - 99 mg/dL  Glucose, capillary     Status: Abnormal   Collection Time: 09/26/16  4:47 PM  Result Value Ref Range   Glucose-Capillary 158 (H) 65 - 99 mg/dL  Glucose, capillary     Status: None   Collection Time: 09/26/16  9:21 PM  Result Value Ref Range   Glucose-Capillary 87 65 - 99 mg/dL  Glucose, capillary     Status: None   Collection Time: 09/27/16  6:37 AM  Result Value Ref Range   Glucose-Capillary 96 65 - 99 mg/dL  Glucose, capillary     Status: Abnormal   Collection Time: 09/27/16 12:03 PM  Result Value Ref Range   Glucose-Capillary 108 (H) 65 - 99 mg/dL  Glucose, capillary     Status: Abnormal   Collection Time: 09/27/16  4:20 PM  Result Value Ref Range   Glucose-Capillary 122 (H) 65 - 99 mg/dL  Glucose, capillary      Status: None   Collection Time: 09/27/16 10:18 PM  Result Value Ref Range   Glucose-Capillary 92 65 - 99 mg/dL     HEENT: Normocephalic, atraumatic.  Cardio: RRR. No JVD.  Resp: CTA bilaterally. Unlabored GI: BS +. Soft. Skin:   Intact. Warm and dry Neuro:   Motor is 5/5 in the right deltoid, biceps, triceps, grip, hip flexor, knee extensor, ankle dorsiflexor 2/5 left biceps and finger flexors, left hip, knee extensor synergy, 1/5 at the ankle Dysarthric, increasing LUE extensor tone Musc/Skel:  No edema. No tenderness.  Gen. no acute distress. Well-developed.   Assessment/Plan: 1. Functional deficits secondary to Right MCA infarct with Left Hemiparesis which require 3+ hours per day of interdisciplinary therapy in a comprehensive inpatient rehab setting. Physiatrist is providing close team supervision and 24 hour management of active medical problems listed below. Physiatrist and rehab team continue to assess barriers to discharge/monitor patient progress toward functional and medical goals. FIM: Function - Bathing Position: Shower Body parts bathed by patient: Left arm, Chest, Abdomen, Front perineal area, Left upper leg, Right arm, Buttocks, Right lower leg, Left lower leg Body parts bathed by helper: Right upper leg, Back Bathing not applicable: Buttocks, Front perineal area Assist  Level: Touching or steadying assistance(Pt > 75%)  Function- Upper Body Dressing/Undressing What is the patient wearing?: Pull over shirt/dress Pull over shirt/dress - Perfomed by patient: Thread/unthread right sleeve, Put head through opening, Pull shirt over trunk, Thread/unthread left sleeve Pull over shirt/dress - Perfomed by helper: Thread/unthread left sleeve Button up shirt - Perfomed by patient: Thread/unthread right sleeve, Thread/unthread left sleeve Button up shirt - Perfomed by helper: Pull shirt around back, Button/unbutton shirt Assist Level: Supervision or verbal cues Function -  Lower Body Dressing/Undressing Lower body dressing/undressing activity did not occur:  (did not occur with this clinician today) What is the patient wearing?: Pants, Non-skid slipper socks Position: Wheelchair/chair at sink Underwear - Performed by helper: Thread/unthread right underwear leg, Thread/unthread left underwear leg, Pull underwear up/down Pants- Performed by patient: Thread/unthread left pants leg, Pull pants up/down Pants- Performed by helper: Thread/unthread right pants leg Non-skid slipper socks- Performed by patient: Don/doff right sock, Don/doff left sock Non-skid slipper socks- Performed by helper:  (A to stabilize leg in figure 4) Socks - Performed by patient: Don/doff left sock Shoes - Performed by patient: Don/doff right shoe Regulatory affairs officer) Shoes - Performed by helper: Don/doff left shoe AFO - Performed by patient: Don/doff left AFO AFO - Performed by helper: Don/doff left AFO Assist for footwear: Partial/moderate assist Assist for lower body dressing: Touching or steadying assistance (Pt > 75%)  Function - Toileting Toileting activity did not occur: No continent bowel/bladder event Toileting steps completed by patient: Adjust clothing prior to toileting, Performs perineal hygiene, Adjust clothing after toileting Toileting steps completed by helper: Adjust clothing prior to toileting, Performs perineal hygiene, Adjust clothing after toileting Toileting Assistive Devices: Grab bar or rail Assist level: Touching or steadying assistance (Pt.75%)  Function - Air cabin crew transfer activity did not occur:  (using urinal or incont., no BM) Toilet transfer assistive device: Mechanical lift, Elevated toilet seat/BSC over toilet Mechanical lift: Stedy Assist level to toilet: Touching or steadying assistance (Pt > 75%) Assist level from toilet: Touching or steadying assistance (Pt > 75%)  Function - Chair/bed transfer Chair/bed transfer method: Squat  pivot Chair/bed transfer assist level: Touching or steadying assistance (Pt > 75%) Chair/bed transfer assistive device: Armrests Mechanical lift: Stedy Chair/bed transfer details: Manual facilitation for weight shifting, Verbal cues for safe use of DME/AE, Verbal cues for technique  Function - Locomotion: Wheelchair Will patient use wheelchair at discharge?: Yes Type: Manual Max wheelchair distance: 150 Assist Level: Supervision or verbal cues Assist Level: Supervision or verbal cues Assist Level: Supervision or verbal cues Turns around,maneuvers to table,bed, and toilet,negotiates 3% grade,maneuvers on rugs and over doorsills: No Function - Locomotion: Ambulation Ambulation activity did not occur: Safety/medical concerns (pt pushes toward hemi side; unsafe) Assistive device: Walker-hemi, Orthosis Max distance: 20 Assist level: Maximal assist (Pt 25 - 49%) Walk 10 feet activity did not occur: Safety/medical concerns Assist level: Maximal assist (Pt 25 - 49%) Walk 50 feet with 2 turns activity did not occur: Safety/medical concerns Walk 150 feet activity did not occur: Safety/medical concerns Walk 10 feet on uneven surfaces activity did not occur: Safety/medical concerns  Function - Comprehension Comprehension: Auditory Comprehension assist level: Understands basic 75 - 89% of the time/ requires cueing 10 - 24% of the time  Function - Expression Expression: Verbal Expression assist level: Expresses basic needs/ideas: With no assist  Function - Social Interaction Social Interaction assist level: Interacts appropriately 75 - 89% of the time - Needs redirection for appropriate language or to initiate interaction.  Function - Problem Solving Problem solving assist level: Solves basic 90% of the time/requires cueing < 10% of the time  Function - Memory Memory assist level: Recognizes or recalls 90% of the time/requires cueing < 10% of the time Patient normally able to recall (first  3 days only): Current season, Location of own room, Staff names and faces, That he or she is in a hospital  Medical Problem List and Plan: 1.  Left hemiplegia, limitations with self-care, difficulty with transfers secondary to right MCA CVA.  Cont CIR PT, OT, SLP- team conf in am  Stable for SNF placement 2.  DVT Prophylaxis/Anticoagulation: Pharmaceutical: Xarelto for PE, expect at least 3 mo tx 3. Pain Management: Left hip/groin pain Improved no sig degenerative changes on Xray,4. Mood: Team to provide ego support.  LCSW to follow for evaluation and support.  5. Neuropsych: This patient is capable of making decisions on his own behalf. 6. Skin/Wound Care: routine pressure relief measures.  7. Fluids/Electrolytes/Nutrition: Monitor I/O. Encourage fluid intake.  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 9. T2DM: Hgb A1C- 10.5 --. Continue metformin 1000mg  bid. Was started on lantus  45 units daily at bedtime, with SSI for elevated BS, consulted RD for diabetic education and start education on insulin administration.  Will monitor BS ac/hs   CBG (last 3) controlled 4/10  Recent Labs  09/27/16 1203 09/27/16 1620 09/27/16 2218  GLUCAP 108* 122* 92    10.  Acute/subacute right lower lobe pulmonary embolism.asymptomatic  Xarelto,  neurologically stable 11. HTN: Monitor BP bid--on lisinopril 20mg  daily  Controlled 4/10 Vitals:   09/27/16 1400 09/28/16 0513  BP: 120/65 115/60  Pulse: 70 76  Resp: 18 18  Temp: 98.1 F (36.7 C) 97.7 F (36.5 C)    12. Leucocytosis: Monitor for signs of infection. UA negative. Afebrile,inproving WBC 11.6 on 4/4 13. Polycythemia: Smokes 1.5 PPD. Expect this to remain elevated during hospitalization, 14. COPD: SOB reported--CXR negative. Encourage tobacco cessation.  Managed without medications at this time.    15. Tobacco abuse: Counsel 16. Hypoalbuminemia, start pro-stat 17 Aortic aneursym  infrarenal 6.8cm---will keep sys BPs on lower side 100-120s   Conservative mgt for now---. Dr. Donzetta Matters will follow up with patient in office once he's further recovered from CVA 18. Tremor will discontinue gabapentin LOS (Days) 34 A FACE TO FACE EVALUATION WAS PERFORMED  KIRSTEINS,ANDREW E 09/28/2016, 6:48 AM

## 2016-09-29 ENCOUNTER — Inpatient Hospital Stay (HOSPITAL_COMMUNITY): Payer: Self-pay | Admitting: Physical Therapy

## 2016-09-29 ENCOUNTER — Inpatient Hospital Stay (HOSPITAL_COMMUNITY): Payer: Self-pay | Admitting: Occupational Therapy

## 2016-09-29 ENCOUNTER — Inpatient Hospital Stay (HOSPITAL_COMMUNITY): Payer: Self-pay | Admitting: Speech Pathology

## 2016-09-29 LAB — CBC
HEMATOCRIT: 47.7 % (ref 39.0–52.0)
Hemoglobin: 15.7 g/dL (ref 13.0–17.0)
MCH: 32.4 pg (ref 26.0–34.0)
MCHC: 32.9 g/dL (ref 30.0–36.0)
MCV: 98.6 fL (ref 78.0–100.0)
Platelets: 202 10*3/uL (ref 150–400)
RBC: 4.84 MIL/uL (ref 4.22–5.81)
RDW: 12.6 % (ref 11.5–15.5)
WBC: 12.1 10*3/uL — ABNORMAL HIGH (ref 4.0–10.5)

## 2016-09-29 LAB — GLUCOSE, CAPILLARY
GLUCOSE-CAPILLARY: 131 mg/dL — AB (ref 65–99)
Glucose-Capillary: 125 mg/dL — ABNORMAL HIGH (ref 65–99)
Glucose-Capillary: 132 mg/dL — ABNORMAL HIGH (ref 65–99)
Glucose-Capillary: 107 mg/dL — ABNORMAL HIGH (ref 65–99)

## 2016-09-29 NOTE — Patient Care Conference (Signed)
Inpatient RehabilitationTeam Conference and Plan of Care Update Date: 09/29/2016   Time: 10:45 AM    Patient Name: Chad Combs      Medical Record Number: [id number]  Date of Birth: 09-10-1955 Sex: Male         Room/Bed: 4W11C/4W11C-01 Payor Info: Payor: /    Admitting Diagnosis: R CVA  Admit Date/Time:  08/25/2016  2:36 PM Admission Comments: No comment available   Primary Diagnosis:  Acute right MCA stroke (HCC) Principal Problem: Acute right MCA stroke [facility])  Patient Active Problem List   Diagnosis Date Noted  . AAA (abdominal aortic aneurysm) without rupture ([place])   . Acute deep vein thrombosis (DVT) of femoral vein of left lower extremity ([place])   . AAA (abdominal aortic aneurysm) ([place])   . Pulmonary embolus ([place])   . Adjustment disorder   . Acute right MCA stroke ([place]) 08/25/2016  . Left hemiplegia ([place])   . Dysarthria, post-stroke   . Dysphagia, post-stroke   . Occlusion and stenosis of carotid artery   . Diabetes mellitus ([place])   . Benign essential HTN   . Leukocytosis   . Polycythemia vera ([place])   . Tobacco abuse   . Chronic obstructive pulmonary disease ([place])   . CVA (cerebral vascular accident) ([place]) 08/21/2016  . Hypertension 07/04/2015  . Diabetes mellitus without complication ([place]) 94/70/9628  . 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 COPD (chronic obstructive pulmonary disease) ([place]) 07/04/2015    Expected Discharge Date: Expected Discharge Date: 09/21/16  Team Members Present: Physician leading conference: Ilean Skill, PsyD;Dr. Alysia Penna Social Worker Present: Ovidio Kin, LCSW Nurse Present: Heather Roberts, RN PT Present: Barrie Folk, PT;Rosita Dechalus, PTA OT Present: Napoleon Form, OT SLP Present: Stormy Fabian, SLP PPS Coordinator present : Daiva Nakayama, RN, CRRN     Current Status/Progress Goal Weekly Team Focus  Medical   Incont at times, bowel and bladder, problems with family dynamics  improve distractibility,   manage mood   Bowel/Bladder   continent most of the time during the day, condom cath at night time  keep him continent   toilet him more often   Swallow/Nutrition/ Hydration             ADL's   Min A functional transfers; min-mod A standing balance; Supervision- min A UB bathing/dressing  Min A overall  ADL re-training; cognitive re-training; Neuro re-ed   Mobility   Gait up to 47ft maxA with HW, supine to/from sit minA to min guard, sit to/from stand and stand pivot minA to min guard  modA functional transfers, w/c mobilty  safety/awareness, transfers, wt shifting to L    Communication             Safety/Cognition/ Behavioral Observations  Mod A to [place] A for basic problem solving, left inattention and intellectual awareness  Mod A to Min A  completion of basic problem solving tasks, strategies for let inattention and sustained attention   Pain   no complains of pain  less<2  monitor and adress it as need it   Skin   intact  no breakdown  monitor and adress it every shift      *See Care Plan and progress notes for long and short-term goals.  Barriers to Discharge: Left neglect, L eft shoulder sublux    Possible Resolutions to Barriers:  minimize distractions, SNF placement    Discharge Planning/Teaching Needs:  Looking for NH bed son has applied for Medicaid on Monday. MD feels medically stable for  next venue of care      Team Discussion:  Slow gains in therapies. Inconsistent with his function and doing transfer's-needs numerous cues. Mostly continent at times accidents. Neuro-psych following for support. Hemiparetic side becoming painful-MD to address. Medically stable for transfer to NH according to MD  Revisions to Treatment Plan:  NHP   Continued Need for Acute Rehabilitation Level of Care: The patient requires daily medical management by a physician with specialized training in physical medicine and rehabilitation for the following conditions: Daily direction of a multidisciplinary physical  rehabilitation program to ensure safe treatment while eliciting the highest outcome that is of practical value to the patient.: Yes Daily medical management of patient stability for increased activity during participation in an intensive rehabilitation regime.: Yes Daily analysis of laboratory values and/or radiology reports with any subsequent need for medication adjustment of medical intervention for : Pulmonary problems;Neurological problems  Elease Hashimoto 09/30/2016, 9:07 AM

## 2016-09-29 NOTE — Progress Notes (Signed)
Speech Language Pathology Daily Session Note  Patient Details  Name: Chad Combs MRN: [id number] Date of Birth: 1955-12-14  Today's Date: 09/29/2016   Skilled treatment session #1 SLP Individual Time: 1300-1330 SLP Individual Time Calculation (min): 30 min   Skilled treatment session #2 SLP Individual Time: 1435-1535 SLP Individual Time Calculation (min): 60 min  Short Term Goals: Week 6: SLP Short Term Goal 1 (Week 6): Pt will demonstrate sustained attention in moderately distracting environment for ~45 minutes with Min A verbal cues.  SLP Short Term Goal 2 (Week 6): Pt will complete semi-complex problem solving tasks with Mod A verbal cues.  SLP Short Term Goal 3 (Week 6): Pt will scan to left of his environment to locate items within basic, familiar tasks with Mod A verbal cues.  SLP Short Term Goal 4 (Week 6): Pt will identify 2 cognitive and 2 phsycial deficits realted to recent CVA with Mod A verbal cues.   Skilled Therapeutic Interventions:  Skilled treatment session #1 - focused on cognition goals. SLP facilitated session by providing Mod A multimodal cues for completion of previously learned simple card game d/t pt's level of fatigue. Pt left in bed, bed alarm on and all needs within reach.   Skilled treatment session #2 - focused on cognition goals. SLP facilitated session by providing Mod A faded to Min A multimodal cues for completion of simplistic form of Blink card game while music was playing. Pt benefits from having visual cues present (red tape on table) for visual scanning to left. Pt sustained attention with Min A verbal cues for redirection during ~ 45 minutes with music playing. Pt was returned to room, left upright in wheelchair, safety belt donned, chair alarm on and all needs within reach. Continue per current plan of care.      Function:  Eating Eating   Modified Consistency Diet: Yes Eating Assist Level: Helper checks for pocketed food   Eating Set Up  Assist For: Opening containers;Cutting food       Cognition Comprehension Comprehension assist level: Understands basic 90% of the time/cues < 10% of the time  Expression   Expression assist level: Expresses basic needs/ideas: With extra time/assistive device  Social Interaction Social Interaction assist level: Interacts appropriately 90% of the time - Needs monitoring or encouragement for participation or interaction.  Problem Solving Problem solving assist level: Solves basic 90% of the time/requires cueing < 10% of the time  Memory Memory assist level: Recognizes or recalls 90% of the time/requires cueing < 10% of the time    Pain    Therapy/Group: Individual Therapy   Solita Macadam B. [name], M.S., CCC-SLP Speech-Language Pathologist   Chad Combs 09/29/2016, 2:33 PM

## 2016-09-29 NOTE — Progress Notes (Signed)
Occupational Therapy Session Note  Patient Details  Name: Chad Combs MRN: [id number] Date of Birth: Nov 22, 1955  Today's Date: 09/29/2016 OT Individual Time: 3086-5784 and 1345-1430 OT Individual Time Calculation (min): 60 min and 45 min   Short Term Goals: Week 5:  OT Short Term Goal 1 (Week 5): STG=LTG due to awaiting SNF placement  Skilled Therapeutic Interventions/Progress Updates:    Session One: Pt seen for OT ADL bathing/dressing session. Pt sitting up in bed upon arrival with NT present providing supervision for feeding. Pt agreeable to tx session. He transferred to EOB with supervision using hospital bed functions. He transferred throughout session with min A squat pivot w/c <> toilet and w/c <> shower bench. Required VCs throughout for L LE placement awareness. He required min A standing balance with VCs for midline orientation during toileting and LB dressing tasks.  He dressed seated in w/c with cues for hemi technique, decreased carry over of education from previous sessions. Pt left seated in w/c at end of session with chair alarm set.   Session Two: Pt seen for OT session focusing on neuro re-ed of L UE and functional transfers. Pt in supine upon arrival, easily awoken, however, takes increased time and cuing for arousal and to come to EOB. Required assist to power up at trunk, pt able to manage B LEs off EOB.  Pt required increased cuing this session for attention to task during transfers as pt easily distracted by external stimui. Pt participated in animal assisted therapy from w/c level with therapy dog. Initially competed with pt petting dog with R UE. Therapist assisted with max- total A for use of L UE to pet dog with therapist assisting at elbow and wrist. In therapy gym, pt obtained sidelying position and completed scapular and UE ROM in gravity eliminated position. Pt with trace shoulder flexion in this position, required assist for shoulder flexion. He returned to  sitting EOM with supervision. Completed weightbearing task in seated for L UE with pt weight shift R <> L. Cont to educate pt on importance of positioning of hemi arm during seated and bed level positions. Pt self propelled w/c to therapy day room using hemi technique with min cuing for awareness to obstacles on L. Pt left in say room for hand off to SLP.   Therapy Documentation Precautions:  Precautions Precautions: Fall Precaution Comments: L sided neglect, shoulder increased risk for subluxation; quick LOB L wihtout awareness Restrictions Weight Bearing Restrictions: No Pain:   No/ denies pain  See Function Navigator for Current Functional Status.   Therapy/Group: Individual Therapy  Lewis, Cele Mote C 09/29/2016, 7:12 AM

## 2016-09-29 NOTE — Progress Notes (Signed)
Subjective/Complaints:  No issues overnite, depressed affect feels fatigued,   ROS: Denies CP, SOB, N/V/D.    Objective: Vital Signs: Blood pressure 103/66, pulse 71, temperature 97.4 F (36.3 C), temperature source Oral, resp. rate 18, height 5\' 9"  (1.753 m), weight 85.5 kg (188 lb 6.4 oz), SpO2 93 %. No results found. Results for orders placed or performed during the hospital encounter of 08/25/16 (from the past 72 hour(s))  Glucose, capillary     Status: None   Collection Time: 09/26/16 11:59 AM  Result Value Ref Range   Glucose-Capillary 94 65 - 99 mg/dL  Glucose, capillary     Status: Abnormal   Collection Time: 09/26/16  4:47 PM  Result Value Ref Range   Glucose-Capillary 158 (H) 65 - 99 mg/dL  Glucose, capillary     Status: None   Collection Time: 09/26/16  9:21 PM  Result Value Ref Range   Glucose-Capillary 87 65 - 99 mg/dL  Glucose, capillary     Status: None   Collection Time: 09/27/16  6:37 AM  Result Value Ref Range   Glucose-Capillary 96 65 - 99 mg/dL  Glucose, capillary     Status: Abnormal   Collection Time: 09/27/16 12:03 PM  Result Value Ref Range   Glucose-Capillary 108 (H) 65 - 99 mg/dL  Glucose, capillary     Status: Abnormal   Collection Time: 09/27/16  4:20 PM  Result Value Ref Range   Glucose-Capillary 122 (H) 65 - 99 mg/dL  Glucose, capillary     Status: None   Collection Time: 09/27/16 10:18 PM  Result Value Ref Range   Glucose-Capillary 92 65 - 99 mg/dL  Glucose, capillary     Status: Abnormal   Collection Time: 09/28/16  6:49 AM  Result Value Ref Range   Glucose-Capillary 111 (H) 65 - 99 mg/dL  Glucose, capillary     Status: Abnormal   Collection Time: 09/28/16 12:06 PM  Result Value Ref Range   Glucose-Capillary 113 (H) 65 - 99 mg/dL   Comment 1 Notify RN   Glucose, capillary     Status: None   Collection Time: 09/28/16  5:22 PM  Result Value Ref Range   Glucose-Capillary 77 65 - 99 mg/dL   Comment 1 Notify RN   Glucose, capillary      Status: Abnormal   Collection Time: 09/28/16 10:05 PM  Result Value Ref Range   Glucose-Capillary 113 (H) 65 - 99 mg/dL  CBC     Status: Abnormal   Collection Time: 09/29/16  4:47 AM  Result Value Ref Range   WBC 12.1 (H) 4.0 - 10.5 K/uL   RBC 4.84 4.22 - 5.81 MIL/uL   Hemoglobin 15.7 13.0 - 17.0 g/dL   HCT 47.7 39.0 - 52.0 %   MCV 98.6 78.0 - 100.0 fL   MCH 32.4 26.0 - 34.0 pg   MCHC 32.9 30.0 - 36.0 g/dL   RDW 12.6 11.5 - 15.5 %   Platelets 202 150 - 400 K/uL  Glucose, capillary     Status: Abnormal   Collection Time: 09/29/16  7:16 AM  Result Value Ref Range   Glucose-Capillary 125 (H) 65 - 99 mg/dL   Comment 1 Notify RN      HEENT: Normocephalic, atraumatic.  Cardio: RRR. No JVD.  Resp: CTA bilaterally. Unlabored GI: BS +. Soft. Skin:   Intact. Warm and dry Neuro:   Motor is 5/5 in the right deltoid, biceps, triceps, grip, hip flexor, knee extensor, ankle dorsiflexor 2/5 left  biceps and finger flexors, left hip, knee extensor synergy, 1/5 at the ankle Dysarthric, increasing LUE extensor tone Musc/Skel:  No edema. No tenderness.  Gen. no acute distress. Well-developed.   Assessment/Plan: 1. Functional deficits secondary to Right MCA infarct with Left Hemiparesis which require 3+ hours per day of interdisciplinary therapy in a comprehensive inpatient rehab setting. Physiatrist is providing close team supervision and 24 hour management of active medical problems listed below. Physiatrist and rehab team continue to assess barriers to discharge/monitor patient progress toward functional and medical goals. FIM: Function - Bathing Position: Shower Body parts bathed by patient: Left arm, Chest, Abdomen, Front perineal area, Left upper leg, Right arm, Right lower leg, Left lower leg, Right upper leg, Back Body parts bathed by helper: Buttocks Bathing not applicable: Buttocks, Front perineal area Assist Level: Touching or steadying assistance(Pt > 75%)  Function- Upper Body  Dressing/Undressing What is the patient wearing?: Hospital gown Pull over shirt/dress - Perfomed by patient: Thread/unthread right sleeve, Put head through opening, Pull shirt over trunk, Thread/unthread left sleeve Pull over shirt/dress - Perfomed by helper: Thread/unthread left sleeve Button up shirt - Perfomed by patient: Thread/unthread right sleeve, Thread/unthread left sleeve Button up shirt - Perfomed by helper: Pull shirt around back, Button/unbutton shirt Assist Level: Supervision or verbal cues Function - Lower Body Dressing/Undressing Lower body dressing/undressing activity did not occur:  (did not occur with this clinician today) What is the patient wearing?: Pants, Shoes, AFO, Non-skid slipper socks Position: Wheelchair/chair at sink Underwear - Performed by helper: Thread/unthread left underwear leg Pants- Performed by patient: Thread/unthread right pants leg, Thread/unthread left pants leg Pants- Performed by helper: Pull pants up/down Non-skid slipper socks- Performed by patient: Don/doff right sock, Don/doff left sock Non-skid slipper socks- Performed by helper:  (A to stabilize leg in figure 4) Socks - Performed by patient: Don/doff left sock Shoes - Performed by patient: Don/doff right shoe, Don/doff left shoe Shoes - Performed by helper: Don/doff left shoe AFO - Performed by patient: Don/doff left AFO AFO - Performed by helper: Don/doff left AFO Assist for footwear: Partial/moderate assist Assist for lower body dressing: Touching or steadying assistance (Pt > 75%)  Function - Toileting Toileting activity did not occur: No continent bowel/bladder event Toileting steps completed by patient: Adjust clothing prior to toileting, Performs perineal hygiene, Adjust clothing after toileting Toileting steps completed by helper: Adjust clothing prior to toileting, Performs perineal hygiene, Adjust clothing after toileting Toileting Assistive Devices: Grab bar or rail Assist  level: Two helpers  Function - Air cabin crew transfer activity did not occur:  (using urinal or incont., no BM) Toilet transfer assistive device: Mechanical lift Mechanical lift: Stedy Assist level to toilet: 2 helpers Assist level from toilet: 2 helpers  Function - Chair/bed transfer Chair/bed transfer method: Other (stedy lift ) Chair/bed transfer assist level: 2 helpers Chair/bed transfer assistive device: Other (stedy lift ) Mechanical lift: Stedy Chair/bed transfer details: Manual facilitation for weight shifting, Verbal cues for safe use of DME/AE, Verbal cues for technique  Function - Locomotion: Wheelchair Will patient use wheelchair at discharge?: Yes Type: Manual Max wheelchair distance: 150 Assist Level: Supervision or verbal cues Assist Level: Supervision or verbal cues Assist Level: Supervision or verbal cues Turns around,maneuvers to table,bed, and toilet,negotiates 3% grade,maneuvers on rugs and over doorsills: No Function - Locomotion: Ambulation Ambulation activity did not occur: Safety/medical concerns (pt pushes toward hemi side; unsafe) Assistive device: Walker-hemi, Orthosis Max distance: 20 Assist level: Maximal assist (Pt 25 - 49%) Walk  10 feet activity did not occur: Safety/medical concerns Assist level: Maximal assist (Pt 25 - 49%) Walk 50 feet with 2 turns activity did not occur: Safety/medical concerns Walk 150 feet activity did not occur: Safety/medical concerns Walk 10 feet on uneven surfaces activity did not occur: Safety/medical concerns  Function - Comprehension Comprehension: Auditory Comprehension assist level: Understands basic 90% of the time/cues < 10% of the time  Function - Expression Expression: Verbal Expression assist level: Expresses basic needs/ideas: With extra time/assistive device  Function - Social Interaction Social Interaction assist level: Interacts appropriately 90% of the time - Needs monitoring or  encouragement for participation or interaction.  Function - Problem Solving Problem solving assist level: Solves basic 90% of the time/requires cueing < 10% of the time  Function - Memory Memory assist level: Recognizes or recalls 90% of the time/requires cueing < 10% of the time Patient normally able to recall (first 3 days only): Current season, Location of own room, Staff names and faces, That he or she is in a hospital  Medical Problem List and Plan: 1.  Left hemiplegia, limitations with self-care, difficulty with transfers secondary to right MCA CVA.  Cont CIR PT, OT, SLP-   Stable for SNF placement 2.  DVT Prophylaxis/Anticoagulation: Pharmaceutical: Xarelto for PE, expect at least 3 mo tx 3. Pain Management: Left hip/groin pain Improved no sig degenerative changes on Xray,4. Mood: Team to provide ego support.  LCSW to follow for evaluation and support.  5. Neuropsych: This patient is capable of making decisions on his own behalf. 6. Skin/Wound Care: routine pressure relief measures.  7. Fluids/Electrolytes/Nutrition: Monitor I/O. Encourage fluid intake.  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 9. T2DM: Hgb A1C- 10.5 --. Continue metformin 1000mg  bid. Was started on lantus  45 units daily at bedtime, with SSI for elevated BS, consulted RD for diabetic education and start education on insulin administration.  Will monitor BS ac/hs   CBG (last 3) controlled 4/10  Recent Labs  09/28/16 1722 09/28/16 2205 09/29/16 0716  GLUCAP 77 113* 125*    10.  Acute/subacute right lower lobe pulmonary embolism.asymptomatic  Xarelto,  neurologically stable 11. HTN: Monitor BP bid--on lisinopril 20mg  daily  Controlled 4/10 Vitals:   09/28/16 1655 09/29/16 0639  BP: 131/73 103/66  Pulse: 90 71  Resp: 18 18  Temp: 97.6 F (36.4 C) 97.4 F (36.3 C)    12. Leucocytosis: Monitor for signs of infection. UA negative.  Afebrile,inproving WBC 11.6 on 4/4 13. Polycythemia: Smokes 1.5 PPD. Expect this to remain elevated during hospitalization, 14. COPD: SOB reported--CXR negative. Encourage tobacco cessation.  Managed without medications at this time.    15. Tobacco abuse: Counsel 16. Hypoalbuminemia, start pro-stat 17 Aortic aneursym infrarenal 6.8cm---will keep sys BPs on lower side 100-120s   Conservative mgt for now---. Dr. Donzetta Matters will follow up with patient in office once he's further recovered from CVA 18. Tremor improved off gabapentin LOS (Days) 35 A FACE TO FACE EVALUATION WAS PERFORMED  KIRSTEINS,ANDREW E 09/29/2016, 10:22 AM

## 2016-09-29 NOTE — Progress Notes (Signed)
Physical Therapy Session Note  Patient Details  Name: Chad Combs MRN: [id number] Date of Birth: April 07, 1956  Today's Date: 09/29/2016 PT Individual Time: 1100-1157 PT Individual Time Calculation (min): 57 min   Short Term Goals: Week 5:  PT Short Term Goal 1 (Week 5): STG=LTG due to ELOS  Skilled Therapeutic Interventions/Progress Updates:   WC mobility 272f with supervision assist and min cues for safety in doorways to prevent hitting LLE secondary to L inattention.   Blocked practice transfer training with min assist progressing to close supervision assist from PT. Constant cues for proper Set up and safety in transfer required from PT as well as improved head/hips relationship.  PT adjusted wheel locks on WC to improved safety with transfers.   Blocked practice Bed mobility sit<>supine with min-mod assist to control the LLE. Min cues for sequencing and safety awareness when close to EOB.   Gait training with HW x 261f with 3 turns and max assist overall to progress LLE. Pt noted to be able to progress the LLE 5 % of the time with only mod assist from PT and maintain neutral L hip rotation.   Pt returned to room and performed squat pivot transfer to bed with min . Sit>supine completed with Min assist to manage the LLE and left supine in bed with call bell in reach and all needs met.             Therapy Documentation Precautions:  Precautions Precautions: Fall Precaution Comments: L sided neglect, shoulder increased risk for subluxation; quick LOB L wihtout awareness Restrictions Weight Bearing Restrictions: No General:   Vital Signs: Therapy Vitals Pulse Rate: (!) 102 Resp: 16 Patient Position (if appropriate): Lying Oxygen Therapy SpO2: 93 % O2 Device: Not Delivered Pain:   4/10 in L shoulder. PT applied givmohr sling to LUE and pain decreased to 1/10.   See Function Navigator for Current Functional Status.   Therapy/Group: Individual Therapy  AuLorie Phenix/04/2017, 2:33 PM

## 2016-09-30 ENCOUNTER — Ambulatory Visit (HOSPITAL_COMMUNITY): Payer: Self-pay | Admitting: Physical Therapy

## 2016-09-30 ENCOUNTER — Inpatient Hospital Stay (HOSPITAL_COMMUNITY): Payer: Self-pay | Admitting: Physical Therapy

## 2016-09-30 ENCOUNTER — Inpatient Hospital Stay (HOSPITAL_COMMUNITY): Payer: Self-pay | Admitting: Occupational Therapy

## 2016-09-30 ENCOUNTER — Inpatient Hospital Stay (HOSPITAL_COMMUNITY): Payer: Self-pay | Admitting: Speech Pathology

## 2016-09-30 LAB — GLUCOSE, CAPILLARY
GLUCOSE-CAPILLARY: 117 mg/dL — AB (ref 65–99)
GLUCOSE-CAPILLARY: 155 mg/dL — AB (ref 65–99)
Glucose-Capillary: 94 mg/dL (ref 65–99)
Glucose-Capillary: 89 mg/dL (ref 65–99)

## 2016-09-30 NOTE — Progress Notes (Signed)
Speech Language Pathology Daily Session Note  Patient Details  Name: Chad Combs MRN: [id number] Date of Birth: 1956/02/28  Today's Date: 09/30/2016 SLP Individual Time: 0900-1000 SLP Individual Time Calculation (min): 60 min  Short Term Goals: Week 6: SLP Short Term Goal 1 (Week 6): Pt will demonstrate sustained attention in moderately distracting environment for ~45 minutes with Min A verbal cues.  SLP Short Term Goal 2 (Week 6): Pt will complete semi-complex problem solving tasks with Mod A verbal cues.  SLP Short Term Goal 3 (Week 6): Pt will scan to left of his environment to locate items within basic, familiar tasks with Mod A verbal cues.  SLP Short Term Goal 4 (Week 6): Pt will identify 2 cognitive and 2 phsycial deficits realted to recent CVA with Mod A verbal cues.   Skilled Therapeutic Interventions: Skilled treatment session focused on cognition goals. SLP facilitated session by providing mild distractions (music) during single rule checkers game. Pt required Mod A to implement appropriate moves in game. Pt was able to sustain attention for ~45 minutes with Min A verbal cues as well as scan to his left. Pt was returned to room, left upright in wheelchair, safety belt donned, chair alarm on and connected to nurse call bell and all needs within reach. Continue per current plan of care.      Function:    Cognition Comprehension Comprehension assist level: Understands basic 90% of the time/cues < 10% of the time  Expression   Expression assist level: Expresses basic needs/ideas: With extra time/assistive device  Social Interaction Social Interaction assist level: Interacts appropriately 90% of the time - Needs monitoring or encouragement for participation or interaction.  Problem Solving Problem solving assist level: Solves basic 75 - 89% of the time/requires cueing 10 - 24% of the time  Memory Memory assist level: Recognizes or recalls 90% of the time/requires cueing < 10% of the  time    Pain    Therapy/Group: Individual Therapy   Zitlaly Malson B. [name], M.S., CCC-SLP Speech-Language Pathologist   [name] 09/30/2016, 12:18 PM

## 2016-09-30 NOTE — Progress Notes (Signed)
Social Work Patient ID: Chad Combs, male   DOB: March 27, 1956, 61 y.o.   MRN: [id number] Spoke with son who has applied for Medicaid and have given information to [employer] of Social Work. Will pursue [facility] bed for pt to discharge to. Pt is aware of this plan.

## 2016-09-30 NOTE — Progress Notes (Signed)
Occupational Therapy Session Note  Patient Details  Name: Chad Combs MRN: [id number] Date of Birth: Sep 05, 1955  Today's Date: 09/30/2016 OT Individual Time: 1045-1200 OT Individual Time Calculation (min): 75 min    Short Term Goals: Week 5:  OT Short Term Goal 1 (Week 5): STG=LTG due to awaiting SNF placement  Skilled Therapeutic Interventions/Progress Updates:    Pt seen for OT session for co-tx with recreational therapist. Pt sitting up in w/c upon arrival, encouragement for participation, but willing to work. He completed grooming tasks seated in w/c at sink with set-up assist. Hand over hand assist provided for use of L UE to wash R UE. Pt taken outside total A in w/c for change of sensory/ emotional lift. Pt participated in table top activity while weightbearing through L UE. Pt required to retrieve playing card placed on left. He demonstrated poor abstract thinking during activity, requiring VCs for problem solving. He returned to unit and completed squat pivot transfer to therapy mat with mod cuing for proper set-up of w/c and close supervision. Completed weightbearing/ weight-shifting activity seated EOM, required to weight bear through L UE when reaching across midline to obtain item. He then completed task required to laterally lean onto L forearm to obtain item and return to midline. Initially required mod-max to return to midline, progressing to min-mod. Pt self propelled w/c back to room at end of session, encouraged to sit up in w/c until finished with lunch, left with all needs in reach and QRB donned and chair alarm activated.   Therapy Documentation Precautions:  Precautions Precautions: Fall Precaution Comments: L sided neglect, shoulder increased risk for subluxation; quick LOB L wihtout awareness Restrictions Weight Bearing Restrictions: No Pain:   No/ denies pain  See Function Navigator for Current Functional Status.   Therapy/Group: Individual Therapy  Lewis,  Kip Cropp C 09/30/2016, 7:29 AM

## 2016-09-30 NOTE — Progress Notes (Signed)
Physical Therapy Session Note  Patient Details  Name: Chad Combs MRN: [id number] Date of Birth: Mar 20, 1956  Today's Date: 09/30/2016 PT Individual Time: 7902-4097 AND 1400-1430 PT Individual Time Calculation (min): 27 min AND 30 min   Short Term Goals: Week 5:  PT Short Term Goal 1 (Week 5): STG=LTG due to ELOS  Skilled Therapeutic Interventions/Progress Updates:   Pt received supine in bed and agreeable to PT. Supine>sit transfer with close supervision assist and moderate cues for sequencing and safety. Heavy use of bed rails to come to sitting as well as slight HOB elevation.   Squat pivot transfer transfer to [facility] with close supervision assist from PT to the R. PT required to cue pt on head/hips relationship. PT assisted pt to don shoes and L AFO sitting in WC>   Toilet transfer With min assist from PT using rail in bathroom continent bladder evacuation. PT required to manage clothing following transfer.   WC mobility in hall x 131f with supervision assist to improve awareness of the LLE and prevent hitting doors on the L.   [facility] and [facility] needs met.     Session 2.   Pt received supine in bed and agreeable to PT. Supine>sit transfer with close superviion assist assist and max cues for sequencing and heavy use of rails.   PT performed PROM stretching to Hip abductors and HS in the LLE 3 bouts x 1 min each.   Squat pivot transfer with min assist from  PT to the L with cues for set up and safety.   Pt noted to have incontinent bladder evacuation following WC mobility  Of 1582fwith supervision assist using hemi technique.   Ambulatory transfer to EOB with max assist from PT using HW. PT required to advance the LLE 90% of the time and provide tactile cues for safety to prevent knee buckling.   Pt instructed in clothing management with sit<>stand with min-mod assist from PT. Perineal hygiene perfermed by pt in  standing with min -mod assist from PT for safety.   Pt returned to supine with min assist from PT to manage LLE. Pt left with bell in place and all needs met.          Therapy Documentation Precautions:  Precautions Precautions: Fall Precaution Comments: L sided neglect, shoulder increased risk for subluxation; quick LOB L wihtout awareness Restrictions Weight Bearing Restrictions: No   See Function Navigator for Current Functional Status.   Therapy/Group: Individual Therapy  AuLorie Phenix/05/2017, 8:30 AM

## 2016-09-30 NOTE — Progress Notes (Signed)
Social Work Patient ID: RAMAR NOBREGA, male   DOB: 11/16/1955, 61 y.o.   MRN: [id number] Met with pt and spoke with son regarding bed offer from [facility] with plan to transfer tomorrow if can get LOG confirmation from management. Pt and son are agreeable to this plan. Son to set up paperwork with facility in case of transfer. Will await management confirmation. Tammy-Liaison from facilityhere to visit with pt and answer his questions.

## 2016-09-30 NOTE — NC FL2 (Signed)
[place] LEVEL OF CARE SCREENING TOOL     IDENTIFICATION  Patient Name: Chad Combs Birthdate: 12-02-1955 Sex: male Admission Date (Current Location): 08/25/2016  [facility] and [place] Number:  Engineering geologist and Address:  [facility], [place] [address], [place], [place] [address]      Provider Number: [id number]  Attending Physician Name and Address:  Charlett Blake, MD  Relative Name and Phone Number:  Nivin Braniff [phone]    Current Level of Care: Other (Comment) (rehab) Recommended Level of Care: [place] Prior Approval Number:    Date Approved/Denied:   PASRR Number: [id number]  Discharge Plan: SNF    Current Diagnoses: Patient Active Problem List   Diagnosis Date Noted  . AAA (abdominal aortic aneurysm) without rupture ([place])   . Acute deep vein thrombosis (DVT) of femoral vein of left lower extremity ([place])   . AAA (abdominal aortic aneurysm) ([place])   . Pulmonary embolus ([place])   . Adjustment disorder   . Acute right MCA stroke ([place]) 08/25/2016  . Left hemiplegia ([place])   . Dysarthria, post-stroke   . Dysphagia, post-stroke   . Occlusion and stenosis of carotid artery   . Diabetes mellitus ([place])   . Benign essential HTN   . Leukocytosis   . Polycythemia vera ([place])   . Tobacco abuse   . Chronic obstructive pulmonary disease ([place])   . CVA (cerebral vascular accident) ([place]) 08/21/2016  . Hypertension 07/04/2015  . Diabetes mellitus without complication ([employer]) 03/00/9233  . 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 COPD (chronic obstructive pulmonary disease) ([place]) 07/04/2015    Orientation RESPIRATION BLADDER Height & Weight     Self, Situation, Place  Normal Incontinent (Accidents at times) Weight: 188 lb 6.4 oz (85.5 kg) Height:  5' 9"  (175.3 cm)  BEHAVIORAL SYMPTOMS/MOOD NEUROLOGICAL BOWEL NUTRITION STATUS      Continent Diet (Dys 3 thin liquids)  AMBULATORY STATUS COMMUNICATION OF NEEDS Skin    Extensive Assist Verbally Normal                       Personal Care Assistance Level of Assistance  Bathing, Feeding, Dressing Bathing Assistance: Limited assistance Feeding assistance: Limited assistance (requires supervision with meals) Dressing Assistance: Limited assistance     Functional Limitations Info  Sight, Speech Sight Info: Impaired   Speech Info: Impaired    SPECIAL CARE FACTORS FREQUENCY  PT (By licensed PT), OT (By licensed OT), Speech therapy     PT Frequency: 5x week OT Frequency: 5 x week Bowel and Bladder Program Frequency: Timed toileting for bladder   Speech Therapy Frequency: 5 x week      Contractures Contractures Info: Not present    Additional Factors Info  Psychotropic               Current Medications (09/30/2016):  This is the current hospital active medication list Current Facility-Administered Medications  Medication Dose Route Frequency Provider Last Rate Last Dose  . acetaminophen (TYLENOL) tablet 325-650 mg  325-650 mg Oral Q4H PRN Bary Leriche, PA-C   650 mg at 09/22/16 2057  . alum & mag hydroxide-simeth (MAALOX/MYLANTA) 200-200-20 MG/5ML suspension 30 mL  30 mL Oral Q4H PRN Ivan Anchors Love, PA-C   30 mL at 09/19/16 0011  . atorvastatin (LIPITOR) tablet 40 mg  40 mg Oral q1800 Ivan Anchors Love, PA-C   40 mg at 09/29/16 1734  . bisacodyl (DULCOLAX) suppository 10  mg  10 mg Rectal Daily PRN Ivan Anchors Love, PA-C      . diphenhydrAMINE (BENADRYL) 12.5 MG/5ML elixir 12.5-25 mg  12.5-25 mg Oral Q6H PRN Bary Leriche, PA-C      . ezetimibe (ZETIA) tablet 10 mg  10 mg Oral q1800 Rosalin Hawking, MD   10 mg at 09/28/16 1748  . famotidine (PEPCID) tablet 40 mg  40 mg Oral BID Charlett Blake, MD   40 mg at 09/29/16 2055  . feeding supplement (PRO-STAT SUGAR FREE 64) liquid 30 mL  30 mL Oral BID Charlett Blake, MD   30 mL at 09/29/16 2054  . fluticasone (FLONASE) 50 MCG/ACT nasal spray 1 spray  1 spray Each Nare Daily Bary Leriche, PA-C    1 spray at 09/29/16 5681  . guaiFENesin-dextromethorphan (ROBITUSSIN DM) 100-10 MG/5ML syrup 5-10 mL  5-10 mL Oral Q6H PRN Bary Leriche, PA-C      . insulin aspart (novoLOG) injection 0-20 Units  0-20 Units Subcutaneous TID WC & HS Bary Leriche, PA-C   3 Units at 09/29/16 2318  . insulin glargine (LANTUS) injection 45 Units  45 Units Subcutaneous Daily Charlett Blake, MD   45 Units at 09/29/16 [phone]  . insulin starter kit- pen needles (English) 1 kit  1 kit Other Once Charlett Blake, MD      . lisinopril (PRINIVIL,ZESTRIL) tablet 20 mg  20 mg Oral Daily Charlett Blake, MD   20 mg at 09/28/16 0849  . metFORMIN (GLUCOPHAGE) tablet 1,000 mg  1,000 mg Oral BID WC Ivan Anchors Love, PA-C   1,000 mg at 09/29/16 1734  . mometasone-formoterol (DULERA) 100-5 MCG/ACT inhaler 2 puff  2 puff Inhalation BID Bary Leriche, PA-C   2 puff at 09/30/16 0831  . nicotine (NICODERM CQ - dosed in mg/24 hours) patch 14 mg  14 mg Transdermal Daily Charlett Blake, MD   14 mg at 09/30/16 0550  . polyethylene glycol (MIRALAX / GLYCOLAX) packet 17 g  17 g Oral Daily PRN Bary Leriche, PA-C   17 g at 09/18/16 1751  . prochlorperazine (COMPAZINE) tablet 5-10 mg  5-10 mg Oral Q6H PRN Bary Leriche, PA-C   5 mg at 09/16/16 7001   Or  . prochlorperazine (COMPAZINE) injection 5-10 mg  5-10 mg Intramuscular Q6H PRN Bary Leriche, PA-C       Or  . prochlorperazine (COMPAZINE) suppository 12.5 mg  12.5 mg Rectal Q6H PRN Bary Leriche, PA-C      . Rivaroxaban (XARELTO) tablet 15 mg  15 mg Oral BID WC Charlett Blake, MD   15 mg at 09/29/16 1734   Followed by  . [START ON 10/08/2016] rivaroxaban (XARELTO) tablet 20 mg  20 mg Oral Q supper Charlett Blake, MD      . senna-docusate (Senokot-S) tablet 1 tablet  1 tablet Oral QHS PRN Bary Leriche, PA-C   1 tablet at 09/26/16 1226  . senna-docusate (Senokot-S) tablet 1 tablet  1 tablet Oral QHS Charlett Blake, MD   1 tablet at 09/29/16 2054  . sodium phosphate  (FLEET) 7-19 GM/118ML enema 1 enema  1 enema Rectal Once PRN Bary Leriche, PA-C      . tamsulosin (FLOMAX) capsule 0.4 mg  0.4 mg Oral QPC supper Charlett Blake, MD   0.4 mg at 09/29/16 1734  . tiZANidine (ZANAFLEX) tablet 2 mg  2 mg Oral QHS Charlett Blake, MD  2 mg at 09/29/16 2057  . traZODone (DESYREL) tablet 25-50 mg  25-50 mg Oral QHS PRN Bary Leriche, PA-C   50 mg at 09/29/16 2054     Discharge Medications: Please see discharge summary for a list of discharge medications.  Relevant Imaging Results:  Relevant Lab Results:   Additional Information requires too much care to be managed at home  Nehal Shives, [name], LCSW

## 2016-09-30 NOTE — Progress Notes (Signed)
Recreational Therapy Session Note  Patient Details  Name: Chad Combs MRN: [id number] Date of Birth: [birth year] Today's Date: 09/30/2016  Pain: no c/o Skilled Therapeutic Interventions/Progress Updates: Session focused on selective attention, attending to the left, speech intellieability at conversational level during co-treat with OT.  Pt taken outside w/c level with Total assist for time management as pt enjoys being outdoors.  Once outside, pt visually scanning to the left with verbal reminders to locate cards for "Would you rather" game.  Pt read card aloud and answered questions with question cues for problem solving/explanation.  Pt used LEU as a stabilizer to prevent played cards from blowing in the wind during game play.  Pt stated he enjoyed the warmth of the sun.  Therapy/Group: Co-Treatment  Lorren Splawn 09/30/2016, 11:41 AM

## 2016-09-30 NOTE — Progress Notes (Signed)
Subjective/Complaints:  Patient is without new complaints today. Appetite is good  ROS: Denies CP, SOB, N/V/D.    Objective: Vital Signs: Blood pressure 131/81, pulse 84, temperature 97.8 F (36.6 C), temperature source Oral, resp. rate 18, height 5\' 9"  (1.753 m), weight 85.5 kg (188 lb 6.4 oz), SpO2 93 %. No results found. Results for orders placed or performed during the hospital encounter of 08/25/16 (from the past 72 hour(s))  Glucose, capillary     Status: Abnormal   Collection Time: 09/27/16 12:03 PM  Result Value Ref Range   Glucose-Capillary 108 (H) 65 - 99 mg/dL  Glucose, capillary     Status: Abnormal   Collection Time: 09/27/16  4:20 PM  Result Value Ref Range   Glucose-Capillary 122 (H) 65 - 99 mg/dL  Glucose, capillary     Status: None   Collection Time: 09/27/16 10:18 PM  Result Value Ref Range   Glucose-Capillary 92 65 - 99 mg/dL  Glucose, capillary     Status: Abnormal   Collection Time: 09/28/16  6:49 AM  Result Value Ref Range   Glucose-Capillary 111 (H) 65 - 99 mg/dL  Glucose, capillary     Status: Abnormal   Collection Time: 09/28/16 12:06 PM  Result Value Ref Range   Glucose-Capillary 113 (H) 65 - 99 mg/dL   Comment 1 Notify RN   Glucose, capillary     Status: None   Collection Time: 09/28/16  5:22 PM  Result Value Ref Range   Glucose-Capillary 77 65 - 99 mg/dL   Comment 1 Notify RN   Glucose, capillary     Status: Abnormal   Collection Time: 09/28/16 10:05 PM  Result Value Ref Range   Glucose-Capillary 113 (H) 65 - 99 mg/dL  CBC     Status: Abnormal   Collection Time: 09/29/16  4:47 AM  Result Value Ref Range   WBC 12.1 (H) 4.0 - 10.5 K/uL   RBC 4.84 4.22 - 5.81 MIL/uL   Hemoglobin 15.7 13.0 - 17.0 g/dL   HCT 47.7 39.0 - 52.0 %   MCV 98.6 78.0 - 100.0 fL   MCH 32.4 26.0 - 34.0 pg   MCHC 32.9 30.0 - 36.0 g/dL   RDW 12.6 11.5 - 15.5 %   Platelets 202 150 - 400 K/uL  Glucose, capillary     Status: Abnormal   Collection Time: 09/29/16  7:16  AM  Result Value Ref Range   Glucose-Capillary 125 (H) 65 - 99 mg/dL   Comment 1 Notify RN   Glucose, capillary     Status: Abnormal   Collection Time: 09/29/16 11:54 AM  Result Value Ref Range   Glucose-Capillary 132 (H) 65 - 99 mg/dL  Glucose, capillary     Status: Abnormal   Collection Time: 09/29/16  4:57 PM  Result Value Ref Range   Glucose-Capillary 107 (H) 65 - 99 mg/dL  Glucose, capillary     Status: Abnormal   Collection Time: 09/29/16 10:14 PM  Result Value Ref Range   Glucose-Capillary 131 (H) 65 - 99 mg/dL  Glucose, capillary     Status: Abnormal   Collection Time: 09/30/16  6:31 AM  Result Value Ref Range   Glucose-Capillary 117 (H) 65 - 99 mg/dL     HEENT: Normocephalic, atraumatic.  Cardio: RRR. No JVD.  Resp: CTA bilaterally. Unlabored GI: BS +. Soft. Skin:   Intact. Warm and dry Neuro:   Motor is 5/5 in the right deltoid, biceps, triceps, grip, hip flexor, knee extensor, ankle  dorsiflexor 2/5 left biceps and finger flexors, left hip, knee extensor synergy, 1/5 at the ankle Dysarthric, increasing LUE extensor tone Musc/Skel:  No edema. No tenderness.  Gen. no acute distress. Well-developed.   Assessment/Plan: 1. Functional deficits secondary to Right MCA infarct with Left Hemiparesis which require 3+ hours per day of interdisciplinary therapy in a comprehensive inpatient rehab setting. Physiatrist is providing close team supervision and 24 hour management of active medical problems listed below. Physiatrist and rehab team continue to assess barriers to discharge/monitor patient progress toward functional and medical goals. FIM: Function - Bathing Position: Shower Body parts bathed by patient: Left arm, Chest, Abdomen, Front perineal area, Left upper leg, Right arm, Right lower leg, Left lower leg, Right upper leg, Back Body parts bathed by helper: Buttocks Bathing not applicable: Buttocks, Front perineal area Assist Level: Touching or steadying  assistance(Pt > 75%)  Function- Upper Body Dressing/Undressing What is the patient wearing?: Hospital gown Pull over shirt/dress - Perfomed by patient: Thread/unthread right sleeve, Put head through opening, Pull shirt over trunk, Thread/unthread left sleeve Pull over shirt/dress - Perfomed by helper: Thread/unthread left sleeve Button up shirt - Perfomed by patient: Thread/unthread right sleeve, Thread/unthread left sleeve Button up shirt - Perfomed by helper: Pull shirt around back, Button/unbutton shirt Assist Level: Supervision or verbal cues Function - Lower Body Dressing/Undressing Lower body dressing/undressing activity did not occur:  (did not occur with this clinician today) What is the patient wearing?: Pants, Shoes, AFO, Non-skid slipper socks Position: Wheelchair/chair at sink Underwear - Performed by helper: Thread/unthread left underwear leg Pants- Performed by patient: Thread/unthread right pants leg, Thread/unthread left pants leg Pants- Performed by helper: Pull pants up/down Non-skid slipper socks- Performed by patient: Don/doff right sock, Don/doff left sock Non-skid slipper socks- Performed by helper:  (A to stabilize leg in figure 4) Socks - Performed by patient: Don/doff left sock Shoes - Performed by patient: Don/doff right shoe, Don/doff left shoe Shoes - Performed by helper: Don/doff left shoe AFO - Performed by patient: Don/doff left AFO AFO - Performed by helper: Don/doff left AFO Assist for footwear: Partial/moderate assist Assist for lower body dressing: Touching or steadying assistance (Pt > 75%)  Function - Toileting Toileting activity did not occur: No continent bowel/bladder event Toileting steps completed by patient: Adjust clothing prior to toileting, Performs perineal hygiene, Adjust clothing after toileting Toileting steps completed by helper: Adjust clothing prior to toileting, Performs perineal hygiene, Adjust clothing after toileting Toileting  Assistive Devices: Grab bar or rail Assist level: Two helpers  Function - Air cabin crew transfer activity did not occur:  (using urinal or incont., no BM) Toilet transfer assistive device: Mechanical lift Mechanical lift: Stedy Assist level to toilet: 2 helpers Assist level from toilet: 2 helpers  Function - Chair/bed transfer Chair/bed transfer method: Other (stedy ) Chair/bed transfer assist level: 2 helpers Chair/bed transfer assistive device: Other (stedy lift ) Mechanical lift: Stedy Chair/bed transfer details: Manual facilitation for weight shifting, Verbal cues for safe use of DME/AE, Verbal cues for technique  Function - Locomotion: Wheelchair Will patient use wheelchair at discharge?: Yes Type: Manual Max wheelchair distance: 150 Assist Level: Supervision or verbal cues Assist Level: Supervision or verbal cues Assist Level: Supervision or verbal cues Turns around,maneuvers to table,bed, and toilet,negotiates 3% grade,maneuvers on rugs and over doorsills: No Function - Locomotion: Ambulation Ambulation activity did not occur: Safety/medical concerns (pt pushes toward hemi side; unsafe) Assistive device: Walker-hemi, Orthosis Max distance: 20 Assist level: Maximal assist (Pt 25 -  49%) Walk 10 feet activity did not occur: Safety/medical concerns Assist level: Maximal assist (Pt 25 - 49%) Walk 50 feet with 2 turns activity did not occur: Safety/medical concerns Walk 150 feet activity did not occur: Safety/medical concerns Walk 10 feet on uneven surfaces activity did not occur: Safety/medical concerns  Function - Comprehension Comprehension: Auditory Comprehension assist level: Understands basic 90% of the time/cues < 10% of the time  Function - Expression Expression: Verbal Expression assist level: Expresses basic needs/ideas: With extra time/assistive device  Function - Social Interaction Social Interaction assist level: Interacts appropriately 90% of the  time - Needs monitoring or encouragement for participation or interaction.  Function - Problem Solving Problem solving assist level: Solves basic 90% of the time/requires cueing < 10% of the time  Function - Memory Memory assist level: Recognizes or recalls 90% of the time/requires cueing < 10% of the time Patient normally able to recall (first 3 days only): Current season, Location of own room, Staff names and faces, That he or she is in a hospital  Medical Problem List and Plan: 1.  Left hemiplegia, limitations with self-care, difficulty with transfers secondary to right MCA CVA.  Cont CIR PT, OT, SLP- awaiting skilled nursing placement   2.  DVT Prophylaxis/Anticoagulation: Pharmaceutical: Xarelto for PE, expect at least 3 mo tx 3. Pain Management: Left hip/groin pain Improved no sig degenerative changes on Xray,4. Mood: Team to provide ego support.  LCSW to follow for evaluation and support.  5. Neuropsych: This patient is capable of making decisions on his own behalf. 6. Skin/Wound Care: routine pressure relief measures.  7. Fluids/Electrolytes/Nutrition: Monitor I/O. Encourage fluid intake.  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 9. T2DM: Hgb A1C- 10.5 --. Continue metformin 1000mg  bid. Was started on lantus  45 units daily at bedtime, with SSI for elevated BS, consulted RD for diabetic education and start education on insulin administration.  Will monitor BS ac/hs   CBG (last 3) controlled 4/12  Recent Labs  09/29/16 1657 09/29/16 2214 09/30/16 0631  GLUCAP 107* 131* 117*    10.  Acute/subacute right lower lobe pulmonary embolism.asymptomatic  Xarelto,  neurologically stable 11. HTN: Monitor BP bid--on lisinopril 20mg  daily  Controlled 4/12 Vitals:   09/29/16 2047 09/30/16 0415  BP:  131/81  Pulse: 89 84  Resp: 18 18  Temp:  97.8 F (36.6 C)    12. Leucocytosis: Monitor for signs of infection. UA  negative. Afebrile,inproving WBC 11.6 on 4/4 13. Polycythemia: Smokes 1.5 PPD. Expect this to remain elevated during hospitalization, 14. COPD: SOB reported--CXR negative. Encourage tobacco cessation.  Managed without medications at this time.    15. Tobacco abuse: Counsel 16. Hypoalbuminemia, start pro-stat 17 Aortic aneursym infrarenal 6.8cm---will keep sys BPs on lower side 100-120s   Conservative mgt for now---. Dr. Donzetta Matters will follow up with patient in office once he's further recovered from CVA  LOS (Days) 36 A FACE TO [place] E 09/30/2016, 6:51 AM

## 2016-10-01 ENCOUNTER — Inpatient Hospital Stay (HOSPITAL_COMMUNITY): Payer: Self-pay | Admitting: Physical Therapy

## 2016-10-01 ENCOUNTER — Ambulatory Visit (HOSPITAL_COMMUNITY): Payer: Self-pay | Admitting: Physical Therapy

## 2016-10-01 ENCOUNTER — Inpatient Hospital Stay (HOSPITAL_COMMUNITY): Payer: Self-pay | Admitting: Speech Pathology

## 2016-10-01 ENCOUNTER — Inpatient Hospital Stay (HOSPITAL_COMMUNITY): Payer: Self-pay | Admitting: Occupational Therapy

## 2016-10-01 LAB — GLUCOSE, CAPILLARY
Glucose-Capillary: 107 mg/dL — ABNORMAL HIGH (ref 65–99)
Glucose-Capillary: 149 mg/dL — ABNORMAL HIGH (ref 65–99)

## 2016-10-01 MED ORDER — INSULIN GLARGINE 100 UNIT/ML ~~LOC~~ SOLN: 45.0000 [IU] | Freq: Every day | SUBCUTANEOUS | 11 refills | Status: DC

## 2016-10-01 MED ORDER — NICOTINE 14 MG/24HR TD PT24: 14.0000 mg | MEDICATED_PATCH | Freq: Every day | TRANSDERMAL | 0 refills | Status: DC

## 2016-10-01 MED ORDER — SENNOSIDES-DOCUSATE SODIUM 8.6-50 MG PO TABS: 1.0000 | ORAL_TABLET | Freq: Every day | ORAL | Status: DC

## 2016-10-01 MED ORDER — RIVAROXABAN 15 MG PO TABS: 15.0000 mg | ORAL_TABLET | Freq: Two times a day (BID) | ORAL | Status: DC

## 2016-10-01 MED ORDER — RIVAROXABAN 20 MG PO TABS: 20.0000 mg | ORAL_TABLET | Freq: Every day | ORAL | Status: DC

## 2016-10-01 MED ORDER — EZETIMIBE 10 MG PO TABS: 10.0000 mg | ORAL_TABLET | Freq: Every day | ORAL | Status: AC

## 2016-10-01 MED ORDER — TIZANIDINE HCL 2 MG PO TABS: 2.0000 mg | ORAL_TABLET | Freq: Every day | ORAL | 0 refills | Status: DC

## 2016-10-01 MED ORDER — TAMSULOSIN HCL 0.4 MG PO CAPS: 0.4000 mg | ORAL_CAPSULE | Freq: Every day | ORAL | Status: AC

## 2016-10-01 MED ORDER — FAMOTIDINE 40 MG PO TABS: 40.0000 mg | ORAL_TABLET | Freq: Two times a day (BID) | ORAL | Status: DC

## 2016-10-01 NOTE — Progress Notes (Signed)
Subjective/Complaints:  No issues overnite  ROS: Denies CP, SOB, N/V/D.    Objective: Vital Signs: Blood pressure 133/79, pulse 75, temperature 98.2 F (36.8 C), temperature source Oral, resp. rate 17, height 5\' 9"  (1.753 m), weight 85.5 kg (188 lb 6.4 oz), SpO2 97 %. No results found. Results for orders placed or performed during the hospital encounter of 08/25/16 (from the past 72 hour(s))  Glucose, capillary     Status: Abnormal   Collection Time: 09/28/16 12:06 PM  Result Value Ref Range   Glucose-Capillary 113 (H) 65 - 99 mg/dL   Comment 1 Notify RN   Glucose, capillary     Status: None   Collection Time: 09/28/16  5:22 PM  Result Value Ref Range   Glucose-Capillary 77 65 - 99 mg/dL   Comment 1 Notify RN   Glucose, capillary     Status: Abnormal   Collection Time: 09/28/16 10:05 PM  Result Value Ref Range   Glucose-Capillary 113 (H) 65 - 99 mg/dL  CBC     Status: Abnormal   Collection Time: 09/29/16  4:47 AM  Result Value Ref Range   WBC 12.1 (H) 4.0 - 10.5 K/uL   RBC 4.84 4.22 - 5.81 MIL/uL   Hemoglobin 15.7 13.0 - 17.0 g/dL   HCT 47.7 39.0 - 52.0 %   MCV 98.6 78.0 - 100.0 fL   MCH 32.4 26.0 - 34.0 pg   MCHC 32.9 30.0 - 36.0 g/dL   RDW 12.6 11.5 - 15.5 %   Platelets 202 150 - 400 K/uL  Glucose, capillary     Status: Abnormal   Collection Time: 09/29/16  7:16 AM  Result Value Ref Range   Glucose-Capillary 125 (H) 65 - 99 mg/dL   Comment 1 Notify RN   Glucose, capillary     Status: Abnormal   Collection Time: 09/29/16 11:54 AM  Result Value Ref Range   Glucose-Capillary 132 (H) 65 - 99 mg/dL  Glucose, capillary     Status: Abnormal   Collection Time: 09/29/16  4:57 PM  Result Value Ref Range   Glucose-Capillary 107 (H) 65 - 99 mg/dL  Glucose, capillary     Status: Abnormal   Collection Time: 09/29/16 10:14 PM  Result Value Ref Range   Glucose-Capillary 131 (H) 65 - 99 mg/dL  Glucose, capillary     Status: Abnormal   Collection Time: 09/30/16  6:31 AM   Result Value Ref Range   Glucose-Capillary 117 (H) 65 - 99 mg/dL  Glucose, capillary     Status: Abnormal   Collection Time: 09/30/16 12:03 PM  Result Value Ref Range   Glucose-Capillary 155 (H) 65 - 99 mg/dL   Comment 1 Notify RN   Glucose, capillary     Status: None   Collection Time: 09/30/16  4:59 PM  Result Value Ref Range   Glucose-Capillary 94 65 - 99 mg/dL   Comment 1 Notify RN   Glucose, capillary     Status: None   Collection Time: 09/30/16 10:07 PM  Result Value Ref Range   Glucose-Capillary 89 65 - 99 mg/dL  Glucose, capillary     Status: Abnormal   Collection Time: 10/01/16  6:39 AM  Result Value Ref Range   Glucose-Capillary 107 (H) 65 - 99 mg/dL     HEENT: Normocephalic, atraumatic.  Cardio: RRR. No JVD.  Resp: CTA bilaterally. Unlabored GI: BS +. Soft. Skin:   Intact. Warm and dry Neuro:   Motor is 5/5 in the right deltoid, biceps,  triceps, grip, hip flexor, knee extensor, ankle dorsiflexor 2/5 left biceps and finger flexors, left hip, knee extensor synergy, 1/5 at the ankle Dysarthric, increasing LUE extensor tone Musc/Skel:  No edema. No tenderness.  Gen. no acute distress. Well-developed.   Assessment/Plan: 1. Functional deficits secondary to Right MCA infarct with Left Hemiparesis which require 3+ hours per day of interdisciplinary therapy in a comprehensive inpatient rehab setting. Physiatrist is providing close team supervision and 24 hour management of active medical problems listed below. Physiatrist and rehab team continue to assess barriers to discharge/monitor patient progress toward functional and medical goals. FIM: Function - Bathing Position: Shower Body parts bathed by patient: Left arm, Chest, Abdomen, Front perineal area, Left upper leg, Right arm, Right lower leg, Left lower leg, Right upper leg, Back Body parts bathed by helper: Buttocks Bathing not applicable: Buttocks, Front perineal area Assist Level: Touching or steadying  assistance(Pt > 75%)  Function- Upper Body Dressing/Undressing What is the patient wearing?: Hospital gown Pull over shirt/dress - Perfomed by patient: Thread/unthread right sleeve, Put head through opening, Pull shirt over trunk, Thread/unthread left sleeve Pull over shirt/dress - Perfomed by helper: Thread/unthread left sleeve Button up shirt - Perfomed by patient: Thread/unthread right sleeve, Thread/unthread left sleeve Button up shirt - Perfomed by helper: Pull shirt around back, Button/unbutton shirt Assist Level: Supervision or verbal cues Function - Lower Body Dressing/Undressing Lower body dressing/undressing activity did not occur:  (did not occur with this clinician today) What is the patient wearing?: Pants, Shoes, AFO, Non-skid slipper socks Position: Wheelchair/chair at sink Underwear - Performed by helper: Thread/unthread left underwear leg Pants- Performed by patient: Thread/unthread right pants leg, Thread/unthread left pants leg Pants- Performed by helper: Pull pants up/down Non-skid slipper socks- Performed by patient: Don/doff right sock, Don/doff left sock Non-skid slipper socks- Performed by helper:  (A to stabilize leg in figure 4) Socks - Performed by patient: Don/doff left sock Shoes - Performed by patient: Don/doff right shoe, Don/doff left shoe Shoes - Performed by helper: Don/doff left shoe AFO - Performed by patient: Don/doff left AFO AFO - Performed by helper: Don/doff left AFO Assist for footwear: Partial/moderate assist Assist for lower body dressing: Touching or steadying assistance (Pt > 75%)  Function - Toileting Toileting activity did not occur: No continent bowel/bladder event Toileting steps completed by patient: Adjust clothing prior to toileting, Performs perineal hygiene, Adjust clothing after toileting Toileting steps completed by helper: Adjust clothing prior to toileting, Performs perineal hygiene, Adjust clothing after toileting Toileting  Assistive Devices: Grab bar or rail Assist level: Two helpers  Function - Air cabin crew transfer activity did not occur:  (using urinal or incont., no BM) Toilet transfer assistive device: Mechanical lift Mechanical lift: Stedy Assist level to toilet: 2 helpers Assist level from toilet: 2 helpers  Function - Chair/bed transfer Chair/bed transfer method: Squat pivot Chair/bed transfer assist level: Supervision or verbal cues Chair/bed transfer assistive device: Armrests Mechanical lift: Stedy Chair/bed transfer details: Manual facilitation for weight shifting, Verbal cues for safe use of DME/AE, Verbal cues for technique  Function - Locomotion: Wheelchair Will patient use wheelchair at discharge?: Yes Type: Manual Max wheelchair distance: 150 Assist Level: Supervision or verbal cues Assist Level: Supervision or verbal cues Assist Level: Supervision or verbal cues Turns around,maneuvers to table,bed, and toilet,negotiates 3% grade,maneuvers on rugs and over doorsills: No Function - Locomotion: Ambulation Ambulation activity did not occur: Safety/medical concerns (pt pushes toward hemi side; unsafe) Assistive device: Walker-hemi Max distance: 47ft  Assist level:  Maximal assist (Pt 25 - 49%) Walk 10 feet activity did not occur: Safety/medical concerns Assist level: Maximal assist (Pt 25 - 49%) Walk 50 feet with 2 turns activity did not occur: Safety/medical concerns Walk 150 feet activity did not occur: Safety/medical concerns Walk 10 feet on uneven surfaces activity did not occur: Safety/medical concerns  Function - Comprehension Comprehension: Auditory Comprehension assist level: Understands basic 90% of the time/cues < 10% of the time  Function - Expression Expression: Verbal Expression assist level: Expresses basic needs/ideas: With extra time/assistive device  Function - Social Interaction Social Interaction assist level: Interacts appropriately 90% of the time  - Needs monitoring or encouragement for participation or interaction.  Function - Problem Solving Problem solving assist level: Solves basic 75 - 89% of the time/requires cueing 10 - 24% of the time  Function - Memory Memory assist level: Recognizes or recalls 90% of the time/requires cueing < 10% of the time Patient normally able to recall (first 3 days only): Current season, Location of own room, Staff names and faces, That he or she is in a hospital  Medical Problem List and Plan: 1.  Left hemiplegia, limitations with self-care, difficulty with transfers secondary to right MCA CVA.  Cont CIR PT, OT, SLP- awaiting skilled nursing placement   2.  DVT Prophylaxis/Anticoagulation: Pharmaceutical: Xarelto for PE, expect at least 3 mo tx 3. Pain Management: Left hip/groin pain Improved no sig degenerative changes on Xray,4. Mood: Team to provide ego support.  LCSW to follow for evaluation and support.  5. Neuropsych: This patient is capable of making decisions on his own behalf. 6. Skin/Wound Care: routine pressure relief measures.  7. Fluids/Electrolytes/Nutrition: Monitor I/O. Encourage fluid intake.  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 9. T2DM: Hgb A1C- 10.5 --. Continue metformin 1000mg  bid. Was started on lantus  45 units daily at bedtime, with SSI for elevated BS, consulted RD for diabetic education and start education on insulin administration.  Will monitor BS ac/hs   CBG (last 3) controlled 4/13  Recent Labs  09/30/16 1659 09/30/16 2207 10/01/16 0639  GLUCAP 94 89 107*    10.  Acute/subacute right lower lobe pulmonary embolism.asymptomatic  Xarelto,  neurologically stable 11. HTN: Monitor BP bid--on lisinopril 20mg  daily  Controlled 4/13 Vitals:   09/30/16 1500 10/01/16 0549  BP: 114/68 133/79  Pulse: 76 75  Resp: 19 17  Temp: 97.9 F (36.6 C) 98.2 F (36.8 C)    12. Leucocytosis: Monitor for signs of  infection. UA negative. Afebrile,inproving WBC 11.6 on 4/4 13. Polycythemia: Smokes 1.5 PPD. Expect this to remain elevated during hospitalization, 14. COPD: SOB reported--CXR negative. Encourage tobacco cessation.  Managed without medications at this time.    15. Tobacco abuse: Counsel 16. Hypoalbuminemia, start pro-stat 17 Aortic aneursym infrarenal 6.8cm---will keep sys BPs on lower side 100-120s   Conservative mgt for now---. Dr. Donzetta Matters will follow up with patient in office once he's further recovered from CVA  LOS (Days) 19 A FACE TO [place] E 10/01/2016, 6:51 AM

## 2016-10-01 NOTE — Progress Notes (Signed)
Speech Language Pathology Discharge Summary  Patient Details  Name: Chad Combs MRN: [id number] Date of Birth: 11-10-1955  Today's Date: 10/01/2016 SLP Individual Time: 0930-1030 SLP Individual Time Calculation (min): 60 min   Skilled Therapeutic Interventions:  Skilled treatment sesson focused on cognition goals. SLP facilitated session by providing Mod A for use of directions to navigate hospital to go outside. Pt required supervision to order coffee, pay and navigate to exit building. Pt was returned to room, left upright in wheelchair, safety belt donned, chair alarm on and all needs within reach.     Patient has met 6 of 6 long term goals.  Patient to discharge at overall Mod level.   Clinical Impression/Discharge Summary:  Pt has made good progress during ST sessions and as a result he has met 6 of 6 LTGs. Pt has made progress across all areas but especially in the areas of dysphagia and speech intelligibility. Pt continues to require Mod A for completion of cognitive tasks. As a result, he requires 24 hour supervision and follow up ST services.   Care Partner:  Caregiver Able to Provide Assistance: No  Type of Caregiver Assistance: Physical;Cognitive  Recommendation:  Skilled Nursing facility  Rationale for SLP Follow Up: Maximize functional communication;Maximize cognitive function and independence;Maximize swallowing safety;Reduce caregiver burden   Equipment: N/A   Reasons for discharge: Treatment goals met   Patient/Family Agrees with Progress Made and Goals Achieved: Yes   Function:   Cognition Comprehension Comprehension assist level: Follows basic conversation/direction with no assist  Expression   Expression assist level: Expresses basic needs/ideas: With no assist  Social Interaction Social Interaction assist level: Interacts appropriately 90% of the time - Needs monitoring or encouragement for participation or interaction.  Problem Solving Problem solving  assist level: Solves basic 75 - 89% of the time/requires cueing 10 - 24% of the time  Memory Memory assist level: Recognizes or recalls 90% of the time/requires cueing < 10% of the time    Ashyia Schraeder B. [name], M.S., CCC-SLP Speech-Language Pathologist  Terris Bodin 10/01/2016, 10:56 AM

## 2016-10-01 NOTE — Progress Notes (Signed)
Social Work Patient ID: Chad Combs, male   DOB: 11-13-1955, 61 y.o.   MRN: [id number] Received confirmation for LOG, have contacted his son-Will and pt along with team and MD. Facility aware and will transfer after lunch. Pam-PA completing the discharge summary. Will arrange ambulance after lunch.

## 2016-10-01 NOTE — Progress Notes (Signed)
Physical Therapy Discharge Summary  Patient Details  Name: Chad Combs MRN: [id number] Date of Birth: 03-Jan-1956  Today's Date: 10/01/2016 PT Individual Time: 1300-1410   PT Individual Time Calculation: 70 min     Patient has met 8 of 10 long term goals due to improved activity tolerance, improved balance, improved postural control, increased strength, increased range of motion, decreased pain, ability to compensate for deficits, improved attention, improved awareness and improved coordination.  Patient to discharge at a wheelchair level [facility].   Patient's care partner unavailable to provide the necessary physical and cognitive assistance at discharge.  Reasons goals not met: decreased Neuromotor control in the LLE and LUE prevents pt from completing standing balance with less than min assist.   Recommendation:  Patient will benefit from ongoing skilled PT services in skilled nursing facility setting to continue to advance safe functional mobility, address ongoing impairments in balance, safety, strength, neuromotor control, coordination, and minimize fall risk.  Equipment: No equipment provided  Reasons for discharge: treatment goals met and discharge from hospital  Patient/family agrees with progress made and goals achieved: Yes   PT Treatment.  PT instructed pt in grad day assessment to assess progress toward goals. See below for detailed results. Pt overall requires Min assist for all transfers including bed>chair and car and sit<>stand. Mod-max assist for gait up to 50 ft with increased assist with increased distance. Following treatment, pt returned to bed with min assist to control the LLE and left supine in bed with call bell in reach and bed alarm activated.   PT Discharge Precautions/Restrictions Precautions Precautions: Fall Precaution Comments: L sided neglect, L shoulder subluxation Restrictions Weight Bearing Restrictions: No Vital Signs Therapy Vitals Temp:  97.9 F (36.6 C) Temp Source: Oral Pulse Rate: 92 Resp: 18 BP: (!) 143/75 Patient Position (if appropriate): Sitting Oxygen Therapy SpO2: 96 % O2 Device: Not Delivered Pain Pain Assessment Pain Assessment: No/denies pain Pain Score: 0-No pain Vision/Perception  Vision - Assessment Eye Alignment: Impaired (comment) Ocular Range of Motion: Restricted on the left Alignment/Gaze Preference: Head turned Tracking/Visual Pursuits: Left eye does not track laterally;Right eye does not track medially  Cognition Overall Cognitive Status: Impaired/Different from baseline Arousal/Alertness: Awake/alert Orientation Level: Oriented X4 Attention: Sustained Sustained Attention: Impaired Sustained Attention Impairment: Verbal complex;Functional complex Memory: Impaired Memory Impairment: Decreased recall of new information Awareness: Impaired Awareness Impairment: Emergent impairment Problem Solving: Impaired Problem Solving Impairment: Verbal complex;Functional complex Safety/Judgment: Appears intact Sensation Sensation Light Touch: Impaired by gross assessment Light Touch Impaired Details: Impaired LLE Additional Comments: extinction on the L and decreased light touch appreciation in the distal LLE.  Coordination Gross Motor Movements are Fluid and Coordinated: No Fine Motor Movements are Fluid and Coordinated: No Coordination and Movement Description: poor coordination and activation on the LLE.  Motor  Motor Motor: Hemiplegia;Abnormal postural alignment and control Motor - Discharge Observations:  decreased weightbearing through L LE  Mobility Bed Mobility Bed Mobility: Rolling Right;Rolling Left;Supine to Sit;Sit to Supine Rolling Right: 4: Min assist Rolling Right Details: Verbal cues for technique;Verbal cues for precautions/safety;Tactile cues for placement Rolling Left: 5: Supervision Rolling Left Details: Verbal cues for technique;Verbal cues for precautions/safety;Visual  cues/gestures for sequencing;Visual cues/gestures for precautions/safety;Verbal cues for sequencing Left Sidelying to Sit: 4: Min assist Left Sidelying to Sit Details: Verbal cues for technique;Verbal cues for precautions/safety;Verbal cues for gait pattern Supine to Sit: 4: Min assist Supine to Sit Details: Verbal cues for technique;Verbal cues for precautions/safety;Verbal cues for gait pattern;Manual facilitation for  placement;Verbal cues for sequencing Sit to Supine: 4: Min assist Sit to Supine - Details: Verbal cues for technique;Verbal cues for precautions/safety;Verbal cues for gait pattern;Manual facilitation for placement Transfers Transfers: Yes Sit to Stand: 4: Min assist Sit to Stand Details: Verbal cues for technique;Verbal cues for precautions/safety;Tactile cues for weight beaing;Tactile cues for weight shifting Stand to Sit: 4: Min assist Stand to Sit Details (indicate cue type and reason): Verbal cues for technique;Verbal cues for precautions/safety;Tactile cues for weight beaing;Tactile cues for weight shifting Squat Pivot Transfers: 4: Min Risk manager Details: Verbal cues for technique;Verbal cues for precautions/safety;Verbal cues for sequencing;Visual cues/gestures for sequencing;Tactile cues for placement Locomotion  Ambulation Ambulation: Yes Ambulation/Gait Assistance: 3: Mod assist;2: Max assist Ambulation Distance (Feet): 50 Feet Assistive device: Hemi-walker Ambulation/Gait Assistance Details: Manual facilitation for placement;Verbal cues for gait pattern;Verbal cues for sequencing;Verbal cues for technique;Verbal cues for precautions/safety;Manual facilitation for weight shifting Ambulation/Gait Assistance Details: mod assist progressing to max assist for LLE limb advancement Gait Gait: Yes Gait Pattern: Step-to pattern;Left steppage;Poor foot clearance - left Stairs / Additional Locomotion Stairs: No Wheelchair Mobility Wheelchair Mobility:  Yes Wheelchair Assistance: 5: Investment banker, operational Details: Verbal cues for Information systems manager: Right upper extremity;Right lower extremity Wheelchair Parts Management: Supervision/cueing Distance: 191f   Trunk/Postural Assessment  Cervical Assessment Cervical Assessment: Exceptions to [facility](R cervical rotation) Thoracic Assessment Thoracic Assessment: Exceptions to [facility]um[facility] Assessment Lumbar Assessment: Exceptions to [facility](L rotational pelvic obliquity) Postural Control Postural Control: Deficits on evaluation (delayed on the L, but significantly improved)  Balance Balance Balance Assessed: Yes Static Sitting Balance Static Sitting - Balance Support: Feet supported Static Sitting - Level of Assistance: 6: Modified independent (Device/Increase time) Dynamic Sitting Balance Dynamic Sitting - Balance Support: During functional activity;Feet supported Dynamic Sitting - Level of Assistance: 5: Stand by assistance;4: Min assist Sitting balance - Comments: Sitting to complete dressing task Static Standing Balance Static Standing - Balance Support: During functional activity Static Standing - Level of Assistance: 4: Min assist Dynamic Standing Balance Dynamic Standing - Balance Support: During functional activity Dynamic Standing - Level of Assistance: 4: Min assist Extremity Assessment      RLE Assessment RLE Assessment: Within Functional Limits LLE Assessment LLE Assessment: Exceptions to [facility] FallLLE Strength LLE Overall Strength Comments: grossly in sitting: hip flex/ext/abd/add 3-/5; knee flex/ext 2+/5 and ankle DF/PF 0/5   See Function Navigator for Current Functional Status.  ALorie Phenix4/13/2018, 2:16 PM

## 2016-10-01 NOTE — Progress Notes (Signed)
Social Work  Discharge Note  The overall goal for the admission was met for:   [facility]  Length of Stay: No-37 DAYS  Discharge activity level: No-MIN-MOD LEVEL OF ASSIST  Home/community participation: Yes  Services provided included: MD, RD, PT, OT, SLP, RN, CM, TR, Pharmacy, Neuropsych and SW  Financial Services: Other: PENDING MEDICAID  Follow-up services arranged: Other: NHP  Comments (or additional information):PT NOT AT A LEVEL WHERE HE CAN BE MANAGED AT HOME, NEEDS TO BE AT A HIGHER LEVEL. MEDICAID AND SSD STILL PENDING-PT DOES HAVE A LAWYER WORKING ON HIS DISABILITY APPROVAL.  Patient/Family verbalized understanding of follow-up arrangements: Yes  Individual responsible for coordination of the follow-up plan: WILL-SON AND PT  Confirmed correct DME delivered: Elease Hashimoto 10/01/2016    Elease Hashimoto

## 2016-10-01 NOTE — Progress Notes (Signed)
[facility]ed [facility] [phone] spk to [place] and gave verbal report on pt, meds due at six pm. Informed that transports has not picked up yet.

## 2016-10-01 NOTE — Progress Notes (Signed)
Occupational Therapy Discharge Summary  Patient Details  Name: Chad Combs MRN: [id number] Date of Birth: [birth year]   Patient has met 12 of 12 long term goals due to improved activity tolerance, improved balance, postural control, ability to compensate for deficits, improved attention, improved awareness and improved coordination.  Patient to discharge at overall min- Mod Assist level.  Patient's family/ caregivers unable to provided the needed assist required at d/c and therefore pt to d/c to SNF. Pt overall min-mod A depending on fatigue and attention. Pt with decreased weightbearing/ attention to L, resulting in the need for steadying assist and consistent cuing for midline orientation and attention. Pt with trace movements in L UE, requiring max-total A to use at functional level. Pt easily distracted with L inattention, requires cuing for attention to task and to L side during unfamiliar tasks.   Recommendation:  Patient will benefit from ongoing skilled OT services in skilled nursing facility setting to continue to advance functional skills in the area of BADL and Reduce care partner burden.  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) Patient Visual Report: No change from baseline Vision- Assessment Vision Assessment?: Yes Eye Alignment: Impaired (comment) (R eye deviation) Ocular Range of Motion: Within Functional Limits (L eye)  Cognition Overall Cognitive Status: Impaired/Different from baseline Arousal/Alertness: Awake/alert Orientation Level: Oriented  X4 Attention: Sustained Sustained Attention: Impaired Sustained Attention Impairment: Verbal complex;Functional complex Memory: Impaired Memory Impairment: Decreased recall of new information Awareness: Impaired Awareness Impairment: Emergent impairment Problem Solving: Impaired Problem Solving Impairment: Verbal complex;Functional complex Safety/Judgment: Appears intact Sensation Sensation Light Touch: Impaired Detail Light Touch Impaired Details: Impaired LUE Coordination Gross Motor Movements are Fluid and Coordinated: No Fine Motor Movements are Fluid and Coordinated: No Coordination and Movement Description: Pt currently Brunnstrum stage II in the left arm and stage I in the hand.  He needed max hand over hand assistance for integration as an active assist with washing the RUE during ADL session.  Motor  Motor Motor: Hemiplegia;Abnormal postural alignment and control Motor - Discharge Observations: R lean, decreased weightbearing through L LE  Trunk/Postural Assessment  Cervical Assessment Cervical Assessment: Exceptions to [facility] (R cervical rotation) Thoracic Assessment Thoracic Assessment: Exceptions to [facility] Lumbar Assessment Lumbar Assessment: Exceptions to [facility] (L rotational pelvic obliquity) Postural Control Postural Control: Deficits on evaluation (delayed on the L, but significantly improved)  Balance Balance Balance Assessed: Yes Static Sitting Balance Static Sitting - Balance Support: Feet supported Static Sitting - Level of Assistance: 6: Modified independent (Device/Increase time) Dynamic Sitting Balance Dynamic Sitting - Balance Support: During functional activity;Feet supported Dynamic Sitting - Level of Assistance: 5: Stand by assistance;4: Min assist Sitting balance - Comments: Sitting to complete dressing task Static Standing Balance Static Standing - Balance Support: During functional activity Static Standing - Level of Assistance: 4: Min assist Dynamic  Standing Balance Dynamic Standing - Balance Support: During functional activity Dynamic Standing - Level of Assistance: 4: Min assist Dynamic Standing - Comments: Standing to complete LB dressing/ toileting tasks. Decreased weightbearing through L LE Extremity/Trunk Assessment RUE Assessment RUE Assessment: Within Functional Limits LUE Assessment LUE Assessment: Exceptions to [facility] LUE Strength LUE Overall Strength Comments: Stage  II emerging III Brunnstrum. Shoulder subluxation   See Function Navigator for Current Functional Status.  Bobby Rumpf, Rocky Rishel C 10/01/2016, 12:58 PM

## 2016-10-01 NOTE — Progress Notes (Signed)
Occupational Therapy Session Note  Patient Details  Name: Chad Combs MRN: [id number] Date of Birth: 02/04/1956  Today's Date: 10/01/2016 OT Individual Time: 5284-1324 OT Individual Time Calculation (min): 60 min    Short Term Goals: Week 5:  OT Short Term Goal 1 (Week 5): STG=LTG due to awaiting SNF placement  Skilled Therapeutic Interventions/Progress Updates:    Pt seen for OT ADL bathing/dressing sessuon. Pt in supine upon arrival, reuqiing increased time and cues for arousal, but agreeable to tx session. He transferred from flat bed without use of bedrails with min A and multimodal cuing.  He completed squat pivot transfers throughout session with close supervision. He stood from toilet with steadying assist to complete clothing management during toileting tasks, laterally leaning to complete hygiene. Pt not bearing weight through L LE during functional standing, therapist assisted with weight shift to L.  He completed bathing task w/c at sink, he declined showering today. Hand over hand assist provided to wash R UE.  Pt left seated in w/c at sink completing grooming tasks with RN present and awaiting SLP.   Therapy Documentation Precautions:  Precautions Precautions: Fall Precaution Comments: L sided neglect, shoulder increased risk for subluxation; quick LOB L wihtout awareness Restrictions Weight Bearing Restrictions: No Pain:    No/ denies pain  See Function Navigator for Current Functional Status.   Therapy/Group: Individual Therapy  Lewis, Jesper Stirewalt C 10/01/2016, 7:05 AM

## 2016-10-02 NOTE — Progress Notes (Signed)
Pt d/c via carelink service with personal belongings.  Report called prior to d/c to [employer].

## 2016-10-04 ENCOUNTER — Encounter: Payer: Self-pay | Admitting: Adult Health

## 2016-10-04 ENCOUNTER — Non-Acute Institutional Stay (SKILLED_NURSING_FACILITY): Payer: Self-pay | Admitting: Adult Health

## 2016-10-04 DIAGNOSIS — I714 Abdominal aortic aneurysm, without rupture, unspecified: Secondary | ICD-10-CM

## 2016-10-04 DIAGNOSIS — E1169 Type 2 diabetes mellitus with other specified complication: Secondary | ICD-10-CM | POA: Insufficient documentation

## 2016-10-04 DIAGNOSIS — E1149 Type 2 diabetes mellitus with other diabetic neurological complication: Secondary | ICD-10-CM | POA: Insufficient documentation

## 2016-10-04 DIAGNOSIS — I82412 Acute embolism and thrombosis of left femoral vein: Secondary | ICD-10-CM

## 2016-10-04 DIAGNOSIS — E785 Hyperlipidemia, unspecified: Secondary | ICD-10-CM

## 2016-10-04 DIAGNOSIS — IMO0002 Reserved for concepts with insufficient information to code with codable children: Secondary | ICD-10-CM | POA: Insufficient documentation

## 2016-10-04 DIAGNOSIS — G8194 Hemiplegia, unspecified affecting left nondominant side: Secondary | ICD-10-CM

## 2016-10-04 DIAGNOSIS — Z720 Tobacco use: Secondary | ICD-10-CM

## 2016-10-04 DIAGNOSIS — Z794 Long term (current) use of insulin: Secondary | ICD-10-CM

## 2016-10-04 DIAGNOSIS — J449 Chronic obstructive pulmonary disease, unspecified: Secondary | ICD-10-CM

## 2016-10-04 DIAGNOSIS — E1165 Type 2 diabetes mellitus with hyperglycemia: Secondary | ICD-10-CM

## 2016-10-04 DIAGNOSIS — E1140 Type 2 diabetes mellitus with diabetic neuropathy, unspecified: Secondary | ICD-10-CM

## 2016-10-04 DIAGNOSIS — I63511 Cerebral infarction due to unspecified occlusion or stenosis of right middle cerebral artery: Secondary | ICD-10-CM

## 2016-10-04 DIAGNOSIS — I2692 Saddle embolus of pulmonary artery without acute cor pulmonale: Secondary | ICD-10-CM

## 2016-10-04 NOTE — Progress Notes (Addendum)
Location:   [place] Room Number: 159 A Place of Service:  SNF (31)   CODE STATUS: Full Code  No Known Allergies  Chief Complaint  Patient presents with  . Hospitalization Follow-up    Hospital follow up    HPI:  He has been hospitalized for a right acute MCA cva. He was admitted to to inpatient rehab unit for acute care rehab. He is here to complete his short term rehab; with his goal to return back home. I am doubtful that he will be able to go home on an independent basis. He is not voicing any complaints at this time. There are no nursing concerns at this time.    Past Medical History:  Diagnosis Date  . Blind right eye   . COPD (chronic obstructive pulmonary disease) ([place])   . Diabetes mellitus without complication ([place])   . Hypertension     Past Surgical History:  Procedure Laterality Date  . IVC FILTER INSERTION N/A 08/24/2016   Procedure: IVC Filter Insertion;  Surgeon: Katha Cabal, MD;  Location: [facility];  Service: Cardiovascular;  Laterality: N/A;    Social History   Social History  . Marital status: Single    Spouse name: N/A  . Number of children: N/A  . Years of education: N/A   Occupational History  . Retired Administrator    Social History Main Topics  . Smoking status: Current Every Day Smoker    Packs/day: 1.50    Years: 45.00    Types: Cigarettes  . Smokeless tobacco: Never Used  . Alcohol use 0.0 oz/week     Comment: occasional (once every couple months)  . Drug use: No  . Sexual activity: No   Other Topics Concern  . Not on file   Social History Narrative  . No narrative on file   Family History  Problem Relation Age of Onset  . Cancer Mother   . Heart disease Father       VITAL SIGNS BP 135/75   Pulse 87   Temp 98.6 F (37 C)   Resp 18   Ht 5\' 9"  (1.753 m)   Wt 188 lb 6 oz (85.4 kg)   SpO2 98%   BMI 27.82 kg/m    Patient's Medications  New Prescriptions   No medications on file    Previous Medications   ATORVASTATIN (LIPITOR) 40 MG TABLET    Take 40 mg by mouth daily at 6 PM.   EZETIMIBE (ZETIA) 10 MG TABLET    Take 1 tablet (10 mg total) by mouth daily at 6 PM.   FAMOTIDINE (PEPCID) 40 MG TABLET    Take 1 tablet (40 mg total) by mouth 2 (two) times daily.   FLUTICASONE (FLONASE) 50 MCG/ACT NASAL SPRAY    Place 1 spray into both nostrils daily.   FLUTICASONE-SALMETEROL (ADVAIR) 100-50 MCG/DOSE AEPB    Inhale 1 puff into the lungs 2 (two) times daily.   INSULIN ASPART (NOVOLOG) 100 UNIT/ML INJECTION    Inject as per sliding scale subcutaneously after meals: 0-149 = 0 150+ = 5 units   INSULIN GLARGINE (LANTUS) 100 UNIT/ML INJECTION    Inject 0.45 mLs (45 Units total) into the skin daily.   LISINOPRIL (PRINIVIL,ZESTRIL) 20 MG TABLET    Take 20 mg by mouth daily.   METFORMIN (GLUCOPHAGE) 1000 MG TABLET    Take 1,000 mg by mouth 2 (two) times daily with a meal.   NICOTINE (NICODERM CQ -  DOSED IN MG/24 HOURS) 14 MG/24HR PATCH    Place 1 patch (14 mg total) onto the skin daily.   RIVAROXABAN (XARELTO) 15 MG TABS TABLET    Take 1 tablet (15 mg total) by mouth 2 (two) times daily with a meal. Till 4/19   RIVAROXABAN (XARELTO) 20 MG TABS TABLET    Take 1 tablet (20 mg total) by mouth daily with supper. Start on 10/08/16   SENNA-DOCUSATE (SENOKOT-S) 8.6-50 MG TABLET    Take 1 tablet by mouth at bedtime.   TAMSULOSIN (FLOMAX) 0.4 MG CAPS CAPSULE    Take 1 capsule (0.4 mg total) by mouth daily after supper.   TIZANIDINE (ZANAFLEX) 2 MG TABLET    Take 1 tablet (2 mg total) by mouth at bedtime.  Modified Medications   No medications on file  Discontinued Medications   ATORVASTATIN (LIPITOR) 40 MG TABLET    Take 1 tablet (40 mg total) by mouth daily at 6 PM.   METFORMIN (GLUCOPHAGE) 500 MG TABLET    Take 1,000 mg by mouth 2 (two) times daily.      SIGNIFICANT DIAGNOSTIC EXAMS  08-21-16: ct of head: 1. Evolving acute infarct at the right parietal and temporal lobes, and right basal  ganglia. Mild mass-effect on the frontal horn of the right lateral ventricle. No midline shift seen. No evidence of hemorrhagic transformation at this time. 2. Minimally displaced fracture through the distal tip of the nasal bone, of indeterminate age.  08-22-16: 2-d echo: - Left ventricle: The cavity size was normal. Systolic function was normal. The estimated ejection fraction was 60%. Hypokinesis of the anteroseptal myocardium. Doppler parameters are consistent with abnormal left ventricular relaxation (grade 1 diastolic dysfunction). - Aortic valve: Valve area (Vmax): 1.39 cm^2. - Mitral valve: There was mild regurgitation. - Left atrium: The atrium was mildly dilated. - Right ventricle: The cavity size was mildly dilated.  08-22-16: carotid doppler: 1. 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 2. Significant plaque at the level of the left carotid bulb and proximal ICA causing significant proximal left ICA stenosis. Estimated stenosis is greater than 70%.  08-22-16: mri/mra head: 1. Acute to early subacute large right MCA infarct. 2. 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 3. Small chronic right frontal infarct.   08-24-16: left lower extremity doppler: Nonocclusive thrombosis within the LEFT common femoral vein and femoral vein. Decreased clot burden to ultrasound 05/04/2016. No new thrombus present.   09-09-16: abdominal aorta screening ultrasound: 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 CTA is recommended for further evaluation. Vascular surgery consultation recommended due to increased risk of rupture for AAA >5.5 cm.  09-13-16: ct angio of chest abdomen and pelvis: 1. Solitary acute PE in the medial basal segment right lower lobe pulmonary artery branch. 2. 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 3. 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 4. Infrarenal retrievable IVC filter. 5. Curvilinear calcifications along the left posterolateral urinary bladder wall may represent small layering calculi versus partially calcified bladder wall lesion.   09-14-16: bilateral lower extremity doppler: - No evidence of deep vein thrombosis involving the right lower extremity. - Findings consistent with subacute deep vein thrombosis involving   the left common femoral vein and left femoral  vein. - No evidence of Baker&'s cyst on the right or left.  09-16-16: left upper extremity doppler: No evidence of deep vein or superficial thrombosis involving the left upper extremity.   LABS REVIEWED:   08-22-16: hgb a1c 10.5; chol 193; trig 441; hdl 35 09-15-16: wbc 11.6; hgb 15.5; hct 45.5 ;mcv 97.2; plt 198; glucose 86; bun 16; creat 0.86; k+ 3.5; na++ 137 09-22-16; wbc 11.6; hgb 15.9; hct 46.5 mcv 97.5; plt 200; glucose 86; bun 15; creat 0.91; k+ 3.7; na++ 140; liver normal albumin 3.6   Review of Systems  Constitutional: Negative for malaise/fatigue.  Respiratory: Negative for cough and shortness of breath.   Cardiovascular: Negative for chest pain, palpitations and leg swelling.  Gastrointestinal: Negative for abdominal pain, constipation and heartburn.  Musculoskeletal: Negative for back pain, joint pain and myalgias.  Skin: Negative.   Neurological: Negative for dizziness.  Psychiatric/Behavioral: The patient is not nervous/anxious.     Physical Exam  Constitutional: He appears well-developed and well-nourished. No distress.  Eyes: Conjunctivae are normal.  Blind right eye   Neck: Neck supple. No JVD present. No thyromegaly present.  Cardiovascular: Normal rate, regular rhythm and intact distal pulses.   Respiratory: Effort normal and breath sounds normal. No respiratory distress. He has no  wheezes.  GI: Soft. Bowel sounds are normal. He exhibits no distension. There is no tenderness.  Musculoskeletal: He exhibits no edema.  Left hemiparesis Able to move right extremities.   Lymphadenopathy:    He has no cervical adenopathy.  Neurological: He is alert.  Skin: Skin is warm and dry. He is not diaphoretic.  Psychiatric: He has a normal mood and affect.      ASSESSMENT/ PLAN:  1. Pulmonary embolism: has DVT of left femoral vein: is status post IVC filter placement (08-24-16) will continue xarelto 50 mg twice daily through 10-07-16 then 20 mg daily   2. 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 Hypertension: will continue lisinopril 40 mg daily   4. Diabetes: hgb a1c 10.5; will continue lantus 45 units daily and novolog 5 units with meals metformin 1 gm twice daily   5. Dyslipidemia: trig 441; will continue zetia 10 mg daily and lipitor 40 mg daily  6.  COPD: will continue advair 100/50 twice daily and flonase daily   7. gerd: will continue pepcid 40 mg twice daily   8. Dysphagia: on thin liquids; no signs of aspiration; will monitor   9. BPH: will continue flomax 0.4 mg daily   10. Smoker: is on nicotine patch 14 mg daily   11. Constipation: will continue senna s daily   12. Left hemiplegia: will continue zanaflex 2 mg nightly for spasticity  13. AAA without rupture: 6.8 cm: was seen by vascular while in hospital (08-24-16)  will monitor    He will need to follow up with vein and vascular in May    Time spent with patient  50  minutes >50% time spent counseling; reviewing medical record; tests; labs; and developing future plan of care   MD is aware of resident's narcotic use and is in agreement with current plan of care. We will attempt to wean resident as apropriate   Ok Edwards NP [facility]  Contact [phone] Monday through Friday 8am- 5pm  After hours  call [phone]

## 2016-10-06 ENCOUNTER — Non-Acute Institutional Stay (SKILLED_NURSING_FACILITY): Payer: Self-pay | Admitting: Internal Medicine

## 2016-10-06 ENCOUNTER — Encounter: Payer: Self-pay | Admitting: Internal Medicine

## 2016-10-06 DIAGNOSIS — I714 Abdominal aortic aneurysm, without rupture, unspecified: Secondary | ICD-10-CM

## 2016-10-06 DIAGNOSIS — E1169 Type 2 diabetes mellitus with other specified complication: Secondary | ICD-10-CM

## 2016-10-06 DIAGNOSIS — E785 Hyperlipidemia, unspecified: Secondary | ICD-10-CM

## 2016-10-06 DIAGNOSIS — Z794 Long term (current) use of insulin: Secondary | ICD-10-CM

## 2016-10-06 DIAGNOSIS — I63511 Cerebral infarction due to unspecified occlusion or stenosis of right middle cerebral artery: Secondary | ICD-10-CM

## 2016-10-06 DIAGNOSIS — E1140 Type 2 diabetes mellitus with diabetic neuropathy, unspecified: Secondary | ICD-10-CM

## 2016-10-06 DIAGNOSIS — I10 Essential (primary) hypertension: Secondary | ICD-10-CM

## 2016-10-06 DIAGNOSIS — IMO0002 Reserved for concepts with insufficient information to code with codable children: Secondary | ICD-10-CM

## 2016-10-06 DIAGNOSIS — E1165 Type 2 diabetes mellitus with hyperglycemia: Secondary | ICD-10-CM

## 2016-10-06 NOTE — Assessment & Plan Note (Signed)
Add metoprolol 12.5 mg twice a day as blood pressure is not at goal in context of AAA

## 2016-10-06 NOTE — Assessment & Plan Note (Signed)
Continue Xarelto PT/OT at [facility]

## 2016-10-06 NOTE — Patient Instructions (Signed)
See assessment and plan under each diagnosis in the problem list and acutely for this visit 

## 2016-10-06 NOTE — Progress Notes (Signed)
This is a comprehensive admission note to [employer] performed on this date less than 30 days from date of admission. Included are preadmission medical/surgical history;reconciled medication list; family history; social history and comprehensive review of systems.  Corrections and additions to the records were documented . Comprehensive physical exam was also performed. Additionally a clinical summary was entered for each active diagnosis pertinent to this admission in the Problem List to enhance continuity of care.  PCP: Patient unable to provide name  HPI: The patient was hospitalized 3/3-4/13/18 with an acute right MCA stroke with left hemiplegia, dysphagia  and dysarthria in the context of uncontrolled diabetes, hypertension, and tobacco abuse. The patient was admitted to the [facility] on 3/3 with  left-sided weakness and confusion. CT revealed an evolving acute infarct in the right parietal and temporal lobes and indeterminate displaced fracture through the tip of the nasal bone. MRI revealed right MCA infarct and MRA revealed occlusion of the right ICA and right MCA with acute to early subacute large right MCA infarct. 2-D echo revealed hypokinesis of the anterior myocardium suggestive of coronary disease. Doppler revealed occlusion of the right ICA detectable flow in the neck beyond its origin with significant plaque at the level of the left carotid bulb proximal ICA causing significant proximal ICA stenosis over 70%. Plavix was added to aspirin for stroke prevention Follow-up Dopplers revealed nonocclusive DVT of the left common femoral vein and femoral vein. ICV filter was placed by Interventional Radiology 08/24/16. Patient was admitted to rehabilitation 3/7 for intensive  PT, ST, OT 5 days a week. He had persistent leukocytosis without clinical evidence of infection. His pressures improved and lisinopril dose was decreased because of hypotension.  Lantus was titrated with improved blood sugar control. Course was complicated by urinary retention requiring in and out catheterization. Urecholine and Flomax were started but then Urecholine subsequently discontinued. He had been a 1.5 pack per day smoker. Nicotine patch was initiated at rehabilitation. An incidental finding on a film to evaluate left hip pain revealed a large infrarenal AAA. Doppler ultrasound revealed a 6.5 cm aneurysmal dilation of the mid aorta. 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 Follow-up Dopplers revealed subacute DVT in the left CFV & FV Prior to initiation of anticoagulation CT of the head was repeated. This revealed hemorrhagic conversion of the R MCA infarct.Dr Erlinda Hong recommend IV heparin for the PE given the small hemorrhagic transformation with transition to a novel oral coagulant stable over 48-72 hours. The transition to Xarelto was completed. At presentation he had moderate cognitive deficits within MoCA score of 14/22 Family is unable to provide significant physical care needed and SNF was elected He was to be on a dysphagia 3 diabetic/heart healthy diet. Use of straws for fluids was to be avoided. Pressure goals were to be 100-120 range due to the AAA.  Chemistries were last performed 09/22/16 and were completely normal. CBC was performed 4/11. Leukocytosis with a white count of 12,100 was present. Last A1c was 08/22/16 revealing severely uncontrolled diabetes with hemoglobin of 10.5%.  Past medical and surgical history:  The patient had a left lower lower extremity DVT 11/17 when he ran out of his novel oral anticoagulant. Hx also includes hyypertension, diabetes, COPD, and blindness in the right eye.  Social history: Former 1.5 pack per day smoker. Occasional alcoholic beverage.  Family history: Mother cancer, father heart disease.  Review of  systems: Patient describes constipation, frequency, and thirst. He has occasional shortness of breath and cough. Constitutional: No fever,significant weight change, fatigue  Eyes: No redness, discharge, pain, vision change ENT/mouth: No nasal congestion,  purulent discharge, earache,change in hearing ,sore throat  Cardiovascular: No chest pain, palpitations,paroxysmal nocturnal dyspnea, claudication, edema  Respiratory: No hemoptysis, significant snoring,apnea   Gastrointestinal: No heartburn,dysphagia,abdominal pain, nausea / vomiting,rectal bleeding, melena Genitourinary: No dysuria,hematuria, pyuria,  incontinence, nocturia Musculoskeletal: No joint stiffness, joint swelling, weakness,pain Dermatologic: No rash, pruritus, change in appearance of skin Neurologic: No dizziness,headache,syncope, seizures, numbness , tingling Psychiatric: No significant anxiety , depression, insomnia, anorexia Endocrine: No change in hair/skin/ nails, excessive hunger  Hematologic/lymphatic: No significant bruising, lymphadenopathy,abnormal bleeding Allergy/immunology: No itchy/ watery eyes, significant sneezing, urticaria, angioedema  Physical exam:  Pertinent or positive findings: Speech is somewhat slurred. Answers are short but appropriate. He has resting exotropia on the right. Pattern alopecia is present. Has multiple missing teeth. There is erythema of the oropharynx. Hemiplegia present on the left. Pedal pulses are decreased. Has an intermittent intention tremor of the right upper extremity. Affect is flat. Oriented X 3  General appearance:Adequately nourished; no acute distress , increased work of breathing is present.   Lymphatic: No lymphadenopathy about the head, neck, axilla . Eyes: No conjunctival inflammation or lid edema is present. There is no scleral icterus. Ears:  External ear exam shows no significant lesions or deformities.   Nose:  External nasal examination shows no deformity or  inflammation. Nasal mucosa are pink and moist without lesions ,exudates Oral exam: lips and gums are healthy appearing.There is no oropharyngeal erythema or exudate . Neck:  No thyromegaly, masses, tenderness noted.    Heart:  Normal rate and regular rhythm. S1 and S2 normal without gallop, murmur, click, rub .  Lungs:Chest clear to auscultation without wheezes, rhonchi,rales , rubs. Abdomen:Bowel sounds are normal. Abdomen is soft and nontender with no organomegaly, hernias,masses. GU: deferred  Extremities:  No cyanosis, clubbing,edema  Neurologic exam : Balance,Rhomberg,finger to nose testing could not be completed due to clinical state Deep tendon reflexes are equal Skin: Warm & dry w/o tenting. No significant lesions or rash.  See clinical summary under each active problem in the Problem List with associated updated therapeutic plan

## 2016-10-06 NOTE — Assessment & Plan Note (Signed)
LDL goal < 70 

## 2016-10-06 NOTE — Assessment & Plan Note (Signed)
An  A1c of 8% or less  is the safest goal; it is critical to prevent hypoglycemia. Recheck A1c in  June 2018

## 2016-10-06 NOTE — Assessment & Plan Note (Signed)
Maintain systolic blood pressure [phone] to prevent rupture

## 2016-10-08 ENCOUNTER — Ambulatory Visit: Payer: Self-pay | Admitting: Vascular Surgery

## 2016-10-15 ENCOUNTER — Ambulatory Visit: Payer: Self-pay | Admitting: Vascular Surgery

## 2016-10-25 ENCOUNTER — Encounter (INDEPENDENT_AMBULATORY_CARE_PROVIDER_SITE_OTHER): Payer: Self-pay | Admitting: Vascular Surgery

## 2016-10-29 ENCOUNTER — Ambulatory Visit (HOSPITAL_BASED_OUTPATIENT_CLINIC_OR_DEPARTMENT_OTHER): Payer: Self-pay | Admitting: Physical Medicine & Rehabilitation

## 2016-10-29 ENCOUNTER — Encounter: Payer: Self-pay | Admitting: Physical Medicine & Rehabilitation

## 2016-10-29 ENCOUNTER — Encounter: Payer: Medicaid Other | Attending: Physical Medicine & Rehabilitation

## 2016-10-29 VITALS — BP 130/73 | HR 71 | Resp 14

## 2016-10-29 DIAGNOSIS — J449 Chronic obstructive pulmonary disease, unspecified: Secondary | ICD-10-CM | POA: Diagnosis not present

## 2016-10-29 DIAGNOSIS — H5461 Unqualified visual loss, right eye, normal vision left eye: Secondary | ICD-10-CM | POA: Diagnosis not present

## 2016-10-29 DIAGNOSIS — E119 Type 2 diabetes mellitus without complications: Secondary | ICD-10-CM | POA: Diagnosis not present

## 2016-10-29 DIAGNOSIS — I69398 Other sequelae of cerebral infarction: Secondary | ICD-10-CM | POA: Insufficient documentation

## 2016-10-29 DIAGNOSIS — G8110 Spastic hemiplegia affecting unspecified side: Secondary | ICD-10-CM

## 2016-10-29 DIAGNOSIS — G8112 Spastic hemiplegia affecting left dominant side: Secondary | ICD-10-CM | POA: Insufficient documentation

## 2016-10-29 DIAGNOSIS — F17210 Nicotine dependence, cigarettes, uncomplicated: Secondary | ICD-10-CM | POA: Diagnosis not present

## 2016-10-29 DIAGNOSIS — I10 Essential (primary) hypertension: Secondary | ICD-10-CM | POA: Insufficient documentation

## 2016-10-29 NOTE — Patient Instructions (Signed)
Will inject Botox in the wrist , elbow and finger flexors on the left side to reduce excess tone

## 2016-10-29 NOTE — Progress Notes (Signed)
Subjective:    Patient ID: Chad Combs, male    DOB: 1956/05/29, 61 y.o.   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  CT head done with evolving acute infarct in right parietal and temporal lobes and indeterminate minimally displaced fracture thorough tip of nasal bone. MRI reviewed showing right MCA infarct. MRA brain done revealing occlusion of R-ICA and R-MCA with acute to early subacute large R-MCA infarct. 2 D echo with EF 60% and hypokinesis of anteroseptal myocardium suggestive of CAD. Carotid dopplers with occlusion of R-ICA with no detectable flow in the neck beyond its origin and significant plaque at level of left carotid bulb and proximal IC causing significant proximal ICA stenosis >70%.  Dr. Doy Mince evaluated patient and recommended adding Plavix to ASA for stroke prevention and to follow up with vascular for L-ICA stenosis. Follow up dopplers with non-occlusive DVT left CFV and femoral vein and IVC filter placed by radiology. Therapy initiated and patient limited by left sided weakness with right gaze preference, left field cut, slurred speech and dysphagia. CIR was recommended for follow up therapy.  HPI Patient was discharged from inpatient rehabilitation and is now in a skilled nursing facility. He is receiving PT, OT and speech therapy. Uses AFO to amb but reports a KAFO has been orderED Some increased tightness in Left elbow , wrist , and hand He takes tizanidine at night for spasms. It makes him drowsy during the day, however. Physical therapy has been stretching his left arm. Pain Inventory Average Pain 0 Pain Right Now 0 My pain is no pain  In the last 24 hours, has pain interfered with the following? General activity 0 Relation with others 0 Enjoyment of life 0 What  TIME of day is your pain at its worst? no pain Sleep (in general) Poor  Pain is worse with: no pain Pain improves with: no pain Relief from Meds: no pain  Mobility use a wheelchair needs help with transfers  Function not employed: date last employed . I need assistance with the following:  toileting  Neuro/Psych bowel control problems weakness numbness tremor trouble walking spasms dizziness confusion depression anxiety  Prior Studies CT/MRI hospital follow up  Physicians involved in your care hospital follow up   Family History  Problem Relation Age of Onset  . Cancer Mother   . Heart disease Father    Social History   Social History  . Marital status: Single    Spouse name: N/A  . Number of children: N/A  . Years of education: N/A   Occupational History  . Retired Administrator    Social History Main Topics  . Smoking status: Current Every Day Smoker    Packs/day: 1.50    Years: 45.00    Types: Cigarettes  . Smokeless tobacco: Never Used  . Alcohol use 0.0 oz/week     Comment: occasional (once every couple months)  . Drug use: No  . Sexual activity: No   Other Topics Concern  . None   Social History Narrative  . None   Past Surgical History:  Procedure Laterality Date  . IVC FILTER INSERTION N/A 08/24/2016   Procedure: IVC Filter Insertion;  Surgeon: Katha Cabal, MD;  Location: [facility];  Service: Cardiovascular;  Laterality: N/A;  Past Medical History:  Diagnosis Date  . Blind right eye   . COPD (chronic obstructive pulmonary disease) ([place])   . Diabetes mellitus without complication ([place])   . Hypertension    BP 130/73   Pulse 71   Resp 14   SpO2 96%   Opioid Risk Score:   Fall Risk Score:  `1  Depression screen PHQ 2/9  Depression screen PHQ 2/9 07/04/2015  Decreased Interest 0  Down, Depressed, Hopeless 0  PHQ - 2 Score 0    Review of Systems  Constitutional: Positive for appetite change, diaphoresis  and unexpected weight change.  HENT: Negative.   Eyes: Negative.   Respiratory: Negative.   Cardiovascular: Negative.   Gastrointestinal: Positive for constipation.  Endocrine: Negative.   Genitourinary: Negative.   Musculoskeletal: Positive for gait problem.       Spasms   Skin: Negative.   Allergic/Immunologic: Negative.   Neurological: Positive for tremors, weakness and numbness.       Tingling  Hematological: Negative.   Psychiatric/Behavioral: Positive for confusion, decreased concentration and dysphoric mood. The patient is nervous/anxious.   All other systems reviewed and are negative.      Objective:   Physical Exam  Constitutional: He is oriented to person, place, and time. He appears well-developed and well-nourished.  HENT:  Head: Normocephalic and atraumatic.  Eyes: Conjunctivae are normal. Pupils are equal, round, and reactive to light.  Neck: Normal range of motion.  Neurological: He is alert and oriented to person, place, and time.  Psychiatric: He has a normal mood and affect.  Nursing note and vitals reviewed.  MAS 3 Finger and wrist flexors on left MAS 3 in  Left BIceps  MMT  2- Left delt, biceps  Speech dysarthric  There is evidence of left hemispatial neglect    Assessment & Plan:  1.  RIght MCA infarct Left spastic hemi and aphasia Continue PT, OT, speech at skilled nursing facility  2. Spasticity, increasing left upper extremity, scheduled for botulinum toxin injection at the following doses.  Biceps 50 Brachiorad 50 FCR 50 FDS 25 FDP25

## 2016-11-01 ENCOUNTER — Non-Acute Institutional Stay (SKILLED_NURSING_FACILITY): Payer: Medicaid Other | Admitting: Adult Health

## 2016-11-01 ENCOUNTER — Encounter: Payer: Self-pay | Admitting: Adult Health

## 2016-11-01 DIAGNOSIS — I2692 Saddle embolus of pulmonary artery without acute cor pulmonale: Secondary | ICD-10-CM | POA: Diagnosis not present

## 2016-11-01 DIAGNOSIS — Z794 Long term (current) use of insulin: Secondary | ICD-10-CM

## 2016-11-01 DIAGNOSIS — I69391 Dysphagia following cerebral infarction: Secondary | ICD-10-CM

## 2016-11-01 DIAGNOSIS — I82412 Acute embolism and thrombosis of left femoral vein: Secondary | ICD-10-CM | POA: Diagnosis not present

## 2016-11-01 DIAGNOSIS — I714 Abdominal aortic aneurysm, without rupture, unspecified: Secondary | ICD-10-CM

## 2016-11-01 DIAGNOSIS — E1140 Type 2 diabetes mellitus with diabetic neuropathy, unspecified: Secondary | ICD-10-CM | POA: Diagnosis not present

## 2016-11-01 DIAGNOSIS — J449 Chronic obstructive pulmonary disease, unspecified: Secondary | ICD-10-CM

## 2016-11-01 DIAGNOSIS — E1165 Type 2 diabetes mellitus with hyperglycemia: Secondary | ICD-10-CM

## 2016-11-01 DIAGNOSIS — IMO0002 Reserved for concepts with insufficient information to code with codable children: Secondary | ICD-10-CM

## 2016-11-01 DIAGNOSIS — E785 Hyperlipidemia, unspecified: Secondary | ICD-10-CM | POA: Diagnosis not present

## 2016-11-01 DIAGNOSIS — E1169 Type 2 diabetes mellitus with other specified complication: Secondary | ICD-10-CM

## 2016-11-01 NOTE — Progress Notes (Signed)
Location:   [facility] Room Number: 159 A Place of Service:  SNF (31)   CODE STATUS: Full Code  No Known Allergies  Chief Complaint  Patient presents with  . Medical Management of Chronic Issues    1 month follow up    HPI:  He is a short term resident of this facility being seen for the management of his chronic illnesses. Overall his status is stable . He is not voicing any complaints at this time. There are no nursing concerns at this time.   Past Medical History:  Diagnosis Date  . Blind right eye   . COPD (chronic obstructive pulmonary disease) ([place])   . Diabetes mellitus without complication ([place])   . Hypertension     Past Surgical History:  Procedure Laterality Date  . IVC FILTER INSERTION N/A 08/24/2016   Procedure: IVC Filter Insertion;  Surgeon: Katha Cabal, MD;  Location: [facility];  Service: Cardiovascular;  Laterality: N/A;    Social History   Social History  . Marital status: Single    Spouse name: N/A  . Number of children: N/A  . Years of education: N/A   Occupational History  . Retired Administrator    Social History Main Topics  . Smoking status: Current Every Day Smoker    Packs/day: 1.50    Years: 45.00    Types: Cigarettes  . Smokeless tobacco: Never Used  . Alcohol use 0.0 oz/week     Comment: occasional (once every couple months)  . Drug use: No  . Sexual activity: No   Other Topics Concern  . Not on file   Social History Narrative  . No narrative on file   Family History  Problem Relation Age of Onset  . Cancer Mother   . Heart disease Father       VITAL SIGNS BP 113/66   Pulse 70   Temp 98.1 F (36.7 C)   Resp 17   Ht 5\' 9"  (1.753 m)   Wt 186 lb (84.4 kg)   SpO2 92%   BMI 27.47 kg/m   Patient's Medications  New Prescriptions   No medications on file  Previous Medications   ACETAMINOPHEN (TYLENOL) 325 MG TABLET    Take 650 mg by mouth every 6 (six) hours as needed.   ATORVASTATIN (LIPITOR) 40 MG TABLET    Take 40 mg by mouth daily at 6 PM.   EZETIMIBE (ZETIA) 10 MG TABLET    Take 1 tablet (10 mg total) by mouth daily at 6 PM.   FAMOTIDINE (PEPCID) 40 MG TABLET    Take 1 tablet (40 mg total) by mouth 2 (two) times daily.   FLUTICASONE (FLONASE) 50 MCG/ACT NASAL SPRAY    Place 1 spray into both nostrils daily.   FLUTICASONE-SALMETEROL (ADVAIR) 100-50 MCG/DOSE AEPB    Inhale 1 puff into the lungs 2 (two) times daily.   INSULIN ASPART (NOVOLOG) 100 UNIT/ML INJECTION    Inject as per sliding scale subcutaneously after meals: 0-149 = 0 150+ = 5 units   INSULIN GLARGINE (LANTUS) 100 UNIT/ML INJECTION    Inject 45 Units into the skin at bedtime.   LISINOPRIL (PRINIVIL,ZESTRIL) 20 MG TABLET    Take 20 mg by mouth daily.   METFORMIN (GLUCOPHAGE) 1000 MG TABLET    Take 1,000 mg by mouth 2 (two) times daily with a meal.   METOPROLOL TARTRATE (LOPRESSOR) 25 MG TABLET    Take 0.5 tablets (12.5 mg total)  by mouth 2 (two) times daily.   RIVAROXABAN (XARELTO) 20 MG TABS TABLET    Take 1 tablet (20 mg total) by mouth daily with supper. Start on 10/08/16   SENNA-DOCUSATE (SENOKOT-S) 8.6-50 MG TABLET    Take 1 tablet by mouth at bedtime.   TAMSULOSIN (FLOMAX) 0.4 MG CAPS CAPSULE    Take 1 capsule (0.4 mg total) by mouth daily after supper.   TIZANIDINE (ZANAFLEX) 2 MG TABLET    Take 1 tablet (2 mg total) by mouth at bedtime.  Modified Medications   No medications on file  Discontinued Medications     SIGNIFICANT DIAGNOSTIC EXAMS  08-21-16: ct of head: 1. Evolving acute infarct at the right parietal and temporal lobes, and right basal ganglia. Mild mass-effect on the frontal horn of the right lateral ventricle. No midline shift seen. No evidence of hemorrhagic transformation at this time. 2. Minimally displaced fracture through the distal tip of the nasal bone, of indeterminate age.  08-22-16: 2-d echo: - Left ventricle: The cavity size was normal. Systolic function was normal.  The estimated ejection fraction was 60%. Hypokinesis of the anteroseptal myocardium. Doppler parameters are consistent with abnormal left ventricular relaxation (grade 1 diastolic dysfunction). - Aortic valve: Valve area (Vmax): 1.39 cm^2. - Mitral valve: There was mild regurgitation. - Left atrium: The atrium was mildly dilated. - Right ventricle: The cavity size was mildly dilated.  08-22-16: carotid doppler: 1. 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 2. Significant plaque at the level of the left carotid bulb and proximal ICA causing significant proximal left ICA stenosis. Estimated stenosis is greater than 70%.  08-22-16: mri/mra head: 1. Acute to early subacute large right MCA infarct. 2. 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 3. Small chronic right frontal infarct.   08-24-16: left lower extremity doppler: Nonocclusive thrombosis within the LEFT common femoral vein and femoral vein. Decreased clot burden to ultrasound 05/04/2016. No new thrombus present.   09-09-16: abdominal aorta screening ultrasound: 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 CTA is recommended for further evaluation. Vascular surgery consultation recommended due to increased risk of rupture for AAA >5.5 cm.  09-13-16: ct angio of chest abdomen and pelvis: 1. Solitary acute PE in the medial basal segment right lower lobe pulmonary artery branch. 2. 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 3. 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 4. Infrarenal retrievable IVC filter. 5. Curvilinear calcifications along the left posterolateral urinary bladder wall may represent small layering calculi versus partially calcified bladder wall lesion.     09-14-16: bilateral lower extremity doppler: - No evidence of deep vein thrombosis involving the right lower extremity. - Findings consistent with subacute deep vein thrombosis involving   the left common femoral vein and left femoral vein. - No evidence of Baker&'s cyst on the right or left.  09-16-16: left upper extremity doppler: No evidence of deep vein or superficial thrombosis involving the left upper extremity.   LABS REVIEWED:   08-22-16: hgb a1c 10.5; chol 193; trig 441; hdl 35 09-15-16: wbc 11.6; hgb 15.5; hct 45.5 ;mcv 97.2; plt 198; glucose 86; bun 16; creat 0.86; k+ 3.5; na++ 137 09-22-16; wbc 11.6; hgb 15.9; hct 46.5 mcv 97.5; plt 200; glucose 86; bun 15;  creat 0.91; k+ 3.7; na++ 140; liver normal albumin 3.6   Review of Systems  Constitutional: Negative for malaise/fatigue.  Respiratory: Negative for cough and shortness of breath.   Cardiovascular: Negative for chest pain, palpitations and leg swelling.  Gastrointestinal: Negative for abdominal pain, constipation and heartburn.  Musculoskeletal: Negative for back pain, joint pain and myalgias.  Skin: Negative.   Neurological: Negative for dizziness.  Psychiatric/Behavioral: The patient is not nervous/anxious.     Physical Exam  Constitutional: He appears well-developed and well-nourished. No distress.  Eyes: Conjunctivae are normal.  Blind right eye   Neck: Neck supple. No JVD present. No thyromegaly present.  Cardiovascular: Normal rate, regular rhythm and intact distal pulses.   Respiratory: Effort normal and breath sounds normal. No respiratory distress. He has no wheezes.  GI: Soft. Bowel sounds are normal. He exhibits no distension. There is no tenderness.  Musculoskeletal: He exhibits no edema.  Left hemiparesis Able to move right extremities.   Lymphadenopathy:    He has no cervical adenopathy.  Neurological: He is alert.  Skin: Skin is warm and dry. He is not diaphoretic.  Psychiatric: He has a normal mood  and affect.      ASSESSMENT/ PLAN:  1. Pulmonary embolism: has DVT of left femoral vein: is status post IVC filter placement (08-24-16) will continue xarelto 10 mg  daily   2. 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 Hypertension:b/p 113/60  will continue lisinopril 40 mg daily   4. Diabetes: hgb a1c 10.5; will continue lantus 45 units daily and novolog 5 units with meals metformin 1 gm twice daily   5. Dyslipidemia: trig 441; will continue zetia 10 mg daily and lipitor 40 mg daily  6.  COPD: will continue advair 100/50 twice daily and flonase daily   7. gerd: will continue pepcid 40 mg twice daily   8. Dysphagia: on thin liquids; no signs of aspiration; will monitor   9. BPH: will continue flomax 0.4 mg daily   10. Constipation: will continue senna s daily   11. Left hemiplegia: will continue zanaflex 2 mg nightly for spasticity  12. AAA without rupture: 6.8 cm: was seen by vascular while in hospital (08-24-16)  will monitor   [place] NP [facility]  Contact [phone] Monday through Friday 8am- 5pm  After hours call [phone]

## 2016-11-08 ENCOUNTER — Ambulatory Visit (INDEPENDENT_AMBULATORY_CARE_PROVIDER_SITE_OTHER): Payer: Self-pay | Admitting: Vascular Surgery

## 2016-11-08 ENCOUNTER — Encounter (INDEPENDENT_AMBULATORY_CARE_PROVIDER_SITE_OTHER): Payer: Self-pay | Admitting: Vascular Surgery

## 2016-11-08 VITALS — BP 139/82 | HR 76 | Resp 16 | Ht 69.0 in | Wt 186.0 lb

## 2016-11-08 DIAGNOSIS — Z794 Long term (current) use of insulin: Secondary | ICD-10-CM

## 2016-11-08 DIAGNOSIS — I714 Abdominal aortic aneurysm, without rupture, unspecified: Secondary | ICD-10-CM

## 2016-11-08 DIAGNOSIS — IMO0002 Reserved for concepts with insufficient information to code with codable children: Secondary | ICD-10-CM

## 2016-11-08 DIAGNOSIS — E1140 Type 2 diabetes mellitus with diabetic neuropathy, unspecified: Secondary | ICD-10-CM

## 2016-11-08 DIAGNOSIS — I82412 Acute embolism and thrombosis of left femoral vein: Secondary | ICD-10-CM

## 2016-11-08 DIAGNOSIS — I6523 Occlusion and stenosis of bilateral carotid arteries: Secondary | ICD-10-CM

## 2016-11-08 DIAGNOSIS — I63511 Cerebral infarction due to unspecified occlusion or stenosis of right middle cerebral artery: Secondary | ICD-10-CM

## 2016-11-08 DIAGNOSIS — E782 Mixed hyperlipidemia: Secondary | ICD-10-CM

## 2016-11-08 DIAGNOSIS — E1165 Type 2 diabetes mellitus with hyperglycemia: Secondary | ICD-10-CM

## 2016-11-08 DIAGNOSIS — E785 Hyperlipidemia, unspecified: Secondary | ICD-10-CM | POA: Insufficient documentation

## 2016-11-08 NOTE — Progress Notes (Signed)
MRN : [id number]  SIRAJ DERMODY is a 61 y.o. (09/27/1955) male who presents with chief complaint of No chief complaint on file. Marland Kitchen  History of Present Illness: The patient is seen for follow up evaluation of carotid stenosis status post CVA. CT scan was done of the abdomen and pelvis but not of the carotids.   The patient denies interval amaurosis fugax. There is no recent or interval TIA symptoms or focal motor deficits. There is no prior documented CVA.  The patient is taking enteric-coated aspirin 81 mg daily.  There is no history of migraine headaches. There is no history of seizures.  The patient also presents to the office for evaluation of an abdominal aortic aneurysm. The aneurysm was found incidentally by CT scan. Patient denies abdominal pain or unusual back pain, no other abdominal complaints.  No history of an acute onset of painful blue discoloration of the toes.     No family history of AAA.   There is also a history of DVT and he has an IVC filter  The patient has a history of coronary artery disease, no recent episodes of angina or shortness of breath. The patient denies PAD or claudication symptoms. There is a history of hyperlipidemia which is being treated with a statin.    CT scan shows an AAA that measures 6.5 cm  Carotid ultrasound shows >70% stenosis of the LICA with occlusion of the RICA  No outpatient prescriptions have been marked as taking for the 11/08/16 encounter (Appointment) with Delana Klayman, Dolores Lory, MD.    Past Medical History:  Diagnosis Date  . Blind right eye   . COPD (chronic obstructive pulmonary disease) ([place])   . Diabetes mellitus without complication ([place])   . Hypertension     Past Surgical History:  Procedure Laterality Date  . IVC FILTER INSERTION N/A 08/24/2016   Procedure: IVC Filter Insertion;  Surgeon: Katha Cabal, MD;  Location: [facility];  Service: Cardiovascular;  Laterality: N/A;    Social History Social  History  Substance Use Topics  . Smoking status: Current Every Day Smoker    Packs/day: 1.50    Years: 45.00    Types: Cigarettes  . Smokeless tobacco: Never Used  . Alcohol use 0.0 oz/week     Comment: occasional (once every couple months)    Family History Family History  Problem Relation Age of Onset  . Cancer Mother   . Heart disease Father     No Known Allergies   REVIEW OF SYSTEMS (Negative unless checked)  Constitutional: [] Weight loss  [] Fever  [] Chills Cardiac: [] Chest pain   [] Chest pressure   [] Palpitations   [] Shortness of breath when laying flat   [] Shortness of breath with exertion. Vascular:  [] Pain in legs with walking   [] Pain in legs at rest  [x] History of DVT   [] Phlebitis   [x] Swelling in legs   [] Varicose veins   [] Non-healing ulcers Pulmonary:   [] Uses home oxygen   [] Productive cough   [] Hemoptysis   [] Wheeze  [] COPD   [] Asthma Neurologic:  [] Dizziness   [] Seizures   [x] History of stroke   [] History of TIA  [] Aphasia   [] Vissual changes   [x] Weakness or numbness in arm   [x] Weakness or numbness in leg Musculoskeletal:   [] Joint swelling   [] Joint pain   [] Low back pain Hematologic:  [] Easy bruising  [] Easy bleeding   [] Hypercoagulable state   [] Anemic Gastrointestinal:  [] Diarrhea   [] Vomiting  [] Gastroesophageal reflux/heartburn   []   Difficulty swallowing. Genitourinary:  [] Chronic kidney disease   [] Difficult urination  [] Frequent urination   [] Blood in urine Skin:  [] Rashes   [] Ulcers  Psychological:  [] History of anxiety   []  History of major depression.  Physical Examination  There were no vitals filed for this visit. There is no height or weight on file to calculate BMI. Gen: WD/WN, NAD Head: [place]/AT, No temporalis wasting.  Ear/Nose/Throat: Hearing grossly intact, nares w/o erythema or drainage Eyes: PER, EOMI, sclera nonicteric.  Neck: Supple, no large masses.   Pulmonary:  Good air movement, no audible wheezing bilaterally, no use of accessory  muscles.  Cardiac: RRR, no JVD Vascular: bilateral carotid bruits Vessel Right Left  Radial Palpable Palpable  Ulnar Palpable Palpable  Brachial Palpable Palpable  Carotid Palpable Palpable  Femoral Palpable Palpable  Popliteal Palpable Palpable  PT Palpable Palpable  DP Palpable Palpable  Gastrointestinal: Non-distended. No guarding/no peritoneal signs.  Musculoskeletal: M/S 5/5 throughout the right side 1/5 left side.  No deformity or atrophy.  Neurologic: CN 2-12 intact. Symmetrical.  Speech is fluent. Motor exam as listed above. Psychiatric: Judgment intact, Mood & affect appropriate for pt's clinical situation. Dermatologic: No rashes or ulcers noted.  No changes consistent with cellulitis. Lymph : No lichenification or skin changes of chronic lymphedema.  CBC Lab Results  Component Value Date   WBC 12.1 (H) 09/29/2016   HGB 15.7 09/29/2016   HCT 47.7 09/29/2016   MCV 98.6 09/29/2016   PLT 202 09/29/2016    BMET    Component Value Date/Time   NA 140 09/22/2016 0634   K 3.7 09/22/2016 0634   CL 104 09/22/2016 0634   CO2 25 09/22/2016 0634   GLUCOSE 86 09/22/2016 0634   BUN 15 09/22/2016 0634   CREATININE 0.91 09/22/2016 0634   CALCIUM 9.3 09/22/2016 0634   GFRNONAA >60 09/22/2016 0634   GFRAA >60 09/22/2016 0634   CrCl cannot be calculated (Patient's most recent lab result is older than the maximum 21 days allowed.).  COAG Lab Results  Component Value Date   INR 1.00 08/21/2016    Radiology No results found.  Assessment/Plan 1. Acute right MCA stroke (HCC) Continue antiplatelet therapy and anticoagulation as ordered  2. Bilateral carotid artery stenosis The patient is symptomatic with respect to his bilateral carotid stenosis.  The patient has a lesion that is >70% with the right ICA occlusion.  Patient should undergo CT angiography of the carotid arteries to define the degree of stenosis of the internal carotid arteries bilaterally and the anatomic  suitability for surgery vs. intervention.  The patient will need surgery cardiac clearance, once cleared the patient will be scheduled for surgery.  The risks, benefits and alternative therapies were reviewed in detail with the patient.  All questions were answered.  The patient agrees to proceed with imaging.  Continue antiplatelet therapy as prescribed. Continue management of CAD, HTN and Hyperlipidemia. Healthy heart diet, encouraged exercise at least 4 times per week.    3. AAA (abdominal aortic aneurysm) without rupture ([place]) He is a candidate for endovascular stenting so once his carotid is treated we will move forward with stent graft placement.  4. Acute deep vein thrombosis (DVT) of femoral vein of left lower extremity (HCC) Continue Xarelto as ordered  A filter is already in place  5. Uncontrolled type 2 diabetes mellitus with diabetic neuropathy, with long-term current use of insulin ([place]) Continue hypoglycemic medications as already ordered, these medications have been reviewed and there are no  changes at this time.  Hgb A1C to be monitored as already arranged by primary service   [name], MD  11/08/2016 1:42 PM

## 2016-11-10 ENCOUNTER — Telehealth: Payer: Self-pay | Admitting: Physical Medicine & Rehabilitation

## 2016-11-10 NOTE — Telephone Encounter (Signed)
Called ptn and advised Chad Combs is willing to use botox samples as he has no insurance but he will need to pay $125 dos of profee.  He states son is working on [employer] pending - he will have son call back with status in case he wants to postpone appt.

## 2016-11-22 ENCOUNTER — Ambulatory Visit: Payer: Self-pay | Admitting: Physical Medicine & Rehabilitation

## 2016-11-22 ENCOUNTER — Encounter: Payer: Self-pay | Attending: Physical Medicine & Rehabilitation

## 2016-11-22 DIAGNOSIS — F17210 Nicotine dependence, cigarettes, uncomplicated: Secondary | ICD-10-CM | POA: Insufficient documentation

## 2016-11-22 DIAGNOSIS — E119 Type 2 diabetes mellitus without complications: Secondary | ICD-10-CM | POA: Insufficient documentation

## 2016-11-22 DIAGNOSIS — H5461 Unqualified visual loss, right eye, normal vision left eye: Secondary | ICD-10-CM | POA: Insufficient documentation

## 2016-11-22 DIAGNOSIS — I10 Essential (primary) hypertension: Secondary | ICD-10-CM | POA: Insufficient documentation

## 2016-11-22 DIAGNOSIS — I69398 Other sequelae of cerebral infarction: Secondary | ICD-10-CM | POA: Insufficient documentation

## 2016-11-22 DIAGNOSIS — G8112 Spastic hemiplegia affecting left dominant side: Secondary | ICD-10-CM | POA: Insufficient documentation

## 2016-11-22 DIAGNOSIS — J449 Chronic obstructive pulmonary disease, unspecified: Secondary | ICD-10-CM | POA: Insufficient documentation

## 2016-11-29 ENCOUNTER — Telehealth: Payer: Self-pay

## 2016-11-29 ENCOUNTER — Ambulatory Visit: Payer: Self-pay | Admitting: Neurology

## 2016-11-29 NOTE — Telephone Encounter (Signed)
Pt no showed his new pt appt today. 

## 2016-11-30 ENCOUNTER — Encounter: Payer: Self-pay | Admitting: Neurology

## 2017-05-02 ENCOUNTER — Ambulatory Visit (INDEPENDENT_AMBULATORY_CARE_PROVIDER_SITE_OTHER): Payer: Medicaid Other | Admitting: Vascular Surgery

## 2017-05-19 ENCOUNTER — Observation Stay
Admission: EM | Admit: 2017-05-19 | Discharge: 2017-05-22 | Disposition: A | Discharge status: Skilled Nursing Facility | Payer: Medicaid Other | Attending: Internal Medicine | Admitting: Internal Medicine

## 2017-05-19 ENCOUNTER — Other Ambulatory Visit: Payer: Self-pay

## 2017-05-19 ENCOUNTER — Encounter: Payer: Self-pay | Admitting: Emergency Medicine

## 2017-05-19 ENCOUNTER — Emergency Department: Payer: Medicaid Other

## 2017-05-19 DIAGNOSIS — Z7901 Long term (current) use of anticoagulants: Secondary | ICD-10-CM | POA: Insufficient documentation

## 2017-05-19 DIAGNOSIS — IMO0002 Reserved for concepts with insufficient information to code with codable children: Secondary | ICD-10-CM | POA: Diagnosis present

## 2017-05-19 DIAGNOSIS — E1149 Type 2 diabetes mellitus with other diabetic neurological complication: Secondary | ICD-10-CM | POA: Insufficient documentation

## 2017-05-19 DIAGNOSIS — E876 Hypokalemia: Secondary | ICD-10-CM | POA: Insufficient documentation

## 2017-05-19 DIAGNOSIS — N390 Urinary tract infection, site not specified: Secondary | ICD-10-CM | POA: Diagnosis present

## 2017-05-19 DIAGNOSIS — J449 Chronic obstructive pulmonary disease, unspecified: Secondary | ICD-10-CM | POA: Diagnosis present

## 2017-05-19 DIAGNOSIS — G9340 Encephalopathy, unspecified: Secondary | ICD-10-CM | POA: Diagnosis present

## 2017-05-19 DIAGNOSIS — E1165 Type 2 diabetes mellitus with hyperglycemia: Secondary | ICD-10-CM | POA: Insufficient documentation

## 2017-05-19 DIAGNOSIS — E785 Hyperlipidemia, unspecified: Secondary | ICD-10-CM | POA: Diagnosis not present

## 2017-05-19 DIAGNOSIS — Z79899 Other long term (current) drug therapy: Secondary | ICD-10-CM | POA: Insufficient documentation

## 2017-05-19 DIAGNOSIS — I10 Essential (primary) hypertension: Secondary | ICD-10-CM | POA: Diagnosis present

## 2017-05-19 DIAGNOSIS — I69354 Hemiplegia and hemiparesis following cerebral infarction affecting left non-dominant side: Secondary | ICD-10-CM | POA: Diagnosis not present

## 2017-05-19 DIAGNOSIS — F17210 Nicotine dependence, cigarettes, uncomplicated: Secondary | ICD-10-CM | POA: Diagnosis not present

## 2017-05-19 DIAGNOSIS — Z993 Dependence on wheelchair: Secondary | ICD-10-CM | POA: Diagnosis not present

## 2017-05-19 DIAGNOSIS — Z794 Long term (current) use of insulin: Secondary | ICD-10-CM | POA: Diagnosis not present

## 2017-05-19 DIAGNOSIS — N3001 Acute cystitis with hematuria: Secondary | ICD-10-CM | POA: Diagnosis present

## 2017-05-19 DIAGNOSIS — I714 Abdominal aortic aneurysm, without rupture: Secondary | ICD-10-CM | POA: Diagnosis not present

## 2017-05-19 DIAGNOSIS — H5461 Unqualified visual loss, right eye, normal vision left eye: Secondary | ICD-10-CM | POA: Insufficient documentation

## 2017-05-19 DIAGNOSIS — R569 Unspecified convulsions: Secondary | ICD-10-CM

## 2017-05-19 DIAGNOSIS — M6281 Muscle weakness (generalized): Secondary | ICD-10-CM | POA: Insufficient documentation

## 2017-05-19 DIAGNOSIS — Z7982 Long term (current) use of aspirin: Secondary | ICD-10-CM | POA: Insufficient documentation

## 2017-05-19 LAB — DIFFERENTIAL
Basophils Absolute: 0.1 10*3/uL (ref 0–0.1)
Basophils Relative: 0 %
EOS ABS: 0 10*3/uL (ref 0–0.7)
EOS PCT: 0 %
LYMPHS ABS: 0.7 10*3/uL — AB (ref 1.0–3.6)
LYMPHS PCT: 4 %
MONOS PCT: 5 %
Monocytes Absolute: 1 10*3/uL (ref 0.2–1.0)
NEUTROS PCT: 91 %
Neutro Abs: 18.3 10*3/uL — ABNORMAL HIGH (ref 1.4–6.5)

## 2017-05-19 LAB — URINALYSIS, COMPLETE (UACMP) WITH MICROSCOPIC
Bacteria, UA: NONE SEEN
Bilirubin Urine: NEGATIVE
GLUCOSE, UA: NEGATIVE mg/dL
Ketones, ur: NEGATIVE mg/dL
Nitrite: NEGATIVE
PH: 5 (ref 5.0–8.0)
Protein, ur: 30 mg/dL — AB
SQUAMOUS EPITHELIAL / LPF: NONE SEEN

## 2017-05-19 LAB — HEPATIC FUNCTION PANEL
ALBUMIN: 3.8 g/dL (ref 3.5–5.0)
ALK PHOS: 64 U/L (ref 38–126)
ALT: 17 U/L (ref 17–63)
AST: 28 U/L (ref 15–41)
BILIRUBIN DIRECT: 0.4 mg/dL (ref 0.1–0.5)
BILIRUBIN TOTAL: 1.9 mg/dL — AB (ref 0.3–1.2)
Indirect Bilirubin: 1.5 mg/dL — ABNORMAL HIGH (ref 0.3–0.9)
Total Protein: 7.1 g/dL (ref 6.5–8.1)

## 2017-05-19 LAB — BASIC METABOLIC PANEL
Anion gap: 12 (ref 5–15)
BUN: 14 mg/dL (ref 6–20)
CALCIUM: 9.2 mg/dL (ref 8.9–10.3)
CO2: 20 mmol/L — ABNORMAL LOW (ref 22–32)
Chloride: 104 mmol/L (ref 101–111)
Creatinine, Ser: 0.91 mg/dL (ref 0.61–1.24)
GFR calc Af Amer: >60 mL/min (ref >60)
GLUCOSE: 191 mg/dL — AB (ref 65–99)
POTASSIUM: 3.7 mmol/L (ref 3.5–5.1)
SODIUM: 136 mmol/L (ref 135–145)

## 2017-05-19 LAB — CBC
HEMATOCRIT: 45.6 % (ref 40.0–52.0)
HEMOGLOBIN: 15.4 g/dL (ref 13.0–18.0)
MCH: 32.8 pg (ref 26.0–34.0)
MCHC: 33.8 g/dL (ref 32.0–36.0)
MCV: 97.1 fL (ref 80.0–100.0)
Platelets: 162 10*3/uL (ref 150–440)
RBC: 4.7 MIL/uL (ref 4.40–5.90)
RDW: 13.4 % (ref 11.5–14.5)
WBC: 20.1 10*3/uL — ABNORMAL HIGH (ref 3.8–10.6)

## 2017-05-19 LAB — TROPONIN I: Troponin I: <0.03 ng/mL (ref <0.03)

## 2017-05-19 MED ORDER — FENTANYL CITRATE (PF) 100 MCG/2ML IJ SOLN
100.0000 ug | INTRAMUSCULAR | Status: DC | PRN
  Administered 2017-05-19: 100 ug via INTRAVENOUS
  Filled 2017-05-19: qty 2

## 2017-05-19 MED ORDER — IOPAMIDOL (ISOVUE-370) INJECTION 76%
100.0000 mL | Freq: Once | INTRAVENOUS | Status: AC | PRN
  Administered 2017-05-19: 100 mL via INTRAVENOUS

## 2017-05-19 MED ORDER — SODIUM CHLORIDE 0.9 % IV BOLUS (SEPSIS)
1000.0000 mL | Freq: Once | INTRAVENOUS | Status: AC
  Administered 2017-05-20: 1000 mL via INTRAVENOUS

## 2017-05-19 MED ORDER — CEFTRIAXONE SODIUM IN DEXTROSE 20 MG/ML IV SOLN
1.0000 g | Freq: Once | INTRAVENOUS | Status: AC
  Administered 2017-05-20: 1 g via INTRAVENOUS
  Filled 2017-05-19: qty 50

## 2017-05-19 NOTE — ED Notes (Signed)
Family at bedside. 

## 2017-05-19 NOTE — ED Notes (Signed)
Pt able to void with urinal

## 2017-05-19 NOTE — ED Provider Notes (Signed)
[facility] Emergency Department Provider Note    First MD Initiated Contact with Patient 05/19/17 1953     (approximate)  I have reviewed the triage vital signs and the nursing notes.   HISTORY  Chief Complaint Weakness    HPI Chad Combs is a 61 y.o. male with multiple chronic medical conditions including previous stroke as well as history of PE DVT status post IVC filter as well as COPD, known carotid artery disease as well as AAA being evaluated for possible surgical fixation who presents with report of seizure-like activity that occurred early this morning around 8 AM while patient was at the nursing home.  Patient states he was talking to his son and felt his back stiffen up and he was shaking all over.  States that his eyes rolled up into the back of his head.  I asked the patient if he heard this from other staff members that saw this.  Instead he says "no, I remember this."  There was no postictal period.  Has never had a similar episode in the past.  No recent fevers.  States that he does have some tightness in his back.  Denies any abdominal pain.  Past Medical History:  Diagnosis Date  . Blind right eye   . COPD (chronic obstructive pulmonary disease) ([place])   . Diabetes mellitus without complication ([place])   . Hypertension    Family History  Problem Relation Age of Onset  . Cancer Mother   . Heart disease Father    Past Surgical History:  Procedure Laterality Date  . IVC FILTER INSERTION N/A 08/24/2016   Procedure: IVC Filter Insertion;  Surgeon: Katha Cabal, MD;  Location: [facility];  Service: Cardiovascular;  Laterality: N/A;   Patient Active Problem List   Diagnosis Date Noted  . Hyperlipidemia 11/08/2016  . Type II diabetes mellitus with neurological manifestations, uncontrolled ([place]) 10/04/2016  . Dyslipidemia associated with type 2 diabetes mellitus ([place]) 10/04/2016  . AAA (abdominal aortic aneurysm) without rupture  ([place])   . Acute deep vein thrombosis (DVT) of femoral vein of left lower extremity ([place])   . Pulmonary embolus ([place])   . Adjustment disorder   . Acute right MCA stroke ([place]) 08/25/2016  . Left hemiplegia ([place])   . Dysarthria, post-stroke   . Dysphagia, post-stroke   . Carotid stenosis   . Benign essential HTN   . Leukocytosis   . Polycythemia vera ([place])   . Tobacco abuse   . Chronic obstructive pulmonary disease ([place])   . 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      Prior to Admission medications   Medication Sig Start Date End Date Taking? Authorizing Provider  acetaminophen (TYLENOL) 500 MG tablet Take 500 mg by mouth at bedtime as needed.    Yes [provider]  aspirin 81 MG chewable tablet Chew 81 mg by mouth daily.   Yes [provider]  bisacodyl (BISACODYL) 5 MG EC tablet Take 10 mg by mouth daily as needed for moderate constipation.   Yes [provider]  budesonide-formoterol (SYMBICORT) 80-4.5 MCG/ACT inhaler Inhale 2 puffs into the lungs every 12 (twelve) hours.   Yes [provider]  clopidogrel (PLAVIX) 75 MG tablet Take 75 mg by mouth daily.   Yes [provider]  diclofenac sodium (VOLTAREN) 1 % GEL Apply 2-4 g topically 2 (two) times daily. APPLY 2MG  TO LEFT SHOULDER TWICE DAILY AND 4 GRAMS TO LEFT HIP AT BEDTIME  Yes [provider]  ezetimibe (ZETIA) 10 MG tablet Take 1 tablet (10 mg total) by mouth daily at 6 PM. Patient taking differently: Take 10 mg by mouth daily.  10/01/16  Yes Love, Ivan Anchors, PA-C  famotidine (PEPCID) 40 MG tablet Take 1 tablet (40 mg total) by mouth 2 (two) times daily. 10/01/16  Yes Love, Ivan Anchors, PA-C  fluticasone (FLONASE) 50 MCG/ACT nasal spray Place 1 spray into both nostrils daily. 08/25/16  Yes Max Sane, MD  HYDROcodone-acetaminophen (NORCO/VICODIN) 5-325 MG tablet Take 1 tablet by mouth daily as needed for moderate pain.   Yes [provider]  insulin aspart (NOVOLOG) 100 UNIT/ML  injection Inject 0-9 Units into the skin 3 (three) times daily. SLIDING SCALE: 0-99 (0u), 100-149 (2u), 150-199 (3u), 200-249 (4u), 250-299 (5u), 300-349 (6u), 350-399 (7u), 400-449 (8u), 450-499 (9u) - IF 500+ CONTACT PHYSICIAN   Yes [provider]  insulin glargine (LANTUS) 100 UNIT/ML injection Inject 45 Units into the skin at bedtime.   Yes [provider]  lisinopril (PRINIVIL,ZESTRIL) 20 MG tablet Take 20 mg by mouth daily.   Yes [provider]  Melatonin 5 MG TABS Take 1 tablet by mouth at bedtime as needed.   Yes [provider]  metFORMIN (GLUCOPHAGE) 1000 MG tablet Take 1,000 mg by mouth 2 (two) times daily with a meal.   Yes [provider]  metoprolol tartrate (LOPRESSOR) 25 MG tablet Take 0.5 tablets (12.5 mg total) by mouth 2 (two) times daily. 10/06/16  Yes Hendricks Limes, MD  polyethylene glycol [facility] / [facility]) packet Take 17 g by mouth daily. MIX 17 GRAMS IN 4 TO 8 OUNCES WATER AND GIVE EVERY MORNING   Yes [provider]  senna (SENOKOT) 8.6 MG TABS tablet Take 1 tablet by mouth 2 (two) times daily.   Yes [provider]  sertraline (ZOLOFT) 50 MG tablet Take 50 mg by mouth daily.   Yes [provider]  tamsulosin (FLOMAX) 0.4 MG CAPS capsule Take 1 capsule (0.4 mg total) by mouth daily after supper. 10/01/16  Yes Love, Ivan Anchors, PA-C  tiZANidine (ZANAFLEX) 2 MG tablet Take 1 tablet (2 mg total) by mouth at bedtime. Patient taking differently: Take 2-4 mg by mouth 4 (four) times daily. TAKE 4MG  BY MOUTH THREE TIMES DAILY AND 2MG  BY MOUTH AT BEDTIME 10/01/16  Yes Love, Ivan Anchors, PA-C  rivaroxaban (XARELTO) 20 MG TABS tablet Take 1 tablet (20 mg total) by mouth daily with supper. Start on 10/08/16 Patient not taking: Reported on 05/19/2017 10/08/16   Love, Ivan Anchors, PA-C  senna-docusate (SENOKOT-S) 8.6-50 MG tablet Take 1 tablet by mouth at bedtime. Patient not taking: Reported on 05/19/2017 10/01/16   Bary Leriche, PA-C    Allergies Patient has no known allergies.    Social History Social History   Tobacco Use  . Smoking status: Current Every Day Smoker    Packs/day: 1.50    Years: 45.00    Pack years: 67.50    Types: Cigarettes  . Smokeless tobacco: Never Used  Substance Use Topics  . Alcohol use: Yes    Alcohol/week: 0.0 oz    Comment: occasional (once every couple months)  . Drug use: No    Review of Systems Patient denies headaches, rhinorrhea, blurry vision, numbness, shortness of breath, chest pain, edema, cough, abdominal pain, nausea, vomiting, diarrhea, dysuria, fevers, rashes or hallucinations unless otherwise stated above in HPI. ____________________________________________   PHYSICAL EXAM:  VITAL SIGNS: Vitals:  05/19/17 2230 05/19/17 2300  BP: (!) 125/58 140/69  Pulse:  79  Resp:    Temp:    SpO2:  95%    Constitutional: Alert chronically ill appearing in no acute distress. Eyes: Conjunctivae are normal.  Head: Atraumatic. Nose: No congestion/rhinnorhea. Mouth/Throat: Mucous membranes are moist.   Neck: No stridor. Painless ROM.  Cardiovascular: Normal rate, regular rhythm. Grossly normal heart sounds.  Good peripheral circulation. Respiratory: Normal respiratory effort.  No retractions. Lungs CTAB. Gastrointestinal: Soft and nontender. No distention. No abdominal bruits appreciated. No CVA tenderness. Genitourinary: normal external genitalia Musculoskeletal: No lower extremity tenderness nor edema.  No joint effusions. Neurologic:  Normal speech and language. Chronic left sided weakness Skin:  Skin is warm, dry and intact. No rash noted. Psychiatric: Mood and affect are normal. Speech and behavior are normal.  ____________________________________________   LABS (all labs ordered are listed, but only abnormal results are displayed)  Results for orders placed or performed during the hospital encounter of 05/19/17 (from the past 24 hour(s))    Basic metabolic panel     Status: Abnormal   Collection Time: 05/19/17  7:50 PM  Result Value Ref Range   Sodium 136 135 - 145 mmol/L   Potassium 3.7 3.5 - 5.1 mmol/L   Chloride 104 101 - 111 mmol/L   CO2 20 (L) 22 - 32 mmol/L   Glucose, Bld 191 (H) 65 - 99 mg/dL   BUN 14 6 - 20 mg/dL   Creatinine, Ser 0.91 0.61 - 1.24 mg/dL   Calcium 9.2 8.9 - 10.3 mg/dL   GFR calc non Af Amer >60 >60 mL/min   GFR calc Af Amer >60 >60 mL/min   Anion gap 12 5 - 15  Troponin I     Status: None   Collection Time: 05/19/17  7:50 PM  Result Value Ref Range   Troponin I <0.03 <0.03 ng/mL  Hepatic function panel     Status: Abnormal   Collection Time: 05/19/17  7:50 PM  Result Value Ref Range   Total Protein 7.1 6.5 - 8.1 g/dL   Albumin 3.8 3.5 - 5.0 g/dL   AST 28 15 - 41 U/L   ALT 17 17 - 63 U/L   Alkaline Phosphatase 64 38 - 126 U/L   Total Bilirubin 1.9 (H) 0.3 - 1.2 mg/dL   Bilirubin, Direct 0.4 0.1 - 0.5 mg/dL   Indirect Bilirubin 1.5 (H) 0.3 - 0.9 mg/dL  Urinalysis, Complete w Microscopic     Status: Abnormal   Collection Time: 05/19/17  8:21 PM  Result Value Ref Range   Color, Urine YELLOW (A) YELLOW   APPearance CLEAR (A) CLEAR   Specific Gravity, Urine >1.046 (H) 1.005 - 1.030   pH 5.0 5.0 - 8.0   Glucose, UA NEGATIVE NEGATIVE mg/dL   Hgb urine dipstick SMALL (A) NEGATIVE   Bilirubin Urine NEGATIVE NEGATIVE   Ketones, ur NEGATIVE NEGATIVE mg/dL   Protein, ur 30 (A) NEGATIVE mg/dL   Nitrite NEGATIVE NEGATIVE   Leukocytes, UA TRACE (A) NEGATIVE   RBC / HPF 6-30 0 - 5 RBC/hpf   WBC, UA TOO NUMEROUS TO COUNT 0 - 5 WBC/hpf   Bacteria, UA NONE SEEN NONE SEEN   Squamous Epithelial / LPF NONE SEEN NONE SEEN  CBC     Status: Abnormal   Collection Time: 05/19/17  8:21 PM  Result Value Ref Range   WBC 20.1 (H) 3.8 - 10.6 K/uL   RBC 4.70 4.40 - 5.90 MIL/uL  Hemoglobin 15.4 13.0 - 18.0 g/dL   HCT 45.6 40.0 - 52.0 %   MCV 97.1 80.0 - 100.0 fL   MCH 32.8 26.0 - 34.0 pg   MCHC 33.8 32.0  - 36.0 g/dL   RDW 13.4 11.5 - 14.5 %   Platelets 162 150 - 440 K/uL  Differential     Status: Abnormal   Collection Time: 05/19/17  8:21 PM  Result Value Ref Range   Neutrophils Relative % 91 %   Neutro Abs 18.3 (H) 1.4 - 6.5 K/uL   Lymphocytes Relative 4 %   Lymphs Abs 0.7 (L) 1.0 - 3.6 K/uL   Monocytes Relative 5 %   Monocytes Absolute 1.0 0.2 - 1.0 K/uL   Eosinophils Relative 0 %   Eosinophils Absolute 0.0 0 - 0.7 K/uL   Basophils Relative 0 %   Basophils Absolute 0.1 0 - 0.1 K/uL   ____________________________________________  EKG My review and personal interpretation at Time: 19:50   Indication: ams  Rate: 90  Rhythm: sinus Axis: normal Other: normal intervals, non speicifc st changes, no stemi ____________________________________________  RADIOLOGY  I personally reviewed all radiographic images ordered to evaluate for the above acute complaints and reviewed radiology reports and findings.  These findings were personally discussed with the patient.  Please see medical record for radiology report.  ____________________________________________   PROCEDURES  Procedure(s) performed:  Procedures    Critical Care performed: no ____________________________________________   INITIAL IMPRESSION / ASSESSMENT AND PLAN / ED COURSE  Pertinent labs & imaging results that were available during my care of the patient were reviewed by me and considered in my medical decision making (see chart for details).  DDX: Dehydration, sepsis, pna, uti, hypoglycemia, cva, drug effect, withdrawal, encephalitis  VENSON FERENCZ is a 61 y.o. who presents to the ED with presentation as described above.  Patient is chronically ill-appearing with multiple significant chronic comorbidities.  Based on his report of seizure-like activity and then having pain in his back I order CT abdomen and pelvis to evaluate for AAA rupture.  CT abdomen shows no acute abnormalities.  CT of the head ordered to  evaluate for evidence of bleed which could also cause altered mental status shows chronic encephalomalacia secondary to previous CVA.  Patient does have leukocytosis with bandemia.  Urinalysis was significant concentration.  Concerning for UTI therefore patient given IV fluids as well as a dose of IV antibiotics.  Based on his episode occurred today with altered mental status I do believe the patient would benefit from further medical management in the hospital.  Have discussed with the patient and available family all diagnostics and treatments performed thus far and all questions were answered to the best of my ability. The patient demonstrates understanding and agreement with plan.       ____________________________________________   FINAL CLINICAL IMPRESSION(S) / ED DIAGNOSES  Final diagnoses:  Acute encephalopathy  Acute cystitis with hematuria      NEW MEDICATIONS STARTED DURING THIS VISIT:  This SmartLink is deprecated. Use AVSMEDLIST instead to display the medication list for a patient.   Note:  This document was prepared using Dragon voice recognition software and may include unintentional dictation errors.    Merlyn Lot, MD 05/19/17 [phone]

## 2017-05-19 NOTE — ED Triage Notes (Addendum)
Pt arrives via [facility] after feeling like he was having "seizure-like activity" while on the phone with his son this morning around 0830. Pt reports his arms, back and legs stiffened up and he began shaking. Also reports severe low back pain. Pt states "my eyes felt like they were rolling back in my head." Pt states nurse at [facility] called pt's PCP today and MD encouraged pt to come get checked out at the hospital. Pt denies hx of seizures. Hx of stroke that affected left side of body this past March.

## 2017-05-20 ENCOUNTER — Observation Stay: Payer: Medicaid Other

## 2017-05-20 DIAGNOSIS — R569 Unspecified convulsions: Secondary | ICD-10-CM

## 2017-05-20 LAB — CBC
HEMATOCRIT: 43.8 % (ref 40.0–52.0)
HEMOGLOBIN: 14.6 g/dL (ref 13.0–18.0)
MCH: 32.9 pg (ref 26.0–34.0)
MCHC: 33.4 g/dL (ref 32.0–36.0)
MCV: 98.4 fL (ref 80.0–100.0)
Platelets: 138 10*3/uL — ABNORMAL LOW (ref 150–440)
RBC: 4.44 MIL/uL (ref 4.40–5.90)
RDW: 13.6 % (ref 11.5–14.5)
WBC: 14.3 10*3/uL — ABNORMAL HIGH (ref 3.8–10.6)

## 2017-05-20 LAB — BASIC METABOLIC PANEL
ANION GAP: 10 (ref 5–15)
BUN: 12 mg/dL (ref 6–20)
CALCIUM: 9 mg/dL (ref 8.9–10.3)
CHLORIDE: 104 mmol/L (ref 101–111)
CO2: 21 mmol/L — AB (ref 22–32)
Creatinine, Ser: 0.98 mg/dL (ref 0.61–1.24)
GFR calc non Af Amer: >60 mL/min (ref >60)
GLUCOSE: 146 mg/dL — AB (ref 65–99)
Potassium: 3.2 mmol/L — ABNORMAL LOW (ref 3.5–5.1)
Sodium: 135 mmol/L (ref 135–145)

## 2017-05-20 LAB — GLUCOSE, CAPILLARY
GLUCOSE-CAPILLARY: 138 mg/dL — AB (ref 65–99)
Glucose-Capillary: 132 mg/dL — ABNORMAL HIGH (ref 65–99)
Glucose-Capillary: 144 mg/dL — ABNORMAL HIGH (ref 65–99)
Glucose-Capillary: 163 mg/dL — ABNORMAL HIGH (ref 65–99)

## 2017-05-20 LAB — MRSA PCR SCREENING: MRSA BY PCR: NEGATIVE

## 2017-05-20 MED ORDER — FAMOTIDINE 20 MG PO TABS
40.0000 mg | ORAL_TABLET | Freq: Two times a day (BID) | ORAL | Status: DC
  Administered 2017-05-20 – 2017-05-22 (×6): 40 mg via ORAL
  Filled 2017-05-20 (×6): qty 2

## 2017-05-20 MED ORDER — INSULIN ASPART 100 UNIT/ML ~~LOC~~ SOLN: 0.0000 [IU] | Freq: Every day | SUBCUTANEOUS | Status: DC

## 2017-05-20 MED ORDER — INSULIN ASPART 100 UNIT/ML ~~LOC~~ SOLN
0.0000 [IU] | Freq: Three times a day (TID) | SUBCUTANEOUS | Status: DC
  Administered 2017-05-20 (×2): 1 [IU] via SUBCUTANEOUS
  Administered 2017-05-21 – 2017-05-22 (×3): 2 [IU] via SUBCUTANEOUS
  Filled 2017-05-20 (×5): qty 1

## 2017-05-20 MED ORDER — TIZANIDINE HCL 2 MG PO TABS
2.0000 mg | ORAL_TABLET | Freq: Every day | ORAL | Status: DC
  Administered 2017-05-21: 2 mg via ORAL
  Filled 2017-05-20 (×3): qty 1

## 2017-05-20 MED ORDER — SODIUM CHLORIDE 0.9 % IV BOLUS (SEPSIS)
500.0000 mL | Freq: Once | INTRAVENOUS | Status: AC
  Administered 2017-05-20: 500 mL via INTRAVENOUS

## 2017-05-20 MED ORDER — ACETAMINOPHEN 650 MG RE SUPP: 650.0000 mg | Freq: Four times a day (QID) | RECTAL | Status: DC | PRN

## 2017-05-20 MED ORDER — METOPROLOL TARTRATE 25 MG PO TABS
12.5000 mg | ORAL_TABLET | Freq: Two times a day (BID) | ORAL | Status: DC
  Administered 2017-05-20 – 2017-05-22 (×5): 12.5 mg via ORAL
  Filled 2017-05-20 (×5): qty 1

## 2017-05-20 MED ORDER — SODIUM CHLORIDE 0.9 % IV SOLN
INTRAVENOUS | Status: AC
  Administered 2017-05-20: 02:00:00 via INTRAVENOUS

## 2017-05-20 MED ORDER — TAMSULOSIN HCL 0.4 MG PO CAPS
0.4000 mg | ORAL_CAPSULE | Freq: Every day | ORAL | Status: DC
  Administered 2017-05-20 – 2017-05-21 (×2): 0.4 mg via ORAL
  Filled 2017-05-20 (×2): qty 1

## 2017-05-20 MED ORDER — DEXTROSE 5 % IV SOLN
2.0000 g | INTRAVENOUS | Status: DC
  Administered 2017-05-20 – 2017-05-21 (×2): 2 g via INTRAVENOUS
  Filled 2017-05-20 (×3): qty 2

## 2017-05-20 MED ORDER — FUROSEMIDE 10 MG/ML IJ SOLN: 20.0000 mg | Freq: Once | INTRAMUSCULAR | Status: DC

## 2017-05-20 MED ORDER — TIZANIDINE HCL 2 MG PO TABS: 2.0000 mg | ORAL_TABLET | Freq: Four times a day (QID) | ORAL | Status: DC

## 2017-05-20 MED ORDER — ONDANSETRON HCL 4 MG PO TABS: 4.0000 mg | ORAL_TABLET | Freq: Four times a day (QID) | ORAL | Status: DC | PRN

## 2017-05-20 MED ORDER — MOMETASONE FURO-FORMOTEROL FUM 100-5 MCG/ACT IN AERO
2.0000 | INHALATION_SPRAY | Freq: Two times a day (BID) | RESPIRATORY_TRACT | Status: DC
  Administered 2017-05-20 – 2017-05-22 (×4): 2 via RESPIRATORY_TRACT
  Filled 2017-05-20: qty 8.8

## 2017-05-20 MED ORDER — TIZANIDINE HCL 2 MG PO TABS
4.0000 mg | ORAL_TABLET | Freq: Three times a day (TID) | ORAL | Status: DC
  Administered 2017-05-20 – 2017-05-22 (×6): 4 mg via ORAL
  Filled 2017-05-20 (×10): qty 1

## 2017-05-20 MED ORDER — ENOXAPARIN SODIUM 40 MG/0.4ML ~~LOC~~ SOLN
40.0000 mg | SUBCUTANEOUS | Status: DC
  Administered 2017-05-20 – 2017-05-21 (×2): 40 mg via SUBCUTANEOUS
  Filled 2017-05-20 (×2): qty 0.4

## 2017-05-20 MED ORDER — POTASSIUM CHLORIDE CRYS ER 20 MEQ PO TBCR
40.0000 meq | EXTENDED_RELEASE_TABLET | Freq: Once | ORAL | Status: AC
  Administered 2017-05-20: 40 meq via ORAL
  Filled 2017-05-20: qty 2

## 2017-05-20 MED ORDER — CLOPIDOGREL BISULFATE 75 MG PO TABS
75.0000 mg | ORAL_TABLET | Freq: Every day | ORAL | Status: DC
  Administered 2017-05-20 – 2017-05-22 (×3): 75 mg via ORAL
  Filled 2017-05-20 (×3): qty 1

## 2017-05-20 MED ORDER — ACETAMINOPHEN 325 MG PO TABS
650.0000 mg | ORAL_TABLET | Freq: Four times a day (QID) | ORAL | Status: DC | PRN
  Administered 2017-05-20 – 2017-05-22 (×2): 650 mg via ORAL
  Filled 2017-05-20 (×2): qty 2

## 2017-05-20 MED ORDER — ONDANSETRON HCL 4 MG/2ML IJ SOLN: 4.0000 mg | Freq: Four times a day (QID) | INTRAMUSCULAR | Status: DC | PRN

## 2017-05-20 MED ORDER — ASPIRIN 81 MG PO CHEW
81.0000 mg | CHEWABLE_TABLET | Freq: Every day | ORAL | Status: DC
  Administered 2017-05-20 – 2017-05-22 (×3): 81 mg via ORAL
  Filled 2017-05-20 (×3): qty 1

## 2017-05-20 MED ORDER — HYDROCODONE-ACETAMINOPHEN 5-325 MG PO TABS: 1.0000 | ORAL_TABLET | Freq: Every day | ORAL | Status: DC | PRN

## 2017-05-20 MED ORDER — LISINOPRIL 20 MG PO TABS
20.0000 mg | ORAL_TABLET | Freq: Every day | ORAL | Status: DC
Start: 2017-05-20 — End: 2017-05-22
  Administered 2017-05-20 – 2017-05-22 (×3): 20 mg via ORAL
  Filled 2017-05-20 (×3): qty 1

## 2017-05-20 MED ORDER — SERTRALINE HCL 50 MG PO TABS
50.0000 mg | ORAL_TABLET | Freq: Every day | ORAL | Status: DC
  Administered 2017-05-20 – 2017-05-22 (×3): 50 mg via ORAL
  Filled 2017-05-20 (×3): qty 1

## 2017-05-20 NOTE — Progress Notes (Signed)
Pharmacy Antibiotic Note  Chad Combs is a 61 y.o. male admitted on 05/19/2017 with UTI.  Pharmacy has been consulted for ceftriaxone dosing.  Plan: Ceftriaxone 2 grams q 24 hours ordered  Height: 5\' 9"  (175.3 cm) Weight: 181 lb 1.6 oz (82.1 kg) IBW/kg (Calculated) : 70.7  Temp (24hrs), Avg:98.8 F (37.1 C), Min:98.6 F (37 C), Max:99 F (37.2 C)  Recent Labs  Lab 05/19/17 1950 05/19/17 2021  WBC  --  20.1*  CREATININE 0.91  --     Estimated Creatinine Clearance: 85.2 mL/min (by C-G formula based on SCr of 0.91 mg/dL).    No Known Allergies  Antimicrobials this admission: Ceftriaxone 11/29  >>    >>   Dose adjustments this admission:   Microbiology results: 11/30 BCx: pending 11/29 UCx: pending 11/30 MRSA PCR: pending      11/29 UA: LE( tr)  NO2(-)  WBC TNTC  Thank you for allowing pharmacy to be a part of this patient's care.  Shameka Aggarwal S 05/20/2017 2:03 AM

## 2017-05-20 NOTE — H&P (Signed)
[facility] NAME: Chad [facility]    MR#:  [id number]  DATE OF BIRTH:  10-19-1955  DATE OF ADMISSION:  05/19/2017  PRIMARY CARE PHYSICIAN: [facility] [place]   REQUESTING/REFERRING PHYSICIAN: Quentin Cornwall, MD  CHIEF COMPLAINT:   Chief Complaint  Patient presents with  . Weakness    HISTORY OF PRESENT ILLNESS:  Chad Combs  is a 61 y.o. male who presents with an episode of seizure-like activity.  Patient states that he remembers the episode.  He states that he began having shaking of his extremities, and then had muscular spasm.  Family who witnessed the episode stated that it looked like a seizure, the patient's eyes rolled back in his head.  However, there was no postictal state.  Patient states that he remembers the entire episode, did not lose bowel or bladder continence, the episode lasted about 20 seconds, and he knew who he was and where he was and who is around him immediately after it stopped.  Here in the ED workup shows mostly only highly concentrated urine and possible UTI.  Hospitalist were called for further evaluation  PAST MEDICAL HISTORY:   Past Medical History:  Diagnosis Date  . Blind right eye   . COPD (chronic obstructive pulmonary disease) ([place])   . Diabetes mellitus without complication ([place])   . Hypertension     PAST SURGICAL HISTORY:   Past Surgical History:  Procedure Laterality Date  . IVC FILTER INSERTION N/A 08/24/2016   Procedure: IVC Filter Insertion;  Surgeon: Katha Cabal, MD;  Location: [facility];  Service: Cardiovascular;  Laterality: N/A;    SOCIAL HISTORY:   Social History   Tobacco Use  . Smoking status: Current Every Day Smoker    Packs/day: 1.50    Years: 45.00    Pack years: 67.50    Types: Cigarettes  . Smokeless tobacco: Never Used  Substance Use Topics  . Alcohol use: Yes    Alcohol/week: 0.0 oz    Comment: occasional (once every couple  months)    FAMILY HISTORY:   Family History  Problem Relation Age of Onset  . Cancer Mother   . Heart disease Father     DRUG ALLERGIES:  No Known Allergies  MEDICATIONS AT HOME:   Prior to Admission medications   Medication Sig Start Date End Date Taking? Authorizing Provider  acetaminophen (TYLENOL) 500 MG tablet Take 500 mg by mouth at bedtime as needed.    Yes [provider]  aspirin 81 MG chewable tablet Chew 81 mg by mouth daily.   Yes [provider]  bisacodyl (BISACODYL) 5 MG EC tablet Take 10 mg by mouth daily as needed for moderate constipation.   Yes [provider]  budesonide-formoterol (SYMBICORT) 80-4.5 MCG/ACT inhaler Inhale 2 puffs into the lungs every 12 (twelve) hours.   Yes [provider]  clopidogrel (PLAVIX) 75 MG tablet Take 75 mg by mouth daily.   Yes [provider]  diclofenac sodium (VOLTAREN) 1 % GEL Apply 2-4 g topically 2 (two) times daily. APPLY 2MG  TO LEFT SHOULDER TWICE DAILY AND 4 GRAMS TO LEFT HIP AT BEDTIME   Yes [provider]  ezetimibe (ZETIA) 10 MG tablet Take 1 tablet (10 mg total) by mouth daily at 6 PM. Patient taking differently: Take 10 mg by mouth daily.  10/01/16  Yes Love, Ivan Anchors, PA-C  famotidine (PEPCID) 40 MG tablet Take 1 tablet (40 mg  total) by mouth 2 (two) times daily. 10/01/16  Yes Love, Ivan Anchors, PA-C  fluticasone (FLONASE) 50 MCG/ACT nasal spray Place 1 spray into both nostrils daily. 08/25/16  Yes Max Sane, MD  HYDROcodone-acetaminophen (NORCO/VICODIN) 5-325 MG tablet Take 1 tablet by mouth daily as needed for moderate pain.   Yes [provider]  insulin aspart (NOVOLOG) 100 UNIT/ML injection Inject 0-9 Units into the skin 3 (three) times daily. SLIDING SCALE: 0-99 (0u), 100-149 (2u), 150-199 (3u), 200-249 (4u), 250-299 (5u), 300-349 (6u), 350-399 (7u), 400-449 (8u), 450-499 (9u) - IF 500+ CONTACT PHYSICIAN   Yes [provider]  insulin glargine  (LANTUS) 100 UNIT/ML injection Inject 45 Units into the skin at bedtime.   Yes [provider]  lisinopril (PRINIVIL,ZESTRIL) 20 MG tablet Take 20 mg by mouth daily.   Yes [provider]  Melatonin 5 MG TABS Take 1 tablet by mouth at bedtime as needed.   Yes [provider]  metFORMIN (GLUCOPHAGE) 1000 MG tablet Take 1,000 mg by mouth 2 (two) times daily with a meal.   Yes [provider]  metoprolol tartrate (LOPRESSOR) 25 MG tablet Take 0.5 tablets (12.5 mg total) by mouth 2 (two) times daily. 10/06/16  Yes Hendricks Limes, MD  polyethylene glycol [facility] Floria Raveling) packet Take 17 g by mouth daily. MIX 17 GRAMS IN 4 TO 8 OUNCES WATER AND GIVE EVERY MORNING   Yes [provider]  senna (SENOKOT) 8.6 MG TABS tablet Take 1 tablet by mouth 2 (two) times daily.   Yes [provider]  sertraline (ZOLOFT) 50 MG tablet Take 50 mg by mouth daily.   Yes [provider]  tamsulosin (FLOMAX) 0.4 MG CAPS capsule Take 1 capsule (0.4 mg total) by mouth daily after supper. 10/01/16  Yes Love, Ivan Anchors, PA-C  tiZANidine (ZANAFLEX) 2 MG tablet Take 1 tablet (2 mg total) by mouth at bedtime. Patient taking differently: Take 2-4 mg by mouth 4 (four) times daily. TAKE 4MG  BY MOUTH THREE TIMES DAILY AND 2MG  BY MOUTH AT BEDTIME 10/01/16  Yes Love, Ivan Anchors, PA-C  rivaroxaban (XARELTO) 20 MG TABS tablet Take 1 tablet (20 mg total) by mouth daily with supper. Start on 10/08/16 Patient not taking: Reported on 05/19/2017 10/08/16   Love, Ivan Anchors, PA-C  senna-docusate (SENOKOT-S) 8.6-50 MG tablet Take 1 tablet by mouth at bedtime. Patient not taking: Reported on 05/19/2017 10/01/16   Bary Leriche, PA-C    REVIEW OF SYSTEMS:  Review of Systems  Constitutional: Negative for chills, fever, malaise/fatigue and weight loss.  HENT: Negative for ear pain, hearing loss and tinnitus.   Eyes: Negative for blurred vision, double vision, pain and redness.   Respiratory: Negative for cough, hemoptysis and shortness of breath.   Cardiovascular: Negative for chest pain, palpitations, orthopnea and leg swelling.  Gastrointestinal: Negative for abdominal pain, constipation, diarrhea, nausea and vomiting.  Genitourinary: Negative for dysuria, frequency and hematuria.  Musculoskeletal: Negative for back pain, joint pain and neck pain.  Skin:       No acne, rash, or lesions  Neurological: Negative for dizziness, tremors, focal weakness and weakness.       Seizure-like activity, see HPI  Endo/Heme/Allergies: Negative for polydipsia. Does not bruise/bleed easily.  Psychiatric/Behavioral: Negative for depression. The patient is not nervous/anxious and does not have insomnia.      VITAL SIGNS:   Vitals:   05/19/17 2130 05/19/17 2200 05/19/17 2230 05/19/17 2300  BP: 117/63 130/63 (!) 125/58 140/69  Pulse:    79  Resp:      Temp:      TempSrc:      SpO2:    95%  Weight:      Height:       Wt Readings from Last 3 Encounters:  05/19/17 81.6 kg (180 lb)  11/08/16 84.4 kg (186 lb)  11/01/16 84.4 kg (186 lb)    PHYSICAL EXAMINATION:  Physical Exam  Vitals reviewed. Constitutional: He is oriented to person, place, and time. He appears well-developed and well-nourished. No distress.  HENT:  Head: Normocephalic and atraumatic.  Mouth/Throat: Oropharynx is clear and moist.  Eyes: Conjunctivae and EOM are normal. Pupils are equal, round, and reactive to light. No scleral icterus.  Neck: Normal range of motion. Neck supple. No JVD present. No thyromegaly present.  Cardiovascular: Normal rate, regular rhythm and intact distal pulses. Exam reveals no gallop and no friction rub.  No murmur heard. Respiratory: Effort normal and breath sounds normal. No respiratory distress. He has no wheezes. He has no rales.  GI: Soft. Bowel sounds are normal. He exhibits no distension. There is no tenderness.  Musculoskeletal: Normal range of motion. He exhibits no  edema.  No arthritis, no gout  Lymphadenopathy:    He has no cervical adenopathy.  Neurological: He is alert and oriented to person, place, and time. No cranial nerve deficit.  No dysarthria, no aphasia  Skin: Skin is warm and dry. No rash noted. No erythema.  Psychiatric: He has a normal mood and affect. His behavior is normal. Judgment and thought content normal.    LABORATORY PANEL:   CBC Recent Labs  Lab 05/19/17 2021  WBC 20.1*  HGB 15.4  HCT 45.6  PLT 162   ------------------------------------------------------------------------------------------------------------------  Chemistries  Recent Labs  Lab 05/19/17 1950  NA 136  K 3.7  CL 104  CO2 20*  GLUCOSE 191*  BUN 14  CREATININE 0.91  CALCIUM 9.2  AST 28  ALT 17  ALKPHOS 64  BILITOT 1.9*   ------------------------------------------------------------------------------------------------------------------  Cardiac Enzymes Recent Labs  Lab 05/19/17 1950  TROPONINI <0.03   ------------------------------------------------------------------------------------------------------------------  RADIOLOGY:  Ct Head Wo Contrast  Result Date: 05/19/2017 CLINICAL DATA:  New onset of seizure-like activity. Stroke in March with left-sided weakness. EXAM: CT HEAD WITHOUT CONTRAST TECHNIQUE: Contiguous axial images were obtained from the base of the skull through the vertex without intravenous contrast. COMPARISON:  09/15/2016 FINDINGS: Brain: Encephalomalacia involving the right MCA distribution, consistent with remote infarct. Ex vacuo dilatation of the right lateral ventricle. Mild cerebral and cerebellar atrophy for age. No mass lesion, hemorrhage, hydrocephalus, acute infarct, intra-axial, or extra-axial fluid collection. Vascular: Intracranial atherosclerosis. Skull: Normal Sinuses/Orbits: Normal imaged portions of the orbits and globes. Mucous retention cyst or polyp in the left ethmoid air cells. Clear mastoid air  cells. Other: None. IMPRESSION: 1.  No acute intracranial abnormality. 2. 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 Electronically Signed   By: Abigail Miyamoto M.D.   On: 05/19/2017 21:29   Dg Chest Portable 1 View  Result Date: 05/19/2017 CLINICAL DATA:  Seizure-like activity.  Concern for pneumonia. EXAM: PORTABLE CHEST 1 VIEW COMPARISON:  08/23/2016 FINDINGS: Numerous leads and wires project over the chest. Left cervical vascular stent. Normal heart size. 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 No pleural fluid. Clear lungs. IMPRESSION: No acute cardiopulmonary disease. 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 Electronically Signed   By: Abigail Miyamoto M.D.   On: 05/19/2017 20:38   Ct Angio Abd/pel W  And/or Wo Contrast  Result Date: 05/19/2017 CLINICAL DATA:  Abdominal aortic aneurysm, post repair. EXAM: CTA ABDOMEN AND PELVIS wITHOUT AND WITH CONTRAST TECHNIQUE: Multidetector CT imaging of the abdomen and pelvis was performed using the standard protocol during bolus administration of intravenous contrast. Multiplanar reconstructed images and MIPs were obtained and reviewed to evaluate the vascular anatomy. CONTRAST:  124mL ISOVUE-370 IOPAMIDOL (ISOVUE-370) INJECTION 76% COMPARISON:  CT angiography chest abdomen pelvis 09/09/2016 FINDINGS: VASCULAR Aorta: Fusiform aneurysm of the infrarenal aorta extends across the iliac bifurcation, maximal dimension 6.8 cm, unchanged. Large amount of mural thrombus within the aneurysm sac with calcifications, not significantly changed. Aneurysm arises approximately 6.7 cm distal to the left renal artery. No evidence of aneurysm repair or stent graft. Calcified noncalcified plaque of the suprarenal aorta, similar to prior exam. No periaortic stranding or retroperitoneal hemorrhage. Celiac: Proximal short-segment stenosis of the mid level secondary to median arcuate ligament of the diaphragm, unchanged. Patent branches distally. SMA: Patent  without evidence of aneurysm, dissection, vasculitis or significant stenosis. Renals: Both renal arteries are patent without evidence of aneurysm, dissection, vasculitis, fibromuscular dysplasia or significant stenosis. IMA: Short-segment origin stenosis, unchanged. Distal reconstitution via collaterals again seen. Inflow: Contiguous extension of aortic aneurysm into bilateral common iliac arteries. On the left this this involves a short segment maximal dimension 3.2 cm, unchanged. On the right this extends to the external iliac bifurcation, maximal dimension 4.1 cm, unchanged. At the level of the right internal and external bifurcation this 18 mm, unchanged. Calcified plaque throughout bilateral external iliac arteries with scattered areas of mild stenosis. Proximal Outflow: Bilateral common femoral and visualized portions of the superficial and profunda femoral arteries are patent without evidence of aneurysm, dissection, vasculitis or significant stenosis. Veins: Infrarenal IVC filter in place. Portal and mesenteric veins are patent. Aneurysm sac causes mass effect on the right external iliac vein with decreased intraluminal density which extends to the common femoral vein. A discrete filling defect is not seen. Review of the MIP images confirms the above findings. NON-VASCULAR Lower chest: Hypoventilatory changes at the lung bases, left greater than right. No consolidation. Emphysema suspected. No pleural fluid. Hepatobiliary: No focal hepatic lesion. Gallbladder physiologically distended, no calcified stone. No biliary dilatation. Pancreas: No ductal dilatation or inflammation. Spleen: Normal in size without focal abnormality. Adrenals/Urinary Tract: Normal adrenal glands. No hydronephrosis or perinephric edema. Subcentimeter hypodensity in the mid anterior left kidney is too small to characterize but likely cyst. Urinary bladder is not distended and not well evaluated. Stomach/Bowel: No bowel obstruction,  inflammation, or evidence of wall thickening. Stomach is nondistended. Normal appendix. Moderate colonic stool burden. Lymphatic: No enlarged abdominal or pelvic lymph nodes. Reproductive: Enlarged prostate gland spanning 5.5 cm causing mass effect on the bladder base. Other: No free air or ascites. Minimal fat on the left inguinal canal. Musculoskeletal: There are no acute or suspicious osseous abnormalities. Stable degenerative change at L5-S1. IMPRESSION: VASCULAR 1. 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 No evidence of rupture. This is unchanged from CTA 8 months prior. No evidence of aneurysm repair as stated in history, recommend correlation with surgical history. 2. IVC filter in place. Relatively decreased density within the right common iliac, external iliac and common femoral vein compared to the left without discrete filling defect, may be secondary to slow flow. Duplex ultrasound could provide are detailed evaluation of the lower extremity venous structures based  on clinical concern. NON-VASCULAR 1. No acute abnormality in the abdomen/pelvis. 2. Prostatic enlargement again seen. Electronically Signed   By: Jeb Levering M.D.   On: 05/19/2017 21:45    EKG:   Orders placed or performed during the hospital encounter of 05/19/17  . EKG 12-Lead  . EKG 12-Lead    IMPRESSION AND PLAN:  Principal Problem:   Seizure-like activity ([place]) -patient and family describe activity that likely appeared very much like a seizure.  However, given the patient's intact memory of the event lack of postictal state, it would be very atypical for seizure.  Unclear etiology for this episode.  We will get a neurology consult for further recommendations for evaluation Active Problems:   UTI (urinary tract infection) -antibiotics in place, culture sent   Benign essential HTN -continue  home meds   Chronic obstructive pulmonary disease ([place]) -home dose inhalers   Type II diabetes mellitus with neurological manifestations, uncontrolled ([place]) -sliding scale insulin with corresponding glucose checks   Hyperlipidemia -continue home medication  All the records are reviewed and case discussed with ED provider. Management plans discussed with the patient and/or family.  DVT PROPHYLAXIS: SubQ lovenox  GI PROPHYLAXIS: None  ADMISSION STATUS: Observation  CODE STATUS: Full Code Status History    Date Active Date Inactive Code Status Order ID Comments User Context   08/25/2016 14:46 10/01/2016 19:47 Full Code [id number]  Flora Lipps Inpatient   08/25/2016 14:46 08/25/2016 14:46 Full Code [id number]  Bary Leriche, PA-C Inpatient   08/22/2016 01:10 08/25/2016 14:39 Full Code [id number]  Hugelmeyer, Ubaldo Glassing, DO Inpatient      TOTAL TIME TAKING CARE OF THIS PATIENT: 40 minutes.   Jakayden Cancio [place] 05/20/2017, 12:13 AM  [employer]  [phone]  CC: Primary care physician; [place]  Note:  This document was prepared using Systems analyst and may include unintentional dictation errors.

## 2017-05-20 NOTE — Evaluation (Signed)
Physical Therapy Evaluation Patient Details Name: Chad Combs MRN: [id number] DOB: 1956/06/01 Today's Date: 05/20/2017   History of Present Illness  Chad Combs is a 61 y.o. male who presents with an episode of seizure-like activity.  Patient states that he remembers the episode.  He states that he began having shaking of his extremities, and then had muscular spasm.  Family who witnessed the episode stated that it looked like a seizure, the patient's eyes rolled back in his head.  However, there was no postictal state.  Patient states that he remembers the entire episode, did not lose bowel or bladder continence, the episode lasted about 20 seconds, and he knew who he was and where he was and who is around him immediately after it stopped.  Here in the ED workup shows mostly only highly concentrated urine and possible UTI.  Hospitalist were called for further evaluation  Clinical Impression  Pt requires modA+1 for bed mobility and transfers. He is unable to ambulate. At baseline he is able to perform stand pivot transfers to wheelchair with assist from staff. Pt utilizes wheelchair at his facility for all mobility. He is currently at his baseline function and can return to LTC with assist from staff. No PT needs. Order will be completed. Please enter new order if status or needs change.     Follow Up Recommendations No PT follow up    Equipment Recommendations    No equipment needed   Recommendations for Other Services       Precautions / Restrictions Precautions Precautions: Fall Restrictions Weight Bearing Restrictions: No      Mobility  Bed Mobility Overal bed mobility: Needs Assistance Bed Mobility: Supine to Sit     Supine to sit: Mod assist     General bed mobility comments: Heavy cues for sequencing with bed mobility. HOB elevated and use of bed rails  Transfers Overall transfer level: Needs assistance Equipment used: None Transfers: Sit to/from Stand Sit to Stand:  Mod assist         General transfer comment: Pt requires modA+1 to come to standing. Unable to accept any weight onto LLE. Pt performs stand pivot transfers at baseline  Ambulation/Gait             General Gait Details: Unable  Stairs            Wheelchair Mobility    Modified Rankin (Stroke Patients Only)       Balance Overall balance assessment: Needs assistance Sitting-balance support: No upper extremity supported Sitting balance-Leahy Scale: Fair     Standing balance support: Single extremity supported Standing balance-Leahy Scale: Poor                               Pertinent Vitals/Pain Pain Assessment: No/denies pain    Home Living Family/patient expects to be discharged to:: Other (Comment)([facility] LTC)                 Additional Comments: Pt is a LTC resident at [facility]    Prior Function Level of Independence: Needs assistance   Gait / Transfers Assistance Needed: Pt is transfers only with assist from staff and uses wheelchair for his mobility. L side hemiplegia  ADL's / Homemaking Assistance Needed: Assist from staff for all ADLs/IADLs        Hand Dominance   Dominant Hand: Left    Extremity/Trunk Assessment   Upper Extremity Assessment Upper  Extremity Assessment: LUE deficits/detail LUE Deficits / Details: LUE/LLE hemiplegia, RUE/RLE grossly WFL            Communication   Communication: No difficulties  Cognition Arousal/Alertness: Lethargic Behavior During Therapy: WFL for tasks assessed/performed Overall Cognitive Status: No family/caregiver present to determine baseline cognitive functioning                                 General Comments: Pt is AOx2, partially oriented to date, disoriented to situation. Covered in urine upon arrival      General Comments      Exercises     Assessment/Plan    PT Assessment Patent does not need any further PT services  PT Problem List          PT Treatment Interventions      PT Goals (Current goals can be found in the Care Plan section)  Acute Rehab PT Goals PT Goal Formulation: All assessment and education complete, DC therapy    Frequency     Barriers to discharge        Co-evaluation               AM-PAC PT "6 Clicks" Daily Activity  Outcome Measure Difficulty turning over in bed (including adjusting bedclothes, sheets and blankets)?: Unable Difficulty moving from lying on back to sitting on the side of the bed? : Unable Difficulty sitting down on and standing up from a chair with arms (e.g., wheelchair, bedside commode, etc,.)?: Unable Help needed moving to and from a bed to chair (including a wheelchair)?: A Lot Help needed walking in hospital room?: Total Help needed climbing 3-5 steps with a railing? : Total 6 Click Score: 7    End of Session Equipment Utilized During Treatment: Gait belt Activity Tolerance: Patient tolerated treatment well Patient left: in bed;with call bell/phone within reach;with bed alarm set Nurse Communication: Mobility status PT Visit Diagnosis: Muscle weakness (generalized) (M62.81)    Time: 8127-5170 PT Time Calculation (min) (ACUTE ONLY): 23 min   Charges:   PT Evaluation $PT Eval Low Complexity: 1 Low     PT G Codes:   PT G-Codes **NOT FOR INPATIENT CLASS** Functional Assessment Tool Used: AM-PAC 6 Clicks Basic Mobility Functional Limitation: Mobility: Walking and moving around Mobility: Walking and Moving Around Current Status (Y1749): At least 80 percent but less than 100 percent impaired, limited or restricted Mobility: Walking and Moving Around Goal Status [phone]): At least 80 percent but less than 100 percent impaired, limited or restricted Mobility: Walking and Moving Around Discharge Status [phone]): At least 80 percent but less than 100 percent impaired, limited or restricted    Chad Combs PT, DPT    Chad Combs 05/20/2017, 5:10 PM

## 2017-05-20 NOTE — Consult Note (Addendum)
Reason for Consult:Seizure-like activity Referring Physician: Tressia Miners  CC: Seizure-like activity  HPI: Chad Combs is an 61 y.o. male who presented on yesterday with an episode of seizure-like activity.  Patient states that he remembers the episode.  He states that he began having shaking of his extremities, and then stiffened.  Family who witnessed the episode stated that it looked like a seizure, the patient's eyes rolled back in his head.  However, there was no postictal state.  Patient states that he remembers the entire episode, did not lose bowel or bladder continence nor have tongue biting.  The episode lasted about 20 seconds, and he knew who he was and where he was and who is around him immediately after it stopped.   Patient reports no previous similar episodes.  Had no premonitory symptoms.   Past Medical History:  Diagnosis Date  . Blind right eye   . COPD (chronic obstructive pulmonary disease) ([place])   . Diabetes mellitus without complication ([place])   . Hypertension     Past Surgical History:  Procedure Laterality Date  . IVC FILTER INSERTION N/A 08/24/2016   Procedure: IVC Filter Insertion;  Surgeon: Katha Cabal, MD;  Location: [facility];  Service: Cardiovascular;  Laterality: N/A;    Family History  Problem Relation Age of Onset  . Cancer Mother   . Heart disease Father     Social History:  reports that he has been smoking cigarettes.  He has a 67.50 pack-year smoking history. he has never used smokeless tobacco. He reports that he drinks alcohol. He reports that he does not use drugs.  No Known Allergies  Medications:  I have reviewed the patient's current medications. Prior to Admission:  Medications Prior to Admission  Medication Sig Dispense Refill Last Dose  . acetaminophen (TYLENOL) 500 MG tablet Take 500 mg by mouth at bedtime as needed.    05/18/2017 at 2000  . aspirin 81 MG chewable tablet Chew 81 mg by mouth daily.   05/19/2017 at 0900   . bisacodyl (BISACODYL) 5 MG EC tablet Take 10 mg by mouth daily as needed for moderate constipation.   PRN at PRN  . budesonide-formoterol (SYMBICORT) 80-4.5 MCG/ACT inhaler Inhale 2 puffs into the lungs every 12 (twelve) hours.   05/19/2017 at 0900  . clopidogrel (PLAVIX) 75 MG tablet Take 75 mg by mouth daily.   05/19/2017 at 0900  . diclofenac sodium (VOLTAREN) 1 % GEL Apply 2-4 g topically 2 (two) times daily. APPLY 2MG  TO LEFT SHOULDER TWICE DAILY AND 4 GRAMS TO LEFT HIP AT BEDTIME   05/19/2017 at 1715  . ezetimibe (ZETIA) 10 MG tablet Take 1 tablet (10 mg total) by mouth daily at 6 PM. (Patient taking differently: Take 10 mg by mouth daily. )   05/19/2017 at 0900  . famotidine (PEPCID) 40 MG tablet Take 1 tablet (40 mg total) by mouth 2 (two) times daily.   05/19/2017 at 1715  . fluticasone (FLONASE) 50 MCG/ACT nasal spray Place 1 spray into both nostrils daily. 1 g 2 05/19/2017 at 0900  . HYDROcodone-acetaminophen (NORCO/VICODIN) 5-325 MG tablet Take 1 tablet by mouth daily as needed for moderate pain.   05/19/2017 at 0230  . insulin aspart (NOVOLOG) 100 UNIT/ML injection Inject 0-9 Units into the skin 3 (three) times daily. SLIDING SCALE: 0-99 (0u), 100-149 (2u), 150-199 (3u), 200-249 (4u), 250-299 (5u), 300-349 (6u), 272-536 (7u), 644-034 (8u), 742-595 (9u) - IF 500+ CONTACT PHYSICIAN   05/19/2017 at 1715  .  insulin glargine (LANTUS) 100 UNIT/ML injection Inject 45 Units into the skin at bedtime.   05/18/2017 at 2000  . lisinopril (PRINIVIL,ZESTRIL) 20 MG tablet Take 20 mg by mouth daily.   05/19/2017 at 0900  . Melatonin 5 MG TABS Take 1 tablet by mouth at bedtime as needed.   05/18/2017 at 2000  . metFORMIN (GLUCOPHAGE) 1000 MG tablet Take 1,000 mg by mouth 2 (two) times daily with a meal.   05/19/2017 at 1715  . metoprolol tartrate (LOPRESSOR) 25 MG tablet Take 0.5 tablets (12.5 mg total) by mouth 2 (two) times daily. 60 tablet 2 05/19/2017 at 1715  . polyethylene glycol (MIRALAX /  GLYCOLAX) packet Take 17 g by mouth daily. MIX 17 GRAMS IN 4 TO 8 OUNCES WATER AND GIVE EVERY MORNING   05/19/2017 at 0900  . senna (SENOKOT) 8.6 MG TABS tablet Take 1 tablet by mouth 2 (two) times daily.   05/19/2017 at 0900  . sertraline (ZOLOFT) 50 MG tablet Take 50 mg by mouth daily.   05/19/2017 at 0900  . tamsulosin (FLOMAX) 0.4 MG CAPS capsule Take 1 capsule (0.4 mg total) by mouth daily after supper. 30 capsule  05/19/2017 at 1715  . tiZANidine (ZANAFLEX) 2 MG tablet Take 1 tablet (2 mg total) by mouth at bedtime. (Patient taking differently: Take 2-4 mg by mouth 4 (four) times daily. TAKE 4MG  BY MOUTH THREE TIMES DAILY AND 2MG  BY MOUTH AT BEDTIME) 30 tablet 0 05/18/2017 at 1345  . rivaroxaban (XARELTO) 20 MG TABS tablet Take 1 tablet (20 mg total) by mouth daily with supper. Start on 10/08/16 (Patient not taking: Reported on 05/19/2017) 30 tablet  Not Taking at Unknown time  . senna-docusate (SENOKOT-S) 8.6-50 MG tablet Take 1 tablet by mouth at bedtime. (Patient not taking: Reported on 05/19/2017)   Not Taking at Unknown time   Scheduled: . aspirin  81 mg Oral Daily  . clopidogrel  75 mg Oral Daily  . enoxaparin (LOVENOX) injection  40 mg Subcutaneous Q24H  . famotidine  40 mg Oral BID  . insulin aspart  0-5 Units Subcutaneous QHS  . insulin aspart  0-9 Units Subcutaneous TID WC  . lisinopril  20 mg Oral Daily  . metoprolol tartrate  12.5 mg Oral BID  . mometasone-formoterol  2 puff Inhalation BID  . sertraline  50 mg Oral Daily  . tamsulosin  0.4 mg Oral QPC supper  . tiZANidine  2 mg Oral QHS  . tiZANidine  4 mg Oral TID    ROS: History obtained from the patient  General ROS: negative for - chills, fatigue, fever, night sweats, weight gain or weight loss Psychological ROS: negative for - behavioral disorder, hallucinations, memory difficulties, mood swings or suicidal ideation Ophthalmic ROS: right eye blind ENT ROS: negative for - epistaxis, nasal discharge, oral lesions,  sore throat, tinnitus or vertigo Allergy and Immunology ROS: negative for - hives or itchy/watery eyes Hematological and Lymphatic ROS: negative for - bleeding problems, bruising or swollen lymph nodes Endocrine ROS: negative for - galactorrhea, hair pattern changes, polydipsia/polyuria or temperature intolerance Respiratory ROS: negative for - cough, hemoptysis, shortness of breath or wheezing Cardiovascular ROS: negative for - chest pain, dyspnea on exertion, edema or irregular heartbeat Gastrointestinal ROS: negative for - abdominal pain, diarrhea, hematemesis, nausea/vomiting or stool incontinence Genito-Urinary ROS: negative for - dysuria, hematuria, incontinence or urinary frequency/urgency Musculoskeletal ROS: negative for - joint swelling or muscular weakness Neurological ROS: as noted in HPI Dermatological ROS: negative for rash and  skin lesion changes  Physical Examination: Blood pressure (!) 154/80, pulse 92, temperature 100.2 F (37.9 C), temperature source Oral, resp. rate 20, height 5\' 9"  (1.753 m), weight 82.1 kg (181 lb 1.6 oz), SpO2 94 %.  HEENT-  Normocephalic, no lesions, without obvious abnormality.  Normal external eye and conjunctiva.  Normal TM's bilaterally.  Normal auditory canals and external ears. Normal external nose, mucus membranes and septum.  Normal pharynx. Cardiovascular- S1, S2 normal, pulses palpable throughout   Lungs- chest clear, no wheezing, rales, normal symmetric air entry Abdomen- soft, non-tender; bowel sounds normal; no masses,  no organomegaly Extremities- no edema Lymph-no adenopathy palpable Musculoskeletal-no joint tenderness, deformity or swelling Skin-warm and dry, no hyperpigmentation, vitiligo, or suspicious lesions  Neurological Examination   Mental Status: Alert, oriented, thought content appropriate.  Speech fluent without evidence of aphasia.  Able to follow 3 step commands without difficulty. Cranial Nerves: II: Discs flat  bilaterally; Visual fields grossly normal in the left eye, right eye blind, left pupil reactive to light III,IV, VI: right ptosis, extra-ocular motions intact bilaterally V,VII: left facial droop, facial light touch sensation normal bilaterally VIII: hearing normal bilaterally IX,X: gag reflex present XI: bilateral shoulder shrug XII: midline tongue extension Motor: Right : Upper extremity   5/5    Left:     Upper extremity   0/5  Lower extremity   5/5     Lower extremity   0/5 Increased tone on the left Sensory: Pinprick and light touch intact throughout, bilaterally Deep Tendon Reflexes: 2+ on the right, 3-4+ on the left with sustained ankle clonus Plantars: Right: downgoing   Left: upgoing Cerebellar: Normal finger-to-nose and normal heel-to-shin testing using the right Gait: not tested due to safety concerns    Laboratory Studies:   Basic Metabolic Panel: Recent Labs  Lab 05/19/17 1950 05/20/17 0531  NA 136 135  K 3.7 3.2*  CL 104 104  CO2 20* 21*  GLUCOSE 191* 146*  BUN 14 12  CREATININE 0.91 0.98  CALCIUM 9.2 9.0    Liver Function Tests: Recent Labs  Lab 05/19/17 1950  AST 28  ALT 17  ALKPHOS 64  BILITOT 1.9*  PROT 7.1  ALBUMIN 3.8   No results for input(s): LIPASE, AMYLASE in the last 168 hours. No results for input(s): AMMONIA in the last 168 hours.  CBC: Recent Labs  Lab 05/19/17 2021 05/20/17 0531  WBC 20.1* 14.3*  NEUTROABS 18.3*  --   HGB 15.4 14.6  HCT 45.6 43.8  MCV 97.1 98.4  PLT 162 138*    Cardiac Enzymes: Recent Labs  Lab 05/19/17 1950  TROPONINI <0.03    BNP: Invalid input(s): POCBNP  CBG: Recent Labs  Lab 05/20/17 0756 05/20/17 1219  GLUCAP 138* 132*    Microbiology: Results for orders placed or performed during the hospital encounter of 05/19/17  Blood culture (routine x 2)     Status: None (Preliminary result)   Collection Time: 05/20/17 12:04 AM  Result Value Ref Range Status   Specimen Description BLOOD  RIGHT ARM  Final   Special Requests BOTTLES DRAWN AEROBIC AND ANAEROBIC BCLV  Final   Culture NO GROWTH < 12 HOURS  Final   Report Status PENDING  Incomplete  Blood culture (routine x 2)     Status: None (Preliminary result)   Collection Time: 05/20/17 12:04 AM  Result Value Ref Range Status   Specimen Description BLOOD RIGHT ARM  Final   Special Requests BOTTLES DRAWN AEROBIC AND ANAEROBIC BCHV  Final   Culture NO GROWTH < 12 HOURS  Final   Report Status PENDING  Incomplete  MRSA PCR Screening     Status: None   Collection Time: 05/20/17  1:54 AM  Result Value Ref Range Status   MRSA by PCR NEGATIVE NEGATIVE Final    Comment:        The GeneXpert MRSA Assay (FDA approved for NASAL specimens only), is one component of a comprehensive MRSA colonization surveillance program. It is not intended to diagnose MRSA infection nor to guide or monitor treatment for MRSA infections.     Coagulation Studies: No results for input(s): LABPROT, INR in the last 72 hours.  Urinalysis:  Recent Labs  Lab 05/19/17 2021  COLORURINE YELLOW*  LABSPEC >1.046*  PHURINE 5.0  GLUCOSEU NEGATIVE  HGBUR SMALL*  BILIRUBINUR NEGATIVE  KETONESUR NEGATIVE  PROTEINUR 30*  NITRITE NEGATIVE  LEUKOCYTESUR TRACE*    Lipid Panel:     Component Value Date/Time   CHOL 193 08/22/2016 0747   TRIG 441 (H) 08/22/2016 0747   HDL 35 (L) 08/22/2016 0747   CHOLHDL 5.5 08/22/2016 0747   VLDL UNABLE TO CALCULATE IF TRIGLYCERIDE OVER 400 mg/dL 08/22/2016 0747   LDLCALC UNABLE TO CALCULATE IF TRIGLYCERIDE OVER 400 mg/dL 08/22/2016 0747    HgbA1C:  Lab Results  Component Value Date   HGBA1C 10.5 (H) 08/22/2016    Urine Drug Screen:      Component Value Date/Time   LABOPIA NONE DETECTED 08/22/2016 0359   COCAINSCRNUR NONE DETECTED 08/22/2016 0359   LABBENZ NONE DETECTED 08/22/2016 0359   AMPHETMU NONE DETECTED 08/22/2016 0359   THCU NONE DETECTED 08/22/2016 0359   LABBARB NONE DETECTED 08/22/2016  0359    Alcohol Level: No results for input(s): ETH in the last 168 hours.  Other results: EKG: sinus rhythm at 92 bpm.  Imaging: Ct Head Wo Contrast  Result Date: 05/19/2017 CLINICAL DATA:  New onset of seizure-like activity. Stroke in March with left-sided weakness. EXAM: CT HEAD WITHOUT CONTRAST TECHNIQUE: Contiguous axial images were obtained from the base of the skull through the vertex without intravenous contrast. COMPARISON:  09/15/2016 FINDINGS: Brain: Encephalomalacia involving the right MCA distribution, consistent with remote infarct. Ex vacuo dilatation of the right lateral ventricle. Mild cerebral and cerebellar atrophy for age. No mass lesion, hemorrhage, hydrocephalus, acute infarct, intra-axial, or extra-axial fluid collection. Vascular: Intracranial atherosclerosis. Skull: Normal Sinuses/Orbits: Normal imaged portions of the orbits and globes. Mucous retention cyst or polyp in the left ethmoid air cells. Clear mastoid air cells. Other: None. IMPRESSION: 1.  No acute intracranial abnormality. 2. 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 Electronically Signed   By: Abigail Miyamoto M.D.   On: 05/19/2017 21:29   Dg Chest Portable 1 View  Result Date: 05/19/2017 CLINICAL DATA:  Seizure-like activity.  Concern for pneumonia. EXAM: PORTABLE CHEST 1 VIEW COMPARISON:  08/23/2016 FINDINGS: Numerous leads and wires project over the chest. Left cervical vascular stent. Normal heart size. 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 No pleural fluid. Clear lungs. IMPRESSION: No acute cardiopulmonary disease. 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 Electronically Signed   By: Abigail Miyamoto M.D.   On: 05/19/2017 20:38   Ct Angio Abd/pel W And/or Wo Contrast  Result Date: 05/19/2017 CLINICAL DATA:  Abdominal aortic aneurysm, post repair. EXAM: CTA ABDOMEN AND PELVIS wITHOUT AND WITH CONTRAST TECHNIQUE: Multidetector CT imaging of the abdomen and pelvis was performed  using the standard protocol during bolus administration of intravenous contrast. Multiplanar reconstructed images and  MIPs were obtained and reviewed to evaluate the vascular anatomy. CONTRAST:  128mL ISOVUE-370 IOPAMIDOL (ISOVUE-370) INJECTION 76% COMPARISON:  CT angiography chest abdomen pelvis 09/09/2016 FINDINGS: VASCULAR Aorta: Fusiform aneurysm of the infrarenal aorta extends across the iliac bifurcation, maximal dimension 6.8 cm, unchanged. Large amount of mural thrombus within the aneurysm sac with calcifications, not significantly changed. Aneurysm arises approximately 6.7 cm distal to the left renal artery. No evidence of aneurysm repair or stent graft. Calcified noncalcified plaque of the suprarenal aorta, similar to prior exam. No periaortic stranding or retroperitoneal hemorrhage. Celiac: Proximal short-segment stenosis of the mid level secondary to median arcuate ligament of the diaphragm, unchanged. Patent branches distally. SMA: Patent without evidence of aneurysm, dissection, vasculitis or significant stenosis. Renals: Both renal arteries are patent without evidence of aneurysm, dissection, vasculitis, fibromuscular dysplasia or significant stenosis. IMA: Short-segment origin stenosis, unchanged. Distal reconstitution via collaterals again seen. Inflow: Contiguous extension of aortic aneurysm into bilateral common iliac arteries. On the left this this involves a short segment maximal dimension 3.2 cm, unchanged. On the right this extends to the external iliac bifurcation, maximal dimension 4.1 cm, unchanged. At the level of the right internal and external bifurcation this 18 mm, unchanged. Calcified plaque throughout bilateral external iliac arteries with scattered areas of mild stenosis. Proximal Outflow: Bilateral common femoral and visualized portions of the superficial and profunda femoral arteries are patent without evidence of aneurysm, dissection, vasculitis or significant stenosis. Veins:  Infrarenal IVC filter in place. Portal and mesenteric veins are patent. Aneurysm sac causes mass effect on the right external iliac vein with decreased intraluminal density which extends to the common femoral vein. A discrete filling defect is not seen. Review of the MIP images confirms the above findings. NON-VASCULAR Lower chest: Hypoventilatory changes at the lung bases, left greater than right. No consolidation. Emphysema suspected. No pleural fluid. Hepatobiliary: No focal hepatic lesion. Gallbladder physiologically distended, no calcified stone. No biliary dilatation. Pancreas: No ductal dilatation or inflammation. Spleen: Normal in size without focal abnormality. Adrenals/Urinary Tract: Normal adrenal glands. No hydronephrosis or perinephric edema. Subcentimeter hypodensity in the mid anterior left kidney is too small to characterize but likely cyst. Urinary bladder is not distended and not well evaluated. Stomach/Bowel: No bowel obstruction, inflammation, or evidence of wall thickening. Stomach is nondistended. Normal appendix. Moderate colonic stool burden. Lymphatic: No enlarged abdominal or pelvic lymph nodes. Reproductive: Enlarged prostate gland spanning 5.5 cm causing mass effect on the bladder base. Other: No free air or ascites. Minimal fat on the left inguinal canal. Musculoskeletal: There are no acute or suspicious osseous abnormalities. Stable degenerative change at L5-S1. IMPRESSION: VASCULAR 1. 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 No evidence of rupture. This is unchanged from CTA 8 months prior. No evidence of aneurysm repair as stated in history, recommend correlation with surgical history. 2. IVC filter in place. Relatively decreased density within the right common iliac, external iliac and common femoral vein compared to the left without discrete filling  defect, may be secondary to slow flow. Duplex ultrasound could provide are detailed evaluation of the lower extremity venous structures based on clinical concern. NON-VASCULAR 1. No acute abnormality in the abdomen/pelvis. 2. Prostatic enlargement again seen. Electronically Signed   By: Jeb Levering M.D.   On: 05/19/2017 21:45     Assessment/Plan: 61 year old male presenting with a shaking spell.  Per history does not  appear to be epileptic in nature with patient being fully aware of the event despite the fact that the event is bilateral.  EEG reviewed and shows no epileptiform activity.  Patient with multiple embolic events in the past.  On ASA and Plavix currently.  Head CT reviewed and shows no acute changes.  Would rule out further embolic events.    Recommendations: 1. MRI of the brain without contrast 2. Continue ASA and Plavix 3. No anticonvulsant therapy indicated at this time  Alexis Goodell, MD Neurology [phone] 05/20/2017, 1:53 PM   Addendum: MRI unable to be performed at this facility.  May possibly be scheduled on an outpatient basis at a different facility.  Alexis Goodell, MD Neurology [phone]

## 2017-05-20 NOTE — NC FL2 (Signed)
[place] LEVEL OF CARE SCREENING TOOL     IDENTIFICATION  Patient Name: Chad Combs Birthdate: 02/08/1956 Sex: male Admission Date (Current Location): 05/19/2017  [place] and [place] Number:  Chad Combs [id number] [place] and Address:  [facility], [address], [place], [place] [address]      Provider Number: [id number]  Attending Physician Name and Address:  Gladstone Lighter, MD  Relative Name and Phone Number:  Chad Combs Son [phone] or Chad Combs   [phone]     Current Level of Care: Hospital Recommended Level of Care: [place] Prior Approval Number:    Date Approved/Denied:   PASRR Number: [id number]  Discharge Plan: SNF    Current Diagnoses: Patient Active Problem List   Diagnosis Date Noted  . UTI (urinary tract infection) 05/19/2017  . Seizure-like activity ([place]) 05/19/2017  . Hyperlipidemia 11/08/2016  . Type II diabetes mellitus with neurological manifestations, uncontrolled ([place]) 10/04/2016  . Dyslipidemia associated with type 2 diabetes mellitus ([place]) 10/04/2016  . AAA (abdominal aortic aneurysm) without rupture ([place])   . Acute deep vein thrombosis (DVT) of femoral vein of left lower extremity ([place])   . Pulmonary embolus ([place])   . Adjustment disorder   . Acute right MCA stroke ([place]) 08/25/2016  . Left hemiplegia ([place])   . Dysarthria, post-stroke   . Dysphagia, post-stroke   . Carotid stenosis   . Benign essential HTN   . Leukocytosis   . Polycythemia vera ([place])   . Tobacco abuse   . Chronic obstructive pulmonary disease ([place])   . 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    Orientation RESPIRATION BLADDER Height & Weight     Self  Normal Incontinent Weight: 181 lb 1.6 oz (82.1 kg) Height:  5\' 9"  (175.3 cm)  BEHAVIORAL SYMPTOMS/MOOD NEUROLOGICAL BOWEL NUTRITION STATUS      Continent Diet(Regular diet)  AMBULATORY STATUS COMMUNICATION OF NEEDS Skin   Limited Assist Verbally  Normal                       Personal Care Assistance Level of Assistance  Bathing, Feeding Bathing Assistance: Limited assistance Feeding assistance: Limited assistance       Functional Limitations Info  Sight, Hearing, Speech Sight Info: Adequate Hearing Info: Adequate Speech Info: Adequate    SPECIAL CARE FACTORS FREQUENCY                       Contractures Contractures Info: Not present    Additional Factors Info  Code Status, Allergies, Insulin Sliding Scale, Psychotropic Code Status Info: Full Code Allergies Info: NKA Psychotropic Info: sertraline (ZOLOFT) tablet 50 mg  Insulin Sliding Scale Info: insulin aspart (novoLOG) injection 0-9 Units        Current Medications (05/20/2017):  This is the current hospital active medication list Current Facility-Administered Medications  Medication Dose Route Frequency Provider Last Rate Last Dose  . acetaminophen (TYLENOL) tablet 650 mg  650 mg Oral Q6H PRN Lance Coon, MD   650 mg at 05/20/17 1300   Or  . acetaminophen (TYLENOL) suppository 650 mg  650 mg Rectal Q6H PRN Lance Coon, MD      . aspirin chewable tablet 81 mg  81 mg Oral Daily Lance Coon, MD   81 mg at 05/20/17 1258  . cefTRIAXone (ROCEPHIN) 2 g in dextrose 5 % 50 mL IVPB  2 g Intravenous Q24H Lance Coon, MD      . clopidogrel (PLAVIX) tablet 75 mg  75 mg Oral Daily Lance Coon, MD   75 mg at 05/20/17 1259  . enoxaparin (LOVENOX) injection 40 mg  40 mg Subcutaneous Q24H Lance Coon, MD      . famotidine (PEPCID) tablet 40 mg  40 mg Oral BID Lance Coon, MD   40 mg at 05/20/17 1258  . HYDROcodone-acetaminophen (NORCO/VICODIN) 5-325 MG per tablet 1 tablet  1 tablet Oral Daily PRN Lance Coon, MD      . insulin aspart (novoLOG) injection 0-5 Units  0-5 Units Subcutaneous QHS Lance Coon, MD      . insulin aspart (novoLOG) injection 0-9 Units  0-9 Units Subcutaneous TID WC Lance Coon, MD   1 Units at 05/20/17 1300  . lisinopril  (PRINIVIL,ZESTRIL) tablet 20 mg  20 mg Oral Daily Lance Coon, MD   20 mg at 05/20/17 1258  . metoprolol tartrate (LOPRESSOR) tablet 12.5 mg  12.5 mg Oral BID Lance Coon, MD   12.5 mg at 05/20/17 1259  . mometasone-formoterol (DULERA) 100-5 MCG/ACT inhaler 2 puff  2 puff Inhalation BID Lance Coon, MD      . ondansetron [facility]) tablet 4 mg  4 mg Oral Q6H PRN Lance Coon, MD       Or  . ondansetron [facility]) injection 4 mg  4 mg Intravenous Q6H PRN Lance Coon, MD      . sertraline (ZOLOFT) tablet 50 mg  50 mg Oral Daily Lance Coon, MD   50 mg at 05/20/17 1259  . tamsulosin (FLOMAX) capsule 0.4 mg  0.4 mg Oral QPC supper Lance Coon, MD      . tiZANidine (ZANAFLEX) tablet 2 mg  2 mg Oral QHS Lance Coon, MD      . tiZANidine (ZANAFLEX) tablet 4 mg  4 mg Oral TID Lance Coon, MD   4 mg at 05/20/17 1300     Discharge Medications: Please see discharge summary for a list of discharge medications.  Relevant Imaging Results:  Relevant Lab Results:   Additional Information SSN [id number]  Ross Ludwig, [place]

## 2017-05-20 NOTE — Progress Notes (Signed)
[facility] [place] NAME: Chad Combs    MR#:  [id number]  DATE OF BIRTH:  February 20, 1956  SUBJECTIVE:  CHIEF COMPLAINT:   Chief Complaint  Patient presents with  . Weakness   - admitted with rigor vs seizure episode - feels better now - low grade fever today  REVIEW OF SYSTEMS:  Review of Systems  Constitutional: Positive for chills, fever and malaise/fatigue.  HENT: Negative for congestion, ear discharge, hearing loss and nosebleeds.   Eyes: Positive for blurred vision. Negative for photophobia and pain.  Respiratory: Negative for cough, shortness of breath and wheezing.   Cardiovascular: Negative for chest pain, palpitations and leg swelling.  Gastrointestinal: Negative for abdominal pain, constipation, diarrhea, nausea and vomiting.  Genitourinary: Negative for dysuria.  Musculoskeletal: Positive for myalgias.  Neurological: Positive for focal weakness and seizures. Negative for dizziness and headaches.    DRUG ALLERGIES:  No Known Allergies  VITALS:  Blood pressure (!) 154/80, pulse 92, temperature 100.2 F (37.9 C), temperature source Oral, resp. rate 20, height 5\' 9"  (1.753 m), weight 82.1 kg (181 lb 1.6 oz), SpO2 94 %.  PHYSICAL EXAMINATION:  Physical Exam  GENERAL:  61 y.o.-year-old patient lying in the bed with no acute distress.  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 Left pupil is reacting to light and accommodation.. No scleral icterus. Extraocular muscles intact.  HEENT: Head atraumatic, normocephalic. Oropharynx and nasopharynx clear. Left facial droop noted NECK:  Supple, no jugular venous distention. No thyroid enlargement, no tenderness.  LUNGS: Normal breath sounds bilaterally, no wheezing, rales,rhonchi or crepitation. No use of accessory muscles of respiration. Decreased bibasilar breath sounds CARDIOVASCULAR: S1, S2 normal. No murmurs, rubs, or  gallops.  ABDOMEN: Soft, nontender, nondistended. Bowel sounds present. No organomegaly or mass.  EXTREMITIES: No pedal edema, cyanosis, or clubbing.  NEUROLOGIC: Cranial nerves II through XII are intact. 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 Left facial droop noted. Strength on the left side is 0 x 5 in both upper and lower extremities due to previous stroke. Right-sided strength is 5/5. Sensation intact. Gait not checked.  PSYCHIATRIC: The patient is alert and oriented x 3.  SKIN: No obvious rash, lesion, or ulcer.    LABORATORY PANEL:   CBC Recent Labs  Lab 05/20/17 0531  WBC 14.3*  HGB 14.6  HCT 43.8  PLT 138*   ------------------------------------------------------------------------------------------------------------------  Chemistries  Recent Labs  Lab 05/19/17 1950 05/20/17 0531  NA 136 135  K 3.7 3.2*  CL 104 104  CO2 20* 21*  GLUCOSE 191* 146*  BUN 14 12  CREATININE 0.91 0.98  CALCIUM 9.2 9.0  AST 28  --   ALT 17  --   ALKPHOS 64  --   BILITOT 1.9*  --    ------------------------------------------------------------------------------------------------------------------  Cardiac Enzymes Recent Labs  Lab 05/19/17 1950  TROPONINI <0.03   ------------------------------------------------------------------------------------------------------------------  RADIOLOGY:  Ct Head Wo Contrast  Result Date: 05/19/2017 CLINICAL DATA:  New onset of seizure-like activity. Stroke in March with left-sided weakness. EXAM: CT HEAD WITHOUT CONTRAST TECHNIQUE: Contiguous axial images were obtained from the base of the skull through the vertex without intravenous contrast. COMPARISON:  09/15/2016 FINDINGS: Brain: Encephalomalacia involving the right MCA distribution, consistent with remote infarct. Ex vacuo dilatation of the right lateral ventricle. Mild cerebral and cerebellar atrophy for age. No mass lesion, hemorrhage, hydrocephalus, acute infarct, intra-axial, or  extra-axial fluid collection. Vascular: Intracranial atherosclerosis. Skull: Normal Sinuses/Orbits: Normal imaged portions of the orbits and globes. Mucous retention cyst or polyp in the left ethmoid air cells. Clear mastoid air cells. Other: None. IMPRESSION: 1.  No acute intracranial abnormality. 2. 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 Electronically Signed   By: Abigail Miyamoto M.D.   On: 05/19/2017 21:29   Dg Chest Portable 1 View  Result Date: 05/19/2017 CLINICAL DATA:  Seizure-like activity.  Concern for pneumonia. EXAM: PORTABLE CHEST 1 VIEW COMPARISON:  08/23/2016 FINDINGS: Numerous leads and wires project over the chest. Left cervical vascular stent. Normal heart size. 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 No pleural fluid. Clear lungs. IMPRESSION: No acute cardiopulmonary disease. 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 Electronically Signed   By: Abigail Miyamoto M.D.   On: 05/19/2017 20:38   Ct Angio Abd/pel W And/or Wo Contrast  Result Date: 05/19/2017 CLINICAL DATA:  Abdominal aortic aneurysm, post repair. EXAM: CTA ABDOMEN AND PELVIS wITHOUT AND WITH CONTRAST TECHNIQUE: Multidetector CT imaging of the abdomen and pelvis was performed using the standard protocol during bolus administration of intravenous contrast. Multiplanar reconstructed images and MIPs were obtained and reviewed to evaluate the vascular anatomy. CONTRAST:  160mL ISOVUE-370 IOPAMIDOL (ISOVUE-370) INJECTION 76% COMPARISON:  CT angiography chest abdomen pelvis 09/09/2016 FINDINGS: VASCULAR Aorta: Fusiform aneurysm of the infrarenal aorta extends across the iliac bifurcation, maximal dimension 6.8 cm, unchanged. Large amount of mural thrombus within the aneurysm sac with calcifications, not significantly changed. Aneurysm arises approximately 6.7 cm distal to the left renal artery. No evidence of aneurysm repair or stent graft. Calcified noncalcified plaque of the suprarenal aorta, similar to  prior exam. No periaortic stranding or retroperitoneal hemorrhage. Celiac: Proximal short-segment stenosis of the mid level secondary to median arcuate ligament of the diaphragm, unchanged. Patent branches distally. SMA: Patent without evidence of aneurysm, dissection, vasculitis or significant stenosis. Renals: Both renal arteries are patent without evidence of aneurysm, dissection, vasculitis, fibromuscular dysplasia or significant stenosis. IMA: Short-segment origin stenosis, unchanged. Distal reconstitution via collaterals again seen. Inflow: Contiguous extension of aortic aneurysm into bilateral common iliac arteries. On the left this this involves a short segment maximal dimension 3.2 cm, unchanged. On the right this extends to the external iliac bifurcation, maximal dimension 4.1 cm, unchanged. At the level of the right internal and external bifurcation this 18 mm, unchanged. Calcified plaque throughout bilateral external iliac arteries with scattered areas of mild stenosis. Proximal Outflow: Bilateral common femoral and visualized portions of the superficial and profunda femoral arteries are patent without evidence of aneurysm, dissection, vasculitis or significant stenosis. Veins: Infrarenal IVC filter in place. Portal and mesenteric veins are patent. Aneurysm sac causes mass effect on the right external iliac vein with decreased intraluminal density which extends to the common femoral vein. A discrete filling defect is not seen. Review of the MIP images confirms the above findings. NON-VASCULAR Lower chest: Hypoventilatory changes at the lung bases, left greater than right. No consolidation. Emphysema suspected. No pleural fluid. Hepatobiliary: No focal hepatic lesion. Gallbladder physiologically distended, no calcified stone. No biliary dilatation. Pancreas: No ductal dilatation or inflammation. Spleen: Normal in size without focal abnormality. Adrenals/Urinary Tract: Normal adrenal glands. No  hydronephrosis or perinephric edema. Subcentimeter hypodensity in the mid anterior left kidney is too small to characterize but likely cyst. Urinary bladder is not distended and not well evaluated. Stomach/Bowel: No bowel obstruction, inflammation, or evidence of wall thickening. Stomach is nondistended. Normal appendix. Moderate colonic stool burden. Lymphatic: No enlarged  abdominal or pelvic lymph nodes. Reproductive: Enlarged prostate gland spanning 5.5 cm causing mass effect on the bladder base. Other: No free air or ascites. Minimal fat on the left inguinal canal. Musculoskeletal: There are no acute or suspicious osseous abnormalities. Stable degenerative change at L5-S1. IMPRESSION: VASCULAR 1. 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 No evidence of rupture. This is unchanged from CTA 8 months prior. No evidence of aneurysm repair as stated in history, recommend correlation with surgical history. 2. IVC filter in place. Relatively decreased density within the right common iliac, external iliac and common femoral vein compared to the left without discrete filling defect, may be secondary to slow flow. Duplex ultrasound could provide are detailed evaluation of the lower extremity venous structures based on clinical concern. NON-VASCULAR 1. No acute abnormality in the abdomen/pelvis. 2. Prostatic enlargement again seen. Electronically Signed   By: Jeb Levering M.D.   On: 05/19/2017 21:45    EKG:   Orders placed or performed during the hospital encounter of 05/19/17  . EKG 12-Lead  . EKG 12-Lead    ASSESSMENT AND PLAN:   61 year old male with past medical history significant for CVA with left-sided hemiparesis, COPD, diabetes and hypertension results to hospital from nursing home secondary to seizure-like episode  #1 seizure-like episode-no previous history of seizure  but had stroke with left hemiparesis. -Agree with neurology consult. EEG ordered. Also recommend an MRI if needed -Also cannot rule out right worse as patient was awake to the episode especially given his UTI and Ongoing fevers. -Continue seizure precautions. -Hold off on antiepileptics until seen by neurology.  #2 h/o of CVA-currently living at [facility] and wheelchair-bound. Has complete left-sided hemiparesis- -Continue aspirin and Plavix and add statin  #3 hypertension-on metoprolol and lisinopril  #4 hypokalemia being replaced  #5 acute cystitis-follow up urine and blood cultures. Agree with Rocephin at this time  #6 DVT prophylaxis-on Lovenox  Physical therapy consult prior to discharge   All the records are reviewed and case discussed with Care Management/Social Workerr. Management plans discussed with the patient, family and they are in agreement.  CODE STATUS: Full Code  TOTAL TIME TAKING CARE OF THIS PATIENT: 38 minutes.   POSSIBLE D/C IN 2-3 DAYS, DEPENDING ON CLINICAL CONDITION.   Jaquay Morneault M.D on 05/20/2017 at 12:05 PM  Between 7am to 6pm - Pager - [phone]  After 6pm go to [email or web address] - password [email or web address] [facility]  Office  [phone]  CC: Primary care physician; Center, [facility]

## 2017-05-20 NOTE — Care Management Note (Signed)
Case Management Note  Patient Details  Name: ELVAN EBRON MRN: [id number] Date of Birth: 1956-03-17  Subjective/Objective:                 CM consult for long term care at [facility].   Action/Plan:  Notified CSW   Expected Discharge Date:                  Expected Discharge Plan:     In-House Referral:     Discharge planning Services     Post Acute Care Choice:    Choice offered to:     DME Arranged:    DME Agency:     HH Arranged:    HH Agency:     Status of Service:     If discussed at [employer], dates discussed:    Additional Comments:  Katrina Stack, RN 05/20/2017, 3:42 PM

## 2017-05-20 NOTE — Progress Notes (Signed)
Patient arrived to 2A Room 252. Patient denies pain and all questions answered. Patient oriented to unit and use of call bell. Skin assessment completed with Wendelyn Breslow RN and skin intact with scabbed abrasions noted to posterior bilateral heels. A&Ox4, VSS, and NSR/ST on verified tele-box #[id number]. Seizure pads on siderails x2. Nursing staff will continue to monitor for any changes in patient status. Earleen Reaper, RN

## 2017-05-21 LAB — BASIC METABOLIC PANEL WITH GFR
Anion gap: 11 (ref 5–15)
BUN: 12 mg/dL (ref 6–20)
CO2: 22 mmol/L (ref 22–32)
Calcium: 8.8 mg/dL — ABNORMAL LOW (ref 8.9–10.3)
Chloride: 104 mmol/L (ref 101–111)
Creatinine, Ser: 0.79 mg/dL (ref 0.61–1.24)
GFR calc Af Amer: >60 mL/min
GFR calc non Af Amer: >60 mL/min
Glucose, Bld: 141 mg/dL — ABNORMAL HIGH (ref 65–99)
Potassium: 3.3 mmol/L — ABNORMAL LOW (ref 3.5–5.1)
Sodium: 137 mmol/L (ref 135–145)

## 2017-05-21 LAB — GLUCOSE, CAPILLARY
GLUCOSE-CAPILLARY: 114 mg/dL — AB (ref 65–99)
GLUCOSE-CAPILLARY: 191 mg/dL — AB (ref 65–99)
GLUCOSE-CAPILLARY: 170 mg/dL — AB (ref 65–99)
Glucose-Capillary: 177 mg/dL — ABNORMAL HIGH (ref 65–99)

## 2017-05-21 MED ORDER — SODIUM CHLORIDE 0.9% FLUSH
3.0000 mL | Freq: Two times a day (BID) | INTRAVENOUS | Status: DC
  Administered 2017-05-21 – 2017-05-22 (×3): 3 mL via INTRAVENOUS

## 2017-05-21 MED ORDER — ATORVASTATIN CALCIUM 20 MG PO TABS
40.0000 mg | ORAL_TABLET | Freq: Every day | ORAL | Status: DC
  Administered 2017-05-21: 40 mg via ORAL
  Filled 2017-05-21: qty 2

## 2017-05-21 MED ORDER — POTASSIUM CHLORIDE CRYS ER 20 MEQ PO TBCR
40.0000 meq | EXTENDED_RELEASE_TABLET | ORAL | Status: AC
  Administered 2017-05-21 (×2): 40 meq via ORAL
  Filled 2017-05-21 (×2): qty 2

## 2017-05-21 NOTE — Progress Notes (Signed)
Notified Dr. Anselm Jungling of patient's blood pressure of 65/43. Patient pale and lethargic. Bolus ordered and given. BP came up and patient;s lethargy has improved.

## 2017-05-21 NOTE — Progress Notes (Signed)
[facility] at [place] NAME: Chad Combs    MR#:  [id number]  DATE OF BIRTH:  [birth year]  SUBJECTIVE:  CHIEF COMPLAINT:   Chief Complaint  Patient presents with  . Weakness   - No further seizures/right more severe. -Urine cultures growing gram-negative rods. Blood pressure dropped last night, however elevated again this morning. Patient states that he is feeling much better  REVIEW OF SYSTEMS:  Review of Systems  Constitutional: Positive for malaise/fatigue. Negative for chills and fever.  HENT: Negative for congestion, ear discharge, hearing loss and nosebleeds.   Eyes: Positive for blurred vision. Negative for photophobia and pain.  Respiratory: Negative for cough, shortness of breath and wheezing.   Cardiovascular: Negative for chest pain, palpitations and leg swelling.  Gastrointestinal: Negative for abdominal pain, constipation, diarrhea, nausea and vomiting.  Genitourinary: Negative for dysuria.  Musculoskeletal: Positive for myalgias.  Neurological: Positive for focal weakness. Negative for dizziness, seizures and headaches.    DRUG ALLERGIES:  No Known Allergies  VITALS:  Blood pressure (!) 167/84, pulse 75, temperature 97.8 F (36.6 C), temperature source Oral, resp. rate 15, height 5\' 9"  (1.753 m), weight 82.1 kg (181 lb 1.6 oz), SpO2 95 %.  PHYSICAL EXAMINATION:  Physical Exam  GENERAL:  61 y.o.-year-old patient lying in the bed with no acute distress.  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 Left pupil is reacting to light and accommodation.. No scleral icterus. Extraocular muscles intact.  HEENT: Head atraumatic, normocephalic. Oropharynx and nasopharynx clear. Left facial droop noted NECK:  Supple, no jugular venous distention. No thyroid enlargement, no tenderness.  LUNGS: Normal breath sounds bilaterally, no wheezing, rales,rhonchi or crepitation. No use  of accessory muscles of respiration. Decreased bibasilar breath sounds CARDIOVASCULAR: S1, S2 normal. No murmurs, rubs, or gallops.  ABDOMEN: Soft, nontender, nondistended. Bowel sounds present. No organomegaly or mass.  EXTREMITIES: No pedal edema, cyanosis, or clubbing.  NEUROLOGIC: Cranial nerves II through XII are intact. 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 Left facial droop noted. Strength on the left side is 0 x 5 in both upper and lower extremities due to previous stroke. Right-sided strength is 5/5. Sensation intact. Gait not checked.  PSYCHIATRIC: The patient is alert and oriented x 3.  SKIN: No obvious rash, lesion, or ulcer.    LABORATORY PANEL:   CBC Recent Labs  Lab 05/20/17 0531  WBC 14.3*  HGB 14.6  HCT 43.8  PLT 138*   ------------------------------------------------------------------------------------------------------------------  Chemistries  Recent Labs  Lab 05/19/17 1950  05/21/17 0631  NA 136   < > 137  K 3.7   < > 3.3*  CL 104   < > 104  CO2 20*   < > 22  GLUCOSE 191*   < > 141*  BUN 14   < > 12  CREATININE 0.91   < > 0.79  CALCIUM 9.2   < > 8.8*  AST 28  --   --   ALT 17  --   --   ALKPHOS 64  --   --   BILITOT 1.9*  --   --    < > = values in this interval not displayed.   ------------------------------------------------------------------------------------------------------------------  Cardiac Enzymes Recent Labs  Lab 05/19/17 1950  TROPONINI <0.03   ------------------------------------------------------------------------------------------------------------------  RADIOLOGY:  Ct Head Wo Contrast  Result Date: 05/19/2017 CLINICAL DATA:  New onset of  seizure-like activity. Stroke in March with left-sided weakness. EXAM: CT HEAD WITHOUT CONTRAST TECHNIQUE: Contiguous axial images were obtained from the base of the skull through the vertex without intravenous contrast. COMPARISON:  09/15/2016 FINDINGS: Brain: Encephalomalacia  involving the right MCA distribution, consistent with remote infarct. Ex vacuo dilatation of the right lateral ventricle. Mild cerebral and cerebellar atrophy for age. No mass lesion, hemorrhage, hydrocephalus, acute infarct, intra-axial, or extra-axial fluid collection. Vascular: Intracranial atherosclerosis. Skull: Normal Sinuses/Orbits: Normal imaged portions of the orbits and globes. Mucous retention cyst or polyp in the left ethmoid air cells. Clear mastoid air cells. Other: None. IMPRESSION: 1.  No acute intracranial abnormality. 2. 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 Electronically Signed   By: Abigail Miyamoto M.D.   On: 05/19/2017 21:29   Dg Chest Portable 1 View  Result Date: 05/19/2017 CLINICAL DATA:  Seizure-like activity.  Concern for pneumonia. EXAM: PORTABLE CHEST 1 VIEW COMPARISON:  08/23/2016 FINDINGS: Numerous leads and wires project over the chest. Left cervical vascular stent. Normal heart size. 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 No pleural fluid. Clear lungs. IMPRESSION: No acute cardiopulmonary disease. 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 Electronically Signed   By: Abigail Miyamoto M.D.   On: 05/19/2017 20:38   Ct Angio Abd/pel W And/or Wo Contrast  Result Date: 05/19/2017 CLINICAL DATA:  Abdominal aortic aneurysm, post repair. EXAM: CTA ABDOMEN AND PELVIS wITHOUT AND WITH CONTRAST TECHNIQUE: Multidetector CT imaging of the abdomen and pelvis was performed using the standard protocol during bolus administration of intravenous contrast. Multiplanar reconstructed images and MIPs were obtained and reviewed to evaluate the vascular anatomy. CONTRAST:  154mL ISOVUE-370 IOPAMIDOL (ISOVUE-370) INJECTION 76% COMPARISON:  CT angiography chest abdomen pelvis 09/09/2016 FINDINGS: VASCULAR Aorta: Fusiform aneurysm of the infrarenal aorta extends across the iliac bifurcation, maximal dimension 6.8 cm, unchanged. Large amount of mural thrombus within the  aneurysm sac with calcifications, not significantly changed. Aneurysm arises approximately 6.7 cm distal to the left renal artery. No evidence of aneurysm repair or stent graft. Calcified noncalcified plaque of the suprarenal aorta, similar to prior exam. No periaortic stranding or retroperitoneal hemorrhage. Celiac: Proximal short-segment stenosis of the mid level secondary to median arcuate ligament of the diaphragm, unchanged. Patent branches distally. SMA: Patent without evidence of aneurysm, dissection, vasculitis or significant stenosis. Renals: Both renal arteries are patent without evidence of aneurysm, dissection, vasculitis, fibromuscular dysplasia or significant stenosis. IMA: Short-segment origin stenosis, unchanged. Distal reconstitution via collaterals again seen. Inflow: Contiguous extension of aortic aneurysm into bilateral common iliac arteries. On the left this this involves a short segment maximal dimension 3.2 cm, unchanged. On the right this extends to the external iliac bifurcation, maximal dimension 4.1 cm, unchanged. At the level of the right internal and external bifurcation this 18 mm, unchanged. Calcified plaque throughout bilateral external iliac arteries with scattered areas of mild stenosis. Proximal Outflow: Bilateral common femoral and visualized portions of the superficial and profunda femoral arteries are patent without evidence of aneurysm, dissection, vasculitis or significant stenosis. Veins: Infrarenal IVC filter in place. Portal and mesenteric veins are patent. Aneurysm sac causes mass effect on the right external iliac vein with decreased intraluminal density which extends to the common femoral vein. A discrete filling defect is not seen. Review of the MIP images confirms the above findings. NON-VASCULAR Lower chest: Hypoventilatory changes at the lung bases, left greater than right. No consolidation. Emphysema suspected. No pleural fluid. Hepatobiliary: No focal hepatic  lesion. Gallbladder physiologically distended, no calcified stone. No biliary  dilatation. Pancreas: No ductal dilatation or inflammation. Spleen: Normal in size without focal abnormality. Adrenals/Urinary Tract: Normal adrenal glands. No hydronephrosis or perinephric edema. Subcentimeter hypodensity in the mid anterior left kidney is too small to characterize but likely cyst. Urinary bladder is not distended and not well evaluated. Stomach/Bowel: No bowel obstruction, inflammation, or evidence of wall thickening. Stomach is nondistended. Normal appendix. Moderate colonic stool burden. Lymphatic: No enlarged abdominal or pelvic lymph nodes. Reproductive: Enlarged prostate gland spanning 5.5 cm causing mass effect on the bladder base. Other: No free air or ascites. Minimal fat on the left inguinal canal. Musculoskeletal: There are no acute or suspicious osseous abnormalities. Stable degenerative change at L5-S1. IMPRESSION: VASCULAR 1. 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 No evidence of rupture. This is unchanged from CTA 8 months prior. No evidence of aneurysm repair as stated in history, recommend correlation with surgical history. 2. IVC filter in place. Relatively decreased density within the right common iliac, external iliac and common femoral vein compared to the left without discrete filling defect, may be secondary to slow flow. Duplex ultrasound could provide are detailed evaluation of the lower extremity venous structures based on clinical concern. NON-VASCULAR 1. No acute abnormality in the abdomen/pelvis. 2. Prostatic enlargement again seen. Electronically Signed   By: Jeb Levering M.D.   On: 05/19/2017 21:45    EKG:   Orders placed or performed during the hospital encounter of 05/19/17  . EKG 12-Lead  . EKG 12-Lead    ASSESSMENT AND PLAN:   61 year old male  with past medical history significant for CVA with left-sided hemiparesis, COPD, diabetes and hypertension results to hospital from nursing home secondary to seizure-like episode  #1 seizure-like episode-no previous history of seizure but had stroke with left hemiparesis. -Appreciate neurology consult. EEG with  no epileptiform activity.Reubin Milan get the MRI done due to his IVC filter. Neurology recommended no antiepileptic drugs at this time. An outpatient MRI of the brain. -Could have been a Rigor because of underlying UTI and fevers. Did not have any further episodes here. -on seizure precautions.  #2 h/o of CVA-currently living at [facility] and wheelchair-bound. Has complete left-sided hemiparesis- -Continue aspirin and Plavix and added statin  #3 hypertension-on metoprolol and lisinopril  #4 hypokalemia - replaced  #5 acute cystitis-follow up urine and blood cultures. Agree with Rocephin at this time  #6 DVT prophylaxis-on Lovenox   Discharge to [facility] tomorrow   All the records are reviewed and case discussed with Care Management/Social Workerr. Management plans discussed with the patient, family and they are in agreement.  CODE STATUS: Full Code  TOTAL TIME TAKING CARE OF THIS PATIENT: 37 minutes.   POSSIBLE D/C IN 1-2 DAYS, DEPENDING ON CLINICAL CONDITION.   Gladstone Lighter M.D on 05/21/2017 at 9:55 AM  Between 7am to 6pm - Pager - [phone]  After 6pm go to [email or web address] - password [email or web address] [place] [facility]  Office  [phone]  CC: Primary care physician; Center, [facility]

## 2017-05-21 NOTE — Plan of Care (Signed)
  Progressing Clinical Measurements: Complications related to the disease process, condition or treatment will be avoided or minimized 05/21/2017 1114 - Progressing by Etheleen Nicks, RN Note No seizure activity noted   Not Progressing Clinical Measurements: Diagnostic test results will improve 05/21/2017 1114 - Not Progressing by Etheleen Nicks, RN Note Potassium 3.3 this am, replacing today   Not Applicable Clinical Measurements: Will remain free from infection 84/06/2818 [id number] - Not Applicable by Etheleen Nicks, RN

## 2017-05-22 LAB — BASIC METABOLIC PANEL
ANION GAP: 10 (ref 5–15)
BUN: 18 mg/dL (ref 6–20)
CALCIUM: 9.1 mg/dL (ref 8.9–10.3)
CO2: 20 mmol/L — AB (ref 22–32)
CREATININE: 0.79 mg/dL (ref 0.61–1.24)
Chloride: 109 mmol/L (ref 101–111)
Glucose, Bld: 130 mg/dL — ABNORMAL HIGH (ref 65–99)
Potassium: 3.7 mmol/L (ref 3.5–5.1)
SODIUM: 139 mmol/L (ref 135–145)

## 2017-05-22 LAB — GLUCOSE, CAPILLARY
GLUCOSE-CAPILLARY: 104 mg/dL — AB (ref 65–99)
Glucose-Capillary: 153 mg/dL — ABNORMAL HIGH (ref 65–99)

## 2017-05-22 LAB — URINE CULTURE: Culture: 70000 — AB

## 2017-05-22 MED ORDER — HYDROCODONE-ACETAMINOPHEN 5-325 MG PO TABS: 1.0000 | ORAL_TABLET | Freq: Every day | ORAL | 0 refills | Status: DC | PRN

## 2017-05-22 MED ORDER — INSULIN GLARGINE 100 UNIT/ML ~~LOC~~ SOLN: 10.0000 [IU] | Freq: Every day | SUBCUTANEOUS | 1 refills | Status: DC

## 2017-05-22 MED ORDER — CEFTRIAXONE SODIUM 1 G IJ SOLR: 1.0000 g | INTRAMUSCULAR | 0 refills | Status: AC

## 2017-05-22 MED ORDER — DEXTROSE 5 % IV SOLN
1.0000 g | INTRAVENOUS | Status: DC
  Administered 2017-05-22: 1 g via INTRAVENOUS
  Filled 2017-05-22: qty 10

## 2017-05-22 NOTE — Discharge Summary (Addendum)
[place] [facility] [place] NAME: Chad Combs    MR#:  [id number]  DATE OF BIRTH:  06-10-1956  DATE OF ADMISSION:  05/19/2017   ADMITTING PHYSICIAN: Lance Coon, MD  DATE OF DISCHARGE:  05/22/17  PRIMARY CARE PHYSICIAN: [facility] [place]   ADMISSION DIAGNOSIS:   Acute cystitis with hematuria [N30.01] Acute encephalopathy [G93.40]  DISCHARGE DIAGNOSIS:   Principal Problem:   Seizure-like activity ([place]) Active Problems:   Benign essential HTN   Chronic obstructive pulmonary disease ([employer])   Type II diabetes mellitus with neurological manifestations, uncontrolled ([place])   Hyperlipidemia   UTI (urinary tract infection)   SECONDARY DIAGNOSIS:   Past Medical History:  Diagnosis Date  . Blind right eye   . COPD (chronic obstructive pulmonary disease) ([place])   . Diabetes mellitus without complication ([place])   . Hypertension     HOSPITAL COURSE:   61 year old male with past medical history significant for CVA with left-sided hemiparesis, COPD, diabetes and hypertension results to hospital from nursing home secondary to seizure-like episode  #1 seizure-like episode-no previous history of seizure but had stroke with left hemiparesis. -Appreciate neurology consult. EEG with  no epileptiform activity.Reubin Milan get the MRI done due to his IVC filter. Neurology recommended no antiepileptic drugs at this time. An outpatient MRI of the brain if needed. -Could have been a Rigor because of underlying UTI and fevers. Did not have any further episodes here. -on seizure precautions.  #2 h/o of CVA-currently living at [facility] and wheelchair-bound. Has complete left-sided hemiparesis- -Continue aspirin and Plavix -Not sure if he developed an allergy to statin.  He is currently on Zetia which we will continue  #3 hypertension-on metoprolol and lisinopril  #4 hypokalemia - replaced  #5 acute cystitis-urine cultures  growing providencia which is only sensitive to Rocephin, imipenem and Zosyn.  - Patient has received Rocephin here in the hospital.  We will discharge him on intramuscular  for 6 more days   #6 diabetes mellitus-on metformin and sliding scale insulin.  Restart his home Lantus however at a lower dose.  Because his sugars have been fine in the hospital.   Discharge to [facility] today     DISCHARGE CONDITIONS:   Guarded  CONSULTS OBTAINED:   Treatment Team:  Alexis Goodell, MD  DRUG ALLERGIES:   No Known Allergies DISCHARGE MEDICATIONS:   Allergies as of 05/22/2017   No Known Allergies     Medication List    STOP taking these medications   rivaroxaban 20 MG Tabs tablet Commonly known as:  XARELTO   senna-docusate 8.6-50 MG tablet Commonly known as:  Senokot-S     TAKE these medications   acetaminophen 500 MG tablet Commonly known as:  TYLENOL Take 500 mg by mouth at bedtime as needed.   aspirin 81 MG chewable tablet Chew 81 mg by mouth daily.   bisacodyl 5 MG EC tablet Generic drug:  bisacodyl Take 10 mg by mouth daily as needed for moderate constipation.   budesonide-formoterol 80-4.5 MCG/ACT inhaler Commonly known as:  SYMBICORT Inhale 2 puffs into the lungs every 12 (twelve) hours.   cefTRIAXone 1 g injection Commonly known as:  ROCEPHIN Inject 1 g into the muscle daily for 6 days. Start taking on:  05/23/2017   clopidogrel 75 MG tablet Commonly known as:  PLAVIX Take 75 mg by mouth daily.   diclofenac sodium 1 % Gel Commonly known as:  VOLTAREN Apply 2-4  g topically 2 (two) times daily. APPLY 2MG  TO LEFT SHOULDER TWICE DAILY AND 4 GRAMS TO LEFT HIP AT BEDTIME   ezetimibe 10 MG tablet Commonly known as:  ZETIA Take 1 tablet (10 mg total) by mouth daily at 6 PM. What changed:  when to take this   famotidine 40 MG tablet Commonly known as:  PEPCID Take 1 tablet (40 mg total) by mouth 2 (two) times daily.   fluticasone 50 MCG/ACT nasal  spray Commonly known as:  FLONASE Place 1 spray into both nostrils daily.   HYDROcodone-acetaminophen 5-325 MG tablet Commonly known as:  NORCO/VICODIN Take 1 tablet by mouth daily as needed for moderate pain.   insulin glargine 100 UNIT/ML injection Commonly known as:  LANTUS Inject 0.1 mLs (10 Units total) into the skin at bedtime. What changed:  how much to take   lisinopril 20 MG tablet Commonly known as:  PRINIVIL,ZESTRIL Take 20 mg by mouth daily.   Melatonin 5 MG Tabs Take 1 tablet by mouth at bedtime as needed.   metFORMIN 1000 MG tablet Commonly known as:  GLUCOPHAGE Take 1,000 mg by mouth 2 (two) times daily with a meal.   metoprolol tartrate 25 MG tablet Commonly known as:  LOPRESSOR Take 0.5 tablets (12.5 mg total) by mouth 2 (two) times daily.   NOVOLOG 100 UNIT/ML injection Generic drug:  insulin aspart Inject 0-9 Units into the skin 3 (three) times daily. SLIDING SCALE: 0-99 (0u), 100-149 (2u), 150-199 (3u), 200-249 (4u), 250-299 (5u), 300-349 (6u), 350-399 (7u), 400-449 (8u), 450-499 (9u) - IF 500+ CONTACT PHYSICIAN   polyethylene glycol packet Commonly known as:  MIRALAX / GLYCOLAX Take 17 g by mouth daily. MIX 17 GRAMS IN 4 TO 8 OUNCES WATER AND GIVE EVERY MORNING   senna 8.6 MG Tabs tablet Commonly known as:  SENOKOT Take 1 tablet by mouth 2 (two) times daily.   sertraline 50 MG tablet Commonly known as:  ZOLOFT Take 50 mg by mouth daily.   tamsulosin 0.4 MG Caps capsule Commonly known as:  FLOMAX Take 1 capsule (0.4 mg total) by mouth daily after supper.   tiZANidine 2 MG tablet Commonly known as:  ZANAFLEX Take 1 tablet (2 mg total) by mouth at bedtime. What changed:    how much to take  when to take this  additional instructions        DISCHARGE INSTRUCTIONS:   1. PCP f/u in 1-2 weeks  DIET:   Cardiac diet  ACTIVITY:   Activity as tolerated  OXYGEN:   Home Oxygen: No.  Oxygen Delivery: room air  DISCHARGE LOCATION:     nursing home   If you experience worsening of your admission symptoms, develop shortness of breath, life threatening emergency, suicidal or homicidal thoughts you must seek medical attention immediately by calling 911 or calling your MD immediately  if symptoms less severe.  You Must read complete instructions/literature along with all the possible adverse reactions/side effects for all the Medicines you take and that have been prescribed to you. Take any new Medicines after you have completely understood and accpet all the possible adverse reactions/side effects.   Please note  You were cared for by a hospitalist during your hospital stay. If you have any questions about your discharge medications or the care you received while you were in the hospital after you are discharged, you can call the unit and asked to speak with the hospitalist on call if the hospitalist that took care of you is not  available. Once you are discharged, your primary care physician will handle any further medical issues. Please note that NO REFILLS for any discharge medications will be authorized once you are discharged, as it is imperative that you return to your primary care physician (or establish a relationship with a primary care physician if you do not have one) for your aftercare needs so that they can reassess your need for medications and monitor your lab values.    On the day of Discharge:  VITAL SIGNS:   Blood pressure (!) 153/74, pulse 64, temperature 98.1 F (36.7 C), temperature source Oral, resp. rate 18, height 5\' 9"  (1.753 m), weight 82.1 kg (181 lb 1.6 oz), SpO2 96 %.  PHYSICAL EXAMINATION:    GENERAL:  61 y.o.-year-old patient lying in the bed with no acute distress.  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 Left pupil is reacting to light and accommodation.. No scleral icterus. Extraocular muscles intact.  HEENT: Head atraumatic,  normocephalic. Oropharynx and nasopharynx clear. Left facial droop noted NECK:  Supple, no jugular venous distention. No thyroid enlargement, no tenderness.  LUNGS: Normal breath sounds bilaterally, no wheezing, rales,rhonchi or crepitation. No use of accessory muscles of respiration. Decreased bibasilar breath sounds CARDIOVASCULAR: S1, S2 normal. No murmurs, rubs, or gallops.  ABDOMEN: Soft, nontender, nondistended. Bowel sounds present. No organomegaly or mass.  EXTREMITIES: No pedal edema, cyanosis, or clubbing.  NEUROLOGIC: Cranial nerves II through XII are intact. 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 Left facial droop noted. Strength on the left side is 0 x 5 in both upper and lower extremities due to previous stroke. Right-sided strength is 5/5. Sensation intact. Gait not checked.  PSYCHIATRIC: The patient is alert and oriented x 3.  SKIN: No obvious rash, lesion, or ulcer.     DATA REVIEW:   CBC Recent Labs  Lab 05/20/17 0531  WBC 14.3*  HGB 14.6  HCT 43.8  PLT 138*    Chemistries  Recent Labs  Lab 05/19/17 1950  05/22/17 0542  NA 136   < > 139  K 3.7   < > 3.7  CL 104   < > 109  CO2 20*   < > 20*  GLUCOSE 191*   < > 130*  BUN 14   < > 18  CREATININE 0.91   < > 0.79  CALCIUM 9.2   < > 9.1  AST 28  --   --   ALT 17  --   --   ALKPHOS 64  --   --   BILITOT 1.9*  --   --    < > = values in this interval not displayed.     Microbiology Results  Results for orders placed or performed during the hospital encounter of 05/19/17  Blood culture (routine x 2)     Status: None (Preliminary result)   Collection Time: 05/20/17 12:04 AM  Result Value Ref Range Status   Specimen Description BLOOD RIGHT ARM  Final   Special Requests BOTTLES DRAWN AEROBIC AND ANAEROBIC BCLV  Final   Culture NO GROWTH 2 DAYS  Final   Report Status PENDING  Incomplete  Blood culture (routine x 2)     Status: None (Preliminary result)   Collection Time: 05/20/17 12:04 AM    Result Value Ref Range Status   Specimen Description BLOOD RIGHT ARM  Final   Special Requests BOTTLES DRAWN AEROBIC  AND ANAEROBIC BCHV  Final   Culture NO GROWTH 2 DAYS  Final   Report Status PENDING  Incomplete  MRSA PCR Screening     Status: None   Collection Time: 05/20/17  1:54 AM  Result Value Ref Range Status   MRSA by PCR NEGATIVE NEGATIVE Final    Comment:        The GeneXpert MRSA Assay (FDA approved for NASAL specimens only), is one component of a comprehensive MRSA colonization surveillance program. It is not intended to diagnose MRSA infection nor to guide or monitor treatment for MRSA infections.   Urine culture     Status: Abnormal   Collection Time: 05/20/17  2:03 AM  Result Value Ref Range Status   Specimen Description URINE, RANDOM  Final   Special Requests NONE  Final   Culture 70,000 COLONIES/mL PROVIDENCIA STUARTII (A)  Final   Report Status 05/22/2017 FINAL  Final   Organism ID, Bacteria PROVIDENCIA STUARTII (A)  Final      Susceptibility   Providencia stuartii - MIC*    AMPICILLIN >=32 RESISTANT Resistant     CEFAZOLIN >=64 RESISTANT Resistant     CEFTRIAXONE <=1 SENSITIVE Sensitive     CIPROFLOXACIN >=4 RESISTANT Resistant     GENTAMICIN RESISTANT Resistant     IMIPENEM 2 SENSITIVE Sensitive     NITROFURANTOIN 128 RESISTANT Resistant     TRIMETH/SULFA >=320 RESISTANT Resistant     AMPICILLIN/SULBACTAM >=32 RESISTANT Resistant     PIP/TAZO <=4 SENSITIVE Sensitive     * 70,000 COLONIES/mL PROVIDENCIA STUARTII    RADIOLOGY:  No results found.   Management plans discussed with the patient, family and they are in agreement.  CODE STATUS:     Code Status Orders  (From admission, onward)        Start     Ordered   05/20/17 0127  Full code  Continuous     05/20/17 0126    Code Status History    Date Active Date Inactive Code Status Order ID Comments User Context   08/25/2016 14:46 10/01/2016 19:47 Full Code [id number]  Flora Lipps  Inpatient   08/25/2016 14:46 08/25/2016 14:46 Full Code [id number]  Bary Leriche, PA-C Inpatient   08/22/2016 01:10 08/25/2016 14:39 Full Code [id number]  Hugelmeyer, Ubaldo Glassing, DO Inpatient      TOTAL TIME TAKING CARE OF THIS PATIENT: 38 minutes.    Gladstone Lighter M.D on 05/22/2017 at 9:19 AM  Between 7am to 6pm - Pager - [phone]  After 6pm go to [email or web address] - Technical [employer] [place] [facility]  Office  [phone]  CC: Primary care physician; [facility]   Note: This dictation was prepared with Dragon dictation along with smaller phrase technology. Any transcriptional errors that result from this process are unintentional.

## 2017-05-22 NOTE — Progress Notes (Signed)
Pt transported via EMS. 

## 2017-05-22 NOTE — Clinical Social Work Note (Signed)
Clinical Social Work Assessment  Patient Details  Name: Chad Combs MRN: [id number] Date of Birth: [birth year]  Date of referral:  05/22/17               Reason for consult:  Facility Placement                Permission sought to share information with:  Facility Art therapist granted to share information::  Yes, Verbal Permission Granted  Name::        Agency::     Relationship::     Contact Information:     Housing/Transportation Living arrangements for the past 2 months:  [place] of Information:  Patient, Medical Team, Facility Patient Interpreter Needed:  None Criminal Activity/Legal Involvement Pertinent to Current Situation/Hospitalization:  No - Comment as needed Significant Relationships:  Adult Children Lives with:  Facility Resident Do you feel safe going back to the place where you live?  Yes Need for family participation in patient care:  No (Coment)  Care giving concerns:  Patient admitted from facility   Social Worker assessment / plan:  CSW met with the patient at bedside to discuss discharge planning. The patient confirmed that he is a LTC resident at [facility] and would like to return. The patient confirmed that he would prefer EMS transport due to hx of seizure activity and inability to ambulate.  Nursing Supervisor Amy at [facility] confirmed that the patient could return today with IM antibiotics. The CSW has delivered the packet to the chart and updated the attending MD and RN. CSW will continue to follow pending additional discharge needs.  Employment status:  Retired Forensic scientist:  Medicaid In [place] PT Recommendations:  Not assessed at this time Information / Referral to community resources:     Patient/Family's Response to care:  The patient thanked the CSW  Patient/Family's Understanding of and Emotional Response to Diagnosis, Current Treatment, and Prognosis:  The patient understands the  discharge plan and is in agreement.  Emotional Assessment Appearance:  Appears older than stated age Attitude/Demeanor/Rapport:  (Pleasant and alert) Affect (typically observed):  Appropriate, Pleasant Orientation:  Oriented to Self, Oriented to Place, Oriented to  Time, Oriented to Situation Alcohol / Substance use:  Never Used Psych involvement (Current and /or in the community):  No (Comment)  Discharge Needs  Concerns to be addressed:  Care Coordination, Discharge Planning Concerns Readmission within the last 30 days:  No Current discharge risk:  None Barriers to Discharge:  No Barriers Identified   Zettie Pho, LCSW 05/22/2017, 10:24 AM

## 2017-05-22 NOTE — Progress Notes (Signed)
Discharge instructions given. Tele off and IV out. Pt verbalized  Understanding. Report called to [facility], report given to Amy. Pt will be transported via EMS.

## 2017-05-22 NOTE — Plan of Care (Signed)
  Adequate for Discharge Education: Knowledge of General Education information will improve 05/22/2017 1122 - Adequate for Discharge by Feliberto Gottron, [place] Behavior/Discharge Planning: Ability to manage health-related needs will improve 05/22/2017 1122 - Adequate for Discharge by Feliberto Gottron, RN Clinical Measurements: Ability to maintain clinical measurements within normal limits will improve 05/22/2017 1122 - Adequate for Discharge by Feliberto Gottron, RN Diagnostic test results will improve 05/22/2017 1122 - Adequate for Discharge by Feliberto Gottron, RN Respiratory complications will improve 05/22/2017 1122 - Adequate for Discharge by Feliberto Gottron, RN Cardiovascular complication will be avoided 05/22/2017 1122 - Adequate for Discharge by Chriss Czar, Illene Bolus, RN Activity: Risk for activity intolerance will decrease 05/22/2017 1122 - Adequate for Discharge by Feliberto Gottron, RN Nutrition: Adequate nutrition will be maintained 05/22/2017 1122 - Adequate for Discharge by Feliberto Gottron, RN Coping: Level of anxiety will decrease 05/22/2017 1122 - Adequate for Discharge by Chriss Czar, Illene Bolus, RN Elimination: Will not experience complications related to bowel motility 05/22/2017 1122 - Adequate for Discharge by Chriss Czar, Illene Bolus, RN Will not experience complications related to urinary retention 05/22/2017 1122 - Adequate for Discharge by Feliberto Gottron, RN Pain Managment: General experience of comfort will improve 05/22/2017 1122 - Adequate for Discharge by Feliberto Gottron, RN Safety: Ability to remain free from injury will improve 05/22/2017 1122 - Adequate for Discharge by Chriss Czar, Illene Bolus, RN Skin Integrity: Risk for impaired skin integrity will decrease 05/22/2017 1122 - Adequate for Discharge by Feliberto Gottron, RN Education: Expressions of having a comfortable  level of knowledge regarding the disease process will increase 05/22/2017 1122 - Adequate for Discharge by Feliberto Gottron, RN Coping: Ability to adjust to condition or change in health will improve 05/22/2017 1122 - Adequate for Discharge by Feliberto Gottron, RN Ability to identify appropriate support needs will improve 05/22/2017 1122 - Adequate for Discharge by Feliberto Gottron, RN Health Behavior/Discharge Planning: Compliance with prescribed medication regimen will improve 05/22/2017 1122 - Adequate for Discharge by Feliberto Gottron, RN Medication: Risk for medication side effects will decrease 05/22/2017 1122 - Adequate for Discharge by Feliberto Gottron, RN Clinical Measurements: Complications related to the disease process, condition or treatment will be avoided or minimized 05/22/2017 1122 - Adequate for Discharge by Feliberto Gottron, RN Diagnostic test results will improve 05/22/2017 1122 - Adequate for Discharge by Feliberto Gottron, RN Safety: Verbalization of understanding the information provided will improve 05/22/2017 1122 - Adequate for Discharge by Feliberto Gottron, RN Self-Concept: Level of anxiety will decrease 05/22/2017 1122 - Adequate for Discharge by Feliberto Gottron, RN Ability to verbalize feelings about condition will improve 05/22/2017 1122 - Adequate for Discharge by Feliberto Gottron, RN

## 2017-05-25 LAB — CULTURE, BLOOD (ROUTINE X 2)
CULTURE: NO GROWTH
Culture: NO GROWTH

## 2017-05-27 ENCOUNTER — Encounter: Payer: Self-pay | Admitting: Emergency Medicine

## 2017-05-27 ENCOUNTER — Emergency Department
Admission: EM | Admit: 2017-05-27 | Discharge: 2017-05-27 | Disposition: A | Discharge status: Skilled Nursing Facility | Payer: Medicaid Other | Attending: Emergency Medicine | Admitting: Emergency Medicine

## 2017-05-27 ENCOUNTER — Other Ambulatory Visit: Payer: Self-pay

## 2017-05-27 ENCOUNTER — Emergency Department: Payer: Medicaid Other

## 2017-05-27 DIAGNOSIS — Z7902 Long term (current) use of antithrombotics/antiplatelets: Secondary | ICD-10-CM | POA: Insufficient documentation

## 2017-05-27 DIAGNOSIS — R569 Unspecified convulsions: Secondary | ICD-10-CM

## 2017-05-27 DIAGNOSIS — R251 Tremor, unspecified: Secondary | ICD-10-CM | POA: Insufficient documentation

## 2017-05-27 DIAGNOSIS — F17210 Nicotine dependence, cigarettes, uncomplicated: Secondary | ICD-10-CM | POA: Diagnosis not present

## 2017-05-27 DIAGNOSIS — Z7982 Long term (current) use of aspirin: Secondary | ICD-10-CM | POA: Insufficient documentation

## 2017-05-27 DIAGNOSIS — E1149 Type 2 diabetes mellitus with other diabetic neurological complication: Secondary | ICD-10-CM | POA: Insufficient documentation

## 2017-05-27 DIAGNOSIS — Z794 Long term (current) use of insulin: Secondary | ICD-10-CM | POA: Diagnosis not present

## 2017-05-27 DIAGNOSIS — J449 Chronic obstructive pulmonary disease, unspecified: Secondary | ICD-10-CM | POA: Insufficient documentation

## 2017-05-27 DIAGNOSIS — Z79899 Other long term (current) drug therapy: Secondary | ICD-10-CM | POA: Insufficient documentation

## 2017-05-27 LAB — URINALYSIS, COMPLETE (UACMP) WITH MICROSCOPIC
BILIRUBIN URINE: NEGATIVE
Bacteria, UA: NONE SEEN
Glucose, UA: NEGATIVE mg/dL
HGB URINE DIPSTICK: NEGATIVE
Ketones, ur: NEGATIVE mg/dL
LEUKOCYTES UA: NEGATIVE
NITRITE: NEGATIVE
PH: 5 (ref 5.0–8.0)
Protein, ur: 100 mg/dL — AB
SPECIFIC GRAVITY, URINE: 1.019 (ref 1.005–1.030)

## 2017-05-27 LAB — COMPREHENSIVE METABOLIC PANEL
ALK PHOS: 77 U/L (ref 38–126)
ALT: 28 U/L (ref 17–63)
ANION GAP: 14 (ref 5–15)
AST: 36 U/L (ref 15–41)
Albumin: 4 g/dL (ref 3.5–5.0)
BILIRUBIN TOTAL: 0.6 mg/dL (ref 0.3–1.2)
BUN: 16 mg/dL (ref 6–20)
CALCIUM: 9.4 mg/dL (ref 8.9–10.3)
CO2: 20 mmol/L — ABNORMAL LOW (ref 22–32)
Chloride: 106 mmol/L (ref 101–111)
Creatinine, Ser: 1.05 mg/dL (ref 0.61–1.24)
GFR calc non Af Amer: >60 mL/min (ref >60)
Glucose, Bld: 117 mg/dL — ABNORMAL HIGH (ref 65–99)
Potassium: 3.3 mmol/L — ABNORMAL LOW (ref 3.5–5.1)
Sodium: 140 mmol/L (ref 135–145)
TOTAL PROTEIN: 8.2 g/dL — AB (ref 6.5–8.1)

## 2017-05-27 LAB — CBC WITH DIFFERENTIAL/PLATELET
Basophils Absolute: 0.1 10*3/uL (ref 0–0.1)
Basophils Relative: 1 %
Eosinophils Absolute: 0.2 10*3/uL (ref 0–0.7)
Eosinophils Relative: 2 %
HEMATOCRIT: 47.1 % (ref 40.0–52.0)
HEMOGLOBIN: 15.6 g/dL (ref 13.0–18.0)
LYMPHS ABS: 3.1 10*3/uL (ref 1.0–3.6)
Lymphocytes Relative: 30 %
MCH: 32.3 pg (ref 26.0–34.0)
MCHC: 33.1 g/dL (ref 32.0–36.0)
MCV: 97.6 fL (ref 80.0–100.0)
MONOS PCT: 8 %
Monocytes Absolute: 0.8 10*3/uL (ref 0.2–1.0)
NEUTROS ABS: 6.2 10*3/uL (ref 1.4–6.5)
NEUTROS PCT: 59 %
Platelets: 287 10*3/uL (ref 150–440)
RBC: 4.82 MIL/uL (ref 4.40–5.90)
RDW: 13.4 % (ref 11.5–14.5)
WBC: 10.4 10*3/uL (ref 3.8–10.6)

## 2017-05-27 MED ORDER — ACETAMINOPHEN 325 MG PO TABS
ORAL_TABLET | ORAL | Status: AC
  Administered 2017-05-27: 650 mg via ORAL
  Filled 2017-05-27: qty 2

## 2017-05-27 MED ORDER — ACETAMINOPHEN 325 MG PO TABS
650.0000 mg | ORAL_TABLET | Freq: Once | ORAL | Status: AC
  Administered 2017-05-27: 650 mg via ORAL

## 2017-05-27 NOTE — ED Triage Notes (Signed)
Pt had 2 minute clonic tonic seizure at [facility] today. Denies pain. Remembers whole thing per pt. On abx for UTI; seizure 2 weeks ago when dx with UTI. No loss bowel or bladder. Pt alert and oriented. No pain.

## 2017-05-27 NOTE — Discharge Instructions (Signed)
You are evaluated for shaking episode, and although no certain cause was found, your exam and evaluation are overall reassuring in the emergency department today.  It is not suspected that this episode was due to a true seizure.  It is recommended that he follow with your primary care doctor, and with a neurologist.   Return to the emergency room immediately for any altered mental status, tongue biting, loss of continence, that accompanies any movement or shaking activities.  Return for any fevers or any other condition concerning to you.

## 2017-05-27 NOTE — ED Provider Notes (Signed)
[facility] Emergency Department Provider Note ____________________________________________   I have reviewed the triage vital signs and the triage nursing note.  HISTORY  Chief Complaint Seizures   Historian Patient  HPI Chad Combs is a 61 y.o. male presenting to the ED out of concern for shaking episode, possible seizure.  History is a little bit confused, obtained from nursing home report as well as EMS report from the nursing home that patient was having some shaking of his arms and his legs.  There was report that the patient really was not interacting much during that time and seemed a little bit confused.  Patient tells me he remembers shaking and remembers the whole event.  He apparently had [place] episode similar to this fairly recently when he was diagnosed with urinary infection and patient states that they told him it was not a seizure.  He was not started on medication at that point time.    Past Medical History:  Diagnosis Date  . Blind right eye   . COPD (chronic obstructive pulmonary disease) ([place])   . Diabetes mellitus without complication ([place])   . Hypertension     Patient Active Problem List   Diagnosis Date Noted  . UTI (urinary tract infection) 05/19/2017  . Seizure-like activity ([place]) 05/19/2017  . Hyperlipidemia 11/08/2016  . Type II diabetes mellitus with neurological manifestations, uncontrolled ([place]) 10/04/2016  . Dyslipidemia associated with type 2 diabetes mellitus ([place]) 10/04/2016  . AAA (abdominal aortic aneurysm) without rupture ([place])   . Acute deep vein thrombosis (DVT) of femoral vein of left lower extremity ([place])   . Pulmonary embolus ([place])   . Adjustment disorder   . 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 Left hemiplegia ([place])   . Dysarthria, post-stroke   . Dysphagia, post-stroke   . Carotid stenosis   . Benign essential HTN   . Leukocytosis   . Polycythemia vera ([place])   . Tobacco abuse   . Chronic  obstructive pulmonary disease ([place])   . 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    Past Surgical History:  Procedure Laterality Date  . IVC FILTER INSERTION N/A 08/24/2016   Procedure: IVC Filter Insertion;  Surgeon: Katha Cabal, MD;  Location: [facility];  Service: Cardiovascular;  Laterality: N/A;    Prior to Admission medications   Medication Sig Start Date End Date Taking? Authorizing Provider  acetaminophen (TYLENOL) 500 MG tablet Take 500 mg by mouth at bedtime as needed.     [provider]  aspirin 81 MG chewable tablet Chew 81 mg by mouth daily.    [provider]  bisacodyl (BISACODYL) 5 MG EC tablet Take 10 mg by mouth daily as needed for moderate constipation.    [provider]  budesonide-formoterol (SYMBICORT) 80-4.5 MCG/ACT inhaler Inhale 2 puffs into the lungs every 12 (twelve) hours.    [provider]  cefTRIAXone (ROCEPHIN) 1 g injection Inject 1 g into the muscle daily for 6 days. 05/23/17 05/29/17  Gladstone Lighter, MD  clopidogrel (PLAVIX) 75 MG tablet Take 75 mg by mouth daily.    [provider]  diclofenac sodium (VOLTAREN) 1 % GEL Apply 2-4 g topically 2 (two) times daily. APPLY 2MG  TO LEFT SHOULDER TWICE DAILY AND 4 GRAMS TO LEFT HIP AT BEDTIME    [provider]  ezetimibe (ZETIA) 10 MG tablet Take 1 tablet (10 mg total) by mouth daily at 6 PM. Patient taking differently: Take 10 mg by mouth daily.  10/01/16   Love, Ivan Anchors, PA-C  famotidine (PEPCID) 40 MG tablet Take 1 tablet (40 mg total) by mouth 2 (two) times daily. 10/01/16   Love, Ivan Anchors, PA-C  fluticasone (FLONASE) 50 MCG/ACT nasal spray Place 1 spray into both nostrils daily. 08/25/16   Max Sane, MD  HYDROcodone-acetaminophen (NORCO/VICODIN) 5-325 MG tablet Take 1 tablet by mouth daily as needed for moderate pain. 05/22/17   Gladstone Lighter, MD  insulin aspart (NOVOLOG) 100 UNIT/ML injection Inject 0-9 Units into the skin 3 (three) times daily.  SLIDING SCALE: 0-99 (0u), 100-149 (2u), 150-199 (3u), 200-249 (4u), 250-299 (5u), 300-349 (6u), 350-399 (7u), 400-449 (8u), 450-499 (9u) - IF 500+ CONTACT PHYSICIAN    [provider]  insulin glargine (LANTUS) 100 UNIT/ML injection Inject 0.1 mLs (10 Units total) into the skin at bedtime. 05/22/17   Gladstone Lighter, MD  lisinopril (PRINIVIL,ZESTRIL) 20 MG tablet Take 20 mg by mouth daily.    [provider]  Melatonin 5 MG TABS Take 1 tablet by mouth at bedtime as needed.    [provider]  metFORMIN (GLUCOPHAGE) 1000 MG tablet Take 1,000 mg by mouth 2 (two) times daily with a meal.    [provider]  metoprolol tartrate (LOPRESSOR) 25 MG tablet Take 0.5 tablets (12.5 mg total) by mouth 2 (two) times daily. 10/06/16   Hendricks Limes, MD  polyethylene glycol [facility] / Floria Raveling) packet Take 17 g by mouth daily. MIX 17 GRAMS IN 4 TO 8 OUNCES WATER AND GIVE EVERY MORNING    [provider]  senna (SENOKOT) 8.6 MG TABS tablet Take 1 tablet by mouth 2 (two) times daily.    [provider]  sertraline (ZOLOFT) 50 MG tablet Take 50 mg by mouth daily.    [provider]  tamsulosin (FLOMAX) 0.4 MG CAPS capsule Take 1 capsule (0.4 mg total) by mouth daily after supper. 10/01/16   Love, Ivan Anchors, PA-C  tiZANidine (ZANAFLEX) 2 MG tablet Take 1 tablet (2 mg total) by mouth at bedtime. Patient taking differently: Take 2-4 mg by mouth 4 (four) times daily. TAKE 4MG  BY MOUTH THREE TIMES DAILY AND 2MG  BY MOUTH AT BEDTIME 10/01/16   Love, Ivan Anchors, PA-C    No Known Allergies  Family History  Problem Relation Age of Onset  . Cancer Mother   . Heart disease Father     Social History Social History   Tobacco Use  . Smoking status: Current Every Day Smoker    Packs/day: 1.50    Years: 45.00    Pack years: 67.50    Types: Cigarettes  . Smokeless tobacco: Never Used  Substance Use Topics  . Alcohol use: Yes    Alcohol/week: 0.0 oz     Comment: occasional (once every couple months)  . Drug use: No    Review of Systems  Constitutional: Negative for recent fever. Eyes: Negative for visual changes. ENT: Negative for sore throat. Cardiovascular: Negative for chest pain. Respiratory: Negative for shortness of breath. Gastrointestinal: Negative for abdominal pain, vomiting and diarrhea. Genitourinary: Negative for dysuria. Musculoskeletal: Negative for back pain. Skin: Negative for rash. Neurological: Negative for headache.  No new weakness or numbness, he does have chronic left-sided weakness from prior stroke.  ____________________________________________   PHYSICAL EXAM:  VITAL SIGNS: ED Triage Vitals  Enc Vitals Group     BP 05/27/17 0920 (!) 148/99     Pulse Rate 05/27/17 0920 94     Resp 05/27/17 0920 16  Temp 05/27/17 0920 98.1 F (36.7 C)     Temp Source 05/27/17 0920 Oral     SpO2 05/27/17 0920 93 %     Weight 05/27/17 0916 181 lb (82.1 kg)     Height 05/27/17 0916 5\' 9"  (1.753 m)     Head Circumference --      Peak Flow --      Pain Score --      Pain Loc --      Pain Edu? --      Excl. in [place]? --      Constitutional: Alert and oriented. Well appearing and in no distress. HEENT   Head: Normocephalic and atraumatic.  Chronic left-sided facial droop.      Eyes: Conjunctivae are normal. Pupils equal and round.       Ears:         Nose: No congestion/rhinnorhea.   Mouth/Throat: Mucous membranes are moist.   Neck: No stridor. Cardiovascular/Chest: Normal rate, regular rhythm.  No murmurs, rubs, or gallops. Respiratory: Normal respiratory effort without tachypnea nor retractions. Breath sounds are clear and equal bilaterally. No wheezes/rales/rhonchi. Gastrointestinal: Soft. No distention, no guarding, no rebound. Nontender.    Genitourinary/rectal:Deferred Musculoskeletal: Nontender with normal range of motion in all extremities. No joint effusions.  No lower extremity tenderness.   No edema. Neurologic: Left-sided facial droop.  Otherwise alert and oriented, normal speech and language.  Some left-sided arm and leg weakness which apparently is consistent with prior. Skin:  Skin is warm, dry and intact. No rash noted. Psychiatric: Mood and affect are normal. Speech and behavior are normal. Patient exhibits appropriate insight and judgment.   ____________________________________________  LABS (pertinent positives/negatives) I, Lisa Roca, MD the attending physician have reviewed the labs noted below.  Labs Reviewed  COMPREHENSIVE METABOLIC PANEL - Abnormal; Notable for the following components:      Result Value   Potassium 3.3 (*)    CO2 20 (*)    Glucose, Bld 117 (*)    Total Protein 8.2 (*)    All other components within normal limits  URINALYSIS, COMPLETE (UACMP) WITH MICROSCOPIC - Abnormal; Notable for the following components:   Color, Urine YELLOW (*)    APPearance CLEAR (*)    Protein, ur 100 (*)    Squamous Epithelial / LPF 0-5 (*)    All other components within normal limits  CBC WITH DIFFERENTIAL/PLATELET    ____________________________________________    EKG I, Lisa Roca, MD, the attending physician have personally viewed and interpreted all ECGs.  94 bpm.  Normal sinus rhythm.  Narrow QRS.  Normal axis.  Normal ST and T wave ____________________________________________  RADIOLOGY All Xrays were viewed by me.  Imaging interpreted by Radiologist, and I, Lisa Roca, MD the attending physician have reviewed the radiologist interpretation noted below.  CT without contrast:    CLINICAL DATA: Altered mental status.  EXAM: CT HEAD WITHOUT CONTRAST  TECHNIQUE: Contiguous axial images were obtained from the base of the skull through the vertex without intravenous contrast.  COMPARISON: CT head dated May 19, 2017.  FINDINGS: Brain: No evidence of acute infarction, hemorrhage, hydrocephalus, extra-axial collection or mass  lesion/mass effect. Old right MCA territory infarct, unchanged.  Vascular: Atherosclerotic vascular calcification of the carotid siphons. No hyperdense vessel.  Skull: Normal. Negative for fracture or focal lesion.  Sinuses/Orbits: No acute finding. Unchanged mucous retention cysts in the left anterior ethmoid air cells.  Other: None.  IMPRESSION: 1. No acute intracranial abnormality. Unchanged old right MCA  territory infarct.      __________________________________________  PROCEDURES  Procedure(s) performed: None  Critical Care performed: None   ____________________________________________  ED COURSE / ASSESSMENT AND PLAN  Pertinent labs & imaging results that were available during my care of the patient were reviewed by me and considered in my medical decision making (see chart for details).    Patient alert and oriented x3 here.  Seems to be a fairly good historian.  He says he is not sure why he was shaking earlier, but he does recall that his arms and legs were shaking and that he was conscious throughout the whole episode.  The history per the nursing home report was a little confused or difficult to understand in terms of whether or not there was true seizure activity or not.  I reviewed the patient's evaluation just from a few weeks ago where he had shaking episode and had negative MRI for new finding, negative EEG, and was felt to have non-seizure activity as a result of the shaking episode was not started on seizure medications at that point time.  Medical evaluation here is overall reassuring.  No new focal neurologic deficits.  Head CT is overall reassuring.  His laboratory studies are not showing a new urinary tract infection, and his blood work is all reassuring.  I discussed this with Dr. Doy Mince, neurology, and she recommended not initiating antiseizure medication at this point time, as it is still unclear that this was actually truly seizure  activity.  DIFFERENTIAL DIAGNOSIS: Including but not limited to seizure activity, hypoglycemia, electrolyte disturbance, chills or rigors, seizure-like activity, dehydration, stroke, etc.  CONSULTATIONS:   Discussed with Dr. Doy Mince, given patient was fully conscious through the event, this does not seem like a clear seizure, and risk of putting patient starting on new antiseizure medication seems not warranted at this point time.  She is recommending against starting him on seizure medication.  Will recommend patient follow-up with primary care doctor as well as neurologist.   Patient / Family / Caregiver informed of clinical course, medical decision-making process, and agree with plan.   I discussed return precautions, follow-up instructions, and discharge instructions with patient and/or family.  Discharge Instructions : You are evaluated for shaking episode, and although no certain cause was found, your exam and evaluation are overall reassuring in the emergency department today.  It is not suspected that this episode was due to a true seizure.  It is recommended that he follow with your primary care doctor, and with a neurologist.   Return to the emergency room immediately for any altered mental status, tongue biting, loss of continence, that accompanies any movement or shaking activities.  Return for any fevers or any other condition concerning to you.    ___________________________________________   FINAL CLINICAL IMPRESSION(S) / ED DIAGNOSES   Final diagnoses:  Episode of shaking  Seizure-like activity ([place])      ___________________________________________        Note: This dictation was prepared with Dragon dictation. Any transcriptional errors that result from this process are unintentional    Lisa Roca, MD 05/27/17 1201

## 2017-05-27 NOTE — ED Notes (Signed)
Pt attempting to obtain urine specimen 

## 2017-05-27 NOTE — ED Notes (Signed)
Patient transported to CT 

## 2017-08-14 ENCOUNTER — Emergency Department
Admission: EM | Admit: 2017-08-14 | Discharge: 2017-08-14 | Disposition: A | Discharge status: Home / Self Care | Payer: Medicaid Other | Attending: Emergency Medicine | Admitting: Emergency Medicine

## 2017-08-14 ENCOUNTER — Emergency Department: Payer: Medicaid Other

## 2017-08-14 ENCOUNTER — Encounter: Payer: Self-pay | Admitting: Emergency Medicine

## 2017-08-14 ENCOUNTER — Other Ambulatory Visit: Payer: Self-pay

## 2017-08-14 DIAGNOSIS — J449 Chronic obstructive pulmonary disease, unspecified: Secondary | ICD-10-CM | POA: Diagnosis not present

## 2017-08-14 DIAGNOSIS — Z7984 Long term (current) use of oral hypoglycemic drugs: Secondary | ICD-10-CM | POA: Insufficient documentation

## 2017-08-14 DIAGNOSIS — I10 Essential (primary) hypertension: Secondary | ICD-10-CM | POA: Diagnosis not present

## 2017-08-14 DIAGNOSIS — Z7982 Long term (current) use of aspirin: Secondary | ICD-10-CM | POA: Insufficient documentation

## 2017-08-14 DIAGNOSIS — E119 Type 2 diabetes mellitus without complications: Secondary | ICD-10-CM | POA: Insufficient documentation

## 2017-08-14 DIAGNOSIS — R569 Unspecified convulsions: Secondary | ICD-10-CM | POA: Insufficient documentation

## 2017-08-14 DIAGNOSIS — Z794 Long term (current) use of insulin: Secondary | ICD-10-CM | POA: Insufficient documentation

## 2017-08-14 DIAGNOSIS — F17210 Nicotine dependence, cigarettes, uncomplicated: Secondary | ICD-10-CM | POA: Insufficient documentation

## 2017-08-14 DIAGNOSIS — Z7902 Long term (current) use of antithrombotics/antiplatelets: Secondary | ICD-10-CM | POA: Diagnosis not present

## 2017-08-14 HISTORY — DX: Cerebral infarction, unspecified: I63.9

## 2017-08-14 LAB — COMPREHENSIVE METABOLIC PANEL
ALK PHOS: 80 U/L (ref 38–126)
ALT: 27 U/L (ref 17–63)
ANION GAP: 12 (ref 5–15)
AST: 36 U/L (ref 15–41)
Albumin: 4 g/dL (ref 3.5–5.0)
BUN: 13 mg/dL (ref 6–20)
CALCIUM: 9 mg/dL (ref 8.9–10.3)
CO2: 22 mmol/L (ref 22–32)
Chloride: 105 mmol/L (ref 101–111)
Creatinine, Ser: 0.91 mg/dL (ref 0.61–1.24)
GFR calc Af Amer: >60 mL/min (ref >60)
GFR calc non Af Amer: >60 mL/min (ref >60)
Glucose, Bld: 107 mg/dL — ABNORMAL HIGH (ref 65–99)
Potassium: 3.5 mmol/L (ref 3.5–5.1)
SODIUM: 139 mmol/L (ref 135–145)
Total Bilirubin: 0.8 mg/dL (ref 0.3–1.2)
Total Protein: 7.7 g/dL (ref 6.5–8.1)

## 2017-08-14 LAB — CBC WITH DIFFERENTIAL/PLATELET
Basophils Absolute: 0.1 10*3/uL (ref 0–0.1)
Basophils Relative: 1 %
EOS ABS: 0.3 10*3/uL (ref 0–0.7)
EOS PCT: 3 %
HCT: 47.1 % (ref 40.0–52.0)
HEMOGLOBIN: 16.1 g/dL (ref 13.0–18.0)
LYMPHS ABS: 2.1 10*3/uL (ref 1.0–3.6)
Lymphocytes Relative: 26 %
MCH: 32.9 pg (ref 26.0–34.0)
MCHC: 34.1 g/dL (ref 32.0–36.0)
MCV: 96.6 fL (ref 80.0–100.0)
MONOS PCT: 7 %
Monocytes Absolute: 0.6 10*3/uL (ref 0.2–1.0)
Neutro Abs: 5.1 10*3/uL (ref 1.4–6.5)
Neutrophils Relative %: 63 %
PLATELETS: 206 10*3/uL (ref 150–440)
RBC: 4.88 MIL/uL (ref 4.40–5.90)
RDW: 14.2 % (ref 11.5–14.5)
WBC: 8.2 10*3/uL (ref 3.8–10.6)

## 2017-08-14 LAB — URINALYSIS, ROUTINE W REFLEX MICROSCOPIC
Bacteria, UA: NONE SEEN
Bilirubin Urine: NEGATIVE
Glucose, UA: NEGATIVE mg/dL
Ketones, ur: NEGATIVE mg/dL
Leukocytes, UA: NEGATIVE
NITRITE: NEGATIVE
PROTEIN: 100 mg/dL — AB
SPECIFIC GRAVITY, URINE: 1.016 (ref 1.005–1.030)
pH: 5 (ref 5.0–8.0)

## 2017-08-14 LAB — TROPONIN I: Troponin I: <0.03 ng/mL (ref <0.03)

## 2017-08-14 MED ORDER — LEVETIRACETAM 500 MG PO TABS: 500.0000 mg | ORAL_TABLET | Freq: Two times a day (BID) | ORAL | 1 refills | Status: DC

## 2017-08-14 MED ORDER — LEVETIRACETAM 500 MG PO TABS
1000.0000 mg | ORAL_TABLET | Freq: Once | ORAL | Status: AC
  Administered 2017-08-14: 1000 mg via ORAL
  Filled 2017-08-14: qty 2

## 2017-08-14 NOTE — ED Provider Notes (Signed)
[facility] Emergency Department Provider Note  Time seen: 3:00 PM  I have reviewed the triage vital signs and the nursing notes.   HISTORY  Chief Complaint No chief complaint on file.    HPI Chad Combs is a 62 y.o. male with a past medical history of COPD, diabetes, hypertension, 2 prior seizure-like episodes, prior CVA with left hemiparesis presents to the emergency department for possible seizure.  According to report patient is coming from a nursing facility, had a possible seizure at his facility.  Patient was confused following the seizure, now is alert oriented x4 with no complaints.  Denies any chest pain, shortness of breath, nausea vomiting.  Does state one episode of diarrhea today.  Largely negative review of systems otherwise.  Currently the patient appears well, no distress.  The left facial droop and left-sided weakness he states is chronic and not new.  Patient does not take any seizure medications but states he has had 2 seizure-like episodes over the past 1 year.   Past Medical History:  Diagnosis Date  . Blind right eye   . COPD (chronic obstructive pulmonary disease) ([place])   . Diabetes mellitus without complication ([place])   . Hypertension     Patient Active Problem List   Diagnosis Date Noted  . UTI (urinary tract infection) 05/19/2017  . Seizure-like activity ([place]) 05/19/2017  . Hyperlipidemia 11/08/2016  . Type II diabetes mellitus with neurological manifestations, uncontrolled ([place]) 10/04/2016  . Dyslipidemia associated with type 2 diabetes mellitus ([place]) 10/04/2016  . AAA (abdominal aortic aneurysm) without rupture ([place])   . Acute deep vein thrombosis (DVT) of femoral vein of left lower extremity ([place])   . Pulmonary embolus ([place])   . Adjustment disorder   . 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 Left hemiplegia ([place])   . Dysarthria, post-stroke   . Dysphagia, post-stroke   . Carotid stenosis   . Benign essential HTN   .  Leukocytosis   . Polycythemia vera ([place])   . Tobacco abuse   . Chronic obstructive pulmonary disease ([place])   . 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    Past Surgical History:  Procedure Laterality Date  . IVC FILTER INSERTION N/A 08/24/2016   Procedure: IVC Filter Insertion;  Surgeon: Katha Cabal, MD;  Location: [facility];  Service: Cardiovascular;  Laterality: N/A;    Prior to Admission medications   Medication Sig Start Date End Date Taking? Authorizing Provider  acetaminophen (TYLENOL) 500 MG tablet Take 500 mg by mouth at bedtime as needed.     [provider]  aspirin 81 MG chewable tablet Chew 81 mg by mouth daily.    [provider]  bisacodyl (BISACODYL) 5 MG EC tablet Take 10 mg by mouth daily as needed for moderate constipation.    [provider]  budesonide-formoterol (SYMBICORT) 80-4.5 MCG/ACT inhaler Inhale 2 puffs into the lungs every 12 (twelve) hours.    [provider]  clopidogrel (PLAVIX) 75 MG tablet Take 75 mg by mouth daily.    [provider]  diclofenac sodium (VOLTAREN) 1 % GEL Apply 2-4 g topically 2 (two) times daily. APPLY 2MG  TO LEFT SHOULDER TWICE DAILY AND 4 GRAMS TO LEFT HIP AT BEDTIME    [provider]  ezetimibe (ZETIA) 10 MG tablet Take 1 tablet (10 mg total) by mouth daily at 6 PM. Patient taking differently: Take 10 mg by mouth daily.  10/01/16   Bary Leriche, PA-C  famotidine (PEPCID) 40 MG tablet Take 1 tablet (40 mg total) by mouth 2 (two) times daily. 10/01/16   Love, Ivan Anchors, PA-C  fluticasone (FLONASE) 50 MCG/ACT nasal spray Place 1 spray into both nostrils daily. 08/25/16   Max Sane, MD  HYDROcodone-acetaminophen (NORCO/VICODIN) 5-325 MG tablet Take 1 tablet by mouth daily as needed for moderate pain. 05/22/17   Gladstone Lighter, MD  insulin aspart (NOVOLOG) 100 UNIT/ML injection Inject 0-9 Units into the skin 3 (three) times daily. SLIDING SCALE: 0-99 (0u), 100-149 (2u), 150-199 (3u),  200-249 (4u), 250-299 (5u), 300-349 (6u), 350-399 (7u), 400-449 (8u), 450-499 (9u) - IF 500+ CONTACT PHYSICIAN    [provider]  insulin glargine (LANTUS) 100 UNIT/ML injection Inject 0.1 mLs (10 Units total) into the skin at bedtime. 05/22/17   Gladstone Lighter, MD  lisinopril (PRINIVIL,ZESTRIL) 20 MG tablet Take 20 mg by mouth daily.    [provider]  Melatonin 5 MG TABS Take 1 tablet by mouth at bedtime as needed.    [provider]  metFORMIN (GLUCOPHAGE) 1000 MG tablet Take 1,000 mg by mouth 2 (two) times daily with a meal.    [provider]  metoprolol tartrate (LOPRESSOR) 25 MG tablet Take 0.5 tablets (12.5 mg total) by mouth 2 (two) times daily. 10/06/16   Hendricks Limes, MD  polyethylene glycol [facility] / Floria Raveling) packet Take 17 g by mouth daily. MIX 17 GRAMS IN 4 TO 8 OUNCES WATER AND GIVE EVERY MORNING    [provider]  senna (SENOKOT) 8.6 MG TABS tablet Take 1 tablet by mouth 2 (two) times daily.    [provider]  sertraline (ZOLOFT) 50 MG tablet Take 50 mg by mouth daily.    [provider]  tamsulosin (FLOMAX) 0.4 MG CAPS capsule Take 1 capsule (0.4 mg total) by mouth daily after supper. 10/01/16   Love, Ivan Anchors, PA-C  tiZANidine (ZANAFLEX) 2 MG tablet Take 1 tablet (2 mg total) by mouth at bedtime. Patient taking differently: Take 2-4 mg by mouth 4 (four) times daily. TAKE 4MG  BY MOUTH THREE TIMES DAILY AND 2MG  BY MOUTH AT BEDTIME 10/01/16   Love, Ivan Anchors, PA-C    No Known Allergies  Family History  Problem Relation Age of Onset  . Cancer Mother   . Heart disease Father     Social History Social History   Tobacco Use  . Smoking status: Current Every Day Smoker    Packs/day: 1.50    Years: 45.00    Pack years: 67.50    Types: Cigarettes  . Smokeless tobacco: Never Used  Substance Use Topics  . Alcohol use: Yes    Alcohol/week: 0.0 oz    Comment: occasional (once every couple months)  . Drug  use: No    Review of Systems Constitutional: Negative for fever. Eyes: Negative for visual complaints ENT: Mild congestion, states recent head cold. Cardiovascular: Negative for chest pain. Respiratory: Negative for shortness of breath. Gastrointestinal: Negative for abdominal pain, vomiting and one episode of diarrhea today Genitourinary: Negative for urinary compaints Musculoskeletal: Sided weakness which is baseline. Skin: Negative for skin complaints  Neurological: Negative for headache.  Positive for left-sided weakness which is baseline. All other ROS negative  ____________________________________________   PHYSICAL EXAM:  Constitutional: Alert and oriented. Well appearing and in no distress. Eyes: Normal exam ENT   Head: Normocephalic and atraumatic.   Nose: No congestion/rhinnorhea.   Mouth/Throat: Mucous membranes are moist. Cardiovascular: Normal rate, regular rhythm.  Respiratory: Normal  respiratory effort without tachypnea nor retractions. Breath sounds are clear  Gastrointestinal: Soft and nontender. No distention.   Musculoskeletal: Nontender with normal range of motion in all extremities.  Neurologic:  Normal speech and language.  Moderate left facial droop which she states is chronic.  Left arm paralysis and left leg paralysis which is chronic per patient.  Good strength in the right upper and right lower extremity. Skin:  Skin is warm, dry and intact.  Psychiatric: Mood and affect are normal.   ____________________________________________    EKG  EKG reviewed and interpreted by myself shows normal sinus rhythm 89 bpm with a narrow QRS, normal axis, normal intervals, no concerning ST changes.  Overall reassuring EKG.  ____________________________________________    RADIOLOGY  CT head shows remote right MCA infarct but no acute abnormality.  ____________________________________________   INITIAL IMPRESSION / ASSESSMENT AND PLAN / ED  COURSE  Pertinent labs & imaging results that were available during my care of the patient were reviewed by me and considered in my medical decision making (see chart for details).  Patient presents to the emergency department for altered mental status, confusion following a seizure-like episode.  Differential would include postictal state following a seizure, syncopal episode, seizure-like activity, seizure due to prior CVA, new CVA, ICH.  We will check labs, CT scan of the head, EKG and closely monitor in the emergency department.  Overall the patient appears very well currently, no distress, no complaints.  Alert and oriented x4.  Given the patient's history of CVA with left hemiparesis I suspect the patient could have developed a seizure disorder as this is now the patient's third seizure-like episode over the past 1 year.  We will discussed with neurology for medication recommendations once workup is complete if no other acute findings are found.  Patient's workup thus far is largely nonrevealing, urinalysis pending.  CT shows old infarct but no acute abnormality.  Labs including troponin are negative/normal.  I discussed patient with Dr. Doy Mince of neurology given his third episode in the past 1 year she recommends starting patient on Keppra.  We will loaded with 1000 mg orally in the emergency department charge of 500 mg twice daily for seizure prevention.  Currently awaiting urinalysis prior to discharge.  Patient's urinalysis has resulted largely within normal limits.  We will discharge with Keppra 500 twice daily the patient follow-up with neurology as an outpatient.  Patient agreeable to this plan of care.  ____________________________________________   FINAL CLINICAL IMPRESSION(S) / ED DIAGNOSES  Seizure    Harvest Dark, MD 08/14/17 1751

## 2017-08-14 NOTE — Discharge Instructions (Signed)
Please take your medication as prescribed.  Please follow-up with neurology by calling the number provided to arrange a follow-up appointment as soon as possible.  Return to the emergency department for any further seizure activity, or any other symptom personally concerning to yourself.

## 2017-11-03 IMAGING — CT CT ANGIO CHEST-ABD-PELV FOR DISSECTION W/ AND WO/W CM
2 of 11 series · 11 of 40 positions shown, 15 images · IV contrast (OMNI 350)
Comparison: Ultrasound 09/09/2016

CLINICAL DATA: Infrarenal abdominal aortic aneurysm on screening
study .

Hx of:IVC FilterHTN DMCOPD
.
EXAM:
CT ANGIOGRAPHY CHEST, ABDOMEN AND PELVIS
TECHNIQUE: Multidetector CT imaging through the chest, abdomen and pelvis was
performed using the standard protocol during bolus administration of
intravenous contrast. Multiplanar reconstructed images and MIPs were
obtained and reviewed to evaluate the vascular anatomy.
CONTRAST:  100 mL Isovue 370 IV

[Series 7: dissection 2mm · axial · 0.95mm/px · z∈[+886,+1494]mm · 10 of 370 slices shown, 13 images]
[im 44/370  mediastinal]
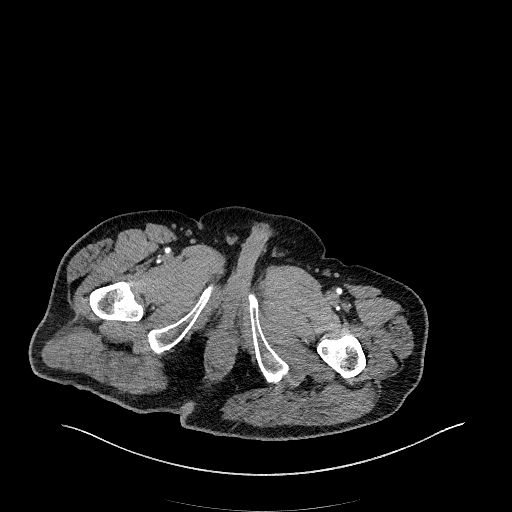
[im 44/370  bone]
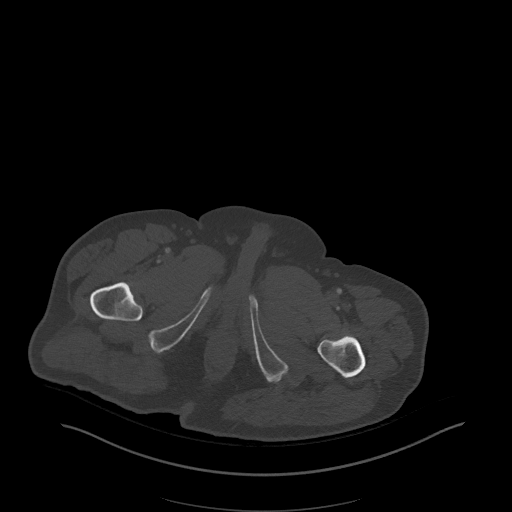
[im 87/370  mediastinal]
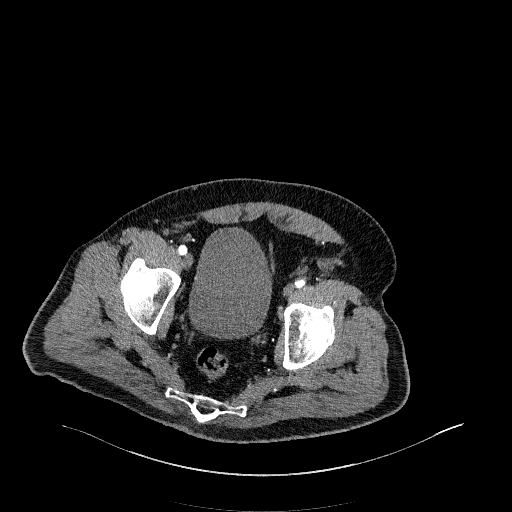
[im 131/370  mediastinal]
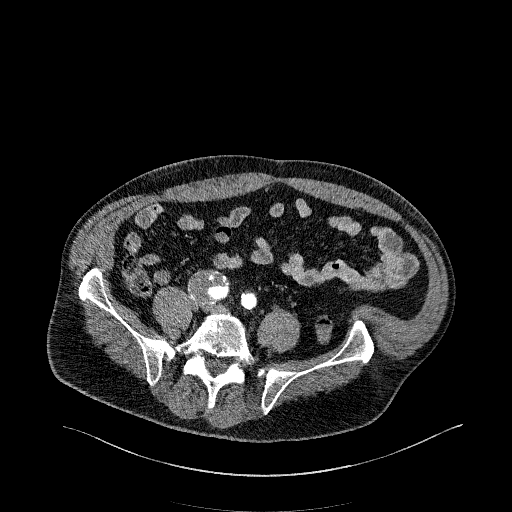
[im 174/370  mediastinal]
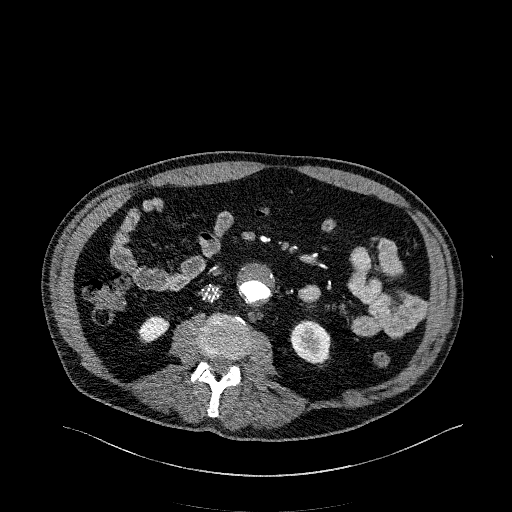
[im 218/370  mediastinal]
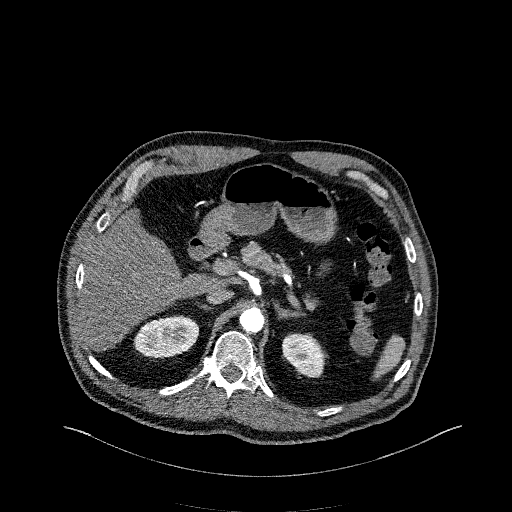
[im 261/370  mediastinal]
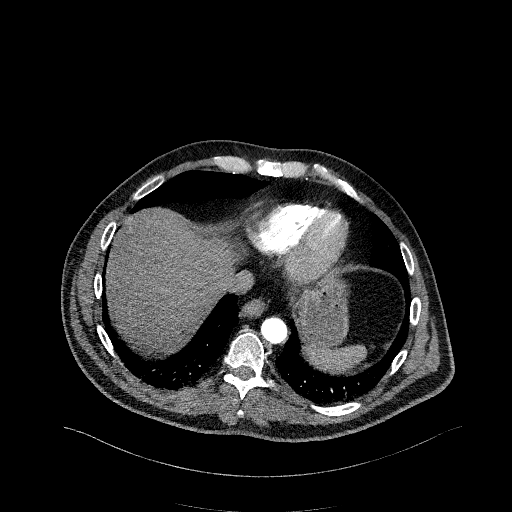
[im 283/370  lung]
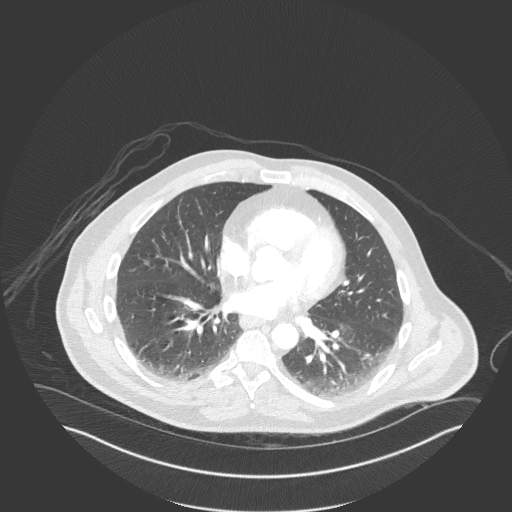
[im 304/370  mediastinal]
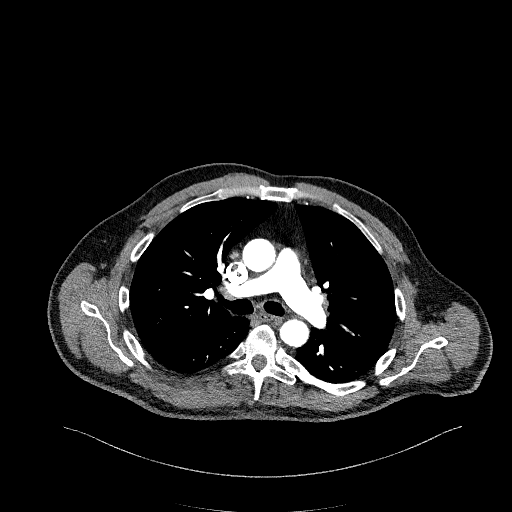
[im 304/370  lung]
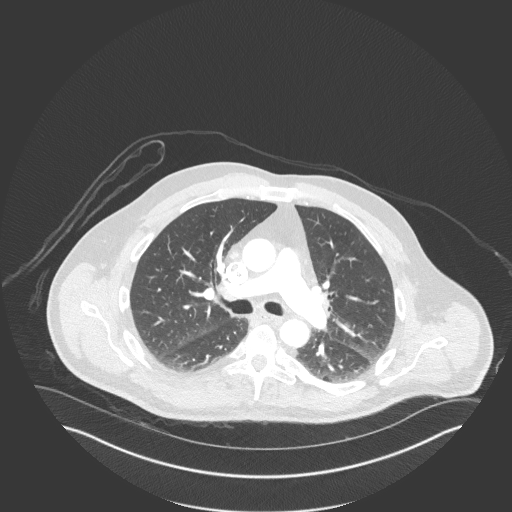
[im 326/370  lung]
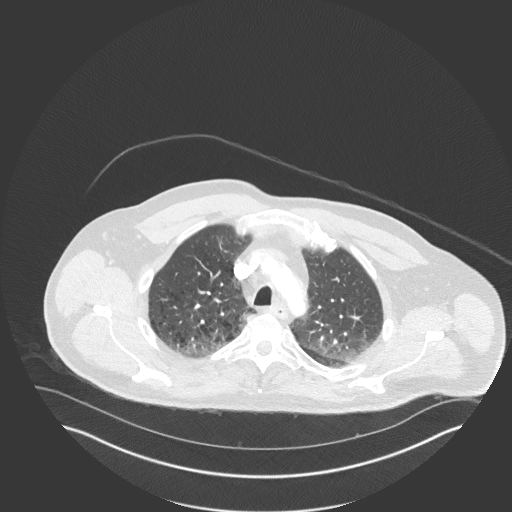
[im 348/370  mediastinal]
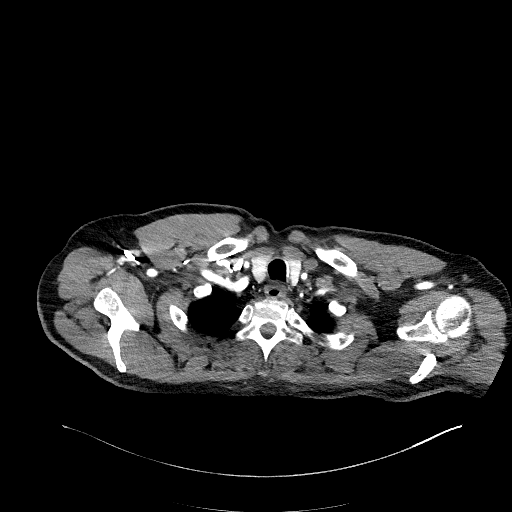
[im 348/370  lung]
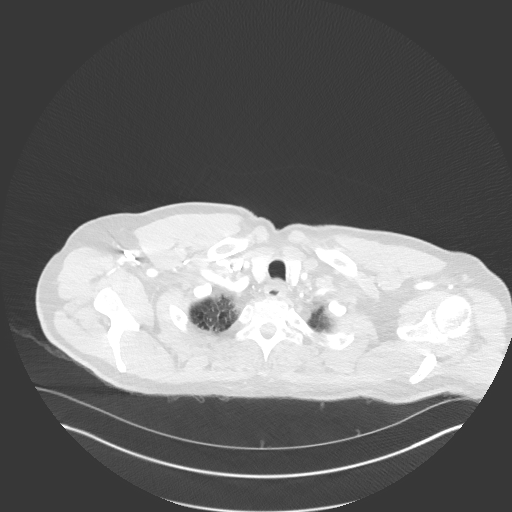

[Series 17: a/p coronal · coronal · 0.82mm/px · 1 of 167 slices shown, 2 images]
[im 84/167  mediastinal]
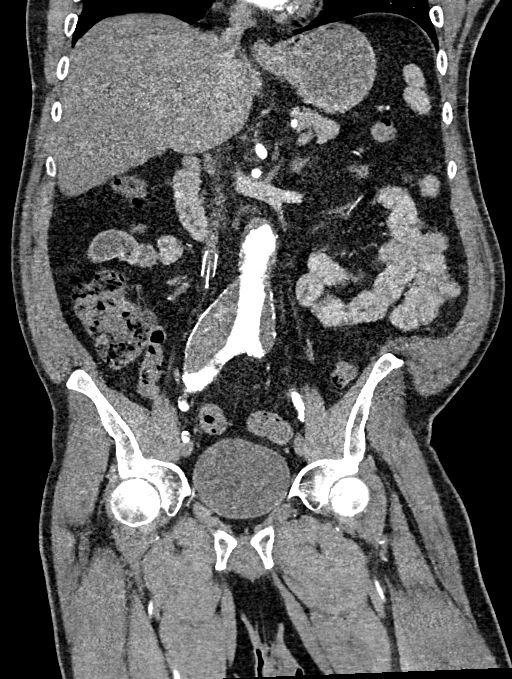
[im 84/167  bone]
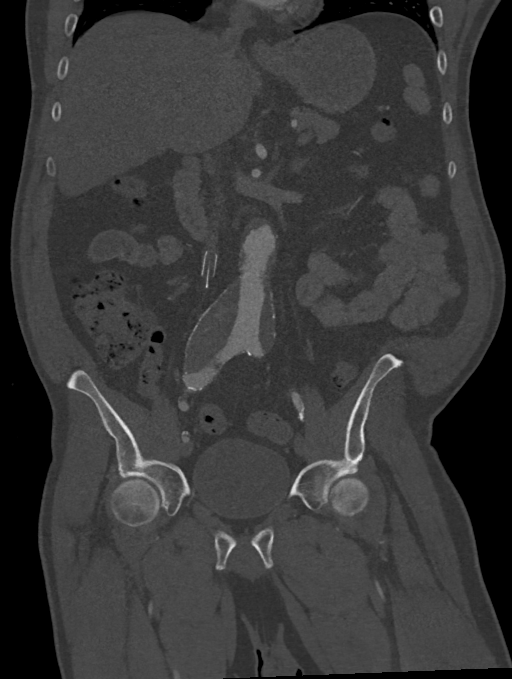

[11 of 40 positions shown; findings below may reference images not displayed]

FINDINGS: CTA CHEST FINDINGS

Cardiovascular: There is good contrast opacification of pulmonary
artery branches. There is a single segmental filling defect in the
medial basal segment right lower lobe branch of the pulmonary artery
image 88/7 consistent with acute/ subacute PE. There is no
dilatation of the right ventricle. Scattered coronary
calcifications. There is good contrast opacification of the thoracic
aorta with no evidence of dissection or stenosis. Classic 3 vessel
brachiocephalic arterial origin anatomy without proximal stenosis.
There is a penetrating atheromatous ulcer in the distal aortic arch
just beyond the left subclavian origin associated with a saccular
3.1 cm aneurysmal dilatation, containing some irregular mural
thrombus. The distal descending thoracic segment is unremarkable.

Mediastinum/Nodes: No enlarged mediastinal, hilar, or axillary lymph
nodes. Thyroid gland, trachea, and esophagus demonstrate no
significant findings.

Lungs/Pleura: Emphysematous changes most marked in the apices. No
airspace infiltrate. No pleural effusion. No pneumothorax.

Musculoskeletal: No chest wall abnormality. No acute or significant
osseous findings. Old fracture deformity of the left eleventh rib.

Review of the MIP images confirms the above findings.

CTA ABDOMEN AND PELVIS FINDINGS

VASCULAR

Aorta: Scattered atheromatous plaque in the suprarenal segment with
mild mural thrombus, no dissection or stenosis. Fusiform aneurysm of
the infrarenal aorta extending across the bifurcation measuring
cm transverse diameter the just above the bifurcation. There is a
large amount of mural thrombus within the aneurysm. No
retroperitoneal hematoma.

Celiac: Proximal short-segment stenosis of the mid level of the
median arcuate ligament of the diaphragm, patent distally.

SMA: Patent without evidence of aneurysm, dissection, vasculitis or
significant stenosis.

Renals: Both renal arteries are patent without evidence of aneurysm,
dissection, vasculitis, fibromuscular dysplasia or significant
stenosis.

IMA: Short segment origin stenosis, reconstituted distally by
visceral collaterals.

Inflow: Contiguous extension of the aortic aneurysm into the common
iliac artery is, extending only a short distance on the left in
measure in 3.2 cm diameter, and extending nearly to the bifurcation
of the right common iliac artery, measuring up to 4.2 cm diameter,
tapering to a diameter of 19 mm just above the bifurcation.
Calcified eccentric plaque through bilateral external iliac artery
is resulting in tandem areas of mild stenosis. Visualized proximal
arterial outflow unremarkable bilaterally.

Veins: Dedicated venous phase imaging not obtained. Infrarenal IVC
filter is in expected location.

Review of the MIP images confirms the above findings.

NON-VASCULAR

Hepatobiliary: No focal liver abnormality is seen. No gallstones,
gallbladder wall thickening, or biliary dilatation.

Pancreas: Unremarkable. No pancreatic ductal dilatation or
surrounding inflammatory changes.

Spleen: Normal in size without focal abnormality.

Adrenals/Urinary Tract: Adrenal glands are unremarkable. Kidneys are
normal, without renal calculi, focal lesion, or hydronephrosis.
Urinary bladder is physiologically distended There is curvilinear
layering high attenuation material along the left posterolateral
aspect of the urinary bladder wall.

Stomach/Bowel: Stomach mildly distended by ingested material. Small
bowel nondilated. Appendix not identified. The colon is nondilated,
unremarkable.

Lymphatic: No abdominal or pelvic adenopathy localized.

Reproductive: Prostatic enlargement protruding into the lumen of the
urinary bladder.

Other: No ascites.  No free air.

Musculoskeletal: Degenerative disc disease L5-S1. Negative for
fracture or worrisome bone lesion.

Review of the MIP images confirms the above findings.
IMPRESSION: 1. Solitary acute PE in the medial basal segment right lower lobe
pulmonary artery branch.
2. 3.1 cm saccular aneurysm of the distal aortic arch with
penetrating atheromatous ulcer.
3. Fusiform 6.8 cm infrarenal abdominal aortic aneurysm extending
across the bifurcation into 4.2 cm right and short segment 3.2 cm
left common iliac artery aneurysms.
4. Infrarenal retrievable IVC filter.
5. Curvilinear calcifications along the left posterolateral urinary
bladder wall may represent small layering calculi versus partially
calcified bladder wall lesion.

## 2018-02-17 ENCOUNTER — Observation Stay (HOSPITAL_BASED_OUTPATIENT_CLINIC_OR_DEPARTMENT_OTHER)
Admit: 2018-02-17 | Discharge: 2018-02-17 | Disposition: A | Discharge status: Home / Self Care | Payer: Medicaid Other | Attending: Cardiovascular Disease | Admitting: Cardiovascular Disease

## 2018-02-17 ENCOUNTER — Emergency Department: Payer: Medicaid Other

## 2018-02-17 ENCOUNTER — Other Ambulatory Visit: Payer: Self-pay

## 2018-02-17 ENCOUNTER — Observation Stay
Admission: EM | Admit: 2018-02-17 | Discharge: 2018-02-18 | Disposition: A | Discharge status: Skilled Nursing Facility | Payer: Medicaid Other | Attending: Internal Medicine | Admitting: Internal Medicine

## 2018-02-17 DIAGNOSIS — I639 Cerebral infarction, unspecified: Secondary | ICD-10-CM | POA: Diagnosis not present

## 2018-02-17 DIAGNOSIS — E1151 Type 2 diabetes mellitus with diabetic peripheral angiopathy without gangrene: Secondary | ICD-10-CM | POA: Diagnosis not present

## 2018-02-17 DIAGNOSIS — I723 Aneurysm of iliac artery: Secondary | ICD-10-CM | POA: Diagnosis not present

## 2018-02-17 DIAGNOSIS — Z86718 Personal history of other venous thrombosis and embolism: Secondary | ICD-10-CM | POA: Diagnosis not present

## 2018-02-17 DIAGNOSIS — Z7901 Long term (current) use of anticoagulants: Secondary | ICD-10-CM | POA: Insufficient documentation

## 2018-02-17 DIAGNOSIS — Z95828 Presence of other vascular implants and grafts: Secondary | ICD-10-CM | POA: Insufficient documentation

## 2018-02-17 DIAGNOSIS — R079 Chest pain, unspecified: Secondary | ICD-10-CM | POA: Diagnosis present

## 2018-02-17 DIAGNOSIS — Z8249 Family history of ischemic heart disease and other diseases of the circulatory system: Secondary | ICD-10-CM | POA: Diagnosis not present

## 2018-02-17 DIAGNOSIS — I69354 Hemiplegia and hemiparesis following cerebral infarction affecting left non-dominant side: Secondary | ICD-10-CM | POA: Diagnosis not present

## 2018-02-17 DIAGNOSIS — Z7982 Long term (current) use of aspirin: Secondary | ICD-10-CM | POA: Diagnosis not present

## 2018-02-17 DIAGNOSIS — F17200 Nicotine dependence, unspecified, uncomplicated: Secondary | ICD-10-CM

## 2018-02-17 DIAGNOSIS — H5461 Unqualified visual loss, right eye, normal vision left eye: Secondary | ICD-10-CM | POA: Diagnosis not present

## 2018-02-17 DIAGNOSIS — F17210 Nicotine dependence, cigarettes, uncomplicated: Secondary | ICD-10-CM | POA: Diagnosis not present

## 2018-02-17 DIAGNOSIS — E1149 Type 2 diabetes mellitus with other diabetic neurological complication: Secondary | ICD-10-CM | POA: Insufficient documentation

## 2018-02-17 DIAGNOSIS — Z86711 Personal history of pulmonary embolism: Secondary | ICD-10-CM | POA: Insufficient documentation

## 2018-02-17 DIAGNOSIS — I10 Essential (primary) hypertension: Secondary | ICD-10-CM | POA: Diagnosis not present

## 2018-02-17 DIAGNOSIS — I739 Peripheral vascular disease, unspecified: Secondary | ICD-10-CM | POA: Diagnosis not present

## 2018-02-17 DIAGNOSIS — R0789 Other chest pain: Principal | ICD-10-CM | POA: Insufficient documentation

## 2018-02-17 DIAGNOSIS — J449 Chronic obstructive pulmonary disease, unspecified: Secondary | ICD-10-CM | POA: Insufficient documentation

## 2018-02-17 DIAGNOSIS — Z79899 Other long term (current) drug therapy: Secondary | ICD-10-CM | POA: Diagnosis not present

## 2018-02-17 DIAGNOSIS — E785 Hyperlipidemia, unspecified: Secondary | ICD-10-CM | POA: Diagnosis not present

## 2018-02-17 DIAGNOSIS — I208 Other forms of angina pectoris: Secondary | ICD-10-CM | POA: Diagnosis not present

## 2018-02-17 DIAGNOSIS — I5030 Unspecified diastolic (congestive) heart failure: Secondary | ICD-10-CM

## 2018-02-17 DIAGNOSIS — I714 Abdominal aortic aneurysm, without rupture: Secondary | ICD-10-CM | POA: Diagnosis not present

## 2018-02-17 DIAGNOSIS — Z7401 Bed confinement status: Secondary | ICD-10-CM | POA: Diagnosis not present

## 2018-02-17 DIAGNOSIS — I6529 Occlusion and stenosis of unspecified carotid artery: Secondary | ICD-10-CM

## 2018-02-17 DIAGNOSIS — Z794 Long term (current) use of insulin: Secondary | ICD-10-CM | POA: Diagnosis not present

## 2018-02-17 DIAGNOSIS — Z7902 Long term (current) use of antithrombotics/antiplatelets: Secondary | ICD-10-CM | POA: Diagnosis not present

## 2018-02-17 HISTORY — DX: Abdominal aortic aneurysm, without rupture: I71.4

## 2018-02-17 HISTORY — DX: Abdominal aortic aneurysm, without rupture, unspecified: I71.40

## 2018-02-17 HISTORY — DX: Other ill-defined heart diseases: I51.89

## 2018-02-17 HISTORY — DX: Disorder of arteries and arterioles, unspecified: I77.9

## 2018-02-17 HISTORY — DX: Tobacco use: Z72.0

## 2018-02-17 HISTORY — DX: Acute embolism and thrombosis of unspecified femoral vein: I82.419

## 2018-02-17 HISTORY — DX: Peripheral vascular disease, unspecified: I73.9

## 2018-02-17 HISTORY — DX: Personal history of transient ischemic attack (TIA), and cerebral infarction without residual deficits: Z86.73

## 2018-02-17 HISTORY — DX: Personal history of pulmonary embolism: Z86.711

## 2018-02-17 LAB — CBC
HCT: 47.3 % (ref 40.0–52.0)
Hemoglobin: 16.5 g/dL (ref 13.0–18.0)
MCH: 34.6 pg — AB (ref 26.0–34.0)
MCHC: 34.9 g/dL (ref 32.0–36.0)
MCV: 99.1 fL (ref 80.0–100.0)
PLATELETS: 216 10*3/uL (ref 150–440)
RBC: 4.78 MIL/uL (ref 4.40–5.90)
RDW: 14.6 % — AB (ref 11.5–14.5)
WBC: 9.9 10*3/uL (ref 3.8–10.6)

## 2018-02-17 LAB — TROPONIN I
Troponin I: <0.03 ng/mL (ref <0.03)
Troponin I: <0.03 ng/mL (ref <0.03)

## 2018-02-17 LAB — HEMOGLOBIN A1C
Hgb A1c MFr Bld: 5.9 % — ABNORMAL HIGH (ref 4.8–5.6)
Mean Plasma Glucose: 122.63 mg/dL

## 2018-02-17 LAB — COMPREHENSIVE METABOLIC PANEL
ALT: 31 U/L (ref 0–44)
ANION GAP: 8 (ref 5–15)
AST: 25 U/L (ref 15–41)
Albumin: 3.9 g/dL (ref 3.5–5.0)
Alkaline Phosphatase: 65 U/L (ref 38–126)
BUN: 11 mg/dL (ref 8–23)
CALCIUM: 8.9 mg/dL (ref 8.9–10.3)
CHLORIDE: 105 mmol/L (ref 98–111)
CO2: 28 mmol/L (ref 22–32)
CREATININE: 0.87 mg/dL (ref 0.61–1.24)
GFR calc Af Amer: >60 mL/min (ref >60)
Glucose, Bld: 113 mg/dL — ABNORMAL HIGH (ref 70–99)
Potassium: 3.2 mmol/L — ABNORMAL LOW (ref 3.5–5.1)
SODIUM: 141 mmol/L (ref 135–145)
TOTAL PROTEIN: 7.5 g/dL (ref 6.5–8.1)
Total Bilirubin: 1.3 mg/dL — ABNORMAL HIGH (ref 0.3–1.2)

## 2018-02-17 LAB — LIPASE, BLOOD: LIPASE: 28 U/L (ref 11–51)

## 2018-02-17 LAB — ECHOCARDIOGRAM COMPLETE
HEIGHTINCHES: 69 in
WEIGHTICAEL: 2907.2 [oz_av]

## 2018-02-17 LAB — MRSA PCR SCREENING: MRSA BY PCR: POSITIVE — AB

## 2018-02-17 LAB — GLUCOSE, CAPILLARY
GLUCOSE-CAPILLARY: 120 mg/dL — AB (ref 70–99)
Glucose-Capillary: 100 mg/dL — ABNORMAL HIGH (ref 70–99)

## 2018-02-17 MED ORDER — CHLORHEXIDINE GLUCONATE CLOTH 2 % EX PADS
6.0000 | MEDICATED_PAD | Freq: Every day | CUTANEOUS | Status: DC
  Administered 2018-02-18: 6 via TOPICAL

## 2018-02-17 MED ORDER — SERTRALINE HCL 50 MG PO TABS
50.0000 mg | ORAL_TABLET | Freq: Every day | ORAL | Status: DC
  Administered 2018-02-17 – 2018-02-18 (×2): 50 mg via ORAL
  Filled 2018-02-17 (×2): qty 1

## 2018-02-17 MED ORDER — INSULIN ASPART 100 UNIT/ML ~~LOC~~ SOLN
0.0000 [IU] | Freq: Three times a day (TID) | SUBCUTANEOUS | Status: DC
  Administered 2018-02-18: 2 [IU] via SUBCUTANEOUS
  Filled 2018-02-17: qty 1

## 2018-02-17 MED ORDER — TAMSULOSIN HCL 0.4 MG PO CAPS
0.4000 mg | ORAL_CAPSULE | Freq: Every day | ORAL | Status: DC
  Administered 2018-02-17: 0.4 mg via ORAL
  Filled 2018-02-17: qty 1

## 2018-02-17 MED ORDER — ACETAMINOPHEN 325 MG PO TABS: 650.0000 mg | ORAL_TABLET | ORAL | Status: DC | PRN

## 2018-02-17 MED ORDER — MELATONIN 5 MG PO TABS
1.0000 | ORAL_TABLET | Freq: Every evening | ORAL | Status: DC | PRN
  Filled 2018-02-17: qty 1

## 2018-02-17 MED ORDER — AMLODIPINE BESYLATE 5 MG PO TABS
5.0000 mg | ORAL_TABLET | Freq: Every day | ORAL | Status: DC
  Administered 2018-02-17 – 2018-02-18 (×2): 5 mg via ORAL
  Filled 2018-02-17 (×2): qty 1

## 2018-02-17 MED ORDER — INSULIN GLARGINE 100 UNIT/ML ~~LOC~~ SOLN
10.0000 [IU] | Freq: Every day | SUBCUTANEOUS | Status: DC
  Administered 2018-02-17: 10 [IU] via SUBCUTANEOUS
  Filled 2018-02-17 (×2): qty 0.1

## 2018-02-17 MED ORDER — INSULIN ASPART 100 UNIT/ML ~~LOC~~ SOLN: 0.0000 [IU] | Freq: Every day | SUBCUTANEOUS | Status: DC

## 2018-02-17 MED ORDER — ONDANSETRON HCL 4 MG/2ML IJ SOLN: 4.0000 mg | Freq: Four times a day (QID) | INTRAMUSCULAR | Status: DC | PRN

## 2018-02-17 MED ORDER — SENNA 8.6 MG PO TABS
1.0000 | ORAL_TABLET | Freq: Two times a day (BID) | ORAL | Status: DC
  Administered 2018-02-17 – 2018-02-18 (×3): 8.6 mg via ORAL
  Filled 2018-02-17 (×3): qty 1

## 2018-02-17 MED ORDER — MORPHINE SULFATE (PF) 2 MG/ML IV SOLN: 2.0000 mg | INTRAVENOUS | Status: DC | PRN

## 2018-02-17 MED ORDER — DICLOFENAC SODIUM 1 % TD GEL
4.0000 g | Freq: Every day | TRANSDERMAL | Status: DC
  Filled 2018-02-17 (×2): qty 100

## 2018-02-17 MED ORDER — BISACODYL 5 MG PO TBEC: 10.0000 mg | DELAYED_RELEASE_TABLET | Freq: Every day | ORAL | Status: DC | PRN

## 2018-02-17 MED ORDER — METOPROLOL TARTRATE 25 MG PO TABS
12.5000 mg | ORAL_TABLET | Freq: Two times a day (BID) | ORAL | Status: DC
  Administered 2018-02-17 – 2018-02-18 (×3): 12.5 mg via ORAL
  Filled 2018-02-17 (×3): qty 1

## 2018-02-17 MED ORDER — MOMETASONE FURO-FORMOTEROL FUM 100-5 MCG/ACT IN AERO
2.0000 | INHALATION_SPRAY | Freq: Two times a day (BID) | RESPIRATORY_TRACT | Status: DC
  Administered 2018-02-17 (×2): 2 via RESPIRATORY_TRACT
  Filled 2018-02-17: qty 8.8

## 2018-02-17 MED ORDER — EZETIMIBE 10 MG PO TABS
10.0000 mg | ORAL_TABLET | Freq: Every day | ORAL | Status: DC
  Administered 2018-02-17 – 2018-02-18 (×2): 10 mg via ORAL
  Filled 2018-02-17 (×2): qty 1

## 2018-02-17 MED ORDER — LEVETIRACETAM 500 MG PO TABS
500.0000 mg | ORAL_TABLET | Freq: Two times a day (BID) | ORAL | Status: DC
  Administered 2018-02-17 – 2018-02-18 (×3): 500 mg via ORAL
  Filled 2018-02-17 (×3): qty 1

## 2018-02-17 MED ORDER — GI COCKTAIL ~~LOC~~
30.0000 mL | Freq: Four times a day (QID) | ORAL | Status: DC | PRN
  Filled 2018-02-17: qty 30

## 2018-02-17 MED ORDER — ASPIRIN 81 MG PO CHEW
81.0000 mg | CHEWABLE_TABLET | Freq: Every day | ORAL | Status: DC
  Administered 2018-02-18: 81 mg via ORAL
  Filled 2018-02-17: qty 1

## 2018-02-17 MED ORDER — DICLOFENAC SODIUM 1 % TD GEL
2.0000 g | Freq: Two times a day (BID) | TRANSDERMAL | Status: DC
  Administered 2018-02-17: 2 g via TOPICAL
  Filled 2018-02-17: qty 100

## 2018-02-17 MED ORDER — ENOXAPARIN SODIUM 40 MG/0.4ML ~~LOC~~ SOLN
40.0000 mg | SUBCUTANEOUS | Status: DC
  Administered 2018-02-17: 40 mg via SUBCUTANEOUS
  Filled 2018-02-17: qty 0.4

## 2018-02-17 MED ORDER — FLUTICASONE PROPIONATE 50 MCG/ACT NA SUSP
1.0000 | Freq: Every day | NASAL | Status: DC
  Administered 2018-02-17 – 2018-02-18 (×2): 1 via NASAL
  Filled 2018-02-17: qty 16

## 2018-02-17 MED ORDER — MUPIROCIN 2 % EX OINT
1.0000 "application " | TOPICAL_OINTMENT | Freq: Two times a day (BID) | CUTANEOUS | Status: DC
  Administered 2018-02-17 – 2018-02-18 (×2): 1 via NASAL
  Filled 2018-02-17: qty 22

## 2018-02-17 MED ORDER — CLOPIDOGREL BISULFATE 75 MG PO TABS
75.0000 mg | ORAL_TABLET | Freq: Every day | ORAL | Status: DC
  Administered 2018-02-17 – 2018-02-18 (×2): 75 mg via ORAL
  Filled 2018-02-17 (×2): qty 1

## 2018-02-17 MED ORDER — TIZANIDINE HCL 2 MG PO TABS
2.0000 mg | ORAL_TABLET | Freq: Three times a day (TID) | ORAL | Status: DC | PRN
  Filled 2018-02-17: qty 1

## 2018-02-17 MED ORDER — FAMOTIDINE 20 MG PO TABS
40.0000 mg | ORAL_TABLET | Freq: Two times a day (BID) | ORAL | Status: DC
  Administered 2018-02-17 – 2018-02-18 (×3): 40 mg via ORAL
  Filled 2018-02-17 (×3): qty 2

## 2018-02-17 MED ORDER — ALPRAZOLAM 0.5 MG PO TABS: 0.2500 mg | ORAL_TABLET | Freq: Two times a day (BID) | ORAL | Status: DC | PRN

## 2018-02-17 MED ORDER — DICLOFENAC SODIUM 1 % TD GEL: 2.0000 g | Freq: Two times a day (BID) | TRANSDERMAL | Status: DC

## 2018-02-17 MED ORDER — POLYETHYLENE GLYCOL 3350 17 G PO PACK
17.0000 g | PACK | Freq: Every day | ORAL | Status: DC
  Administered 2018-02-17 – 2018-02-18 (×2): 17 g via ORAL
  Filled 2018-02-17 (×2): qty 1

## 2018-02-17 MED ORDER — HYDROCODONE-ACETAMINOPHEN 5-325 MG PO TABS
1.0000 | ORAL_TABLET | Freq: Every day | ORAL | Status: DC | PRN
  Administered 2018-02-17 – 2018-02-18 (×3): 1 via ORAL
  Filled 2018-02-17 (×4): qty 1

## 2018-02-17 NOTE — Progress Notes (Signed)
Pt wanted CPAP off after about five minutes. Pt stated cpap was uncomfortable. Pt on no distress.

## 2018-02-17 NOTE — Clinical Social Work Note (Signed)
Clinical Social Work Assessment  Patient Details  Name: LEVIN DAGOSTINO MRN: [id number] Date of Birth: February 19, 1956  Date of referral:  02/17/18               Reason for consult:  Other (Comment Required)(From [employer])                Permission sought to share information with:  Facility Sport and exercise psychologist, Guardian, Family Supports Permission granted to share information::  Yes, Verbal Permission Granted  Name::     Son Mahin Guardia [phone]  Agency::     Relationship::  [employer] Information:     Housing/Transportation Living arrangements for the past 2 months:  [place] of Information:  Patient Patient Interpreter Needed:  None Criminal Activity/Legal Involvement Pertinent to Current Situation/Hospitalization:  No - Comment as needed Significant Relationships:  Adult Children Lives with:  Facility Resident Do you feel safe going back to the place where you live?  No Need for family participation in patient care:  No (Coment) Care giving concerns:  None  Facilities manager / plan: LCSW introduced myself to patient 62 year old male- oriented x4 and obtained verbal consent for contacting his son and facility if required. Patient is non ambulatory and uses a wheelchair. He is able to feed himself and does need assistance with bathing and dressing and hygiene application. Patient has good family support. 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 As per EDP MATVEY LLANAS is a 62 y.o. male with a history of a CVA on Plavix, PE DVT status post IVC filter, smoking, COPD, diabetes, hypertension who presents for evaluation of chest pain. LCSW completed assessment and fl2 started and passr number obtained. He has been living at [facility] for a year.  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) Forensic scientist:  Medicaid In [place] PT Recommendations:  Not assessed at this  time Information / Referral to community resources:     Patient/Family's Response to care:  TBD  Patient/Family's Understanding of and Emotional Response to Diagnosis, Current Treatment, and Prognosis:  Good understanding  Emotional Assessment Appearance:  Appears stated age Attitude/Demeanor/Rapport:  Gracious Affect (typically observed):  Calm, Accepting, Appropriate Orientation:  Oriented to Self, Oriented to Situation, Oriented to Place, Oriented to  Time Alcohol / Substance use:  Tobacco Use Psych involvement (Current and /or in the community):  No (Comment)  Discharge Needs  Concerns to be addressed:  No discharge needs identified Readmission within the last 30 days:  No Current discharge risk:  None Barriers to Discharge:  No Barriers Identified   Joana Reamer, LCSW 02/17/2018, 12:31 PM

## 2018-02-17 NOTE — H&P (Signed)
[facility] NAME: Chad Combs    MR#:  [id number]  DATE OF BIRTH:  Jan 14, 1956  DATE OF ADMISSION:  02/17/2018  PRIMARY CARE PHYSICIAN: Patient, No Pcp Per   REQUESTING/REFERRING PHYSICIAN:   CHIEF COMPLAINT:   Chief Complaint  Patient presents with  . Chest Pain    HISTORY OF PRESENT ILLNESS: Chad Combs  is a 62 y.o. male with a known history per below, currently bedbound due to CVA, history of PE/DVT-IVC filter in place, presenting from local nursing home with acute mid chest pressure-feels like someone sitting on his chest, occurred at 7:30 AM in the morning, radiating to the left shoulder, better with nitroglycerin, lasted approximately an hour, was noted to be mildly hypotensive in the emergency room with systolic blood pressure in 80s, emergency room work-up was unimpressive, troponin negative, EKG benign, potassium 3.2, patient evaluated in the emergency room, discussed case with the patient's son over phone per family request, patient now be admitted for acute chest pain.  PAST MEDICAL HISTORY:   Past Medical History:  Diagnosis Date  . Blind right eye   . COPD (chronic obstructive pulmonary disease) ([place])   . Diabetes mellitus without complication ([place])   . Hypertension   . Stroke [facility])     PAST SURGICAL HISTORY:  Past Surgical History:  Procedure Laterality Date  . IVC FILTER INSERTION N/A 08/24/2016   Procedure: IVC Filter Insertion;  Surgeon: Katha Cabal, MD;  Location: [facility];  Service: Cardiovascular;  Laterality: N/A;    SOCIAL HISTORY:  Social History   Tobacco Use  . Smoking status: Current Every Day Smoker    Packs/day: 1.50    Years: 45.00    Pack years: 67.50    Types: Cigarettes  . Smokeless tobacco: Never Used  Substance Use Topics  . Alcohol use: Yes    Alcohol/week: 0.0 standard drinks    Comment: occasional (once every couple months)    FAMILY HISTORY:  Family History   Problem Relation Age of Onset  . Cancer Mother   . Heart disease Father     DRUG ALLERGIES: No Known Allergies  REVIEW OF SYSTEMS:   CONSTITUTIONAL: No fever, fatigue or weakness.  EYES: No blurred or double vision.  EARS, NOSE, AND THROAT: No tinnitus or ear pain.  RESPIRATORY: No cough, shortness of breath, wheezing or hemoptysis.  CARDIOVASCULAR: + chest pain, no orthopnea, edema.  GASTROINTESTINAL: No nausea, vomiting, diarrhea or abdominal pain.  GENITOURINARY: No dysuria, hematuria.  ENDOCRINE: No polyuria, nocturia,  HEMATOLOGY: No anemia, easy bruising or bleeding SKIN: No rash or lesion. MUSCULOSKELETAL: No joint pain or arthritis.   NEUROLOGIC: No tingling, numbness, weakness.  PSYCHIATRY: No anxiety or depression.   MEDICATIONS AT HOME:  Prior to Admission medications   Medication Sig Start Date End Date Taking? Authorizing Provider  acetaminophen (TYLENOL) 325 MG tablet Take 650 mg by mouth every 8 (eight) hours as needed for mild pain or moderate pain.   Yes [provider]  aspirin 81 MG chewable tablet Chew 81 mg by mouth daily.   Yes [provider]  bisacodyl (BISACODYL) 5 MG EC tablet Take 10 mg by mouth daily as needed for moderate constipation.   Yes [provider]  budesonide-formoterol (SYMBICORT) 80-4.5 MCG/ACT inhaler Inhale 2 puffs into the lungs every 12 (twelve) hours.   Yes [provider]  clopidogrel (PLAVIX) 75 MG tablet Take 75 mg by mouth daily.  Yes [provider]  ezetimibe (ZETIA) 10 MG tablet Take 1 tablet (10 mg total) by mouth daily at 6 PM. Patient taking differently: Take 10 mg by mouth daily.  10/01/16  Yes Love, Ivan Anchors, PA-C  famotidine (PEPCID) 40 MG tablet Take 1 tablet (40 mg total) by mouth 2 (two) times daily. Patient taking differently: Take 20 mg by mouth daily.  10/01/16  Yes Love, Ivan Anchors, PA-C  fluticasone (FLONASE) 50 MCG/ACT nasal spray Place 1 spray into both nostrils daily.  08/25/16  Yes Max Sane, MD  HYDROcodone-acetaminophen (NORCO/VICODIN) 5-325 MG tablet Take 1 tablet by mouth daily as needed for moderate pain. Patient taking differently: See admin instructions. Take 1 tablet by mouth every 12 hours as scheduled and 1 tablet by mouth daily as needed for pain 05/22/17  Yes Gladstone Lighter, MD  insulin glargine (LANTUS) 100 UNIT/ML injection Inject 0.1 mLs (10 Units total) into the skin at bedtime. 05/22/17  Yes Gladstone Lighter, MD  levETIRAcetam (KEPPRA) 500 MG tablet Take 1 tablet (500 mg total) by mouth 2 (two) times daily. 08/14/17  Yes Harvest Dark, MD  lisinopril (PRINIVIL,ZESTRIL) 20 MG tablet Take 20 mg by mouth daily.   Yes [provider]  Melatonin 5 MG TABS Take 1 tablet by mouth at bedtime as needed.   Yes [provider]  metoprolol tartrate (LOPRESSOR) 25 MG tablet Take 0.5 tablets (12.5 mg total) by mouth 2 (two) times daily. 10/06/16  Yes Hendricks Limes, MD  polyethylene glycol [facility] / Floria Raveling) packet Take 17 g by mouth daily. MIX 17 GRAMS IN 4 TO 8 OUNCES WATER AND GIVE EVERY MORNING   Yes [provider]  senna-docusate (TGT SENNA LAXATIVE) 8.6-50 MG tablet Take 1 tablet by mouth 2 (two) times daily.   Yes [provider]  sertraline (ZOLOFT) 50 MG tablet Take 50 mg by mouth daily.   Yes [provider]  tamsulosin (FLOMAX) 0.4 MG CAPS capsule Take 1 capsule (0.4 mg total) by mouth daily after supper. 10/01/16  Yes Love, Ivan Anchors, PA-C  tiZANidine (ZANAFLEX) 2 MG tablet Take 1 tablet (2 mg total) by mouth at bedtime. 10/01/16  Yes Love, Ivan Anchors, PA-C      PHYSICAL EXAMINATION:   VITAL SIGNS: Blood pressure (!) 171/97, pulse 62, temperature 98.2 F (36.8 C), temperature source Oral, resp. rate (!) 24, height 5\' 9"  (1.753 m), weight 83 kg, SpO2 98 %.  GENERAL:  62 y.o.-year-old patient lying in the bed with no acute distress.  Obese eYES: Pupils equal, round, reactive to light and  accommodation. No scleral icterus. Extraocular muscles intact.  HEENT: Head atraumatic, normocephalic. Oropharynx and nasopharynx clear.  NECK:  Supple, no jugular venous distention. No thyroid enlargement, no tenderness.  LUNGS: Normal breath sounds bilaterally, no wheezing, rales,rhonchi or crepitation. No use of accessory muscles of respiration.  CARDIOVASCULAR: S1, S2 normal. No murmurs, rubs, or gallops.  ABDOMEN: Soft, nontender, nondistended. Bowel sounds present. No organomegaly or mass.  EXTREMITIES: No pedal edema, cyanosis, or clubbing.  NEUROLOGIC: Cranial nerves II through XII are intact.  Hemiparesis noted from old stroke. Gait not checked.  PSYCHIATRIC: The patient is alert and oriented x 3.  SKIN: No obvious rash, lesion, or ulcer.   LABORATORY PANEL:   CBC Recent Labs  Lab 02/17/18 0943  WBC 9.9  HGB 16.5  HCT 47.3  PLT 216  MCV 99.1  MCH 34.6*  MCHC 34.9  RDW 14.6*   ------------------------------------------------------------------------------------------------------------------  Chemistries  Recent Labs  Lab 02/17/18 0943  NA 141  K 3.2*  CL 105  CO2 28  GLUCOSE 113*  BUN 11  CREATININE 0.87  CALCIUM 8.9  AST 25  ALT 31  ALKPHOS 65  BILITOT 1.3*   ------------------------------------------------------------------------------------------------------------------ estimated creatinine clearance is 88 mL/min (by C-G formula based on SCr of 0.87 mg/dL). ------------------------------------------------------------------------------------------------------------------ No results for input(s): TSH, T4TOTAL, T3FREE, THYROIDAB in the last 72 hours.  Invalid input(s): FREET3   Coagulation profile No results for input(s): INR, PROTIME in the last 168 hours. ------------------------------------------------------------------------------------------------------------------- No results for input(s): DDIMER in the last 72  hours. -------------------------------------------------------------------------------------------------------------------  Cardiac Enzymes Recent Labs  Lab 02/17/18 0943  TROPONINI <0.03   ------------------------------------------------------------------------------------------------------------------ Invalid input(s): POCBNP  ---------------------------------------------------------------------------------------------------------------  Urinalysis    Component Value Date/Time   COLORURINE YELLOW (A) 08/14/2017 1458   APPEARANCEUR CLEAR (A) 08/14/2017 1458   LABSPEC 1.016 08/14/2017 1458   PHURINE 5.0 08/14/2017 1458   GLUCOSEU NEGATIVE 08/14/2017 1458   HGBUR SMALL (A) 08/14/2017 1458   BILIRUBINUR NEGATIVE 08/14/2017 1458   KETONESUR NEGATIVE 08/14/2017 1458   PROTEINUR 100 (A) 08/14/2017 1458   NITRITE NEGATIVE 08/14/2017 1458   LEUKOCYTESUR NEGATIVE 08/14/2017 1458     RADIOLOGY: Dg Chest 2 View  Result Date: 02/17/2018 CLINICAL DATA:  Chest pain. EXAM: CHEST - 2 VIEW COMPARISON:  Two-view chest x-ray 05/19/2017. FINDINGS: The heart size and mediastinal contours are within normal limits. Both lungs are clear. The visualized skeletal structures are unremarkable. IMPRESSION: No active cardiopulmonary disease. Electronically Signed   By: San Morelle M.D.   On: 02/17/2018 10:46    EKG: Orders placed or performed during the hospital encounter of 02/17/18  . EKG 12-Lead  . EKG 12-Lead  . ED EKG within 10 minutes  . 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 Management plans discussed with the patient, family and they are in agreement.  CODE STATUS:full Code Status History    Date Active Date Inactive Code Status Order ID Comments User Context   05/20/2017 0126 05/22/2017 1621 Full Code [id number]  Lance Coon, MD Inpatient   08/25/2016 1446 10/01/2016 1947 Full Code [id number]  Flora Lipps Inpatient   08/25/2016 1446 08/25/2016 1446 Full Code [id number]  Bary Leriche, PA-C Inpatient   08/22/2016 0110 08/25/2016 1439 Full Code [id number]  Hugelmeyer, Ubaldo Glassing, DO Inpatient       TOTAL TIME TAKING CARE OF THIS PATIENT: 45 minutes.    Avel Peace Salary M.D on 02/17/2018   Between 7am to 6pm - Pager - [phone]  After 6pm go to [email or web address] - password [email or web address] [facility]  Office  [phone]  CC: Primary care physician; Patient, No Pcp Per   Note: This dictation was prepared with Dragon dictation along with smaller phrase technology. Any transcriptional errors that result from this process are unintentional.

## 2018-02-17 NOTE — Consult Note (Signed)
Cardiology Consult    Patient ID: JAMYRON REDD MRN: [id number], DOB/AGE: [birth year]   Admit date: 02/17/2018 Date of Consult: 02/17/2018  Primary Physician: Patient, No Pcp Per Primary Cardiologist: New - seen by Johnny Bridge, MD  Requesting Provider: Ashok Norris, MD  Patient Profile    Chad Combs is a 62 y.o. 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 Jerelyn Charles.  Past Medical History   Past Medical History:  Diagnosis Date  . AAA (abdominal aortic aneurysm) ([place])    a. 08/2016 CTA: 3.1cm saccular aneurysm of distal Ao arch w/ penetrating atheromatous ulcer. 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 09/2017 CTA chest/abd/pelvis [facility]): 6.3cm infrarenal AAA, unchanged bilateral CIA aneurysms (R 3.5cm, L 2.8cm)-->pt refused intervention.  . Blind right eye   . Carotid arterial disease ([place])    a. 12/2016 CTA Neck: RICA 100%. LICA >15%; b. 06/7614 s/p trans aortic LICA revascularization and stent placement [facility]); c. 03/2017 Carotid U/[facility]): No significant LICA stenosis, patent stent.  Marland Kitchen COPD (chronic obstructive pulmonary disease) ([place])   . Diabetes mellitus without complication ([place])   . Diastolic dysfunction    a. 08/2016 Echo: EF 60%, Gr1 DD, mild MR, mildly dil LA/RV. No cardiac source of emboli.  . Femoral DVT (deep venous thrombosis) ([place])    a. 08/2016 L Fem Vein DVT ->xarelto/IVC filter. Xarelto subsequently d/c'd.  Marland Kitchen History of pulmonary embolism    a. 08/2016 following CVA-->chronic xarelto, s/p IVC filter. Xarelto subsequently d/c'd.  Marland Kitchen History of  right MCA stroke    a. 08/2016 MRI: R MCA infarct w/ occlusion of RICA & R MCA.   Marland Kitchen Hypertension   . Tobacco abuse     Past Surgical History:  Procedure Laterality Date  . IVC FILTER INSERTION N/A 08/24/2016   Procedure: IVC Filter Insertion;  Surgeon: Katha Cabal, MD;  Location: [facility];  Service: Cardiovascular;  Laterality: N/A;     Allergies  No Known Allergies  History of Present Illness    62 year old male with the above complex past medical history including hypertension, tobacco abuse, and COPD.  He has no prior cardiac history.  In March 2018, he was admitted with left-sided weakness and was found to have a right MCA territory infarct with occlusion of the right internal carotid artery and right MCA.  Carotid ultrasound also revealed greater than 70% left internal carotid artery stenosis.  Echocardiogram at that time showed normal LV function.  History of course was complicated by right femoral vein DVT and also PE requiring placement of an IVC filter and initiation of Xarelto therapy. Xarelto has since been discontinued. 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  He was seen by vascular surgery while hospitalized at [facility] and it was felt that he would require left internal carotid and also aortic interventions following recovery from stroke.  Patient was seen once by vascular surgery  in [place] following discharge and then subsequently follow-up at [facility].  He underwent left internal carotid artery stenting in August 2018.  He has been followed by vascular surgery since with carotid ultrasound in October 2018 showing no hemodynamically significant stenoses in the left internal carotid artery.  With regards to his abdominal aortic aneurysm, he is not in favor of any intervention.  Most  recent CTA in April 2018 showed stable, 6.3 cm infrarenal AAA with 3.5 cm right and 2.8 cm left common iliac artery aneurysms.  Patient stays in a skilled nursing facility and is more or less wheelchair bound in the setting of persistent left-sided weakness.  He initially says that he cut way back on smoking but then says he does not smoke at all.  He was in his usual state of health until awakening early this morning with right-sided chest heaviness associated with dyspnea.  There were no other associated symptoms.  EMS was called and he was given nitroglycerin and 4 baby aspirin with relief of discomfort.  On arrival to the emergency department, ECG was nonacute and troponin was normal.  He is currently chest pain-free.  Inpatient Medications    . aspirin  81 mg Oral Daily  . clopidogrel  75 mg Oral Daily  . diclofenac sodium  2 g Topical BID  . diclofenac sodium  4 g Topical QHS  . enoxaparin (LOVENOX) injection  40 mg Subcutaneous Q24H  . ezetimibe  10 mg Oral Daily  . famotidine  40 mg Oral BID  . fluticasone  1 spray Each Nare Daily  . insulin aspart  0-15 Units Subcutaneous TID WC  . insulin aspart  0-5 Units Subcutaneous QHS  . insulin glargine  10 Units Subcutaneous QHS  . levETIRAcetam  500 mg Oral BID  . metoprolol tartrate  12.5 mg Oral BID  . mometasone-formoterol  2 puff Inhalation BID  . polyethylene glycol  17 g Oral Daily  . senna  1 tablet Oral BID  . sertraline  50 mg Oral Daily  . tamsulosin  0.4 mg Oral QPC supper    Family History    Family History  Problem Relation Age of Onset  . Cancer Mother   . Heart disease Father    He indicated that his mother is deceased. He indicated that his father is deceased. He indicated that his sister is alive.   Social History    Social History   Socioeconomic History  . Marital status: Widowed    Spouse name: Not on file  . Number of children: Not on file  . Years of education: Not on file  . Highest education  level: Not on file  Occupational History  . Occupation: Retired Investment banker, operational  . Financial resource strain: Not on file  . Food insecurity:    Worry: Not on file    Inability: Not on file  . Transportation needs:    Medical: Not on file    Non-medical: Not on file  Tobacco Use  . Smoking status: Current Some Day Smoker    Packs/day: 1.50    Years: 45.00    Pack years: 67.50    Types: Cigarettes  . Smokeless tobacco: Never Used  Substance and Sexual Activity  . Alcohol use: Yes    Alcohol/week: 0.0 standard drinks    Comment: occasional (once every couple months)  . Drug use: No  . Sexual activity: Never  Lifestyle  . Physical activity:    Days per week:  Not on file    Minutes per session: Not on file  . Stress: Not on file  Relationships  . Social connections:    Talks on phone: Not on file    Gets together: Not on file    Attends religious service: Not on file    Active member of club or organization: Not on file    Attends meetings of clubs or organizations: Not on file    Relationship status: Not on file  . Intimate partner violence:    Fear of current or ex partner: Not on file    Emotionally abused: Not on file    Physically abused: Not on file    Forced sexual activity: Not on file  Other Topics Concern  . Not on file  Social History Narrative   From [place].  Lives in [place] @ [facility] since his CVA in 2018.  L hemiparesis --> w/c bound.     Review of Systems    General:  No chills, fever, night sweats or weight changes.  Cardiovascular:  +++ chest pain, +++ dyspnea, no edema, orthopnea, palpitations, paroxysmal nocturnal dyspnea. Dermatological: No rash, lesions/masses Respiratory: No cough, dyspnea Urologic: No hematuria, dysuria Abdominal:   No nausea, vomiting, diarrhea, bright red blood per rectum, melena, or hematemesis Neurologic: Chronic left-sided weakness, no visual changes, changes in mental status. All other systems reviewed  and are otherwise negative except as noted above.  Physical Exam    Blood pressure (!) 158/95, pulse 66, temperature 98.1 F (36.7 C), temperature source Oral, resp. rate 19, height 5\' 9"  (1.753 m), weight 82.4 kg, SpO2 96 %.  General: Pleasant, NAD Psych: Normal affect. Neuro: Alert and oriented X 3.  Left hemiparesis. HEENT: Poor dentition.  Neck: Supple without bruits or JVD. Lungs:  Resp regular and unlabored, CTA. Heart: RRR no s3, s4, or murmurs. Abdomen: Soft, non-tender, non-distended, BS + x 4.  Extremities: No clubbing, cyanosis or edema. DP/PT/Radials 2+ and equal bilaterally.  Labs     Recent Labs    02/17/18 0943  TROPONINI <0.03   Lab Results  Component Value Date   WBC 9.9 02/17/2018   HGB 16.5 02/17/2018   HCT 47.3 02/17/2018   MCV 99.1 02/17/2018   PLT 216 02/17/2018    Recent Labs  Lab 02/17/18 0943  NA 141  K 3.2*  CL 105  CO2 28  BUN 11  CREATININE 0.87  CALCIUM 8.9  PROT 7.5  BILITOT 1.3*  ALKPHOS 65  ALT 31  AST 25  GLUCOSE 113*   Lab Results  Component Value Date   CHOL 193 08/22/2016   HDL 35 (L) 08/22/2016   LDLCALC UNABLE TO CALCULATE IF TRIGLYCERIDE OVER 400 mg/dL 08/22/2016   TRIG 441 (H) 08/22/2016    Radiology Studies    Dg Chest 2 View  Result Date: 02/17/2018 CLINICAL DATA:  Chest pain. EXAM: CHEST - 2 VIEW COMPARISON:  Two-view chest x-ray 05/19/2017. FINDINGS: The heart size and mediastinal contours are within normal limits. Both lungs are clear. The visualized skeletal structures are unremarkable. IMPRESSION: No active cardiopulmonary disease. Electronically Signed   By: San Morelle M.D.   On: 02/17/2018 10:46    ECG & Cardiac Imaging    RSR, 64, no acute ST/T changes.  Assessment & Plan    1.  Unstable angina: Patient without prior cardiac history of the multiple risk factors including hypertension, hyperlipidemia, tobacco abuse, cerebral and peripheral arterial disease.  He presented to the emergency  department  today after an episode of nitrate responsive chest discomfort that awoke him from sleep.  He has never had anything like this before.  Symptoms lasted about an hour.  Thus far, ECG is nonacute and troponin normal.  Agree with continued cycling of troponin.  Continue aspirin, Plavix, beta-blocker, and Zetia.  Unclear as to why he is not on statin.  Check echo.  Provided normal LV function and normal enzymes, reasonable to consider stress testing-inpatient versus outpatient.  2.  Essential hypertension: Blood pressure elevated on arrival.  Resume home medications and follow.  3.  Hyperlipidemia: On Zetia only.  Consider statin if not previously known to be intolerant.  4.  Tobacco abuse: Cessation advised.  5.  History of DVT and PE: This occurred following stroke in 2018.  Status post IVC filter.  No longer on Xarelto.  6.  Carotid arterial disease: Status post left internal carotid artery stenting last year.  Followed at [facility].  Stable by carotid ultrasound in October 2018.  7.  Abdominal aortic aneurysm: Patient wishes to avoid surgery.  This is followed at [facility].  Stable at 6.3 cm by CTA in April of this year.  Signed, Murray Hodgkins, NP 02/17/2018, 1:27 PM  For questions or updates, please contact   Please consult [email or web address] for contact info under Cardiology/STEMI.

## 2018-02-17 NOTE — Progress Notes (Signed)
*  PRELIMINARY RESULTS* Echocardiogram 2D Echocardiogram has been performed.  Chad Combs 02/17/2018, 7:13 PM

## 2018-02-17 NOTE — ED Notes (Signed)
Attempt to call report X 1 unsuccessful.  

## 2018-02-17 NOTE — Progress Notes (Signed)
Family Meeting Note  Advance Directive:yes  Today a meeting took place with the Patient.  Patient is able to participate  The following clinical team members were present during this meeting:MD  The following were discussed:Patient's diagnosis: Chest pain, COPD, CVA, diabetes, stroke with hemiparesis, history of PE/DVT, Patient's progosis: Unable to determine and Goals for treatment: Full Code  Additional follow-up to be provided: prn  Time spent during discussion:20 minutes  Gorden Harms, MD

## 2018-02-17 NOTE — ED Triage Notes (Signed)
To ER via [facility] from [facility] c/o sudden onset of central CP radiating to left shoulder at 0730AM today while sitting. Staff at facility report BP of 89/49 prior to EMS arrival. EMS reports BP of 188/102. Pt received 324 ASA and 1 spray nitroglycerin en route. 2/10 CP at this time. Pt in NAD. RR even and unlabored. Color WNL.

## 2018-02-17 NOTE — ED Notes (Signed)
Report to Jessica, RN

## 2018-02-17 NOTE — ED Notes (Signed)
Patient transported to X-ray 

## 2018-02-17 NOTE — Progress Notes (Addendum)
Pt complaining of pain to right side of neck. Upon assessment, pt noted to have small area on neck that is reddened, inflamed, and tender to touch. Warm compress applied and pt instructed to call RN if pain does not subside. Pt informed that pain medication is available if heat compresses are not helpful. Will continue to monitor.   Pt resting comfortably upon reassessment of pain. Will continue to monitor.

## 2018-02-17 NOTE — ED Provider Notes (Signed)
[facility] Emergency Department Provider Note  ____________________________________________  Time seen: Approximately 10:20 AM  I have reviewed the triage vital signs and the nursing notes.   HISTORY  Chief Complaint Chest Pain   HPI Chad Combs is a 62 y.o. male with a history of a CVA on Plavix, PE DVT status post IVC filter, smoking, COPD, diabetes, hypertension who presents for evaluation of chest pain.  Patient reports that the pain woke him up from his sleep at 7:30 AM.  He felt like somebody was sitting in his chest, pain was substernal, constant and radiating to the L arm.  He denies nausea, vomiting, diaphoresis, shortness of breath or dizziness associated with the pain.  The pain was present for about an hour until he received nitroglycerin per EMS.  He has no pain at this time.  Patient continues to smoke.  He denies any personal history of heart disease.  His father had a heart attack.  Past Medical History:  Diagnosis Date  . Blind right eye   . COPD (chronic obstructive pulmonary disease) ([place])   . Diabetes mellitus without complication ([place])   . Hypertension   . Stroke [facility])     Patient Active Problem List   Diagnosis Date Noted  . UTI (urinary tract infection) 05/19/2017  . Seizure-like activity ([place]) 05/19/2017  . Hyperlipidemia 11/08/2016  . Type II diabetes mellitus with neurological manifestations, uncontrolled ([place]) 10/04/2016  . Dyslipidemia associated with type 2 diabetes mellitus ([place]) 10/04/2016  . AAA (abdominal aortic aneurysm) without rupture ([place])   . Acute deep vein thrombosis (DVT) of femoral vein of left lower extremity ([place])   . Pulmonary embolus ([place])   . Adjustment disorder   . Acute right MCA stroke ([place]) 08/25/2016  . Left hemiplegia ([place])   . Dysarthria, post-stroke   . Dysphagia, post-stroke   . Carotid stenosis   . Benign essential HTN   . Leukocytosis   . Polycythemia vera ([place])   . Tobacco abuse   .  Chronic obstructive pulmonary disease ([place])   . 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    Past Surgical History:  Procedure Laterality Date  . IVC FILTER INSERTION N/A 08/24/2016   Procedure: IVC Filter Insertion;  Surgeon: Katha Cabal, MD;  Location: [place] [facility];  Service: Cardiovascular;  Laterality: N/A;    Prior to Admission medications   Medication Sig Start Date End Date Taking? Authorizing Provider  acetaminophen (TYLENOL) 500 MG tablet Take 500 mg by mouth at bedtime as needed.     [provider]  aspirin 81 MG chewable tablet Chew 81 mg by mouth daily.    [provider]  bisacodyl (BISACODYL) 5 MG EC tablet Take 10 mg by mouth daily as needed for moderate constipation.    [provider]  budesonide-formoterol (SYMBICORT) 80-4.5 MCG/ACT inhaler Inhale 2 puffs into the lungs every 12 (twelve) hours.    [provider]  clopidogrel (PLAVIX) 75 MG tablet Take 75 mg by mouth daily.    [provider]  diclofenac sodium (VOLTAREN) 1 % GEL Apply 2-4 g topically 2 (two) times daily. APPLY 2MG  TO LEFT SHOULDER TWICE DAILY AND 4 GRAMS TO LEFT HIP AT BEDTIME    [provider]  ezetimibe (ZETIA) 10 MG tablet Take 1 tablet (10 mg total) by mouth daily at 6 PM. Patient taking differently: Take 10 mg by mouth daily.  10/01/16   Love, Ivan Anchors, PA-C  famotidine (PEPCID) 40 MG tablet  Take 1 tablet (40 mg total) by mouth 2 (two) times daily. 10/01/16   Love, Ivan Anchors, PA-C  fluticasone (FLONASE) 50 MCG/ACT nasal spray Place 1 spray into both nostrils daily. 08/25/16   Max Sane, MD  HYDROcodone-acetaminophen (NORCO/VICODIN) 5-325 MG tablet Take 1 tablet by mouth daily as needed for moderate pain. 05/22/17   Gladstone Lighter, MD  insulin aspart (NOVOLOG) 100 UNIT/ML injection Inject 0-9 Units into the skin 3 (three) times daily. SLIDING SCALE: 0-99 (0u), 100-149 (2u), 150-199 (3u), 200-249 (4u), 250-299 (5u), 300-349 (6u), 350-399 (7u), 400-449  (8u), 450-499 (9u) - IF 500+ CONTACT PHYSICIAN    [provider]  insulin glargine (LANTUS) 100 UNIT/ML injection Inject 0.1 mLs (10 Units total) into the skin at bedtime. 05/22/17   Gladstone Lighter, MD  levETIRAcetam (KEPPRA) 500 MG tablet Take 1 tablet (500 mg total) by mouth 2 (two) times daily. 08/14/17   Harvest Dark, MD  lisinopril (PRINIVIL,ZESTRIL) 20 MG tablet Take 20 mg by mouth daily.    [provider]  Melatonin 5 MG TABS Take 1 tablet by mouth at bedtime as needed.    [provider]  metFORMIN (GLUCOPHAGE) 1000 MG tablet Take 1,000 mg by mouth 2 (two) times daily with a meal.    [provider]  metoprolol tartrate (LOPRESSOR) 25 MG tablet Take 0.5 tablets (12.5 mg total) by mouth 2 (two) times daily. 10/06/16   Hendricks Limes, MD  polyethylene glycol [facility]) packet Take 17 g by mouth daily. MIX 17 GRAMS IN 4 TO 8 OUNCES WATER AND GIVE EVERY MORNING    [provider]  senna (SENOKOT) 8.6 MG TABS tablet Take 1 tablet by mouth 2 (two) times daily.    [provider]  sertraline (ZOLOFT) 50 MG tablet Take 50 mg by mouth daily.    [provider]  tamsulosin (FLOMAX) 0.4 MG CAPS capsule Take 1 capsule (0.4 mg total) by mouth daily after supper. 10/01/16   Love, Ivan Anchors, PA-C  tiZANidine (ZANAFLEX) 2 MG tablet Take 1 tablet (2 mg total) by mouth at bedtime. Patient taking differently: Take 2-4 mg by mouth 4 (four) times daily. TAKE 4MG  BY MOUTH THREE TIMES DAILY AND 2MG  BY MOUTH AT BEDTIME 10/01/16   Love, Ivan Anchors, PA-C    Allergies Patient has no known allergies.  Family History  Problem Relation Age of Onset  . Cancer Mother   . Heart disease Father     Social History Social History   Tobacco Use  . Smoking status: Current Every Day Smoker    Packs/day: 1.50    Years: 45.00    Pack years: 67.50    Types: Cigarettes  . Smokeless tobacco: Never Used  Substance Use Topics  . Alcohol use:  Yes    Alcohol/week: 0.0 standard drinks    Comment: occasional (once every couple months)  . Drug use: No    Review of Systems  Constitutional: Negative for fever. Eyes: Negative for visual changes. ENT: Negative for sore throat. Neck: No neck pain  Cardiovascular: + chest pain. Respiratory: Negative for shortness of breath. Gastrointestinal: Negative for abdominal pain, vomiting or diarrhea. Genitourinary: Negative for dysuria. Musculoskeletal: Negative for back pain. Skin: Negative for rash. Neurological: Negative for headaches, weakness or numbness. Psych: No SI or HI  ____________________________________________   PHYSICAL EXAM:  VITAL SIGNS: ED Triage Vitals  Enc Vitals Group     BP 02/17/18 0939 (!) 153/95     Pulse Rate 02/17/18 0938  62     Resp 02/17/18 0938 14     Temp 02/17/18 0939 98.2 F (36.8 C)     Temp Source 02/17/18 0939 Oral     SpO2 02/17/18 0938 98 %     Weight 02/17/18 0938 183 lb (83 kg)     Height 02/17/18 0938 5\' 9"  (1.753 m)     Head Circumference --      Peak Flow --      Pain Score 02/17/18 0938 2     Pain Loc --      Pain Edu? --      Excl. in [place]? --     Constitutional: Alert and oriented. Well appearing and in no apparent distress. HEENT:      Head: Normocephalic and atraumatic.         Eyes: Conjunctivae are normal. Sclera is non-icteric.       Mouth/Throat: Mucous membranes are moist.       Neck: Supple with no signs of meningismus. Cardiovascular: Regular rate and rhythm. No murmurs, gallops, or rubs. 2+ symmetrical distal pulses are present in all extremities. No JVD. Respiratory: Normal respiratory effort. Lungs are clear to auscultation bilaterally. No wheezes, crackles, or rhonchi.  Gastrointestinal: Soft, non tender, and non distended with positive bowel sounds. No rebound or guarding. Musculoskeletal: Nontender with normal range of motion in all extremities. No edema, cyanosis, or erythema of extremities. Neurologic:  Normal speech and language. Face is symmetric. L sided hemiparesis Skin: Skin is warm, dry and intact. No rash noted. Psychiatric: Mood and affect are normal. Speech and behavior are normal.  ____________________________________________   LABS (all labs ordered are listed, but only abnormal results are displayed)  Labs Reviewed  CBC - Abnormal; Notable for the following components:      Result Value   MCH 34.6 (*)    RDW 14.6 (*)    All other components within normal limits  COMPREHENSIVE METABOLIC PANEL - Abnormal; Notable for the following components:   Potassium 3.2 (*)    Glucose, Bld 113 (*)    Total Bilirubin 1.3 (*)    All other components within normal limits  TROPONIN I  LIPASE, BLOOD   ____________________________________________  EKG  ED ECG REPORT I, Rudene Re, the attending physician, personally viewed and interpreted this ECG.  Normal sinus rhythm, rate of 64, first-degree AV block, normal QRS and QTC, normal axis, no ST elevations or depressions.  Unchanged from prior. ____________________________________________  RADIOLOGY  I have personally reviewed the images performed during this visit and I agree with the Radiologist's read.   Interpretation by Radiologist:  Dg Chest 2 View  Result Date: 02/17/2018 CLINICAL DATA:  Chest pain. EXAM: CHEST - 2 VIEW COMPARISON:  Two-view chest x-ray 05/19/2017. FINDINGS: The heart size and mediastinal contours are within normal limits. Both lungs are clear. The visualized skeletal structures are unremarkable. IMPRESSION: No active cardiopulmonary disease. Electronically Signed   By: San Morelle M.D.   On: 02/17/2018 10:46     ____________________________________________   PROCEDURES  Procedure(s) performed: None Procedures Critical Care performed:  None ____________________________________________   INITIAL IMPRESSION / ASSESSMENT AND PLAN / ED COURSE  62 y.o. male with a history of a CVA on  Plavix, PE DVT status post IVC filter, smoking, COPD, diabetes, hypertension who presents for evaluation of chest pain.   History of chest pain concerning for possible cardiac etiology. Exam is benign. EKG and initial cardiac labs are without evidence of ischemia. Clinical picture and evaluation  not suggestive of other serious cause including pneumonia, pulmonary embolism, pneumothorax, esophageal rupture, or aortic dissection. Patient will be admitted for chest pain evaluation. Patient given ASA prior to arrival  I discussed the diagnosis and plan of care with the patient and answered all his questions. He states understanding and is in agreement with admission for further work up.       As part of my medical decision making, I reviewed the following data within the [place] notes reviewed and incorporated, Labs reviewed , EKG interpreted , Old EKG reviewed, Old chart reviewed, Radiograph reviewed , Discussed with admitting physician Dr. Jerelyn Charles, Notes from prior ED visits and [place] Controlled Substance Database    Pertinent labs & imaging results that were available during my care of the patient were reviewed by me and considered in my medical decision making (see chart for details).    ____________________________________________   FINAL CLINICAL IMPRESSION(S) / ED DIAGNOSES  Final diagnoses:  Chest pain, unspecified type      NEW MEDICATIONS STARTED DURING THIS VISIT:  ED Discharge Orders    None       Note:  This document was prepared using Dragon voice recognition software and may include unintentional dictation errors.    Alfred Levins, [place], MD 02/17/18 1125

## 2018-02-17 NOTE — NC FL2 (Signed)
  [place] LEVEL OF CARE SCREENING TOOL     IDENTIFICATION  Patient Name: Chad Combs Birthdate: 1955/11/10 Sex: male Admission Date (Current Location): 02/17/2018  [place] and [place] Number:  Engineering geologist and Address:  [facility], [address] [place], [place] [address]      Provider Number: [id number]  Attending Physician Name and Address:  Gorden Harms, MD  Relative Name and Phone Number:     Lionel Woodberry Son [phone]  Current Level of Care: SNF Recommended Level of Care: [place] Prior Approval Number:    Date Approved/Denied:   PASRR Number:   [id number]  Discharge Plan: SNF    Current Diagnoses: Patient Active Problem List   Diagnosis Date Noted  . Chest pain 02/17/2018  . UTI (urinary tract infection) 05/19/2017  . Seizure-like activity ([place]) 05/19/2017  . Hyperlipidemia 11/08/2016  . Type II diabetes mellitus with neurological manifestations, uncontrolled ([place]) 10/04/2016  . Dyslipidemia associated with type 2 diabetes mellitus ([place]) 10/04/2016  . AAA (abdominal aortic aneurysm) without rupture ([place])   . Acute deep vein thrombosis (DVT) of femoral vein of left lower extremity ([place])   . Pulmonary embolus ([place])   . Adjustment disorder   . Acute right MCA stroke ([place]) 08/25/2016  . Left hemiplegia ([place])   . Dysarthria, post-stroke   . Dysphagia, post-stroke   . Carotid stenosis   . Benign essential HTN   . Leukocytosis   . Polycythemia vera ([place])   . Tobacco abuse   . Chronic obstructive pulmonary disease ([place])   . 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    Orientation RESPIRATION BLADDER Height & Weight     Self, Time, Situation, Place  Normal Continent Weight: 183 lb (83 kg) Height:  5\' 9"  (175.3 cm)  BEHAVIORAL SYMPTOMS/MOOD NEUROLOGICAL BOWEL NUTRITION STATUS      Continent    AMBULATORY STATUS COMMUNICATION OF NEEDS Skin   Limited Assist(uses wheelchair) Verbally Normal                        Personal Care Assistance Level of Assistance  Bathing, Dressing Bathing Assistance: Limited assistance Feeding assistance: Independent Dressing Assistance: Limited assistance     Functional Limitations Info  Sight, Hearing, Speech(Blind in right eye) Sight Info: Impaired Hearing Info: Adequate Speech Info: Adequate    SPECIAL CARE FACTORS FREQUENCY   Uses wheel chair-non ambulatory                    Contractures Contractures Info: Not present    Additional Factors Info                  Current Medications (02/17/2018):  This is the current hospital active medication list Current Facility-Administered Medications  Medication Dose Route Frequency Provider Last Rate Last Dose  . insulin aspart (novoLOG) injection 0-15 Units  0-15 Units Subcutaneous TID WC Salary, Montell D, MD      . insulin aspart (novoLOG) injection 0-5 Units  0-5 Units Subcutaneous QHS Salary, Montell D, MD         Discharge Medications: Please see discharge summary for a list of discharge medications.  Relevant Imaging Results:  Relevant Lab Results:   Additional Information SSN [id number]  Joana Reamer, [place]

## 2018-02-17 NOTE — Plan of Care (Signed)

## 2018-02-18 ENCOUNTER — Observation Stay (INDEPENDENT_AMBULATORY_CARE_PROVIDER_SITE_OTHER): Payer: Medicaid Other

## 2018-02-18 DIAGNOSIS — I6523 Occlusion and stenosis of bilateral carotid arteries: Secondary | ICD-10-CM

## 2018-02-18 DIAGNOSIS — R079 Chest pain, unspecified: Secondary | ICD-10-CM

## 2018-02-18 DIAGNOSIS — I208 Other forms of angina pectoris: Secondary | ICD-10-CM | POA: Diagnosis not present

## 2018-02-18 DIAGNOSIS — I714 Abdominal aortic aneurysm, without rupture: Secondary | ICD-10-CM | POA: Diagnosis not present

## 2018-02-18 DIAGNOSIS — I25118 Atherosclerotic heart disease of native coronary artery with other forms of angina pectoris: Secondary | ICD-10-CM

## 2018-02-18 DIAGNOSIS — I739 Peripheral vascular disease, unspecified: Secondary | ICD-10-CM | POA: Diagnosis not present

## 2018-02-18 LAB — NM MYOCAR MULTI W/SPECT W/WALL MOTION / EF
CSEPED: 1 min
CSEPEDS: 31 s
CSEPEW: 1 METS
CSEPPHR: 92 {beats}/min
LV dias vol: 58 mL (ref 62–150)
LV sys vol: 18 mL
NUC STRESS TID: 0.78
Rest HR: 59 {beats}/min
SDS: 0
SRS: 0
SSS: 0

## 2018-02-18 LAB — GLUCOSE, CAPILLARY
GLUCOSE-CAPILLARY: 103 mg/dL — AB (ref 70–99)
GLUCOSE-CAPILLARY: 139 mg/dL — AB (ref 70–99)

## 2018-02-18 LAB — HIV ANTIBODY (ROUTINE TESTING W REFLEX): HIV SCREEN 4TH GENERATION: NONREACTIVE

## 2018-02-18 MED ORDER — HYDROCODONE-ACETAMINOPHEN 5-325 MG PO TABS
1.0000 | ORAL_TABLET | ORAL | Status: AC
  Administered 2018-02-18: 1 via ORAL

## 2018-02-18 MED ORDER — ATORVASTATIN CALCIUM 20 MG PO TABS: 40.0000 mg | ORAL_TABLET | Freq: Every day | ORAL | Status: DC

## 2018-02-18 MED ORDER — ATORVASTATIN CALCIUM 40 MG PO TABS: 40.0000 mg | ORAL_TABLET | Freq: Every day | ORAL | Status: DC

## 2018-02-18 MED ORDER — TECHNETIUM TC 99M TETROFOSMIN IV KIT
13.9200 | PACK | Freq: Once | INTRAVENOUS | Status: AC | PRN
  Administered 2018-02-18: 13.92 via INTRAVENOUS

## 2018-02-18 MED ORDER — TECHNETIUM TC 99M TETROFOSMIN IV KIT
31.6700 | PACK | Freq: Once | INTRAVENOUS | Status: AC | PRN
  Administered 2018-02-18: 31.67 via INTRAVENOUS

## 2018-02-18 MED ORDER — REGADENOSON 0.4 MG/5ML IV SOLN
0.4000 mg | Freq: Once | INTRAVENOUS | Status: AC
  Administered 2018-02-18: 0.4 mg via INTRAVENOUS

## 2018-02-18 NOTE — Progress Notes (Signed)
Progress Note  Patient Name: Chad Combs Date of Encounter: 02/18/2018  Primary Cardiologist: Ida Rogue, MD   Subjective   He denies any further chest pain symptoms Stress test for this morning unable to treadmill  Inpatient Medications    Scheduled Meds: . amLODipine  5 mg Oral Daily  . aspirin  81 mg Oral Daily  . atorvastatin  40 mg Oral q1800  . Chlorhexidine Gluconate Cloth  6 each Topical Q0600  . clopidogrel  75 mg Oral Daily  . diclofenac sodium  2 g Topical BID  . diclofenac sodium  4 g Topical QHS  . enoxaparin (LOVENOX) injection  40 mg Subcutaneous Q24H  . ezetimibe  10 mg Oral Daily  . famotidine  40 mg Oral BID  . fluticasone  1 spray Each Nare Daily  . insulin aspart  0-15 Units Subcutaneous TID WC  . insulin aspart  0-5 Units Subcutaneous QHS  . insulin glargine  10 Units Subcutaneous QHS  . levETIRAcetam  500 mg Oral BID  . metoprolol tartrate  12.5 mg Oral BID  . mometasone-formoterol  2 puff Inhalation BID  . mupirocin ointment  1 application Nasal BID  . polyethylene glycol  17 g Oral Daily  . senna  1 tablet Oral BID  . sertraline  50 mg Oral Daily  . tamsulosin  0.4 mg Oral QPC supper   Continuous Infusions:  PRN Meds: acetaminophen, ALPRAZolam, bisacodyl, gi cocktail, HYDROcodone-acetaminophen, Melatonin, morphine injection, ondansetron (ZOFRAN) IV, tiZANidine   Vital Signs    Vitals:   02/17/18 2210 02/17/18 2257 02/18/18 0509 02/18/18 0720  BP:  (!) 155/78 (!) 163/84 (!) 151/74  Pulse: 64 65 (!) 58 (!) 56  Resp:   16 18  Temp:  98 F (36.7 C) 98.3 F (36.8 C) 98.4 F (36.9 C)  TempSrc:  Oral Oral Oral  SpO2: 94% 95% 97% 97%  Weight:   81.2 kg   Height:        Intake/Output Summary (Last 24 hours) at 02/18/2018 1636 Last data filed at 02/18/2018 1544 Gross per 24 hour  Intake 240 ml  Output 750 ml  Net -510 ml   Filed Weights   02/17/18 0938 02/17/18 1248 02/18/18 0509  Weight: 83 kg 82.4 kg 81.2 kg    Telemetry      Normal sinus rhythm - Personally Reviewed  ECG      Physical Exam   Constitutional:  oriented to person, place, and time. No distress.  HENT:  Head: Normocephalic and atraumatic.  Eyes:  no discharge. No scleral icterus.  Neck: Normal range of motion. Neck supple. No JVD present.  Cardiovascular: Normal rate, regular rhythm, normal heart soundsExam reveals no gallop and no friction rub. No edema No murmur heard.unable to appreciate distal lower extremity pulses Pulmonary/Chest: moderately decreased breath sounds throughout,  No stridor. No respiratory distress.  no wheezes.  no rales.  no tenderness.  Abdominal: Soft.  no distension.  no tenderness.  Musculoskeletal: Normal range of motion.  no  tenderness or deformity.  Neurological:  normal muscle tone. Coordination normal. No atrophy Skin: Skin is warm and dry. No rash noted. not diaphoretic.  Psychiatric:  normal mood and affect. behavior is normal. Thought content normal.     Labs    Chemistry Recent Labs  Lab 02/17/18 0943  NA 141  K 3.2*  CL 105  CO2 28  GLUCOSE 113*  BUN 11  CREATININE 0.87  CALCIUM 8.9  PROT 7.5  ALBUMIN  3.9  AST 25  ALT 31  ALKPHOS 65  BILITOT 1.3*  GFRNONAA >60  GFRAA >60  ANIONGAP 8     Hematology Recent Labs  Lab 02/17/18 0943  WBC 9.9  RBC 4.78  HGB 16.5  HCT 47.3  MCV 99.1  MCH 34.6*  MCHC 34.9  RDW 14.6*  PLT 216    Cardiac Enzymes Recent Labs  Lab 02/17/18 0943 02/17/18 1611 02/17/18 1800 02/17/18 2329  TROPONINI <0.03 <0.03 <0.03 <0.03   No results for input(s): TROPIPOC in the last 168 hours.   BNPNo results for input(s): BNP, PROBNP in the last 168 hours.   DDimer No results for input(s): DDIMER in the last 168 hours.   Radiology    Dg Chest 2 View  Result Date: 02/17/2018 CLINICAL DATA:  Chest pain. EXAM: CHEST - 2 VIEW COMPARISON:  Two-view chest x-ray 05/19/2017. FINDINGS: The heart size and mediastinal contours are within normal limits.  Both lungs are clear. The visualized skeletal structures are unremarkable. IMPRESSION: No active cardiopulmonary disease. Electronically Signed   By: San Morelle M.D.   On: 02/17/2018 10:46   Nm Myocar Multi W/spect W/wall Motion / Ef  Result Date: 02/18/2018 Pharmacological myocardial perfusion imaging study with no significant  ischemia Normal wall motion, EF estimated at 73% No EKG changes concerning for ischemia at peak stress or in recovery. Low risk scan Signed, Esmond Plants, MD, Ph.D [facility]    Cardiac Studies   Stress Myoview Pharmacological myocardial perfusion imaging study with no significant  ischemia Normal wall motion, EF estimated at 73% No EKG changes concerning for ischemia at peak stress or in recovery. Low risk scan  Echocardiogram - Left ventricle: The cavity size was normal. Systolic function was   normal. The estimated ejection fraction was in the range of 55%   to 60%. Wall motion was normal; there were no regional wall   motion abnormalities. Doppler parameters are consistent with   abnormal left ventricular relaxation (grade 1 diastolic   dysfunction). - Left atrium: The atrium was normal in size. - Right ventricle: Systolic function was normal. - Pulmonary arteries: Systolic pressure was within the normal   range.    Patient Profile   62 year old gentleman with long history of smoking, stroke March 2018, DVT, pulmonary embolism requiring IVC filter, COPD, type 2 diabetes, carotid stenosis/stenting August 2018, AAA 6.3 cm followed at [facility], presenting with episode of chest pain   Assessment & Plan    A/P; Chest pain severe risk factors of underlying coronary disease, Known PAD carotids and aorta Severe coronary calcifications on prior CT scan three-vessel Unable to treadmill  pharmacologic Myoview this morning with no significant ischemia Normal ejection fraction greater than 55% on echocardiogram Continue aspirin, Lipitor, Plavix,  Zetia, beta blocker  PAD Severe carotid disease followed at [facility], ccluded on the right, severe disease on the left AAA, 6 cm.  Followed at [facility] Iliac aneurysms Notes indicating he previously refused intervention Smoking cessation recommended Long discussion with him that he should pursue intervention  History of stroke 2018 Severe peripheral vascular disease R MCA infarct w/ occlusion of RICA & R MCA.  DVT/PE Previously treated with anticoagulation,  IVC filter placed   Hypertension Medications as above, also on amlodipine    Total encounter time more than 25 minutes  Greater than 50% was spent in counseling and coordination of care with the patient   For questions or updates, please contact [place] Please consult [email or web address] for contact info under  Cardiology/STEMI.      Signed, Ida Rogue, MD  02/18/2018, 4:36 PM

## 2018-02-18 NOTE — Progress Notes (Signed)
Patient complains of 8/10 pain on left side. MD notified for one time dose of hydrocodone. I will continue to assess

## 2018-02-18 NOTE — Discharge Summary (Addendum)
[facility] NAME: Sahil Milner    MR#:  [id number]  DATE OF BIRTH:  [birth year]  DATE OF ADMISSION:  02/17/2018   ADMITTING PHYSICIAN: Gorden Harms, MD  DATE OF DISCHARGE: 02/18/2018  PRIMARY CARE PHYSICIAN: Patient, No Pcp Per   ADMISSION DIAGNOSIS:  Chest pain, unspecified type [R07.9] DISCHARGE DIAGNOSIS:  Active Problems:   Chest pain  SECONDARY DIAGNOSIS:   Past Medical History:  Diagnosis Date  . AAA (abdominal aortic aneurysm) ([place])    a. 08/2016 CTA: 3.1cm saccular aneurysm of distal Ao arch w/ penetrating atheromatous ulcer. 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 09/2017 CTA chest/abd/pelvis [facility]): 6.3cm infrarenal AAA, unchanged bilateral CIA aneurysms (R 3.5cm, L 2.8cm)-->pt refused intervention.  . Blind right eye   . Carotid arterial disease ([place])    a. 12/2016 CTA Neck: RICA 100%. LICA >70%; b. 08/5007 s/p trans aortic LICA revascularization and stent placement [facility]); c. 03/2017 Carotid U/S [facility]): No significant LICA stenosis, patent stent.  Marland Kitchen COPD (chronic obstructive pulmonary disease) ([place])   . Diabetes mellitus without complication ([place])   . Diastolic dysfunction    a. 08/2016 Echo: EF 60%, Gr1 DD, mild MR, mildly dil LA/RV. No cardiac source of emboli.  . Femoral DVT (deep venous thrombosis) ([place])    a. 08/2016 L Fem Vein DVT ->xarelto/IVC filter. Xarelto subsequently d/c'd.  Marland Kitchen History of pulmonary embolism    a. 08/2016 following CVA-->chronic xarelto, s/p IVC filter. Xarelto subsequently d/c'd.  Marland Kitchen History of right MCA stroke    a. 08/2016 MRI: R MCA infarct w/ occlusion of RICA & R MCA.   Marland Kitchen Hypertension   . Tobacco abuse    HOSPITAL COURSE:  *Atypical chest pain *COPD without exacerbation *Chronic diabetes mellitus type 2 *History of PE/DVT-status post IVC filter placement *History of CVA with hemiparesis/bedbound  state *HTN. The patient has no chest pain or shortness of breath. Troponins are normal.  Stress test is normal.  Echo is unremarkable.  Ejection fraction 55 to 60%. Continue aspirin, Plavix and zetia.  Add Lipitor.  Continue Lopressor. DISCHARGE CONDITIONS:  Stable, discharge back to skilled nursing facility today. CONSULTS OBTAINED:  Treatment Team:  Minna Merritts, MD DRUG ALLERGIES:  No Known Allergies DISCHARGE MEDICATIONS:   Allergies as of 02/18/2018   No Known Allergies     Medication List    TAKE these medications   acetaminophen 325 MG tablet Commonly known as:  TYLENOL Take 650 mg by mouth every 8 (eight) hours as needed for mild pain or moderate pain.   aspirin 81 MG chewable tablet Chew 81 mg by mouth daily.   atorvastatin 40 MG tablet Commonly known as:  LIPITOR Take 1 tablet (40 mg total) by mouth daily at 6 PM.   bisacodyl 5 MG EC tablet Generic drug:  bisacodyl Take 10 mg by mouth daily as needed for moderate constipation.   budesonide-formoterol 80-4.5 MCG/ACT inhaler Commonly known as:  SYMBICORT Inhale 2 puffs into the lungs every 12 (twelve) hours.   clopidogrel 75 MG tablet Commonly known as:  PLAVIX Take 75 mg by mouth daily.   ezetimibe 10 MG tablet Commonly known as:  ZETIA Take 1 tablet (10 mg total) by mouth daily at 6 PM. What changed:  when to take this   famotidine 40 MG tablet Commonly known as:  PEPCID Take 1 tablet (40 mg total) by mouth 2 (  two) times daily. What changed:    how much to take  when to take this   fluticasone 50 MCG/ACT nasal spray Commonly known as:  FLONASE Place 1 spray into both nostrils daily.   HYDROcodone-acetaminophen 5-325 MG tablet Commonly known as:  NORCO/VICODIN Take 1 tablet by mouth daily as needed for moderate pain. What changed:    how much to take  how to take this  when to take this  additional instructions   insulin glargine 100 UNIT/ML injection Commonly known as:   LANTUS Inject 0.1 mLs (10 Units total) into the skin at bedtime.   levETIRAcetam 500 MG tablet Commonly known as:  KEPPRA Take 1 tablet (500 mg total) by mouth 2 (two) times daily.   lisinopril 20 MG tablet Commonly known as:  PRINIVIL,ZESTRIL Take 20 mg by mouth daily.   Melatonin 5 MG Tabs Take 1 tablet by mouth at bedtime as needed.   metoprolol tartrate 25 MG tablet Commonly known as:  LOPRESSOR Take 0.5 tablets (12.5 mg total) by mouth 2 (two) times daily.   polyethylene glycol packet Commonly known as:  MIRALAX / GLYCOLAX Take 17 g by mouth daily. MIX 17 GRAMS IN 4 TO 8 OUNCES WATER AND GIVE EVERY MORNING   sertraline 50 MG tablet Commonly known as:  ZOLOFT Take 50 mg by mouth daily.   tamsulosin 0.4 MG Caps capsule Commonly known as:  FLOMAX Take 1 capsule (0.4 mg total) by mouth daily after supper.   TGT SENNA LAXATIVE 8.6-50 MG tablet Generic drug:  senna-docusate Take 1 tablet by mouth 2 (two) times daily.   tiZANidine 2 MG tablet Commonly known as:  ZANAFLEX Take 1 tablet (2 mg total) by mouth at bedtime.            Durable Medical Equipment  (From admission, onward)         Start     Ordered   02/17/18 1201  For home use only DME continuous positive airway pressure (CPAP)  Once    Question Answer Comment  Patient has OSA or probable OSA Yes   Is the patient currently using CPAP in the home Yes   Settings 16-20   CPAP supplies needed Mask, headgear, cushions, filters, heated tubing and water chamber      02/17/18 1201           DISCHARGE INSTRUCTIONS:  See AVS.  If you experience worsening of your admission symptoms, develop shortness of breath, life threatening emergency, suicidal or homicidal thoughts you must seek medical attention immediately by calling 911 or calling your MD immediately  if symptoms less severe.  You Must read complete instructions/literature along with all the possible adverse reactions/side effects for all the  Medicines you take and that have been prescribed to you. Take any new Medicines after you have completely understood and accpet all the possible adverse reactions/side effects.   Please note  You were cared for by a hospitalist during your hospital stay. If you have any questions about your discharge medications or the care you received while you were in the hospital after you are discharged, you can call the unit and asked to speak with the hospitalist on call if the hospitalist that took care of you is not available. Once you are discharged, your primary care physician will handle any further medical issues. Please note that NO REFILLS for any discharge medications will be authorized once you are discharged, as it is imperative that you return to your primary  care physician (or establish a relationship with a primary care physician if you do not have one) for your aftercare needs so that they can reassess your need for medications and monitor your lab values.    On the day of Discharge:  VITAL SIGNS:  Blood pressure (!) 151/74, pulse (!) 56, temperature 98.4 F (36.9 C), temperature source Oral, resp. rate 18, height 5\' 9"  (1.753 m), weight 81.2 kg, SpO2 97 %. PHYSICAL EXAMINATION:  GENERAL:  62 y.o.-year-old patient lying in the bed with no acute distress.  EYES: Pupils equal, round, reactive to light and accommodation. No scleral icterus. Extraocular muscles intact.  HEENT: Head atraumatic, normocephalic. Oropharynx and nasopharynx clear.  NECK:  Supple, no jugular venous distention. No thyroid enlargement, no tenderness.  LUNGS: Normal breath sounds bilaterally, no wheezing, rales,rhonchi or crepitation. No use of accessory muscles of respiration.  CARDIOVASCULAR: S1, S2 normal. No murmurs, rubs, or gallops.  ABDOMEN: Soft, non-tender, non-distended. Bowel sounds present. No organomegaly or mass.  EXTREMITIES: No pedal edema, cyanosis, or clubbing.  NEUROLOGIC: Cranial nerves II through XII  are intact. Muscle strength 3/5 in all extremities. Sensation intact. Gait not checked.  PSYCHIATRIC: The patient is alert and oriented x 3.  SKIN: No obvious rash, lesion, or ulcer.  DATA REVIEW:   CBC Recent Labs  Lab 02/17/18 0943  WBC 9.9  HGB 16.5  HCT 47.3  PLT 216    Chemistries  Recent Labs  Lab 02/17/18 0943  NA 141  K 3.2*  CL 105  CO2 28  GLUCOSE 113*  BUN 11  CREATININE 0.87  CALCIUM 8.9  AST 25  ALT 31  ALKPHOS 71  BILITOT 1.3*     Microbiology Results  Results for orders placed or performed during the hospital encounter of 02/17/18  MRSA PCR Screening     Status: Abnormal   Collection Time: 02/17/18  7:48 PM  Result Value Ref Range Status   MRSA by PCR POSITIVE (A) NEGATIVE Final    Comment:        The GeneXpert MRSA Assay (FDA approved for NASAL specimens only), is one component of a comprehensive MRSA colonization surveillance program. It is not intended to diagnose MRSA infection nor to guide or monitor treatment for MRSA infections. RESULT CALLED TO, READ BACK BY AND VERIFIED WITH: KARA CAMPBELL AT 2158 02/17/18.PMH Performed at [facility], [address] [place], [place] [address]     RADIOLOGY:  No results found.   Management plans discussed with the patient, family and they are in agreement.  CODE STATUS: Full Code   TOTAL TIME TAKING CARE OF THIS PATIENT: 25 minutes.    Demetrios Loll M.D on 02/18/2018 at 2:18 PM  Between 7am to 6pm - Pager - [phone]  After 6pm go to [email or web address] - Proofreader  [facility]  Office  [phone]  CC: Primary care physician; Patient, No Pcp Per   Note: This dictation was prepared with Dragon dictation along with smaller phrase technology. Any transcriptional errors that result from this process are unintentional.

## 2018-02-18 NOTE — Clinical Social Work Note (Addendum)
CSW informed patient was observation and can return to [facility] this afternoon. CSW notified [facility] and spoke with Amy who stated patient could return. Discharge information was faxed. FL2 not required as patient was observation only. CSW attempted to reach family to notify of discharge. Patient's son, Hermelinda Dellen: [phone], was not reachable and phone went straight to voice mail and then said mailbox was full. CSW attempted to reach another contact: Sherryl Barters: [phone] but his phone went straight to voice mail and also said the mailbox was full and could not accept any messages. CSW attempted the 3rd contact: Wardell Heath: [phone] and was able to leave a voicemail.  Shela Leff MSW,LCSW [phone]

## 2018-02-21 LAB — GLUCOSE, CAPILLARY: Glucose-Capillary: 119 mg/dL — ABNORMAL HIGH (ref 70–99)

## 2018-08-02 ENCOUNTER — Other Ambulatory Visit: Payer: Self-pay | Admitting: Physician Assistant

## 2018-08-02 DIAGNOSIS — IMO0001 Reserved for inherently not codable concepts without codable children: Secondary | ICD-10-CM

## 2018-08-02 DIAGNOSIS — H9042 Sensorineural hearing loss, unilateral, left ear, with unrestricted hearing on the contralateral side: Secondary | ICD-10-CM

## 2018-08-14 ENCOUNTER — Ambulatory Visit
Admission: RE | Admit: 2018-08-14 | Discharge: 2018-08-14 | Disposition: A | Discharge status: Home / Self Care | Payer: Medicaid Other | Source: Ambulatory Visit | Attending: Physician Assistant | Admitting: Physician Assistant

## 2018-08-14 DIAGNOSIS — IMO0001 Reserved for inherently not codable concepts without codable children: Secondary | ICD-10-CM

## 2018-08-14 DIAGNOSIS — H9042 Sensorineural hearing loss, unilateral, left ear, with unrestricted hearing on the contralateral side: Secondary | ICD-10-CM | POA: Diagnosis not present

## 2018-08-14 LAB — POCT I-STAT CREATININE: Creatinine, Ser: 0.9 mg/dL (ref 0.61–1.24)

## 2018-08-14 MED ORDER — GADOBUTROL 1 MMOL/ML IV SOLN
8.0000 mL | Freq: Once | INTRAVENOUS | Status: AC | PRN
  Administered 2018-08-14: 8 mL via INTRAVENOUS

## 2020-01-24 ENCOUNTER — Encounter: Payer: Self-pay | Admitting: Radiology

## 2020-01-24 ENCOUNTER — Emergency Department: Payer: Medicare Other

## 2020-01-24 ENCOUNTER — Other Ambulatory Visit: Payer: Self-pay

## 2020-01-24 ENCOUNTER — Inpatient Hospital Stay
Admission: EM | Admit: 2020-01-24 | Discharge: 2020-01-30 | DRG: 357 | Disposition: A | Discharge status: Skilled Nursing Facility | Payer: Medicare Other | Source: Skilled Nursing Facility | Attending: Internal Medicine | Admitting: Internal Medicine

## 2020-01-24 DIAGNOSIS — Z7902 Long term (current) use of antithrombotics/antiplatelets: Secondary | ICD-10-CM

## 2020-01-24 DIAGNOSIS — I10 Essential (primary) hypertension: Secondary | ICD-10-CM | POA: Diagnosis present

## 2020-01-24 DIAGNOSIS — Z8249 Family history of ischemic heart disease and other diseases of the circulatory system: Secondary | ICD-10-CM

## 2020-01-24 DIAGNOSIS — Z7901 Long term (current) use of anticoagulants: Secondary | ICD-10-CM

## 2020-01-24 DIAGNOSIS — I69354 Hemiplegia and hemiparesis following cerebral infarction affecting left non-dominant side: Secondary | ICD-10-CM

## 2020-01-24 DIAGNOSIS — Z809 Family history of malignant neoplasm, unspecified: Secondary | ICD-10-CM

## 2020-01-24 DIAGNOSIS — I723 Aneurysm of iliac artery: Secondary | ICD-10-CM | POA: Diagnosis present

## 2020-01-24 DIAGNOSIS — E1169 Type 2 diabetes mellitus with other specified complication: Secondary | ICD-10-CM

## 2020-01-24 DIAGNOSIS — K922 Gastrointestinal hemorrhage, unspecified: Secondary | ICD-10-CM

## 2020-01-24 DIAGNOSIS — I6523 Occlusion and stenosis of bilateral carotid arteries: Secondary | ICD-10-CM | POA: Diagnosis present

## 2020-01-24 DIAGNOSIS — Z91030 Bee allergy status: Secondary | ICD-10-CM

## 2020-01-24 DIAGNOSIS — K921 Melena: Secondary | ICD-10-CM

## 2020-01-24 DIAGNOSIS — Z20822 Contact with and (suspected) exposure to covid-19: Secondary | ICD-10-CM | POA: Diagnosis present

## 2020-01-24 DIAGNOSIS — Z86711 Personal history of pulmonary embolism: Secondary | ICD-10-CM

## 2020-01-24 DIAGNOSIS — R110 Nausea: Secondary | ICD-10-CM | POA: Diagnosis not present

## 2020-01-24 DIAGNOSIS — I708 Atherosclerosis of other arteries: Secondary | ICD-10-CM | POA: Diagnosis present

## 2020-01-24 DIAGNOSIS — Z79899 Other long term (current) drug therapy: Secondary | ICD-10-CM

## 2020-01-24 DIAGNOSIS — K529 Noninfective gastroenteritis and colitis, unspecified: Principal | ICD-10-CM

## 2020-01-24 DIAGNOSIS — J449 Chronic obstructive pulmonary disease, unspecified: Secondary | ICD-10-CM | POA: Diagnosis present

## 2020-01-24 DIAGNOSIS — H5461 Unqualified visual loss, right eye, normal vision left eye: Secondary | ICD-10-CM | POA: Diagnosis present

## 2020-01-24 DIAGNOSIS — I714 Abdominal aortic aneurysm, without rupture, unspecified: Secondary | ICD-10-CM

## 2020-01-24 DIAGNOSIS — Z79891 Long term (current) use of opiate analgesic: Secondary | ICD-10-CM

## 2020-01-24 DIAGNOSIS — E785 Hyperlipidemia, unspecified: Secondary | ICD-10-CM | POA: Diagnosis present

## 2020-01-24 DIAGNOSIS — Z7982 Long term (current) use of aspirin: Secondary | ICD-10-CM

## 2020-01-24 DIAGNOSIS — F17210 Nicotine dependence, cigarettes, uncomplicated: Secondary | ICD-10-CM | POA: Diagnosis present

## 2020-01-24 DIAGNOSIS — G8194 Hemiplegia, unspecified affecting left nondominant side: Secondary | ICD-10-CM | POA: Diagnosis present

## 2020-01-24 DIAGNOSIS — Z794 Long term (current) use of insulin: Secondary | ICD-10-CM

## 2020-01-24 DIAGNOSIS — Z86718 Personal history of other venous thrombosis and embolism: Secondary | ICD-10-CM

## 2020-01-24 DIAGNOSIS — Z95828 Presence of other vascular implants and grafts: Secondary | ICD-10-CM

## 2020-01-24 DIAGNOSIS — J439 Emphysema, unspecified: Secondary | ICD-10-CM | POA: Diagnosis present

## 2020-01-24 DIAGNOSIS — Z7951 Long term (current) use of inhaled steroids: Secondary | ICD-10-CM

## 2020-01-24 LAB — COMPREHENSIVE METABOLIC PANEL
ALT: 26 U/L (ref 0–44)
AST: 29 U/L (ref 15–41)
Albumin: 3.7 g/dL (ref 3.5–5.0)
Alkaline Phosphatase: 65 U/L (ref 38–126)
Anion gap: 10 (ref 5–15)
BUN: 23 mg/dL (ref 8–23)
CO2: 24 mmol/L (ref 22–32)
Calcium: 9 mg/dL (ref 8.9–10.3)
Chloride: 107 mmol/L (ref 98–111)
Creatinine, Ser: 1.07 mg/dL (ref 0.61–1.24)
GFR calc Af Amer: >60 mL/min (ref >60)
GFR calc non Af Amer: >60 mL/min (ref >60)
Glucose, Bld: 164 mg/dL — ABNORMAL HIGH (ref 70–99)
Potassium: 3.9 mmol/L (ref 3.5–5.1)
Sodium: 141 mmol/L (ref 135–145)
Total Bilirubin: 1.1 mg/dL (ref 0.3–1.2)
Total Protein: 7.6 g/dL (ref 6.5–8.1)

## 2020-01-24 LAB — CBC
HCT: 45.6 % (ref 39.0–52.0)
Hemoglobin: 15.7 g/dL (ref 13.0–17.0)
MCH: 33.1 pg (ref 26.0–34.0)
MCHC: 34.4 g/dL (ref 30.0–36.0)
MCV: 96 fL (ref 80.0–100.0)
Platelets: 197 10*3/uL (ref 150–400)
RBC: 4.75 MIL/uL (ref 4.22–5.81)
RDW: 12.5 % (ref 11.5–15.5)
WBC: 16.9 10*3/uL — ABNORMAL HIGH (ref 4.0–10.5)
nRBC: 0 % (ref 0.0–0.2)

## 2020-01-24 LAB — TROPONIN I (HIGH SENSITIVITY): Troponin I (High Sensitivity): 6 ng/L (ref <18)

## 2020-01-24 LAB — LIPASE, BLOOD: Lipase: 29 U/L (ref 11–51)

## 2020-01-24 LAB — TYPE AND SCREEN
ABO/RH(D): O POS
Antibody Screen: NEGATIVE

## 2020-01-24 LAB — SARS CORONAVIRUS 2 BY RT PCR (HOSPITAL ORDER, PERFORMED IN ~~LOC~~ HOSPITAL LAB): SARS Coronavirus 2: NEGATIVE

## 2020-01-24 LAB — PROTIME-INR
INR: 1 (ref 0.8–1.2)
Prothrombin Time: 13.2 seconds (ref 11.4–15.2)

## 2020-01-24 LAB — LACTIC ACID, PLASMA: Lactic Acid, Venous: 1.6 mmol/L (ref 0.5–1.9)

## 2020-01-24 MED ORDER — METRONIDAZOLE IN NACL 5-0.79 MG/ML-% IV SOLN
500.0000 mg | Freq: Three times a day (TID) | INTRAVENOUS | Status: DC
  Administered 2020-01-25 – 2020-01-27 (×9): 500 mg via INTRAVENOUS
  Filled 2020-01-24 (×12): qty 100

## 2020-01-24 MED ORDER — LEVETIRACETAM 250 MG PO TABS
250.0000 mg | ORAL_TABLET | Freq: Two times a day (BID) | ORAL | Status: DC
  Administered 2020-01-25 – 2020-01-30 (×11): 250 mg via ORAL
  Filled 2020-01-24 (×14): qty 1

## 2020-01-24 MED ORDER — ONDANSETRON HCL 4 MG PO TABS: 4.0000 mg | ORAL_TABLET | Freq: Four times a day (QID) | ORAL | Status: DC | PRN

## 2020-01-24 MED ORDER — ONDANSETRON HCL 4 MG/2ML IJ SOLN
4.0000 mg | Freq: Four times a day (QID) | INTRAMUSCULAR | Status: DC | PRN
  Administered 2020-01-29 (×3): 4 mg via INTRAVENOUS
  Filled 2020-01-24: qty 2

## 2020-01-24 MED ORDER — DIVALPROEX SODIUM 125 MG PO DR TAB
125.0000 mg | DELAYED_RELEASE_TABLET | Freq: Every day | ORAL | Status: DC
  Administered 2020-01-25 – 2020-01-29 (×6): 125 mg via ORAL
  Filled 2020-01-24 (×7): qty 1

## 2020-01-24 MED ORDER — TAMSULOSIN HCL 0.4 MG PO CAPS
0.4000 mg | ORAL_CAPSULE | Freq: Every day | ORAL | Status: DC
  Administered 2020-01-25 – 2020-01-29 (×5): 0.4 mg via ORAL
  Filled 2020-01-24 (×7): qty 1

## 2020-01-24 MED ORDER — CIPROFLOXACIN IN D5W 400 MG/200ML IV SOLN
400.0000 mg | Freq: Once | INTRAVENOUS | Status: AC
  Administered 2020-01-24: 400 mg via INTRAVENOUS
  Filled 2020-01-24: qty 200

## 2020-01-24 MED ORDER — LACTATED RINGERS IV BOLUS
1000.0000 mL | Freq: Once | INTRAVENOUS | Status: AC
  Administered 2020-01-24: 1000 mL via INTRAVENOUS

## 2020-01-24 MED ORDER — GABAPENTIN 300 MG PO CAPS
300.0000 mg | ORAL_CAPSULE | Freq: Every day | ORAL | Status: DC
  Administered 2020-01-25 – 2020-01-30 (×6): 300 mg via ORAL
  Filled 2020-01-24 (×5): qty 1

## 2020-01-24 MED ORDER — MORPHINE SULFATE (PF) 2 MG/ML IV SOLN
2.0000 mg | INTRAVENOUS | Status: DC | PRN
  Administered 2020-01-29: 2 mg via INTRAVENOUS
  Filled 2020-01-24: qty 1

## 2020-01-24 MED ORDER — HYDROCODONE-ACETAMINOPHEN 5-325 MG PO TABS
1.0000 | ORAL_TABLET | Freq: Every day | ORAL | Status: DC | PRN
  Administered 2020-01-27: 1 via ORAL
  Filled 2020-01-24 (×2): qty 1

## 2020-01-24 MED ORDER — LISINOPRIL 20 MG PO TABS
40.0000 mg | ORAL_TABLET | Freq: Every day | ORAL | Status: DC
  Administered 2020-01-25 – 2020-01-30 (×6): 40 mg via ORAL
  Filled 2020-01-24 (×2): qty 2
  Filled 2020-01-24: qty 4
  Filled 2020-01-24 (×4): qty 2

## 2020-01-24 MED ORDER — AMLODIPINE BESYLATE 5 MG PO TABS
5.0000 mg | ORAL_TABLET | Freq: Every day | ORAL | Status: DC
  Administered 2020-01-25 – 2020-01-30 (×6): 5 mg via ORAL
  Filled 2020-01-24 (×6): qty 1

## 2020-01-24 MED ORDER — ACETAMINOPHEN 650 MG RE SUPP: 650.0000 mg | Freq: Four times a day (QID) | RECTAL | Status: DC | PRN

## 2020-01-24 MED ORDER — PANTOPRAZOLE SODIUM 40 MG IV SOLR
40.0000 mg | Freq: Once | INTRAVENOUS | Status: AC
  Administered 2020-01-24: 40 mg via INTRAVENOUS
  Filled 2020-01-24: qty 40

## 2020-01-24 MED ORDER — INSULIN ASPART 100 UNIT/ML ~~LOC~~ SOLN: 0.0000 [IU] | Freq: Every day | SUBCUTANEOUS | Status: DC

## 2020-01-24 MED ORDER — METRONIDAZOLE IN NACL 5-0.79 MG/ML-% IV SOLN
500.0000 mg | Freq: Once | INTRAVENOUS | Status: AC
  Administered 2020-01-25: 500 mg via INTRAVENOUS
  Filled 2020-01-24: qty 100

## 2020-01-24 MED ORDER — CIPROFLOXACIN IN D5W 400 MG/200ML IV SOLN
400.0000 mg | Freq: Two times a day (BID) | INTRAVENOUS | Status: DC
  Administered 2020-01-25 – 2020-01-27 (×6): 400 mg via INTRAVENOUS
  Filled 2020-01-24 (×8): qty 200

## 2020-01-24 MED ORDER — POTASSIUM CHLORIDE CRYS ER 20 MEQ PO TBCR
20.0000 meq | EXTENDED_RELEASE_TABLET | Freq: Two times a day (BID) | ORAL | Status: DC
  Administered 2020-01-25 – 2020-01-30 (×11): 20 meq via ORAL
  Filled 2020-01-24 (×11): qty 1

## 2020-01-24 MED ORDER — IOHEXOL 350 MG/ML SOLN
100.0000 mL | Freq: Once | INTRAVENOUS | Status: AC | PRN
  Administered 2020-01-24: 100 mL via INTRAVENOUS

## 2020-01-24 MED ORDER — ASPIRIN 81 MG PO CHEW: 81.0000 mg | CHEWABLE_TABLET | Freq: Every day | ORAL | Status: DC

## 2020-01-24 MED ORDER — PANTOPRAZOLE SODIUM 40 MG IV SOLR
40.0000 mg | Freq: Every day | INTRAVENOUS | Status: DC
  Administered 2020-01-25 – 2020-01-29 (×6): 40 mg via INTRAVENOUS
  Filled 2020-01-24 (×9): qty 40

## 2020-01-24 MED ORDER — INSULIN ASPART 100 UNIT/ML ~~LOC~~ SOLN
0.0000 [IU] | Freq: Three times a day (TID) | SUBCUTANEOUS | Status: DC
  Administered 2020-01-25 – 2020-01-28 (×7): 1 [IU] via SUBCUTANEOUS
  Administered 2020-01-28: 2 [IU] via SUBCUTANEOUS
  Administered 2020-01-29: 3 [IU] via SUBCUTANEOUS
  Administered 2020-01-30 (×3): 1 [IU] via SUBCUTANEOUS
  Filled 2020-01-24 (×8): qty 1

## 2020-01-24 MED ORDER — ONDANSETRON HCL 4 MG/2ML IJ SOLN
4.0000 mg | Freq: Once | INTRAMUSCULAR | Status: AC
  Administered 2020-01-24: 4 mg via INTRAVENOUS
  Filled 2020-01-24: qty 2

## 2020-01-24 MED ORDER — SODIUM CHLORIDE 0.9 % IV SOLN: INTRAVENOUS | Status: DC

## 2020-01-24 MED ORDER — METOPROLOL TARTRATE 25 MG PO TABS
12.5000 mg | ORAL_TABLET | Freq: Two times a day (BID) | ORAL | Status: DC
  Administered 2020-01-25 – 2020-01-30 (×11): 12.5 mg via ORAL
  Filled 2020-01-24 (×5): qty 1
  Filled 2020-01-24: qty 0.5
  Filled 2020-01-24: qty 1
  Filled 2020-01-24: qty 0.5
  Filled 2020-01-24 (×3): qty 1

## 2020-01-24 MED ORDER — ACETAMINOPHEN 325 MG PO TABS
650.0000 mg | ORAL_TABLET | Freq: Four times a day (QID) | ORAL | Status: DC | PRN
  Administered 2020-01-26: 650 mg via ORAL
  Filled 2020-01-24: qty 2

## 2020-01-24 MED ORDER — TIZANIDINE HCL 2 MG PO TABS
2.0000 mg | ORAL_TABLET | Freq: Every day | ORAL | Status: DC
  Administered 2020-01-25: 2 mg via ORAL
  Filled 2020-01-24 (×2): qty 1

## 2020-01-24 MED ORDER — ENOXAPARIN SODIUM 40 MG/0.4ML ~~LOC~~ SOLN
40.0000 mg | SUBCUTANEOUS | Status: DC
  Administered 2020-01-25 – 2020-01-30 (×5): 40 mg via SUBCUTANEOUS
  Filled 2020-01-24 (×5): qty 0.4

## 2020-01-24 MED ORDER — ATORVASTATIN CALCIUM 20 MG PO TABS
20.0000 mg | ORAL_TABLET | Freq: Every day | ORAL | Status: DC
  Administered 2020-01-25 – 2020-01-30 (×6): 20 mg via ORAL
  Filled 2020-01-24 (×7): qty 1

## 2020-01-24 NOTE — H&P (Signed)
History and Physical    Chad Combs CHE:[id number] DOB: [birth year] DOA: 01/24/2020  PCP: Alvester Morin, MD   Patient coming from: SNF  I have personally briefly reviewed patient's old medical records in [place]  Chief Complaint: Blood in stool x1 day, abdominal pain  HPI: Chad Combs is a 64 y.o. male with medical history significant for DM, HTN, COPD, AAA, CVA with left hemiplegia, who presents to the emergency room with a complaint of loose bowel movements with blood noted today by nursing home staff.  Patient reports a 1 month history of diffuse abdominal pain of mild to moderate intensity when eating without associated nausea, vomiting, fever or chills.  He denies cough, chest pain or shortness of breath.  No prior history of rectal bleeding and denies rectal pain or hemorrhoids ED Course: On arrival he was hemodynamically stable.  Blood work significant for WBC of 16,000, hemoglobin 15.7, lactic acid normal at 1.6, lipase 29, chemistries normal.CT angiogram abdomen and pelvis showed findings consistent with diffuse colitis infrarenal AAA measuring up to 7.3 cm as well as bilateral common iliac artery aneurysmal dilatation of 3.5 and 4.7 cm respectively, subacute compression deformities T12 and L2, mild bilateral perinephric stranding.  The emergency room provider spoke with vascular surgeon Dr. Franchot Gallo who advised that they will place stent once acute colitis is treated.  Patient was started on IV Flagyl and Cipro and hospitalist consulted for admission.  Review of Systems: As per HPI otherwise all other systems on review of systems negative.    Past Medical History:  Diagnosis Date  . AAA (abdominal aortic aneurysm) ([place])    a. 08/2016 CTA: 3.1cm saccular aneurysm of distal Ao arch w/ penetrating atheromatous ulcer. 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 09/2017 CTA chest/abd/pelvis [facility]): 6.3cm  infrarenal AAA, unchanged bilateral CIA aneurysms (R 3.5cm, L 2.8cm)-->pt refused intervention.  . Blind right eye   . Carotid arterial disease ([place])    a. 12/2016 CTA Neck: RICA 100%. LICA >53%; b. 11/1441 s/p trans aortic LICA revascularization and stent placement [facility]); c. 03/2017 Carotid U/S [facility]): No significant LICA stenosis, patent stent.  Combs Kitchen COPD (chronic obstructive pulmonary disease) ([place])   . Diabetes mellitus without complication ([place])   . Diastolic dysfunction    a. 08/2016 Echo: EF 60%, Gr1 DD, mild MR, mildly dil LA/RV. No cardiac source of emboli.  . Femoral DVT (deep venous thrombosis) ([place])    a. 08/2016 L Fem Vein DVT ->xarelto/IVC filter. Xarelto subsequently d/c'd.  Combs Kitchen History of pulmonary embolism    a. 08/2016 following CVA-->chronic xarelto, s/p IVC filter. Xarelto subsequently d/c'd.  Combs Kitchen History of right MCA stroke    a. 08/2016 MRI: R MCA infarct w/ occlusion of RICA & R MCA.   Combs Kitchen Hypertension   . Tobacco abuse     Past Surgical History:  Procedure Laterality Date  . IVC FILTER INSERTION N/A 08/24/2016   Procedure: IVC Filter Insertion;  Surgeon: Katha Cabal, MD;  Location: [facility];  Service: Cardiovascular;  Laterality: N/A;     reports that he has been smoking cigarettes. He has a 67.50 pack-year smoking history. He has never used smokeless tobacco. He reports current alcohol use. He reports that he does not use drugs.  Allergies  Allergen Reactions  . Bee Pollen Hives and Swelling    Family History  Problem Relation Age of Onset  . Cancer Mother   .  Heart disease Father       Prior to Admission medications   Medication Sig Start Date End Date Taking? Authorizing Provider  amLODipine (NORVASC) 5 MG tablet Take 5 mg by mouth daily. 01/24/20  Yes [provider]  aspirin 81 MG chewable tablet Chew 81 mg by mouth daily.   Yes [provider]  atorvastatin (LIPITOR) 20 MG tablet Take 20 mg by mouth daily. 01/24/20  Yes [provider]  budesonide-formoterol (SYMBICORT) 80-4.5 MCG/ACT inhaler Inhale 2 puffs into the lungs every 12 (twelve) hours.   Yes [provider]  clopidogrel (PLAVIX) 75 MG tablet Take 75 mg by mouth daily.   Yes [provider]  divalproex (DEPAKOTE) 125 MG DR tablet Take 125 mg by mouth at bedtime. 01/24/20  Yes [provider]  ezetimibe (ZETIA) 10 MG tablet Take 1 tablet (10 mg total) by mouth daily at 6 PM. Patient taking differently: Take 10 mg by mouth daily.  10/01/16  Yes Love, Ivan Anchors, PA-C  gabapentin (NEURONTIN) 300 MG capsule Take 300 mg by mouth daily. 01/24/20  Yes [provider]  HYDROcodone-acetaminophen (NORCO/VICODIN) 5-325 MG tablet Take 1 tablet by mouth daily as needed for moderate pain. Patient taking differently: See admin instructions. Take 1 tablet by mouth every 12 hours as scheduled and 1 tablet by mouth daily as needed for pain 05/22/17  Yes Gladstone Lighter, MD  LEVEMIR FLEXTOUCH 100 UNIT/ML FlexPen Inject 24 Units into the skin at bedtime. 01/21/20  Yes [provider]  levETIRAcetam (KEPPRA) 250 MG tablet Take 250 mg by mouth 2 (two) times daily. 01/24/20  Yes [provider]  lisinopril (ZESTRIL) 40 MG tablet Take 40 mg by mouth daily. 01/24/20  Yes [provider]  metoprolol tartrate (LOPRESSOR) 25 MG tablet Take 0.5 tablets (12.5 mg total) by mouth 2 (two) times daily. 10/06/16  Yes Hendricks Limes, MD  ondansetron (ZOFRAN) 4 MG tablet Take 4 mg by mouth at bedtime. 01/22/20  Yes [provider]  pantoprazole (PROTONIX) 40 MG tablet Take 40 mg by mouth 2 (two) times daily.  01/24/20  Yes [provider]  polyethylene glycol (MIRALAX / GLYCOLAX) 17 g packet Take 17 g by mouth daily.   Yes [provider]  potassium chloride SA (KLOR-CON) 20 MEQ tablet Take 20 mEq by mouth 2 (two) times daily. 01/23/20  Yes [provider]  senna (SENOKOT) 8.6 MG TABS tablet Take 2 tablets by  mouth at bedtime.   Yes [provider]  sertraline (ZOLOFT) 100 MG tablet Take 100 mg by mouth daily. 01/24/20  Yes [provider]  simethicone (MYLICON) 716 MG chewable tablet Chew 125 mg by mouth 3 (three) times daily.   Yes [provider]  tamsulosin (FLOMAX) 0.4 MG CAPS capsule Take 1 capsule (0.4 mg total) by mouth daily after supper. 10/01/16  Yes Love, Ivan Anchors, PA-C  tiZANidine (ZANAFLEX) 2 MG tablet Take 1 tablet (2 mg total) by mouth at bedtime. 10/01/16  Yes Love, Ivan Anchors, PA-C  OZEMPIC, 0.25 OR 0.5 MG/DOSE, 2 MG/1.5ML SOPN Inject 0.5 mg into the skin once a week. Friday 01/04/20   [provider]    Physical Exam: Vitals:   01/24/20 1923 01/24/20 2000 01/24/20 2030 01/24/20 2100  BP:  (!) 134/92 (!) 142/108 (!) 144/85  Pulse:  79 77 71  Resp:  20 15 16   Temp: 98.1 F (36.7 C)     SpO2:  95% 94% 94%  Weight:  Height:         Vitals:   01/24/20 1923 01/24/20 2000 01/24/20 2030 01/24/20 2100  BP:  (!) 134/92 (!) 142/108 (!) 144/85  Pulse:  79 77 71  Resp:  20 15 16   Temp: 98.1 F (36.7 C)     SpO2:  95% 94% 94%  Weight:      Height:          Constitutional: Alert and oriented x 3 . Not in any apparent distress HEENT:      Head: Normocephalic and atraumatic.         Eyes: PERLA, EOMI, Conjunctivae are normal. Sclera is non-icteric.       Mouth/Throat: Mucous membranes are moist.       Neck: Supple with no signs of meningismus. Cardiovascular: Regular rate and rhythm. No murmurs, gallops, or rubs. 2+ symmetrical distal pulses are present . No JVD. No LE edema Respiratory: Respiratory effort normal .Lungs sounds clear bilaterally. No wheezes, crackles, or rhonchi.  Gastrointestinal: Soft, tender in LLQ, and non distended with positive bowel sounds. No rebound or guarding. Genitourinary: No CVA tenderness. Musculoskeletal:  Left hemiplegia. No cyanosis, or erythema of extremities. Neurologic: Normal speech and language.  Left  facial droop with left hemiplegia  Skin: Skin is warm, dry.  No rash or ulcers Psychiatric: Mood and affect are normal Speech and behavior are normal   Labs on Admission: I have personally reviewed following labs and imaging studies  CBC: Recent Labs  Lab 01/24/20 2043  WBC 16.9*  HGB 15.7  HCT 45.6  MCV 96.0  PLT 426   Basic Metabolic Panel: Recent Labs  Lab 01/24/20 2043  NA 141  K 3.9  CL 107  CO2 24  GLUCOSE 164*  BUN 23  CREATININE 1.07  CALCIUM 9.0   GFR: Estimated Creatinine Clearance: 69.7 mL/min (by C-G formula based on SCr of 1.07 mg/dL). Liver Function Tests: Recent Labs  Lab 01/24/20 2043  AST 29  ALT 26  ALKPHOS 65  BILITOT 1.1  PROT 7.6  ALBUMIN 3.7   Recent Labs  Lab 01/24/20 2043  LIPASE 29   No results for input(s): AMMONIA in the last 168 hours. Coagulation Profile: Recent Labs  Lab 01/24/20 2043  INR 1.0   Cardiac Enzymes: No results for input(s): CKTOTAL, CKMB, CKMBINDEX, TROPONINI in the last 168 hours. BNP (last 3 results) No results for input(s): PROBNP in the last 8760 hours. HbA1C: No results for input(s): HGBA1C in the last 72 hours. CBG: No results for input(s): GLUCAP in the last 168 hours. Lipid Profile: No results for input(s): CHOL, HDL, LDLCALC, TRIG, CHOLHDL, LDLDIRECT in the last 72 hours. Thyroid Function Tests: No results for input(s): TSH, T4TOTAL, FREET4, T3FREE, THYROIDAB in the last 72 hours. Anemia Panel: No results for input(s): VITAMINB12, FOLATE, FERRITIN, TIBC, IRON, RETICCTPCT in the last 72 hours. Urine analysis:    Component Value Date/Time   COLORURINE YELLOW (A) 08/14/2017 1458   APPEARANCEUR CLEAR (A) 08/14/2017 1458   LABSPEC 1.016 08/14/2017 1458   PHURINE 5.0 08/14/2017 1458   GLUCOSEU NEGATIVE 08/14/2017 1458   HGBUR SMALL (A) 08/14/2017 1458   BILIRUBINUR NEGATIVE 08/14/2017 1458   KETONESUR NEGATIVE 08/14/2017 1458   PROTEINUR 100 (A) 08/14/2017 1458   NITRITE NEGATIVE 08/14/2017  1458   LEUKOCYTESUR NEGATIVE 08/14/2017 1458    Radiological Exams on Admission: No results found.  EKG: Independently reviewed. Interpretation : Normal sinus rhythm with no acute ST-T wave changes Assessment/Plan 64 year old male with history  of DM, HTN, COPD, AAA, CVA with left hemiplegia, who presents to the emergency room with a complaint of bloody bowel movements mixed with blood, diffuse abdominal pain.  CT showing diffuse colitis.  Also shows increased size of known AAA.    Acute colitis, suspect infectious   Hematochezia -CTA abdomen and pelvis showing diffuse colitis.  WBC 17,000 -Clear liquid diet -IV Flagyl and Cipro as well as IV Protonix -Hemoglobin robust at 16.  Continue to monitor -IV hydration and pain control  Infrarenal AAA 7.3 cm Bilateral iliac artery aneurysmal dilatation -Vascular surgery consulted with plan for endovascular stent after 5 days of antibiotics.  Please refer to the official note  History of CVA with residual left hemiplegia (HCC) -Continue antiplatelets and statins -Increase nursing assistance    Benign essential HTN -Continue home meds pending med rec    Chronic obstructive pulmonary disease ([place]) -Continue home inhalers.  DuoNebs as needed    Type 2 diabetes mellitus with hyperlipidemia (HCC) -Sliding scale insulin coverage pending med rec    DVT prophylaxis: Lovenox  Code Status: full code  Family Communication:  none  Disposition Plan: Back to previous home environment Consults called: Vascular, Dr. Franchot Gallo Status: Observation    Athena Masse MD [facility]     01/24/2020, 11:30 PM

## 2020-01-24 NOTE — ED Triage Notes (Signed)
Pt BIB EMS for nausea x4 weeks with some vomiting. Pt states diarrhea with rectal bleeding started today. Left sided deficits from previous stroke.

## 2020-01-24 NOTE — ED Provider Notes (Signed)
-----------------------------------------   8:18 PM on 01/24/2020 -----------------------------------------  Blood pressure (!) 153/109, pulse 88, temperature 98.1 F (36.7 C), resp. rate 18, height 5\' 9"  (1.753 m), weight 80.3 kg, SpO2 95 %.  Assuming care from Dr. Jacqualine Code.  In short, Chad Combs is a 64 y.o. male with a chief complaint of Nausea .  Refer to the original H&P for additional details.  Patient presenting with pain and vomiting for about the past month, has history of stroke with left sided paralysis. The current plan of care is to follow-up labs and CT abdomen/pelvis.  ----------------------------------------- 11:22 PM on 01/24/2020 -----------------------------------------  Patient remains hemodynamically stable and has had no further bleeding at this time, although he continues to complain of diffuse abdominal pain.  CTA abdomen/pelvis shows thickening of his rectosigmoid colon consistent with a colitis.  Lactate was added and is within normal limits, low suspicion of ischemic colitis and infectious colitis seems more likely given elevated white count.  Case discussed with Dr. Delana Kinley of vascular surgery, who recommends starting patient on a 5-day course of antibiotics, after which they will consider stenting of his AAA.  Case discussed with hospitalist for admission.    Blake Divine, MD 01/24/20 [phone]

## 2020-01-24 NOTE — ED Notes (Addendum)
Pt states son is on his way in. Pt states diarrhea times 2 episodes today. Pt states he did not see blood, but that the CNA/RN at the facility noted blood.  Pt is on cardiac, bp and pulse ox monitor

## 2020-01-24 NOTE — ED Provider Notes (Signed)
[facility] Department Provider Note ____________________________________________   First MD Initiated Contact with Patient 01/24/20 2018     (approximate)  I have reviewed the triage vital signs and the nursing notes.   HISTORY  Chief Complaint Nausea    HPI Chad Combs is a 64 y.o. male here for evaluation of blood in his stool  Patient reports for about a month now has been experiencing upper abdominal pain anytime he eats food.  This is held true for a month and is not worsening, however today he had a couple loose bowel movements and was told that there was blood in it by staff.  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  He has been fully vaccinated against Covid and reports that he survived that pandemic that actually killed 40 people at his facility  No fevers or chills.  No nausea or vomiting.  No chest pain or trouble breathing.  Denies any history of bleeding problems in the past.  Used to take a blood thinner for a previous blood clot but has been off that for about a year and a half.  Now reports taking aspirin daily  He is currently not in pain but does report some ongoing nausea which she reports is present for a month but concerned that blood was noted in his stool  Note the patient tells me that he does not want anything placed into his rectum including an examination   Past Medical History:  Diagnosis Date  . AAA (abdominal aortic aneurysm) ([place])    a. 08/2016 CTA: 3.1cm saccular aneurysm of distal Ao arch w/ penetrating atheromatous ulcer. 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 09/2017 CTA chest/abd/pelvis [facility]): 6.3cm infrarenal AAA, unchanged bilateral CIA aneurysms (R 3.5cm, L 2.8cm)-->pt refused intervention.  . Blind right eye   . Carotid arterial disease ([place])    a. 12/2016  CTA Neck: RICA 100%. LICA >16%; b. 06/958 s/p trans aortic LICA revascularization and stent placement [facility]); c. 03/2017 Carotid U/[facility]): No significant LICA stenosis, patent stent.  Marland Kitchen COPD (chronic obstructive pulmonary disease) ([place])   . Diabetes mellitus without complication ([place])   . Diastolic dysfunction    a. 08/2016 Echo: EF 60%, Gr1 DD, mild MR, mildly dil LA/RV. No cardiac source of emboli.  . Femoral DVT (deep venous thrombosis) ([place])    a. 08/2016 L Fem Vein DVT ->xarelto/IVC filter. Xarelto subsequently d/c'd.  Marland Kitchen History of pulmonary embolism    a. 08/2016 following CVA-->chronic xarelto, s/p IVC filter. Xarelto subsequently d/c'd.  Marland Kitchen History of right MCA stroke    a. 08/2016 MRI: R MCA infarct w/ occlusion of RICA & R MCA.   Marland Kitchen Hypertension   . Tobacco abuse     Patient Active Problem List   Diagnosis Date Noted  . Chest pain 02/17/2018  . UTI (urinary tract infection) 05/19/2017  . Seizure-like activity ([place]) 05/19/2017  . Hyperlipidemia 11/08/2016  . Type II diabetes mellitus with neurological manifestations, uncontrolled ([place]) 10/04/2016  . Dyslipidemia associated with type 2 diabetes mellitus ([place]) 10/04/2016  . AAA (abdominal aortic aneurysm) without rupture ([place])   . Acute deep vein thrombosis (DVT) of femoral vein of left lower extremity ([place])   . Pulmonary embolus ([place])   . Adjustment disorder   . Acute right MCA stroke ([place]) 08/25/2016  . Left hemiplegia ([place])   .  Dysarthria, post-stroke   . Dysphagia, post-stroke   . Carotid stenosis   . Benign essential HTN   . Leukocytosis   . Polycythemia vera ([place])   . Tobacco abuse   . Chronic obstructive pulmonary disease ([place])   . 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    Past Surgical History:  Procedure Laterality Date  . IVC FILTER INSERTION N/A 08/24/2016   Procedure: IVC Filter Insertion;  Surgeon: Katha Cabal, MD;  Location: [facility];  Service: Cardiovascular;  Laterality: N/A;    Prior to Admission  medications   Medication Sig Start Date End Date Taking? Authorizing Provider  acetaminophen (TYLENOL) 325 MG tablet Take 650 mg by mouth every 8 (eight) hours as needed for mild pain or moderate pain.    [provider]  aspirin 81 MG chewable tablet Chew 81 mg by mouth daily.    [provider]  atorvastatin (LIPITOR) 40 MG tablet Take 1 tablet (40 mg total) by mouth daily at 6 PM. 02/18/18   Demetrios Loll, MD  bisacodyl (BISACODYL) 5 MG EC tablet Take 10 mg by mouth daily as needed for moderate constipation.    [provider]  budesonide-formoterol (SYMBICORT) 80-4.5 MCG/ACT inhaler Inhale 2 puffs into the lungs every 12 (twelve) hours.    [provider]  clopidogrel (PLAVIX) 75 MG tablet Take 75 mg by mouth daily.    [provider]  ezetimibe (ZETIA) 10 MG tablet Take 1 tablet (10 mg total) by mouth daily at 6 PM. Patient taking differently: Take 10 mg by mouth daily.  10/01/16   Love, Ivan Anchors, PA-C  famotidine (PEPCID) 40 MG tablet Take 1 tablet (40 mg total) by mouth 2 (two) times daily. Patient taking differently: Take 20 mg by mouth daily.  10/01/16   Love, Ivan Anchors, PA-C  fluticasone (FLONASE) 50 MCG/ACT nasal spray Place 1 spray into both nostrils daily. 08/25/16   Max Sane, MD  HYDROcodone-acetaminophen (NORCO/VICODIN) 5-325 MG tablet Take 1 tablet by mouth daily as needed for moderate pain. Patient taking differently: See admin instructions. Take 1 tablet by mouth every 12 hours as scheduled and 1 tablet by mouth daily as needed for pain 05/22/17   Gladstone Lighter, MD  insulin glargine (LANTUS) 100 UNIT/ML injection Inject 0.1 mLs (10 Units total) into the skin at bedtime. 05/22/17   Gladstone Lighter, MD  levETIRAcetam (KEPPRA) 500 MG tablet Take 1 tablet (500 mg total) by mouth 2 (two) times daily. 08/14/17   Harvest Dark, MD  lisinopril (PRINIVIL,ZESTRIL) 20 MG tablet Take 20 mg by mouth daily.    [provider]  Melatonin  5 MG TABS Take 1 tablet by mouth at bedtime as needed.    [provider]  metoprolol tartrate (LOPRESSOR) 25 MG tablet Take 0.5 tablets (12.5 mg total) by mouth 2 (two) times daily. 10/06/16   Hendricks Limes, MD  polyethylene glycol [facility] / Floria Raveling) packet Take 17 g by mouth daily. MIX 17 GRAMS IN 4 TO 8 OUNCES WATER AND GIVE EVERY MORNING    [provider]  senna-docusate (TGT SENNA LAXATIVE) 8.6-50 MG tablet Take 1 tablet by mouth 2 (two) times daily.    [provider]  sertraline (ZOLOFT) 50 MG tablet Take 50 mg by mouth daily.    [provider]  tamsulosin (FLOMAX) 0.4 MG CAPS capsule Take 1 capsule (0.4 mg total) by mouth daily after supper. 10/01/16   Love, Ivan Anchors, PA-C  tiZANidine (ZANAFLEX) 2 MG tablet Take  1 tablet (2 mg total) by mouth at bedtime. 10/01/16   Love, Ivan Anchors, PA-C    Allergies Bee pollen  Family History  Problem Relation Age of Onset  . Cancer Mother   . Heart disease Father     Social History Social History   Tobacco Use  . Smoking status: Current Some Day Smoker    Packs/day: 1.50    Years: 45.00    Pack years: 67.50    Types: Cigarettes  . Smokeless tobacco: Never Used  Vaping Use  . Vaping Use: Never used  Substance Use Topics  . Alcohol use: Yes    Alcohol/week: 0.0 standard drinks    Comment: occasional (once every couple months)  . Drug use: No    Review of Systems Constitutional: No fever/chills Eyes: No visual changes. ENT: No sore throat. Cardiovascular: Denies chest pain. Respiratory: Denies shortness of breath. Gastrointestinal: See HPI.  Currently on experiencing nausea Genitourinary: Negative for dysuria. Musculoskeletal: Negative for back pain. Skin: Negative for rash. Neurological: Negative for headaches, areas of focal weakness or numbness.    ____________________________________________   PHYSICAL EXAM:  VITAL SIGNS: ED Triage Vitals  Enc Vitals Group     BP 01/24/20  1920 (!) 153/109     Pulse Rate 01/24/20 1920 88     Resp 01/24/20 1920 18     Temp 01/24/20 1923 98.1 F (36.7 C)     Temp src --      SpO2 01/24/20 1920 95 %     Weight 01/24/20 1921 177 lb (80.3 kg)     Height 01/24/20 1921 5\' 9"  (1.753 m)     Head Circumference --      Peak Flow --      Pain Score 01/24/20 1921 0     Pain Loc --      Pain Edu? --      Excl. in [place]? --     Constitutional: Alert and oriented. Well appearing and in no acute distress.  Paralysis with some flexion contractures left side Eyes: Conjunctivae are normal. Head: Atraumatic. Nose: No congestion/rhinnorhea. Mouth/Throat: Mucous membranes are moist. Neck: No stridor.  Cardiovascular: Normal rate, regular rhythm. Grossly normal heart sounds.  Good peripheral circulation. Respiratory: Normal respiratory effort.  No retractions. Lungs CTAB. Gastrointestinal: Soft and reports mild tenderness to palpation throughout, no rebound or guarding and no distention. No distention.  Patient refused rectal exam, does allow evaluation of the buttock region which does demonstrate stool mixed with frank blood. Musculoskeletal: No lower extremity tenderness nor edema. Neurologic:  Normal speech and language. No gross focal neurologic deficits are appreciated.  Skin:  Skin is warm, dry and intact. No rash noted. Psychiatric: Mood and affect are normal. Speech and behavior are normal.  ____________________________________________   LABS (all labs ordered are listed, but only abnormal results are displayed)  Labs Reviewed  SARS CORONAVIRUS 2 BY RT PCR (HOSPITAL ORDER, [place] [facility])  CBC  COMPREHENSIVE METABOLIC PANEL  LIPASE, BLOOD  PROTIME-INR  SAMPLE TO BLOOD BANK  SAMPLE TO BLOOD BANK  TYPE AND SCREEN  TROPONIN I (HIGH SENSITIVITY)   ____________________________________________  EKG  Reviewed interpreted at [place] rate 85 QRS 99 QTc 450 Normal sinus rhythm single PVC.  No evidence  of ischemia or ectopy ____________________________________________  RADIOLOGY  CT imaging abdomen pelvis pending at time of signout ____________________________________________   PROCEDURES  Procedure(s) performed: None  Procedures  Critical Care performed: No  ____________________________________________   INITIAL IMPRESSION /  ASSESSMENT AND PLAN / ED COURSE  Pertinent labs & imaging results that were available during my care of the patient were reviewed by me and considered in my medical decision making (see chart for details).   Differential diagnosis includes but is not limited to, abdominal perforation, aortic dissection, cholecystitis, appendicitis, diverticulitis, colitis, esophagitis/gastritis, kidney stone, pyelonephritis, urinary tract infection, aortic aneurysm. All are considered in decision and treatment plan. Based upon the patient's presentation and risk factors, we will proceed by obtaining labs, CT imaging to further evaluate for etiology especially given his association with postprandial pain for a month.  He does have a history of AAA, will proceed with CT angiogram abdomen pelvis.  Patient hemodynamically stable with some stool mixed with red blood, no external hemorrhoids noted patient did refuse rectal exam.  Given the patient's history of loose stools and noted frank red blood/hematochezia, anticipated need for admission to hospital for further work-up given concern for potential active bleeding and also on antiplatelet agent  All labs, imaging are pending at the time of signout to Dr. Charna Archer at 8:20 PM.       ____________________________________________   FINAL CLINICAL IMPRESSION(S) / ED DIAGNOSES  Final diagnoses:  Lower GI bleeding        Note:  This document was prepared using Dragon voice recognition software and may include unintentional dictation errors       Chad Kitten, MD 01/24/20 2031

## 2020-01-25 ENCOUNTER — Encounter: Payer: Self-pay | Admitting: Internal Medicine

## 2020-01-25 ENCOUNTER — Other Ambulatory Visit: Payer: Self-pay

## 2020-01-25 DIAGNOSIS — K922 Gastrointestinal hemorrhage, unspecified: Secondary | ICD-10-CM

## 2020-01-25 DIAGNOSIS — Z809 Family history of malignant neoplasm, unspecified: Secondary | ICD-10-CM | POA: Diagnosis not present

## 2020-01-25 DIAGNOSIS — I723 Aneurysm of iliac artery: Secondary | ICD-10-CM | POA: Diagnosis present

## 2020-01-25 DIAGNOSIS — I714 Abdominal aortic aneurysm, without rupture: Secondary | ICD-10-CM | POA: Diagnosis present

## 2020-01-25 DIAGNOSIS — Z86711 Personal history of pulmonary embolism: Secondary | ICD-10-CM | POA: Diagnosis not present

## 2020-01-25 DIAGNOSIS — Z7982 Long term (current) use of aspirin: Secondary | ICD-10-CM | POA: Diagnosis not present

## 2020-01-25 DIAGNOSIS — F17210 Nicotine dependence, cigarettes, uncomplicated: Secondary | ICD-10-CM | POA: Diagnosis present

## 2020-01-25 DIAGNOSIS — I10 Essential (primary) hypertension: Secondary | ICD-10-CM

## 2020-01-25 DIAGNOSIS — H5461 Unqualified visual loss, right eye, normal vision left eye: Secondary | ICD-10-CM | POA: Diagnosis present

## 2020-01-25 DIAGNOSIS — Z7951 Long term (current) use of inhaled steroids: Secondary | ICD-10-CM | POA: Diagnosis not present

## 2020-01-25 DIAGNOSIS — I69354 Hemiplegia and hemiparesis following cerebral infarction affecting left non-dominant side: Secondary | ICD-10-CM | POA: Diagnosis not present

## 2020-01-25 DIAGNOSIS — K921 Melena: Secondary | ICD-10-CM

## 2020-01-25 DIAGNOSIS — K529 Noninfective gastroenteritis and colitis, unspecified: Secondary | ICD-10-CM | POA: Diagnosis present

## 2020-01-25 DIAGNOSIS — E785 Hyperlipidemia, unspecified: Secondary | ICD-10-CM | POA: Diagnosis present

## 2020-01-25 DIAGNOSIS — E1169 Type 2 diabetes mellitus with other specified complication: Secondary | ICD-10-CM | POA: Diagnosis present

## 2020-01-25 DIAGNOSIS — Z7902 Long term (current) use of antithrombotics/antiplatelets: Secondary | ICD-10-CM | POA: Diagnosis not present

## 2020-01-25 DIAGNOSIS — Z79899 Other long term (current) drug therapy: Secondary | ICD-10-CM | POA: Diagnosis not present

## 2020-01-25 DIAGNOSIS — Z794 Long term (current) use of insulin: Secondary | ICD-10-CM | POA: Diagnosis not present

## 2020-01-25 DIAGNOSIS — G8194 Hemiplegia, unspecified affecting left nondominant side: Secondary | ICD-10-CM

## 2020-01-25 DIAGNOSIS — Z20822 Contact with and (suspected) exposure to covid-19: Secondary | ICD-10-CM | POA: Diagnosis present

## 2020-01-25 DIAGNOSIS — Z95828 Presence of other vascular implants and grafts: Secondary | ICD-10-CM | POA: Diagnosis not present

## 2020-01-25 DIAGNOSIS — Z79891 Long term (current) use of opiate analgesic: Secondary | ICD-10-CM | POA: Diagnosis not present

## 2020-01-25 DIAGNOSIS — I6523 Occlusion and stenosis of bilateral carotid arteries: Secondary | ICD-10-CM | POA: Diagnosis present

## 2020-01-25 DIAGNOSIS — Z86718 Personal history of other venous thrombosis and embolism: Secondary | ICD-10-CM | POA: Diagnosis not present

## 2020-01-25 DIAGNOSIS — Z91030 Bee allergy status: Secondary | ICD-10-CM | POA: Diagnosis not present

## 2020-01-25 DIAGNOSIS — Z7901 Long term (current) use of anticoagulants: Secondary | ICD-10-CM | POA: Diagnosis not present

## 2020-01-25 DIAGNOSIS — R110 Nausea: Secondary | ICD-10-CM | POA: Diagnosis present

## 2020-01-25 DIAGNOSIS — Z8249 Family history of ischemic heart disease and other diseases of the circulatory system: Secondary | ICD-10-CM | POA: Diagnosis not present

## 2020-01-25 LAB — GLUCOSE, CAPILLARY
Glucose-Capillary: 111 mg/dL — ABNORMAL HIGH (ref 70–99)
Glucose-Capillary: 119 mg/dL — ABNORMAL HIGH (ref 70–99)
Glucose-Capillary: 143 mg/dL — ABNORMAL HIGH (ref 70–99)
Glucose-Capillary: 143 mg/dL — ABNORMAL HIGH (ref 70–99)
Glucose-Capillary: 170 mg/dL — ABNORMAL HIGH (ref 70–99)

## 2020-01-25 LAB — CBC
HCT: 38.5 % — ABNORMAL LOW (ref 39.0–52.0)
Hemoglobin: 13.2 g/dL (ref 13.0–17.0)
MCH: 33.2 pg (ref 26.0–34.0)
MCHC: 34.3 g/dL (ref 30.0–36.0)
MCV: 97 fL (ref 80.0–100.0)
Platelets: 166 10*3/uL (ref 150–400)
RBC: 3.97 MIL/uL — ABNORMAL LOW (ref 4.22–5.81)
RDW: 12.6 % (ref 11.5–15.5)
WBC: 10.5 10*3/uL (ref 4.0–10.5)
nRBC: 0 % (ref 0.0–0.2)

## 2020-01-25 LAB — HEMOGLOBIN A1C
Hgb A1c MFr Bld: 6.5 % — ABNORMAL HIGH (ref 4.8–5.6)
Mean Plasma Glucose: 139.85 mg/dL

## 2020-01-25 LAB — HIV ANTIBODY (ROUTINE TESTING W REFLEX): HIV Screen 4th Generation wRfx: NONREACTIVE

## 2020-01-25 MED ORDER — SODIUM CHLORIDE 0.9 % IV BOLUS
1000.0000 mL | Freq: Once | INTRAVENOUS | Status: AC
  Administered 2020-01-25: 1000 mL via INTRAVENOUS

## 2020-01-25 MED ORDER — SODIUM CHLORIDE 0.9% FLUSH: 10.0000 mL | INTRAVENOUS | Status: DC | PRN

## 2020-01-25 MED ORDER — SODIUM CHLORIDE 0.9% FLUSH
10.0000 mL | Freq: Two times a day (BID) | INTRAVENOUS | Status: DC
  Administered 2020-01-26 – 2020-01-30 (×6): 10 mL

## 2020-01-25 MED ORDER — SERTRALINE HCL 100 MG PO TABS
100.0000 mg | ORAL_TABLET | Freq: Every day | ORAL | Status: DC
  Administered 2020-01-25 – 2020-01-30 (×6): 100 mg via ORAL
  Filled 2020-01-25: qty 2
  Filled 2020-01-25: qty 1
  Filled 2020-01-25 (×2): qty 2
  Filled 2020-01-25 (×2): qty 1

## 2020-01-25 MED ORDER — MOMETASONE FURO-FORMOTEROL FUM 100-5 MCG/ACT IN AERO
2.0000 | INHALATION_SPRAY | Freq: Two times a day (BID) | RESPIRATORY_TRACT | Status: DC
  Administered 2020-01-25 – 2020-01-30 (×9): 2 via RESPIRATORY_TRACT
  Filled 2020-01-25 (×3): qty 8.8

## 2020-01-25 NOTE — Consult Note (Signed)
[facility] Consult Note  MRN : [id number]  Chad Combs is a 64 y.o. (04-06-1956) male who presents with chief complaint of  Chief Complaint  Patient presents with  . Nausea   History of Present Illness: The patient is a 64 year old male with medical history significant for DM, HTN, COPD, AAA, CVA with left hemiplegia, who presents to the emergency room with a complaint of loose bowel movements with blood noted today by nursing home staff.  Patient reports a one month history of diffuse abdominal pain of mild to moderate intensity when eating without associated nausea, vomiting, fever or chills.  He denies cough, chest pain or shortness of breath.  No prior history of rectal bleeding and denies rectal pain or hemorrhoids. On arrival he was hemodynamically stable.  Blood work significant for WBC of 16,000, hemoglobin 15.7, lactic acid normal at 1.6, lipase 29, chemistries normal. CT angiogram abdomen and pelvis showed findings consistent with diffuse colitis infrarenal AAA measuring up to 7.3 cm as well as bilateral common iliac artery aneurysmal dilatation of 3.5 and 4.7 cm respectively, subacute compression deformities T12 and L2, mild bilateral perinephric stranding.  Patient last seen on 11/08/16 in our clinic (note as follows): "The patient is symptomatic with respect to his bilateral carotid stenosis.  The patient has a lesion that is >70% with the right ICA occlusion. Patient should undergo CT angiography of the carotid arteries to define the degree of stenosis of the internal carotid arteries bilaterally and the anatomic suitability for surgery vs. intervention. The patient will need surgery cardiac clearance, once cleared the patient will be scheduled for surgery. The risks, benefits and alternative therapies were reviewed in detail with the patient.  All questions were answered.  The patient agrees to proceed with imaging."  Unfortunately, the patient was  lost to follow-up.  Vascular surgery was consulted by Dr. Onalee Hua for possible AAA repair.  Current Facility-Administered Medications  Medication Dose Route Frequency Provider Last Rate Last Admin  . 0.9 %  sodium chloride infusion   Intravenous Continuous Athena Masse, MD 75 mL/hr at 01/25/20 1357 New Bag at 01/25/20 1357  . acetaminophen (TYLENOL) tablet 650 mg  650 mg Oral Q6H PRN Athena Masse, MD       Or  . acetaminophen (TYLENOL) suppository 650 mg  650 mg Rectal Q6H PRN Athena Masse, MD      . amLODipine (NORVASC) tablet 5 mg  5 mg Oral Daily Athena Masse, MD   5 mg at 01/25/20 4268  . atorvastatin (LIPITOR) tablet 20 mg  20 mg Oral q1800 Judd Gaudier V, MD      . ciprofloxacin (CIPRO) IVPB 400 mg  400 mg Intravenous Q12H Athena Masse, MD   Paused at 01/25/20 1142  . divalproex (DEPAKOTE) DR tablet 125 mg  125 mg Oral QHS Athena Masse, MD   125 mg at 01/25/20 0225  . enoxaparin (LOVENOX) injection 40 mg  40 mg Subcutaneous Q24H Athena Masse, MD   40 mg at 01/25/20 0947  . gabapentin (NEURONTIN) capsule 300 mg  300 mg Oral Daily Athena Masse, MD   300 mg at 01/25/20 3419  . HYDROcodone-acetaminophen (NORCO/VICODIN) 5-325 MG per tablet 1 tablet  1 tablet Oral Daily PRN Athena Masse, MD      . insulin aspart (novoLOG) injection 0-5 Units  0-5 Units Subcutaneous QHS Athena Masse, MD      . insulin aspart (  novoLOG) injection 0-9 Units  0-9 Units Subcutaneous TID WC Athena Masse, MD   1 Units at 01/25/20 1350  . levETIRAcetam (KEPPRA) tablet 250 mg  250 mg Oral BID Athena Masse, MD   250 mg at 01/25/20 5366  . lisinopril (ZESTRIL) tablet 40 mg  40 mg Oral Daily Athena Masse, MD   40 mg at 01/25/20 0936  . metoprolol tartrate (LOPRESSOR) tablet 12.5 mg  12.5 mg Oral BID Athena Masse, MD   12.5 mg at 01/25/20 4403  . metroNIDAZOLE (FLAGYL) IVPB 500 mg  500 mg Intravenous Q8H Athena Masse, MD   Paused at 01/25/20 [phone]  . mometasone-formoterol (DULERA)  100-5 MCG/ACT inhaler 2 puff  2 puff Inhalation BID Jennye Boroughs, MD      . morphine 2 MG/ML injection 2 mg  2 mg Intravenous Q2H PRN Athena Masse, MD      . ondansetron [facility]) tablet 4 mg  4 mg Oral Q6H PRN Athena Masse, MD       Or  . ondansetron [facility]) injection 4 mg  4 mg Intravenous Q6H PRN Athena Masse, MD      . pantoprazole (PROTONIX) injection 40 mg  40 mg Intravenous q1800 Athena Masse, MD   40 mg at 01/25/20 0230  . potassium chloride SA (KLOR-CON) CR tablet 20 mEq  20 mEq Oral BID Athena Masse, MD   20 mEq at 01/25/20 0937  . sertraline (ZOLOFT) tablet 100 mg  100 mg Oral Daily Jennye Boroughs, MD   100 mg at 01/25/20 1000  . tamsulosin (FLOMAX) capsule 0.4 mg  0.4 mg Oral QPC supper Athena Masse, MD       Current Outpatient Medications  Medication Sig Dispense Refill  . amLODipine (NORVASC) 5 MG tablet Take 5 mg by mouth daily.    Marland Kitchen aspirin 81 MG chewable tablet Chew 81 mg by mouth daily.    Marland Kitchen atorvastatin (LIPITOR) 20 MG tablet Take 20 mg by mouth daily.    . budesonide-formoterol (SYMBICORT) 80-4.5 MCG/ACT inhaler Inhale 2 puffs into the lungs every 12 (twelve) hours.    . clopidogrel (PLAVIX) 75 MG tablet Take 75 mg by mouth daily.    . divalproex (DEPAKOTE) 125 MG DR tablet Take 125 mg by mouth at bedtime.    Marland Kitchen ezetimibe (ZETIA) 10 MG tablet Take 1 tablet (10 mg total) by mouth daily at 6 PM. (Patient taking differently: Take 10 mg by mouth daily. )    . gabapentin (NEURONTIN) 300 MG capsule Take 300 mg by mouth daily.    Marland Kitchen HYDROcodone-acetaminophen (NORCO/VICODIN) 5-325 MG tablet Take 1 tablet by mouth daily as needed for moderate pain. (Patient taking differently: See admin instructions. Take 1 tablet by mouth every 12 hours as scheduled and 1 tablet by mouth daily as needed for pain) 20 tablet 0  . LEVEMIR FLEXTOUCH 100 UNIT/ML FlexPen Inject 24 Units into the skin at bedtime.    . levETIRAcetam (KEPPRA) 250 MG tablet Take 250 mg by mouth 2 (two) times  daily.    Marland Kitchen lisinopril (ZESTRIL) 40 MG tablet Take 40 mg by mouth daily.    . metoprolol tartrate (LOPRESSOR) 25 MG tablet Take 0.5 tablets (12.5 mg total) by mouth 2 (two) times daily. 60 tablet 2  . ondansetron (ZOFRAN) 4 MG tablet Take 4 mg by mouth at bedtime.    . pantoprazole (PROTONIX) 40 MG tablet Take 40 mg by mouth 2 (two) times daily.     Marland Kitchen  polyethylene glycol (MIRALAX / GLYCOLAX) 17 g packet Take 17 g by mouth daily.    . potassium chloride SA (KLOR-CON) 20 MEQ tablet Take 20 mEq by mouth 2 (two) times daily.    Marland Kitchen senna (SENOKOT) 8.6 MG TABS tablet Take 2 tablets by mouth at bedtime.    . sertraline (ZOLOFT) 100 MG tablet Take 100 mg by mouth daily.    . simethicone (MYLICON) 301 MG chewable tablet Chew 125 mg by mouth 3 (three) times daily.    . tamsulosin (FLOMAX) 0.4 MG CAPS capsule Take 1 capsule (0.4 mg total) by mouth daily after supper. 30 capsule   . tiZANidine (ZANAFLEX) 2 MG tablet Take 1 tablet (2 mg total) by mouth at bedtime. 30 tablet 0  . OZEMPIC, 0.25 OR 0.5 MG/DOSE, 2 MG/1.5ML SOPN Inject 0.5 mg into the skin once a week. Friday     Past Medical History:  Diagnosis Date  . AAA (abdominal aortic aneurysm) ([place])    a. 08/2016 CTA: 3.1cm saccular aneurysm of distal Ao arch w/ penetrating atheromatous ulcer. 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 09/2017 CTA chest/abd/pelvis [facility]): 6.3cm infrarenal AAA, unchanged bilateral CIA aneurysms (R 3.5cm, L 2.8cm)-->pt refused intervention.  . Blind right eye   . Carotid arterial disease ([place])    a. 12/2016 CTA Neck: RICA 100%. LICA >60%; b. 06/930 s/p trans aortic LICA revascularization and stent placement [facility]); c. 03/2017 Carotid U/S [facility]): No significant LICA stenosis, patent stent.  Marland Kitchen COPD (chronic obstructive pulmonary disease) ([place])   . Diabetes mellitus without complication ([place])   . Diastolic dysfunction    a. 08/2016 Echo: EF 60%, Gr1 DD, mild  MR, mildly dil LA/RV. No cardiac source of emboli.  . Femoral DVT (deep venous thrombosis) ([place])    a. 08/2016 L Fem Vein DVT ->xarelto/IVC filter. Xarelto subsequently d/c'd.  Marland Kitchen History of pulmonary embolism    a. 08/2016 following CVA-->chronic xarelto, s/p IVC filter. Xarelto subsequently d/c'd.  Marland Kitchen History of right MCA stroke    a. 08/2016 MRI: R MCA infarct w/ occlusion of RICA & R MCA.   Marland Kitchen Hypertension   . Tobacco abuse    Past Surgical History:  Procedure Laterality Date  . IVC FILTER INSERTION N/A 08/24/2016   Procedure: IVC Filter Insertion;  Surgeon: Katha Cabal, MD;  Location: [place] [facility];  Service: Cardiovascular;  Laterality: N/A;   Social History Social History   Tobacco Use  . Smoking status: Current Some Day Smoker    Packs/day: 1.50    Years: 45.00    Pack years: 67.50    Types: Cigarettes  . Smokeless tobacco: Never Used  Vaping Use  . Vaping Use: Never used  Substance Use Topics  . Alcohol use: Yes    Alcohol/week: 0.0 standard drinks    Comment: occasional (once every couple months)  . Drug use: No   Family History Family History  Problem Relation Age of Onset  . Cancer Mother   . Heart disease Father   Denies family history of aneurysmal disease, peripheral artery disease or renal disease.  Allergies  Allergen Reactions  . Bee Pollen Hives and Swelling   REVIEW OF SYSTEMS (Negative unless checked)  Constitutional: [] Weight loss  [] Fever  [] Chills Cardiac: [] Chest pain   [] Chest pressure   [] Palpitations   [] Shortness of breath when laying flat   [] Shortness of breath at rest   [] Shortness of breath with exertion.  Vascular:  [] Pain in legs with walking   [] Pain in legs at rest   [] Pain in legs when laying flat   [] Claudication   [] Pain in feet when walking  [] Pain in feet at rest  [] Pain in feet when laying flat   [] History of DVT   [] Phlebitis   [] Swelling in legs   [] Varicose veins   [] Non-healing ulcers Pulmonary:   [] Uses home oxygen    [] Productive cough   [] Hemoptysis   [] Wheeze  [] COPD   [] Asthma Neurologic:  [] Dizziness  [] Blackouts   [] Seizures   [x] History of stroke   [x] History of TIA  [] Aphasia   [] Temporary blindness   [] Dysphagia   [] Weakness or numbness in arms   [] Weakness or numbness in legs Musculoskeletal:  [] Arthritis   [] Joint swelling   [] Joint pain   [] Low back pain Hematologic:  [] Easy bruising  [] Easy bleeding   [] Hypercoagulable state   [] Anemic  [] Hepatitis Gastrointestinal:  [] Blood in stool   [] Vomiting blood  [] Gastroesophageal reflux/heartburn   [] Difficulty swallowing. Genitourinary:  [] Chronic kidney disease   [] Difficult urination  [] Frequent urination  [] Burning with urination   [] Blood in urine Skin:  [] Rashes   [] Ulcers   [] Wounds Psychological:  [] History of anxiety   []  History of major depression.  Positive for known abdominal aortic disease and carotid artery stenosis  Physical Examination  Vitals:   01/25/20 0933 01/25/20 1140 01/25/20 1220 01/25/20 1342  BP: (!) 141/88 (!) 165/91 (!) 154/88 (!) 150/93  Pulse: 63 63  67  Resp: 18   20  Temp:      SpO2: 98% 94%  95%  Weight:      Height:       Body mass index is 26.14 kg/m. Gen:  WD/WN, NAD Head: [place]/AT, No temporalis wasting. Prominent temp pulse not noted. Ear/Nose/Throat: Hearing grossly intact, nares w/o erythema or drainage, oropharynx w/o Erythema/Exudate Eyes: Sclera non-icteric, conjunctiva clear Neck: Trachea midline.  No JVD. No carotid bruits auscultated. Pulmonary:  Good air movement, respirations not labored, equal bilaterally.  Cardiac: RRR, normal S1, S2. Vascular:  Vessel Right Left  Radial Palpable Palpable  Ulnar Palpable Palpable  Brachial Palpable Palpable  Carotid Palpable, without bruit Palpable, without bruit  Aorta Not palpable N/A  Femoral Palpable Palpable  Popliteal Palpable Palpable  PT Palpable Palpable  DP Palpable Palpable   Gastrointestinal: soft, non-tender/non-distended. No  guarding/reflex.  Musculoskeletal: Left hemiplegia. extremities without ischemic changes. No edema. Neurologic: Motor exam as listed above. Psychiatric: Judgment intact, Mood & affect appropriate for pt's clinical situation. Dermatologic: No rashes or ulcers noted.  No cellulitis or open wounds. Lymph : No Cervical, Axillary, or Inguinal lymphadenopathy.  CBC Lab Results  Component Value Date   WBC 10.5 01/25/2020   HGB 13.2 01/25/2020   HCT 38.5 (L) 01/25/2020   MCV 97.0 01/25/2020   PLT 166 01/25/2020   BMET    Component Value Date/Time   NA 141 01/24/2020 2043   K 3.9 01/24/2020 2043   CL 107 01/24/2020 2043   CO2 24 01/24/2020 2043   GLUCOSE 164 (H) 01/24/2020 2043   BUN 23 01/24/2020 2043   CREATININE 1.07 01/24/2020 2043   CALCIUM 9.0 01/24/2020 2043   GFRNONAA >60 01/24/2020 2043   GFRAA >60 01/24/2020 2043   Estimated Creatinine Clearance: 69.7 mL/min (by C-G formula based on SCr of 1.07 mg/dL).  COAG Lab Results  Component Value Date   INR 1.0 01/24/2020   INR 1.00 08/21/2016   Radiology CT Angio  Abd/Pel W and/or Wo Contrast  Result Date: 01/24/2020 CLINICAL DATA:  Diarrhea and rectal bleeding which began today with nausea for 4 weeks EXAM: CTA ABDOMEN AND PELVIS WITHOUT AND WITH CONTRAST TECHNIQUE: Multidetector CT imaging of the abdomen and pelvis was performed using the standard protocol during bolus administration of intravenous contrast. Multiplanar reconstructed images and MIPs were obtained and reviewed to evaluate the vascular anatomy. CONTRAST:  130mL OMNIPAQUE IOHEXOL 350 MG/ML SOLN COMPARISON:  CT angiography abdomen pelvis 05/19/2017 FINDINGS: VASCULAR Aorta: There is extensive atherosclerotic plaque throughout the native abdominal aorta. Redemonstration of the large fusiform aneurysm of the infrarenal abdominal aorta which does extend into the iliac bifurcation. Maximal is aortic diameter to 7.3 cm is increased from the 6 point 6 cm diameter at a similar  level on comparison imaging. Additionally, there has been progressive accumulation of the endoluminal plaque resulting in luminal narrowing with the residual aortic lumen measuring up to 1.9 cm, previously 2.7 cm. Aneurysmal dilatation begins just below the left renal artery origin. Celiac: Hook like configuration of the celiac origin compatible with compression by the median arcuate ligament likely with some slight contribution of mild calcified ostial plaque which results in some moderate luminal narrowing with poststenotic dilatation to 8 mm, the overall configuration of which is similar to comparison. Otherwise normal opacification with typical branching pattern. No acute abnormality SMA: Mild ostial plaque narrowing of the SMA origin. Vessel otherwise normally opacified without other significant stenosis. Typical branching pattern. No evidence of aneurysm, dissection or vasculitis. Renals: Single renal arteries bilaterally. Mild plaque narrowing at both renal ostia. No other significant stenosis however with normal opacification. No evidence of aneurysm, dissection or vasculitis. No features of fibromuscular dysplasia. IMA: Segmental occlusion of the IMA origin with more distal opacification likely supplied via left colic collateralization. No other acute abnormality. Inflow: Extension of the large fusiform aneurysm into the common iliac artery origins. The right common iliac measuring up to 4.7 cm in diameter, left common iliac measuring up to 3.5 cm in diameter, increased from 4.1 and 2.4 cm respectively. The right common iliac dilatation extends to the bifurcation with the internal and external iliac arteries. Moderate to severe stenosis at the origin of the left internal iliac but with normal distal opacification. Mild stenosis at the right external iliac origin. The left common iliac artery returns to normal caliber by the mid vessel with some moderate plaque at the internal iliac artery origin and some  segmental narrowing at the branches of the internal and external divisions as well as additional moderate narrowing at the external iliac origin with multifocal segmental narrowing to the outflow levels. Proximal Outflow: Atheromatous plaque is present in the bilateral common femoral arteries without significant flow-limiting stenosis at the origins of the superficial femoral and profundus arteries bilaterally. No acute luminal abnormality is seen. Veins: Venous phase imaging demonstrates normal opacification of the hepatic and portal veins. Infrarenal caval filter is in similar position to prior with some lateral tilt. No other significant venous findings Review of the MIP images confirms the above findings. NON-VASCULAR Lower chest: Dependent atelectatic changes are present in the lung bases. Normal heart size. No pericardial effusion. Increased fat attenuation at the interatrial septum sparing the fossa ovalis. Could reflect mild in lipomatous hypertrophy of the intra-atrial septum. Hepatobiliary: No worrisome focal liver lesions. Smooth liver surface contour. Normal hepatic attenuation. Some dependently layering density in the gallbladder may reflect biliary sand or sludge. No visible calcified gallstones. No biliary ductal dilatation. Pancreas: Unremarkable. No  pancreatic ductal dilatation or surrounding inflammatory changes. Spleen: Normal in size. No concerning splenic lesions. Adrenals/Urinary Tract: Normal adrenal glands. Kidneys enhance symmetrically and uniformly. Few subcentimeter hypodense foci too small to fully characterize on CT imaging but statistically likely benign. Stable mild bilateral perinephric stranding, nonspecific. No concerning renal mass, urolithiasis or hydronephrosis. Stomach/Bowel: Distal esophagus, stomach and duodenal sweep are unremarkable. No small bowel wall thickening or dilatation. No evidence of obstruction. Diffusely fluid-filled appearance of the colon with lack of formed  stool. No proximal colonic thickening or dilatation. There is marked thickening in stranding which is contiguous from the level of the distal sigmoid to the rectum with edematous mural thickening and serpiginous densities suggesting hemorrhoidal collaterals. Accumulation of attenuation in the folds of the mucosa of the rectum between the precontrast and venous phases is most suggestive mucosal hyperemia. Some reactive stranding noted the adjacent mesentery presacral space. Lymphatic: No suspicious or enlarged lymph nodes in the included lymphatic chains. Reproductive: The prostate and seminal vesicles are unremarkable. Other: Inflammatory changes centered upon the rectosigmoid as above. No abdominopelvic free air. No pneumatosis or portal venous gas. No bowel containing hernias. Bilateral fat containing inguinal hernias, left greater than right. Musculoskeletal: The osseous structures appear diffusely demineralized which may limit detection of small or nondisplaced fractures. Subacute appearing compression deformities at T12 and L2 with superimposed Schmorl's node formations are new since the 2018 comparison with a to 50% height loss at T12 and 40% height loss at L2. No other acute or suspicious osseous abnormalities. Multilevel degenerative changes are present in the imaged portions of the spine. Features most pronounced L5-S1. Additional degenerative changes in the hips and pelvis with partial ankylosis across the SI joints. IMPRESSION: VASCULAR. 1. Circumferential thickening of the rectosigmoid with associated inflammatory changes of the colon compatible with a nonspecific colitis. Accumulation of attenuation in the folds of the rectum between the precontrast and venous phases is most suggestive of mucosal hyperemia associated with the more diffuse colitis as well as hemorrhoidal collaterals. 2. Interval increase in size of the large infrarenal abdominal aortic aneurysm extending into the iliac arteries, now  measuring up to 7.3 cm in diameter with progressive accumulation of the endoluminal plaque resulting in luminal narrowing with the residual aortic lumen measuring up to 1.9 cm. 3. Interval increase in size of the bilateral common iliac artery aneurysmal dilatation as well, now measuring up to 4.7 cm in diameter on the right and 3.5 cm on the left. 4. Aortic Atherosclerosis (ICD10-I70.0). 5. Moderate to severe stenosis of the left internal iliac artery origin. 6. Mild plaque narrowing at the celiac axis as well as a focal like configuration suggesting some compression by the median arcuate ligament with poststenotic dilatation. 7. Mild stenosis of the right external iliac origin. 8. Segmental occlusion of the IMA origin with more distal opacification likely supplied via left colic collateralization. 9. Mild ostial narrowing of the SMA and bilateral renal artery origins. 10. Coronary artery atherosclerosis. 11. Features suggest mild lipomatous hypertrophy of the inter-atrial septum. 12. Infrarenal caval filter is in similar position to prior with some lateral tilt. NON-VASCULAR 1. Subacute appearing compression deformities at T12 and L2 with superimposed Schmorl's node formations are new since the 2018 comparison with a to 50% height loss at T12 and 40% height loss at L2. 2. Stable mild bilateral perinephric stranding, nonspecific. 3. Hyperdense material in the gallbladder, possibly biliary sludge a sand. Correlate for right upper quadrant symptoms. Electronically Signed   By: Elwin Sleight.D.  On: 01/24/2020 22:29   Assessment/Plan The patient is a 64 year old male well-known to our service last seen in our office on 11/08/16 in regard to his known carotid artery disease and abdominal aortic aneurysm  1.  Abdominal Aortic Aneurysm: Patient was last seen in our office in 2018 with known abdominal aortic aneurysm.  Recommended the patient treat his carotid artery stenosis prior to repairing his abdominal  aneurysm. In 2018, the patient had a LICA lesion that was >70% with right ICA occlusion.  Patient now presents with abdominal aortic aneurysm now measuring 7.3cm as well as bilateral common iliac artery aneurysmal dilatation of 3.5cm and 4.7cm.  The patient also presented with leukocytosis as well as diffuse colitis.  Patient does need repair of his abdominal aneurysm however in the setting of colitis /increased white blood cell count the risk of a stent graft infection increases the patient's mortality especially.  Recommend treatment with IV antibiotics and repeat imaging.  Once infection is cleared we will plan on stent graft repair to the abdominal aortic aneurysm.  Hopefully can plan for Tuesday.  2.  Carotid Artery Stenosis: Patient with known carotid artery disease last seen in our office in 2018.  In 3818, patient had a LICA lesion that was >70% with right ICA occlusion.  At the time, the recommendation was to have his carotid repair than his AAA however due to the critical nature of his abdominal aorta and its own we will plan on repairing after aneurysm repair.  3.  Hyperlipidemia: On aspirin statin Plavix for medical management Encouraged good control as its slows the progression of atherosclerotic disease  Seen and examined with Dr. Francene Castle, PA-C  01/25/2020 2:15 PM  This note was created with Dragon medical transcription system.  Any error is purely unintentional

## 2020-01-25 NOTE — Progress Notes (Addendum)
Progress Note    Chad Combs  GOT:[id number] DOB: 11-Jul-1955  DOA: 01/24/2020 PCP: Alvester Morin, MD      Brief Narrative:    Medical records reviewed and are as summarized below:  Chad Combs is a 64 y.o. 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 Protonix Hold aspirin and Plavix for now because of hematochezia He has been evaluated by vascular surgeon plan for AAA repair on Tuesday,  01/29/2020 Continue antihypertensives NovoLog as needed for hyperglycemia Continue Lipitor Continue bronchodilators Continue antiepileptics    Body mass index is 26.14 kg/m.  Diet Order            Diet clear liquid Room service appropriate? Yes; Fluid consistency: Thin  Diet effective now                       Medications:   . amLODipine  5 mg Oral Daily  . atorvastatin  20 mg Oral q1800  . divalproex  125 mg Oral QHS  . enoxaparin (LOVENOX) injection  40 mg Subcutaneous Q24H  . gabapentin  300 mg Oral Daily  . insulin aspart  0-5 Units Subcutaneous QHS  . insulin aspart  0-9 Units Subcutaneous TID WC  . levETIRAcetam   250 mg Oral BID  . lisinopril  40 mg Oral Daily  . metoprolol tartrate  12.5 mg Oral BID  . mometasone-formoterol  2 puff Inhalation BID  . pantoprazole (PROTONIX) IV  40 mg Intravenous q1800  . potassium chloride SA  20 mEq Oral BID  . sertraline  100 mg Oral Daily  . tamsulosin  0.4 mg Oral QPC supper   Continuous Infusions: . sodium chloride 75 mL/hr at 01/25/20 1357  . ciprofloxacin Stopped (01/25/20 1142)  . metronidazole Stopped (01/25/20 0856)     Anti-infectives (From admission, onward)   Start     Dose/Rate Route Frequency Ordered Stop   01/25/20 1000  ciprofloxacin (CIPRO) IVPB 400 mg     Discontinue     400 mg 200 mL/hr over 60 Minutes Intravenous Every 12 hours 01/24/20 2326     01/25/20 0800  metroNIDAZOLE (FLAGYL) IVPB 500 mg     Discontinue     500 mg 100 mL/hr over 60 Minutes Intravenous Every 8 hours 01/24/20 2326     01/24/20 2300  ciprofloxacin (CIPRO) IVPB 400 mg        400 mg 200 mL/hr over 60 Minutes Intravenous  Once 01/24/20 2250 01/25/20 0200   01/24/20 2300  metroNIDAZOLE (FLAGYL) IVPB 500 mg        500 mg 100 mL/hr over 60 Minutes Intravenous  Once 01/24/20 2250 01/25/20 0421             Family Communication/Anticipated D/C date and plan/Code Status   DVT prophylaxis: enoxaparin (LOVENOX) injection 40 mg Start: 01/25/20 1000     Code Status: Full Code  Family Communication: Plan discussed with patient Disposition Plan:    Status is: Inpatient  Remains inpatient appropriate because:IV treatments appropriate due to intensity of illness or inability to take PO and Inpatient level of care appropriate due to severity of illness   Dispo: The patient is from: Home              Anticipated d/c is to: Home              Anticipated d/c date is: > 3 days              Patient currently is not medically stable to d/c.                 Subjective:   Abdominal pain is better.  No vomiting or diarrhea.  He said he did not have  much blood in the stools yesterday.  Objective:    Vitals:   01/25/20 0933 01/25/20 1140 01/25/20 1220 01/25/20 1342  BP: (!) 141/88 (!) 165/91 (!) 154/88 (!) 150/93  Pulse: 63 63  67  Resp: 18   20  Temp:      SpO2: 98% 94%  95%  Weight:      Height:       No data found.   Intake/Output Summary (Last 24 hours) at 01/25/2020 1531 Last data filed at 01/25/2020 1302 Gross per 24 hour  Intake 296.89 ml  Output --  Net 296.89 ml   Filed Weights   01/24/20 1921  Weight: 80.3 kg    Exam:  GEN: NAD SKIN: No rash EYES: EOMI, blind in right eye ENT: MMM CV: RRR PULM: CTA B ABD: soft, ND, left lower quadrant tenderness, no rebound tenderness or guarding, +BS CNS: AAO x 3, left-sided hemiplegia EXT: No edema or tenderness   Data Reviewed:   I have personally reviewed following labs and imaging studies:  Labs: Labs show the following:   Basic Metabolic Panel: Recent Labs  Lab 01/24/20 2043  NA 141  K 3.9  CL 107  CO2 24  GLUCOSE 164*  BUN 23  CREATININE 1.07  CALCIUM 9.0   GFR Estimated Creatinine Clearance: 69.7 mL/min (by C-G formula based on SCr of 1.07 mg/dL). Liver Function Tests: Recent Labs  Lab 01/24/20 2043  AST 29  ALT 26  ALKPHOS 65  BILITOT 1.1  PROT 7.6  ALBUMIN 3.7   Recent Labs  Lab 01/24/20 2043  LIPASE 29   No results for input(s): AMMONIA in the last 168 hours. Coagulation profile Recent Labs  Lab 01/24/20 2043  INR 1.0    CBC: Recent Labs  Lab 01/24/20 2043 01/25/20 0733  WBC 16.9* 10.5  HGB 15.7 13.2  HCT 45.6 38.5*  MCV 96.0 97.0  PLT 197 166   Cardiac Enzymes: No results for input(s): CKTOTAL, CKMB, CKMBINDEX, TROPONINI in the last 168 hours. BNP (last 3 results) No results for input(s): PROBNP in the last 8760 hours. CBG: Recent Labs  Lab 01/25/20 0006 01/25/20 0934 01/25/20 1339  GLUCAP 170* 111* 143*   D-Dimer: No results for input(s): DDIMER in the last 72 hours. Hgb A1c: Recent Labs     01/24/20 2043  HGBA1C 6.5*   Lipid Profile: No results for input(s):  CHOL, HDL, LDLCALC, TRIG, CHOLHDL, LDLDIRECT in the last 72 hours. Thyroid function studies: No results for input(s): TSH, T4TOTAL, T3FREE, THYROIDAB in the last 72 hours.  Invalid input(s): FREET3 Anemia work up: No results for input(s): VITAMINB12, FOLATE, FERRITIN, TIBC, IRON, RETICCTPCT in the last 72 hours. Sepsis Labs: Recent Labs  Lab 01/24/20 2043 01/24/20 2247 01/25/20 0733  WBC 16.9*  --  10.5  LATICACIDVEN  --  1.6  --     Microbiology Recent Results (from the past 240 hour(s))  SARS Coronavirus 2 by RT PCR (hospital order, performed in [facility] hospital lab) Nasopharyngeal Nasopharyngeal Swab     Status: None   Collection Time: 01/24/20  8:04 PM   Specimen: Nasopharyngeal Swab  Result Value Ref Range Status   SARS Coronavirus 2 NEGATIVE NEGATIVE Final    Comment: (NOTE) SARS-CoV-2 target nucleic acids are NOT DETECTED.  The SARS-CoV-2 RNA is generally detectable in upper and lower respiratory specimens during the acute phase of infection. The lowest concentration of SARS-CoV-2 viral copies this assay can detect is 250 copies / mL. A negative result does not preclude SARS-CoV-2 infection and should not be used as the sole basis for treatment or other patient management decisions.  A negative result may occur with improper specimen collection / handling, submission of specimen other than nasopharyngeal swab, presence of viral mutation(s) within the areas targeted by this assay, and inadequate number of viral copies (<250 copies / mL). A negative result must be combined with clinical observations, patient history, and epidemiological information.  Fact Sheet for Patients:   StrictlyIdeas.no  Fact Sheet for Healthcare Providers: BankingDealers.co.za  This test is not yet approved or  cleared by the [place] FDA and has been authorized  for detection and/or diagnosis of SARS-CoV-2 by FDA under an Emergency Use Authorization (EUA).  This EUA will remain in effect (meaning this test can be used) for the duration of the COVID-19 declaration under Section 564(b)(1) of the Act, 21 U.S.C. section 360bbb-3(b)(1), unless the authorization is terminated or revoked sooner.  Performed at [facility], [address] [place], [place] [address]     Procedures and diagnostic studies:  CT Angio Abd/Pel W and/or Wo Contrast  Result Date: 01/24/2020 CLINICAL DATA:  Diarrhea and rectal bleeding which began today with nausea for 4 weeks EXAM: CTA ABDOMEN AND PELVIS WITHOUT AND WITH CONTRAST TECHNIQUE: Multidetector CT imaging of the abdomen and pelvis was performed using the standard protocol during bolus administration of intravenous contrast. Multiplanar reconstructed images and MIPs were obtained and reviewed to evaluate the vascular anatomy. CONTRAST:  129mL OMNIPAQUE IOHEXOL 350 MG/ML SOLN COMPARISON:  CT angiography abdomen pelvis 05/19/2017 FINDINGS: VASCULAR Aorta: There is extensive atherosclerotic plaque throughout the native abdominal aorta. Redemonstration of the large fusiform aneurysm of the infrarenal abdominal aorta which does extend into the iliac bifurcation. Maximal is aortic diameter to 7.3 cm is increased from the 6 point 6 cm diameter at a similar level on comparison imaging. Additionally, there has been progressive accumulation of the endoluminal plaque resulting in luminal narrowing with the residual aortic lumen measuring up to 1.9 cm, previously 2.7 cm. Aneurysmal dilatation begins just below the left renal artery origin. Celiac: Hook like configuration of the celiac origin compatible with compression by the median arcuate ligament likely with some slight contribution of mild calcified ostial plaque which results in some moderate luminal narrowing with poststenotic dilatation to 8 mm, the overall configuration of  which is similar to comparison. Otherwise normal  opacification with typical branching pattern. No acute abnormality SMA: Mild ostial plaque narrowing of the SMA origin. Vessel otherwise normally opacified without other significant stenosis. Typical branching pattern. No evidence of aneurysm, dissection or vasculitis. Renals: Single renal arteries bilaterally. Mild plaque narrowing at both renal ostia. No other significant stenosis however with normal opacification. No evidence of aneurysm, dissection or vasculitis. No features of fibromuscular dysplasia. IMA: Segmental occlusion of the IMA origin with more distal opacification likely supplied via left colic collateralization. No other acute abnormality. Inflow: Extension of the large fusiform aneurysm into the common iliac artery origins. The right common iliac measuring up to 4.7 cm in diameter, left common iliac measuring up to 3.5 cm in diameter, increased from 4.1 and 2.4 cm respectively. The right common iliac dilatation extends to the bifurcation with the internal and external iliac arteries. Moderate to severe stenosis at the origin of the left internal iliac but with normal distal opacification. Mild stenosis at the right external iliac origin. The left common iliac artery returns to normal caliber by the mid vessel with some moderate plaque at the internal iliac artery origin and some segmental narrowing at the branches of the internal and external divisions as well as additional moderate narrowing at the external iliac origin with multifocal segmental narrowing to the outflow levels. Proximal Outflow: Atheromatous plaque is present in the bilateral common femoral arteries without significant flow-limiting stenosis at the origins of the superficial femoral and profundus arteries bilaterally. No acute luminal abnormality is seen. Veins: Venous phase imaging demonstrates normal opacification of the hepatic and portal veins. Infrarenal caval filter is in  similar position to prior with some lateral tilt. No other significant venous findings Review of the MIP images confirms the above findings. NON-VASCULAR Lower chest: Dependent atelectatic changes are present in the lung bases. Normal heart size. No pericardial effusion. Increased fat attenuation at the interatrial septum sparing the fossa ovalis. Could reflect mild in lipomatous hypertrophy of the intra-atrial septum. Hepatobiliary: No worrisome focal liver lesions. Smooth liver surface contour. Normal hepatic attenuation. Some dependently layering density in the gallbladder may reflect biliary sand or sludge. No visible calcified gallstones. No biliary ductal dilatation. Pancreas: Unremarkable. No pancreatic ductal dilatation or surrounding inflammatory changes. Spleen: Normal in size. No concerning splenic lesions. Adrenals/Urinary Tract: Normal adrenal glands. Kidneys enhance symmetrically and uniformly. Few subcentimeter hypodense foci too small to fully characterize on CT imaging but statistically likely benign. Stable mild bilateral perinephric stranding, nonspecific. No concerning renal mass, urolithiasis or hydronephrosis. Stomach/Bowel: Distal esophagus, stomach and duodenal sweep are unremarkable. No small bowel wall thickening or dilatation. No evidence of obstruction. Diffusely fluid-filled appearance of the colon with lack of formed stool. No proximal colonic thickening or dilatation. There is marked thickening in stranding which is contiguous from the level of the distal sigmoid to the rectum with edematous mural thickening and serpiginous densities suggesting hemorrhoidal collaterals. Accumulation of attenuation in the folds of the mucosa of the rectum between the precontrast and venous phases is most suggestive mucosal hyperemia. Some reactive stranding noted the adjacent mesentery presacral space. Lymphatic: No suspicious or enlarged lymph nodes in the included lymphatic chains. Reproductive: The  prostate and seminal vesicles are unremarkable. Other: Inflammatory changes centered upon the rectosigmoid as above. No abdominopelvic free air. No pneumatosis or portal venous gas. No bowel containing hernias. Bilateral fat containing inguinal hernias, left greater than right. Musculoskeletal: The osseous structures appear diffusely demineralized which may limit detection of small or nondisplaced fractures. Subacute appearing compression deformities  at T12 and L2 with superimposed Schmorl's node formations are new since the 2018 comparison with a to 50% height loss at T12 and 40% height loss at L2. No other acute or suspicious osseous abnormalities. Multilevel degenerative changes are present in the imaged portions of the spine. Features most pronounced L5-S1. Additional degenerative changes in the hips and pelvis with partial ankylosis across the SI joints. IMPRESSION: VASCULAR. 1. Circumferential thickening of the rectosigmoid with associated inflammatory changes of the colon compatible with a nonspecific colitis. Accumulation of attenuation in the folds of the rectum between the precontrast and venous phases is most suggestive of mucosal hyperemia associated with the more diffuse colitis as well as hemorrhoidal collaterals. 2. Interval increase in size of the large infrarenal abdominal aortic aneurysm extending into the iliac arteries, now measuring up to 7.3 cm in diameter with progressive accumulation of the endoluminal plaque resulting in luminal narrowing with the residual aortic lumen measuring up to 1.9 cm. 3. Interval increase in size of the bilateral common iliac artery aneurysmal dilatation as well, now measuring up to 4.7 cm in diameter on the right and 3.5 cm on the left. 4. Aortic Atherosclerosis (ICD10-I70.0). 5. Moderate to severe stenosis of the left internal iliac artery origin. 6. Mild plaque narrowing at the celiac axis as well as a focal like configuration suggesting some compression by the  median arcuate ligament with poststenotic dilatation. 7. Mild stenosis of the right external iliac origin. 8. Segmental occlusion of the IMA origin with more distal opacification likely supplied via left colic collateralization. 9. Mild ostial narrowing of the SMA and bilateral renal artery origins. 10. Coronary artery atherosclerosis. 11. Features suggest mild lipomatous hypertrophy of the inter-atrial septum. 12. Infrarenal caval filter is in similar position to prior with some lateral tilt. NON-VASCULAR 1. Subacute appearing compression deformities at T12 and L2 with superimposed Schmorl's node formations are new since the 2018 comparison with a to 50% height loss at T12 and 40% height loss at L2. 2. Stable mild bilateral perinephric stranding, nonspecific. 3. Hyperdense material in the gallbladder, possibly biliary sludge a sand. Correlate for right upper quadrant symptoms. Electronically Signed   By: Lovena Le M.D.   On: 01/24/2020 22:29               LOS: 0 days   [facility]   Pager [phone]. If 7PM-7AM, please contact night-coverage at [email or web address]     01/25/2020, 3:31 PM

## 2020-01-25 NOTE — ED Notes (Signed)
Pt hypotensive at this time with no complaints. NP Ouma notified. Pressure reading 88/60. Verbal order to admin NS 1L bolus. See MAR> Orders to keep map >65. Pt A&Ox4

## 2020-01-26 DIAGNOSIS — E785 Hyperlipidemia, unspecified: Secondary | ICD-10-CM

## 2020-01-26 DIAGNOSIS — E1169 Type 2 diabetes mellitus with other specified complication: Secondary | ICD-10-CM

## 2020-01-26 LAB — GLUCOSE, CAPILLARY
Glucose-Capillary: 111 mg/dL — ABNORMAL HIGH (ref 70–99)
Glucose-Capillary: 140 mg/dL — ABNORMAL HIGH (ref 70–99)
Glucose-Capillary: 110 mg/dL — ABNORMAL HIGH (ref 70–99)
Glucose-Capillary: 187 mg/dL — ABNORMAL HIGH (ref 70–99)

## 2020-01-26 MED ORDER — CLOPIDOGREL BISULFATE 75 MG PO TABS
75.0000 mg | ORAL_TABLET | Freq: Every day | ORAL | Status: DC
  Administered 2020-01-26 – 2020-01-30 (×5): 75 mg via ORAL
  Filled 2020-01-26 (×5): qty 1

## 2020-01-26 MED ORDER — ASPIRIN EC 81 MG PO TBEC
81.0000 mg | DELAYED_RELEASE_TABLET | Freq: Every day | ORAL | Status: DC
  Administered 2020-01-26 – 2020-01-30 (×5): 81 mg via ORAL
  Filled 2020-01-26 (×6): qty 1

## 2020-01-26 NOTE — Progress Notes (Addendum)
Progress Note    ANSON PEDDIE  BWG:[id number] DOB: 1955-08-07  DOA: 01/24/2020 PCP: Alvester Morin, MD      Brief Narrative:    Medical records reviewed and are as summarized below:  Myrna Blazer is a 64 y.o. male       Assessment/Plan:   Principal Problem:   Acute colitis Active Problems:   Left hemiplegia ([place])   Benign essential HTN   Chronic obstructive pulmonary disease ([place])   Abdominal aortic aneurysm (AAA) greater than 5.5 cm in diameter in male [facility])   Type 2 diabetes mellitus with hyperlipidemia ([place])   Hematochezia   Acute colitis, hematochezia-improving Infrarenal abdominal aortic aneurysm (7.3 cm), bilateral common iliac artery aneurysm (4.7 cm on the right and 3.5 cm on the left) Bilateral carotid artery stenosis: Complete occlusion of right ICA, left 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 Discontinue IV fluids and advance diet to heart healthy/carb controlled diet. Continue oral Protonix Resume aspirin and Plavix. He has been evaluated by vascular surgeon plan for AAA repair on Tuesday,  01/29/2020 Continue antihypertensives NovoLog as needed for hyperglycemia Continue Lipitor Continue bronchodilators Continue antiepileptics    Body mass index is 26.63 kg/m.  Diet Order            Diet heart healthy/carb modified Room service appropriate? Yes; Fluid consistency: Thin  Diet effective now                       Medications:   . amLODipine  5 mg Oral Daily  . aspirin EC  81 mg Oral Daily  . atorvastatin  20 mg Oral q1800  . clopidogrel  75 mg Oral Daily  . divalproex  125 mg Oral QHS  . enoxaparin (LOVENOX) injection  40 mg Subcutaneous Q24H  . gabapentin  300 mg Oral  Daily  . insulin aspart  0-5 Units Subcutaneous QHS  . insulin aspart  0-9 Units Subcutaneous TID WC  . levETIRAcetam  250 mg Oral BID  . lisinopril  40 mg Oral Daily  . metoprolol tartrate  12.5 mg Oral BID  . mometasone-formoterol  2 puff Inhalation BID  . pantoprazole (PROTONIX) IV  40 mg Intravenous q1800  . potassium chloride SA  20 mEq Oral BID  . sertraline  100 mg Oral Daily  . sodium chloride flush  10-40 mL Intracatheter Q12H  . tamsulosin  0.4 mg Oral QPC supper   Continuous Infusions: . ciprofloxacin Stopped (01/26/20 1014)  . metronidazole Stopped (01/26/20 0856)     Anti-infectives (From admission, onward)   Start     Dose/Rate Route Frequency Ordered Stop   01/25/20 1000  ciprofloxacin (CIPRO) IVPB 400 mg     Discontinue     400 mg 200 mL/hr over 60 Minutes Intravenous Every 12 hours 01/24/20 2326     01/25/20 0800  metroNIDAZOLE (FLAGYL) IVPB 500 mg     Discontinue     500 mg 100 mL/hr over 60 Minutes Intravenous Every 8 hours 01/24/20 2326     01/24/20 2300  ciprofloxacin (CIPRO) IVPB 400 mg        400 mg 200 mL/hr over 60 Minutes Intravenous  Once 01/24/20 2250 01/25/20 0200   01/24/20 2300  metroNIDAZOLE (FLAGYL) IVPB 500 mg        500 mg 100 mL/hr over 60 Minutes Intravenous  Once 01/24/20 2250 01/25/20 0421             Family Communication/Anticipated D/C date and plan/Code Status   DVT prophylaxis: enoxaparin (LOVENOX) injection 40 mg Start: 01/25/20 1000     Code Status: Full Code  Family Communication: Plan discussed with patient Disposition Plan:    Status is: Inpatient  Remains inpatient appropriate because:IV treatments appropriate due to intensity of illness or inability to take PO and Inpatient level of care appropriate due to severity of illness   Dispo: The patient is from: Home              Anticipated d/c is to: Home              Anticipated d/c date is: > 3 days              Patient currently is not medically stable to  d/c.                 Subjective:   No abdominal pain, vomiting or diarrhea.  Objective:    Vitals:   01/25/20 1950 01/25/20 2026 01/26/20 0424 01/26/20 0736  BP: (!) 147/81 137/86 (!) 154/87 (!) 162/88  Pulse: 71 71 65 61  Resp:  18 16 20   Temp:  97.8 F (36.6 C) 98.1 F (36.7 C) (!) 97.5 F (36.4 C)  TempSrc:  Oral Oral Oral  SpO2: 95% 97% 97% 97%  Weight:  81.8 kg    Height:  5\' 9"  (1.753 m)     No data found.   Intake/Output Summary (Last 24 hours) at 01/26/2020 1144 Last data filed at 01/26/2020 1100 Gross per 24 hour  Intake 2205.22 ml  Output 800 ml  Net 1405.22 ml   Filed Weights   01/24/20 1921 01/25/20 2026  Weight: 80.3 kg 81.8 kg    Exam:  GEN: No acute distress SKIN: No rash EYES: EOMI, blind in right eye ENT: MMM CV: RRR PULM: No wheezing or rales. ABD: soft, ND, still has mild left lower quadrant tenderness, no rebound tenderness or guarding, +BS CNS: AAO x 3, left-sided hemiplegia EXT: No edema or tenderness   Data Reviewed:   I have personally reviewed following labs and imaging studies:  Labs: Labs show the following:   Basic Metabolic Panel: Recent Labs  Lab 01/24/20 2043  NA 141  K 3.9  CL 107  CO2 24  GLUCOSE 164*  BUN 23  CREATININE 1.07  CALCIUM 9.0   GFR Estimated Creatinine Clearance: 69.7 mL/min (by C-G formula based on SCr of 1.07 mg/dL). Liver Function Tests: Recent Labs  Lab 01/24/20 2043  AST 29  ALT 26  ALKPHOS 65  BILITOT 1.1  PROT 7.6  ALBUMIN 3.7   Recent Labs  Lab 01/24/20 2043  LIPASE 29   No results for input(s): AMMONIA in the last 168 hours. Coagulation profile Recent Labs  Lab 01/24/20 2043  INR 1.0    CBC: Recent Labs  Lab 01/24/20 2043 01/25/20 0733  WBC 16.9* 10.5  HGB 15.7 13.2  HCT 45.6 38.5*  MCV 96.0 97.0  PLT 197 166   Cardiac Enzymes: No results for input(s): CKTOTAL, CKMB, CKMBINDEX, TROPONINI in the last 168 hours. BNP (last 3 results) No results  for input(s): PROBNP in the last 8760 hours. CBG: Recent Labs  Lab 01/25/20 0934 01/25/20 1339 01/25/20 1718 01/25/20 2048  01/26/20 0737  GLUCAP 111* 143* 119* 143* 111*   D-Dimer: No results for input(s): DDIMER in the last 72 hours. Hgb A1c: Recent Labs    01/24/20 2043  HGBA1C 6.5*   Lipid Profile: No results for input(s): CHOL, HDL, LDLCALC, TRIG, CHOLHDL, LDLDIRECT in the last 72 hours. Thyroid function studies: No results for input(s): TSH, T4TOTAL, T3FREE, THYROIDAB in the last 72 hours.  Invalid input(s): FREET3 Anemia work up: No results for input(s): VITAMINB12, FOLATE, FERRITIN, TIBC, IRON, RETICCTPCT in the last 72 hours. Sepsis Labs: Recent Labs  Lab 01/24/20 2043 01/24/20 2247 01/25/20 0733  WBC 16.9*  --  10.5  LATICACIDVEN  --  1.6  --     Microbiology Recent Results (from the past 240 hour(s))  SARS Coronavirus 2 by RT PCR (hospital order, performed in [facility] hospital lab) Nasopharyngeal Nasopharyngeal Swab     Status: None   Collection Time: 01/24/20  8:04 PM   Specimen: Nasopharyngeal Swab  Result Value Ref Range Status   SARS Coronavirus 2 NEGATIVE NEGATIVE Final    Comment: (NOTE) SARS-CoV-2 target nucleic acids are NOT DETECTED.  The SARS-CoV-2 RNA is generally detectable in upper and lower respiratory specimens during the acute phase of infection. The lowest concentration of SARS-CoV-2 viral copies this assay can detect is 250 copies / mL. A negative result does not preclude SARS-CoV-2 infection and should not be used as the sole basis for treatment or other patient management decisions.  A negative result may occur with improper specimen collection / handling, submission of specimen other than nasopharyngeal swab, presence of viral mutation(s) within the areas targeted by this assay, and inadequate number of viral copies (<250 copies / mL). A negative result must be combined with clinical observations, patient history, and  epidemiological information.  Fact Sheet for Patients:   StrictlyIdeas.no  Fact Sheet for Healthcare Providers: BankingDealers.co.za  This test is not yet approved or  cleared by the [place] FDA and has been authorized for detection and/or diagnosis of SARS-CoV-2 by FDA under an Emergency Use Authorization (EUA).  This EUA will remain in effect (meaning this test can be used) for the duration of the COVID-19 declaration under Section 564(b)(1) of the Act, 21 U.S.C. section 360bbb-3(b)(1), unless the authorization is terminated or revoked sooner.  Performed at [facility], [address] [place], [place] [address]     Procedures and diagnostic studies:  CT Angio Abd/Pel W and/or Wo Contrast  Result Date: 01/24/2020 CLINICAL DATA:  Diarrhea and rectal bleeding which began today with nausea for 4 weeks EXAM: CTA ABDOMEN AND PELVIS WITHOUT AND WITH CONTRAST TECHNIQUE: Multidetector CT imaging of the abdomen and pelvis was performed using the standard protocol during bolus administration of intravenous contrast. Multiplanar reconstructed images and MIPs were obtained and reviewed to evaluate the vascular anatomy. CONTRAST:  139mL OMNIPAQUE IOHEXOL 350 MG/ML SOLN COMPARISON:  CT angiography abdomen pelvis 05/19/2017 FINDINGS: VASCULAR Aorta: There is extensive atherosclerotic plaque throughout the native abdominal aorta. Redemonstration of the large fusiform aneurysm of the infrarenal abdominal aorta which does extend into the iliac bifurcation. Maximal is aortic diameter to 7.3 cm is increased from the 6 point 6 cm diameter at a similar level on comparison imaging. Additionally, there has been progressive accumulation of the endoluminal plaque resulting in luminal narrowing with the residual aortic lumen measuring up to 1.9 cm, previously 2.7 cm. Aneurysmal dilatation begins just below the left renal artery origin. Celiac: Hook like  configuration of the celiac origin  compatible with compression by the median arcuate ligament likely with some slight contribution of mild calcified ostial plaque which results in some moderate luminal narrowing with poststenotic dilatation to 8 mm, the overall configuration of which is similar to comparison. Otherwise normal opacification with typical branching pattern. No acute abnormality SMA: Mild ostial plaque narrowing of the SMA origin. Vessel otherwise normally opacified without other significant stenosis. Typical branching pattern. No evidence of aneurysm, dissection or vasculitis. Renals: Single renal arteries bilaterally. Mild plaque narrowing at both renal ostia. No other significant stenosis however with normal opacification. No evidence of aneurysm, dissection or vasculitis. No features of fibromuscular dysplasia. IMA: Segmental occlusion of the IMA origin with more distal opacification likely supplied via left colic collateralization. No other acute abnormality. Inflow: Extension of the large fusiform aneurysm into the common iliac artery origins. The right common iliac measuring up to 4.7 cm in diameter, left common iliac measuring up to 3.5 cm in diameter, increased from 4.1 and 2.4 cm respectively. The right common iliac dilatation extends to the bifurcation with the internal and external iliac arteries. Moderate to severe stenosis at the origin of the left internal iliac but with normal distal opacification. Mild stenosis at the right external iliac origin. The left common iliac artery returns to normal caliber by the mid vessel with some moderate plaque at the internal iliac artery origin and some segmental narrowing at the branches of the internal and external divisions as well as additional moderate narrowing at the external iliac origin with multifocal segmental narrowing to the outflow levels. Proximal Outflow: Atheromatous plaque is present in the bilateral common femoral arteries without  significant flow-limiting stenosis at the origins of the superficial femoral and profundus arteries bilaterally. No acute luminal abnormality is seen. Veins: Venous phase imaging demonstrates normal opacification of the hepatic and portal veins. Infrarenal caval filter is in similar position to prior with some lateral tilt. No other significant venous findings Review of the MIP images confirms the above findings. NON-VASCULAR Lower chest: Dependent atelectatic changes are present in the lung bases. Normal heart size. No pericardial effusion. Increased fat attenuation at the interatrial septum sparing the fossa ovalis. Could reflect mild in lipomatous hypertrophy of the intra-atrial septum. Hepatobiliary: No worrisome focal liver lesions. Smooth liver surface contour. Normal hepatic attenuation. Some dependently layering density in the gallbladder may reflect biliary sand or sludge. No visible calcified gallstones. No biliary ductal dilatation. Pancreas: Unremarkable. No pancreatic ductal dilatation or surrounding inflammatory changes. Spleen: Normal in size. No concerning splenic lesions. Adrenals/Urinary Tract: Normal adrenal glands. Kidneys enhance symmetrically and uniformly. Few subcentimeter hypodense foci too small to fully characterize on CT imaging but statistically likely benign. Stable mild bilateral perinephric stranding, nonspecific. No concerning renal mass, urolithiasis or hydronephrosis. Stomach/Bowel: Distal esophagus, stomach and duodenal sweep are unremarkable. No small bowel wall thickening or dilatation. No evidence of obstruction. Diffusely fluid-filled appearance of the colon with lack of formed stool. No proximal colonic thickening or dilatation. There is marked thickening in stranding which is contiguous from the level of the distal sigmoid to the rectum with edematous mural thickening and serpiginous densities suggesting hemorrhoidal collaterals. Accumulation of attenuation in the folds of  the mucosa of the rectum between the precontrast and venous phases is most suggestive mucosal hyperemia. Some reactive stranding noted the adjacent mesentery presacral space. Lymphatic: No suspicious or enlarged lymph nodes in the included lymphatic chains. Reproductive: The prostate and seminal vesicles are unremarkable. Other: Inflammatory changes centered upon the rectosigmoid as above. No  abdominopelvic free air. No pneumatosis or portal venous gas. No bowel containing hernias. Bilateral fat containing inguinal hernias, left greater than right. Musculoskeletal: The osseous structures appear diffusely demineralized which may limit detection of small or nondisplaced fractures. Subacute appearing compression deformities at T12 and L2 with superimposed Schmorl's node formations are new since the 2018 comparison with a to 50% height loss at T12 and 40% height loss at L2. No other acute or suspicious osseous abnormalities. Multilevel degenerative changes are present in the imaged portions of the spine. Features most pronounced L5-S1. Additional degenerative changes in the hips and pelvis with partial ankylosis across the SI joints. IMPRESSION: VASCULAR. 1. Circumferential thickening of the rectosigmoid with associated inflammatory changes of the colon compatible with a nonspecific colitis. Accumulation of attenuation in the folds of the rectum between the precontrast and venous phases is most suggestive of mucosal hyperemia associated with the more diffuse colitis as well as hemorrhoidal collaterals. 2. Interval increase in size of the large infrarenal abdominal aortic aneurysm extending into the iliac arteries, now measuring up to 7.3 cm in diameter with progressive accumulation of the endoluminal plaque resulting in luminal narrowing with the residual aortic lumen measuring up to 1.9 cm. 3. Interval increase in size of the bilateral common iliac artery aneurysmal dilatation as well, now measuring up to 4.7 cm in  diameter on the right and 3.5 cm on the left. 4. Aortic Atherosclerosis (ICD10-I70.0). 5. Moderate to severe stenosis of the left internal iliac artery origin. 6. Mild plaque narrowing at the celiac axis as well as a focal like configuration suggesting some compression by the median arcuate ligament with poststenotic dilatation. 7. Mild stenosis of the right external iliac origin. 8. Segmental occlusion of the IMA origin with more distal opacification likely supplied via left colic collateralization. 9. Mild ostial narrowing of the SMA and bilateral renal artery origins. 10. Coronary artery atherosclerosis. 11. Features suggest mild lipomatous hypertrophy of the inter-atrial septum. 12. Infrarenal caval filter is in similar position to prior with some lateral tilt. NON-VASCULAR 1. Subacute appearing compression deformities at T12 and L2 with superimposed Schmorl's node formations are new since the 2018 comparison with a to 50% height loss at T12 and 40% height loss at L2. 2. Stable mild bilateral perinephric stranding, nonspecific. 3. Hyperdense material in the gallbladder, possibly biliary sludge a sand. Correlate for right upper quadrant symptoms. Electronically Signed   By: Lovena Le M.D.   On: 01/24/2020 22:29               LOS: 1 day   [facility]   Pager [phone]. If 7PM-7AM, please contact night-coverage at [email or web address]     01/26/2020, 11:44 AM

## 2020-01-26 NOTE — Plan of Care (Signed)
Continuing with plan of care. 

## 2020-01-27 LAB — CBC WITH DIFFERENTIAL/PLATELET
Abs Immature Granulocytes: 0.03 10*3/uL (ref 0.00–0.07)
Basophils Absolute: 0.1 10*3/uL (ref 0.0–0.1)
Basophils Relative: 1 %
Eosinophils Absolute: 0.1 10*3/uL (ref 0.0–0.5)
Eosinophils Relative: 1 %
HCT: 40.3 % (ref 39.0–52.0)
Hemoglobin: 14 g/dL (ref 13.0–17.0)
Immature Granulocytes: 0 %
Lymphocytes Relative: 23 %
Lymphs Abs: 1.8 10*3/uL (ref 0.7–4.0)
MCH: 32.9 pg (ref 26.0–34.0)
MCHC: 34.7 g/dL (ref 30.0–36.0)
MCV: 94.6 fL (ref 80.0–100.0)
Monocytes Absolute: 0.4 10*3/uL (ref 0.1–1.0)
Monocytes Relative: 5 %
Neutro Abs: 5.3 10*3/uL (ref 1.7–7.7)
Neutrophils Relative %: 70 %
Platelets: 177 10*3/uL (ref 150–400)
RBC: 4.26 MIL/uL (ref 4.22–5.81)
RDW: 12.2 % (ref 11.5–15.5)
WBC: 7.7 10*3/uL (ref 4.0–10.5)
nRBC: 0 % (ref 0.0–0.2)

## 2020-01-27 LAB — GLUCOSE, CAPILLARY
Glucose-Capillary: 123 mg/dL — ABNORMAL HIGH (ref 70–99)
Glucose-Capillary: 145 mg/dL — ABNORMAL HIGH (ref 70–99)
Glucose-Capillary: 149 mg/dL — ABNORMAL HIGH (ref 70–99)
Glucose-Capillary: 159 mg/dL — ABNORMAL HIGH (ref 70–99)

## 2020-01-27 LAB — BASIC METABOLIC PANEL
Anion gap: 7 (ref 5–15)
BUN: 9 mg/dL (ref 8–23)
CO2: 26 mmol/L (ref 22–32)
Calcium: 8.6 mg/dL — ABNORMAL LOW (ref 8.9–10.3)
Chloride: 104 mmol/L (ref 98–111)
Creatinine, Ser: 0.93 mg/dL (ref 0.61–1.24)
GFR calc Af Amer: >60 mL/min (ref >60)
GFR calc non Af Amer: >60 mL/min (ref >60)
Glucose, Bld: 127 mg/dL — ABNORMAL HIGH (ref 70–99)
Potassium: 3.8 mmol/L (ref 3.5–5.1)
Sodium: 137 mmol/L (ref 135–145)

## 2020-01-27 NOTE — Plan of Care (Signed)
Continuing with plan of care. 

## 2020-01-27 NOTE — Progress Notes (Signed)
Progress Note    REDFORD BEHRLE  LZJ:[id number] DOB: 03-08-1956  DOA: 01/24/2020 PCP: Alvester Morin, MD      Brief Narrative:    Medical records reviewed and are as summarized below:  Myrna Blazer is a 64 y.o. male       Assessment/Plan:   Principal Problem:   Acute colitis Active Problems:   Left hemiplegia ([place])   Benign essential HTN   Chronic obstructive pulmonary disease ([place])   Abdominal aortic aneurysm (AAA) greater than 5.5 cm in diameter in male [facility])   Type 2 diabetes mellitus with hyperlipidemia ([place])   Hematochezia   Acute colitis, hematochezia-improving Infrarenal abdominal aortic aneurysm (7.3 cm), bilateral common iliac artery aneurysm (4.7 cm on the right and 3.5 cm on the left) Bilateral carotid artery stenosis: Complete occlusion of right ICA, left 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 Continue oral Protonix Continue aspirin and Plavix. He has been evaluated by vascular surgeon plan for AAA repair on Tuesday,  01/29/2020 Continue antihypertensives NovoLog as needed for hyperglycemia Continue Lipitor Continue bronchodilators Continue antiepileptics Plan of care was discussed with his son Mr. Gardiner [place], on 01/26/2020   Body mass index is 26.63 kg/m.  Diet Order            Diet heart healthy/carb modified Room service appropriate? Yes; Fluid consistency: Thin  Diet effective now                       Medications:   . amLODipine  5 mg Oral Daily  . aspirin EC  81 mg Oral Daily  . atorvastatin  20 mg Oral q1800  . clopidogrel  75 mg Oral Daily  . divalproex  125 mg Oral QHS  . enoxaparin (LOVENOX) injection  40 mg Subcutaneous Q24H  . gabapentin  300 mg Oral Daily   . insulin aspart  0-5 Units Subcutaneous QHS  . insulin aspart  0-9 Units Subcutaneous TID WC  . levETIRAcetam  250 mg Oral BID  . lisinopril  40 mg Oral Daily  . metoprolol tartrate  12.5 mg Oral BID  . mometasone-formoterol  2 puff Inhalation BID  . pantoprazole (PROTONIX) IV  40 mg Intravenous q1800  . potassium chloride SA  20 mEq Oral BID  . sertraline  100 mg Oral Daily  . sodium chloride flush  10-40 mL Intracatheter Q12H  . tamsulosin  0.4 mg Oral QPC supper   Continuous Infusions: . ciprofloxacin 400 mg (01/27/20 1059)  . metronidazole 500 mg (01/27/20 0900)     Anti-infectives (From admission, onward)   Start     Dose/Rate Route Frequency Ordered Stop   01/25/20 1000  ciprofloxacin (CIPRO) IVPB 400 mg     Discontinue     400 mg 200 mL/hr over 60 Minutes Intravenous Every 12 hours 01/24/20 2326     01/25/20 0800  metroNIDAZOLE (FLAGYL) IVPB 500 mg     Discontinue     500 mg 100 mL/hr over 60 Minutes Intravenous Every 8 hours 01/24/20 2326     01/24/20 2300  ciprofloxacin (CIPRO) IVPB 400 mg        400 mg 200 mL/hr over 60 Minutes Intravenous  Once 01/24/20 2250 01/25/20 0200  01/24/20 2300  metroNIDAZOLE (FLAGYL) IVPB 500 mg        500 mg 100 mL/hr over 60 Minutes Intravenous  Once 01/24/20 2250 01/25/20 0421             Family Communication/Anticipated D/C date and plan/Code Status   DVT prophylaxis: enoxaparin (LOVENOX) injection 40 mg Start: 01/25/20 1000     Code Status: Full Code  Family Communication: Plan discussed with patient Disposition Plan:    Status is: Inpatient  Remains inpatient appropriate because:IV treatments appropriate due to intensity of illness or inability to take PO and Inpatient level of care appropriate due to severity of illness   Dispo: The patient is from: Home              Anticipated d/c is to: Home              Anticipated d/c date is: > 3 days              Patient currently is not medically stable to  d/c.                 Subjective:   He is tolerating regular diet without any worsening symptoms.  He said he only has a slight lower abdominal pain.  Objective:    Vitals:   01/26/20 0736 01/26/20 1144 01/26/20 1939 01/27/20 0437  BP: (!) 162/88 132/72 107/62 (!) 140/92  Pulse: 61 61 80 67  Resp: 20 20 16 16   Temp: (!) 97.5 F (36.4 C) 98.2 F (36.8 C) 98.5 F (36.9 C) 98.2 F (36.8 C)  TempSrc: Oral Oral Oral Oral  SpO2: 97% 97% 94% 95%  Weight:      Height:       No data found.   Intake/Output Summary (Last 24 hours) at 01/27/2020 1131 Last data filed at 01/27/2020 0900 Gross per 24 hour  Intake 610 ml  Output 2100 ml  Net -1490 ml   Filed Weights   01/24/20 1921 01/25/20 2026  Weight: 80.3 kg 81.8 kg    Exam:  GEN: No acute distress SKIN: Warm and dry EYES: EOMI, blind in right eye ENT: MMM CV: RRR PULM: CTA b/l ABD: soft, ND, slight tenderness in LLQ, no rebound tenderness or guarding, +BS CNS: AAO x 3, left-sided hemiplegia EXT: No edema or tenderness      Data Reviewed:   I have personally reviewed following labs and imaging studies:  Labs: Labs show the following:   Basic Metabolic Panel: Recent Labs  Lab 01/24/20 2043 01/27/20 0606  NA 141 137  K 3.9 3.8  CL 107 104  CO2 24 26  GLUCOSE 164* 127*  BUN 23 9  CREATININE 1.07 0.93  CALCIUM 9.0 8.6*   GFR Estimated Creatinine Clearance: 80.2 mL/min (by C-G formula based on SCr of 0.93 mg/dL). Liver Function Tests: Recent Labs  Lab 01/24/20 2043  AST 29  ALT 26  ALKPHOS 65  BILITOT 1.1  PROT 7.6  ALBUMIN 3.7   Recent Labs  Lab 01/24/20 2043  LIPASE 29   No results for input(s): AMMONIA in the last 168 hours. Coagulation profile Recent Labs  Lab 01/24/20 2043  INR 1.0    CBC: Recent Labs  Lab 01/24/20 2043 01/25/20 0733 01/27/20 0606  WBC 16.9* 10.5 7.7  NEUTROABS  --   --  5.3  HGB 15.7 13.2 14.0  HCT 45.6 38.5* 40.3  MCV 96.0 97.0 94.6  PLT  197 166 177   Cardiac Enzymes:  No results for input(s): CKTOTAL, CKMB, CKMBINDEX, TROPONINI in the last 168 hours. BNP (last 3 results) No results for input(s): PROBNP in the last 8760 hours. CBG: Recent Labs  Lab 01/26/20 0737 01/26/20 1144 01/26/20 1826 01/26/20 2128 01/27/20 0802  GLUCAP 111* 140* 110* 187* 123*   D-Dimer: No results for input(s): DDIMER in the last 72 hours. Hgb A1c: Recent Labs    01/24/20 2043  HGBA1C 6.5*   Lipid Profile: No results for input(s): CHOL, HDL, LDLCALC, TRIG, CHOLHDL, LDLDIRECT in the last 72 hours. Thyroid function studies: No results for input(s): TSH, T4TOTAL, T3FREE, THYROIDAB in the last 72 hours.  Invalid input(s): FREET3 Anemia work up: No results for input(s): VITAMINB12, FOLATE, FERRITIN, TIBC, IRON, RETICCTPCT in the last 72 hours. Sepsis Labs: Recent Labs  Lab 01/24/20 2043 01/24/20 2247 01/25/20 0733 01/27/20 0606  WBC 16.9*  --  10.5 7.7  LATICACIDVEN  --  1.6  --   --     Microbiology Recent Results (from the past 240 hour(s))  SARS Coronavirus 2 by RT PCR (hospital order, performed in [facility] hospital lab) Nasopharyngeal Nasopharyngeal Swab     Status: None   Collection Time: 01/24/20  8:04 PM   Specimen: Nasopharyngeal Swab  Result Value Ref Range Status   SARS Coronavirus 2 NEGATIVE NEGATIVE Final    Comment: (NOTE) SARS-CoV-2 target nucleic acids are NOT DETECTED.  The SARS-CoV-2 RNA is generally detectable in upper and lower respiratory specimens during the acute phase of infection. The lowest concentration of SARS-CoV-2 viral copies this assay can detect is 250 copies / mL. A negative result does not preclude SARS-CoV-2 infection and should not be used as the sole basis for treatment or other patient management decisions.  A negative result may occur with improper specimen collection / handling, submission of specimen other than nasopharyngeal swab, presence of viral mutation(s) within the areas  targeted by this assay, and inadequate number of viral copies (<250 copies / mL). A negative result must be combined with clinical observations, patient history, and epidemiological information.  Fact Sheet for Patients:   StrictlyIdeas.no  Fact Sheet for Healthcare Providers: BankingDealers.co.za  This test is not yet approved or  cleared by the [place] FDA and has been authorized for detection and/or diagnosis of SARS-CoV-2 by FDA under an Emergency Use Authorization (EUA).  This EUA will remain in effect (meaning this test can be used) for the duration of the COVID-19 declaration under Section 564(b)(1) of the Act, 21 U.S.C. section 360bbb-3(b)(1), unless the authorization is terminated or revoked sooner.  Performed at [facility], [address] [place], [place] [address]     Procedures and diagnostic studies:  No results found.             LOS: 2 days   [facility]   Pager [phone]. If 7PM-7AM, please contact night-coverage at [email or web address]     01/27/2020, 11:31 AM

## 2020-01-28 ENCOUNTER — Other Ambulatory Visit (INDEPENDENT_AMBULATORY_CARE_PROVIDER_SITE_OTHER): Payer: Self-pay | Admitting: Vascular Surgery

## 2020-01-28 LAB — GLUCOSE, CAPILLARY
Glucose-Capillary: 125 mg/dL — ABNORMAL HIGH (ref 70–99)
Glucose-Capillary: 160 mg/dL — ABNORMAL HIGH (ref 70–99)
Glucose-Capillary: 122 mg/dL — ABNORMAL HIGH (ref 70–99)
Glucose-Capillary: 129 mg/dL — ABNORMAL HIGH (ref 70–99)

## 2020-01-28 MED ORDER — METRONIDAZOLE 500 MG PO TABS
500.0000 mg | ORAL_TABLET | Freq: Three times a day (TID) | ORAL | Status: DC
  Administered 2020-01-28 – 2020-01-29 (×5): 500 mg via ORAL
  Filled 2020-01-28 (×9): qty 1

## 2020-01-28 MED ORDER — SODIUM CHLORIDE 0.9 % IV SOLN: INTRAVENOUS | Status: DC

## 2020-01-28 MED ORDER — CIPROFLOXACIN HCL 500 MG PO TABS
500.0000 mg | ORAL_TABLET | Freq: Two times a day (BID) | ORAL | Status: DC
  Administered 2020-01-28 – 2020-01-29 (×3): 500 mg via ORAL
  Filled 2020-01-28 (×3): qty 1

## 2020-01-28 NOTE — H&P (View-Only) (Signed)
[facility] Daily Progress Note  Subjective: Patient without complaint this AM.  No issues overnight.  Denies any abdominal pain, changes in bowel habits, fever or shortness of breath.  Objective: Vitals:   01/27/20 0437 01/27/20 1143 01/27/20 2011 01/28/20 0415  BP: (!) 140/92 132/89 126/78 (!) 153/83  Pulse: 67 63 66 65  Resp: 16 18 20 20   Temp: 98.2 F (36.8 C) 98.1 F (36.7 C) 98 F (36.7 C) 98.7 F (37.1 C)  TempSrc: Oral Oral Oral Oral  SpO2: 95% 97% 93% 95%  Weight:      Height:        Intake/Output Summary (Last 24 hours) at 01/28/2020 1036 Last data filed at 01/28/2020 0415 Gross per 24 hour  Intake 1058.99 ml  Output 1550 ml  Net -491.01 ml   Physical Exam: A&Ox2, NAD CV: RRR Pulmonary: CTA Bilaterally, Non-labored Abdomen: Soft, Non-tender, Non-distended, (+) Bowel Sounds Vascular: Warm distally to toes, Minimal edema Neuro: Left sided baseline weakness   Laboratory: CBC    Component Value Date/Time   WBC 7.7 01/27/2020 0606   HGB 14.0 01/27/2020 0606   HCT 40.3 01/27/2020 0606   PLT 177 01/27/2020 0606   BMET    Component Value Date/Time   NA 137 01/27/2020 0606   K 3.8 01/27/2020 0606   CL 104 01/27/2020 0606   CO2 26 01/27/2020 0606   GLUCOSE 127 (H) 01/27/2020 0606   BUN 9 01/27/2020 0606   CREATININE 0.93 01/27/2020 0606   CALCIUM 8.6 (L) 01/27/2020 0606   GFRNONAA >60 01/27/2020 0606   GFRAA >60 01/27/2020 0606   Assessment/Planning: The patient is a 64 year old male with multiple medical issues including known abdominal aortic aneurysm now measuring 7.3 cm  1) Abdominal Aortic Aneurysm: Patient now on antibiotics x4 days Normal WBC and afebrile Discussed in detail procedure, risks and benefits with the patient this AM.  All questions answered.  Patient wishes to proceed. Our team will also reach out to the patient's son to discuss his upcoming procedure. Plan on endovascular abdominal aorta repair with stent graft  tomorrow with Dr. Delana Buening / [facility]  2) Carotid Stenosis: This will be addressed after the patient's endovascular AAA repair. Due to the size of the patient's abdominal aortic aneurysm and risk for rupture this takes precedent. We will plan on carotid work-up after aneurysm. Known right internal carotid artery occlusion with significant disease in the left carotid artery. Continue aspirin, Plavix and statin for medical management.  3) Tobacco Abuse: We had a discussion for approximately three minutes regarding the absolute need for smoking cessation due to the deleterious nature of tobacco on the vascular system. We discussed the tobacco use would diminish patency of any intervention, and likely significantly worsen progression of disease. We discussed multiple agents for quitting including replacement therapy or medications to reduce cravings such as Chantix. The patient voices their understanding of the importance of smoking cessation.  Discussed with Dr. Eber Hong Rashon Westrup PA-C 01/28/2020 10:36 AM

## 2020-01-28 NOTE — TOC Initial Note (Signed)
Transition of Care [facility]) - Initial/Assessment Note    Patient Details  Name: Chad Combs MRN: [id number] Date of Birth: [birth year]  [facility] CM/SW Contact:    Chad Chroman, LCSW Phone Number: 01/28/2020, 12:20 PM  Clinical Narrative: CSW met with patient. No supports at bedside. CSW introduced role and explained that discharge planning would be discussed. Patient stated he is a long-term resident at [facility]. He has been there for two years. No further concerns. CSW encouraged patient to contact CSW as needed. CSW will continue to follow patient for support and facilitate return to SNF once medically stable.                Expected Discharge Plan: Skilled Nursing Facility Barriers to Discharge: Continued Medical Work up   Patient Goals and CMS Choice     Choice offered to / list presented to : NA  Expected Discharge Plan and Services Expected Discharge Plan: [place] Acute Care Choice: Resumption of Svcs/PTA Provider Living arrangements for the past 2 months: [place]                                      Prior Living Arrangements/Services Living arrangements for the past 2 months: [place] Lives with:: Facility Resident Patient language and need for interpreter reviewed:: Yes Do you feel safe going back to the place where you live?: Yes      Need for Family Participation in Patient Care: Yes (Comment) Care giver support system in place?: Yes (comment)   Criminal Activity/Legal Involvement Pertinent to Current Situation/Hospitalization: No - Comment as needed  Activities of Daily Living Home Assistive Devices/Equipment: Wheelchair ADL Screening (condition at time of admission) Patient's cognitive ability adequate to safely complete daily activities?: Yes Is the patient deaf or have difficulty hearing?: No Does the patient have difficulty seeing, even when wearing  glasses/contacts?: Yes (Blind in right eye) Does the patient have difficulty concentrating, remembering, or making decisions?: No Patient able to express need for assistance with ADLs?: Yes Does the patient have difficulty dressing or bathing?: Yes Independently performs ADLs?: No Communication: Independent Dressing (OT): Needs assistance Is this a change from baseline?: Pre-admission baseline Grooming: Needs assistance Is this a change from baseline?: Pre-admission baseline Feeding: Independent Bathing: Needs assistance Is this a change from baseline?: Pre-admission baseline Toileting: Needs assistance Is this a change from baseline?: Pre-admission baseline In/Out Bed: Needs assistance Is this a change from baseline?: Pre-admission baseline Walks in Home: Needs assistance Is this a change from baseline?: Pre-admission baseline Does the patient have difficulty walking or climbing stairs?: Yes Weakness of Legs: Both Weakness of Arms/Hands: Both  Permission Sought/Granted Permission sought to share information with : Facility Art therapist granted to share information with : Yes, Verbal Permission Granted     Permission granted to share info w AGENCY: [facility] SN[facility]        Emotional Assessment Appearance:: Appears stated age Attitude/Demeanor/Rapport: Engaged, Gracious Affect (typically observed): Accepting, Appropriate, Calm, Pleasant Orientation: : Oriented to Self, Oriented to Place, Oriented to  Time, Oriented to Situation Alcohol / Substance Use: Not Applicable Psych Involvement: No (comment)  Admission diagnosis:  Colitis [K52.9] Lower GI bleeding [K92.2] Acute colitis [K52.9] Abdominal aortic aneurysm (AAA) without rupture [facility]) [I71.4] Patient Active Problem List   Diagnosis Date Noted  . Acute  colitis 01/24/2020  . Hematochezia 01/24/2020  . Chest pain 02/17/2018  . UTI (urinary tract infection) 05/19/2017  . Seizure-like activity ([place])  05/19/2017  . Hyperlipidemia 11/08/2016  . Type II diabetes mellitus with neurological manifestations, uncontrolled ([place]) 10/04/2016  . Type 2 diabetes mellitus with hyperlipidemia ([place]) 10/04/2016  . Abdominal aortic aneurysm (AAA) greater than 5.5 cm in diameter in male [facility])   . Acute deep vein thrombosis (DVT) of femoral vein of left lower extremity ([place])   . Pulmonary embolus ([place])   . Adjustment disorder   . Acute right MCA stroke ([place]) 08/25/2016  . Left hemiplegia ([place])   . Dysarthria, post-stroke   . Dysphagia, post-stroke   . Carotid stenosis   . Benign essential HTN   . Leukocytosis   . Polycythemia vera ([place])   . Tobacco abuse   . Chronic obstructive pulmonary disease ([place])   . 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 [place]Phone: [phone]: [phone]    Social Determinants of Health (SDOH) Interventions    Readmission Risk Interventions No flowsheet data found.

## 2020-01-28 NOTE — Care Management Important Message (Signed)
Important Message  Patient Details  Name: Chad Combs MRN: [id number] Date of Birth: 02-23-1956   Medicare Important Message Given:  Yes     Dannette Barbara 01/28/2020, 11:43 AM

## 2020-01-28 NOTE — Progress Notes (Signed)
[facility] Daily Progress Note  Subjective: Patient without complaint this AM.  No issues overnight.  Denies any abdominal pain, changes in bowel habits, fever or shortness of breath.  Objective: Vitals:   01/27/20 0437 01/27/20 1143 01/27/20 2011 01/28/20 0415  BP: (!) 140/92 132/89 126/78 (!) 153/83  Pulse: 67 63 66 65  Resp: 16 18 20 20   Temp: 98.2 F (36.8 C) 98.1 F (36.7 C) 98 F (36.7 C) 98.7 F (37.1 C)  TempSrc: Oral Oral Oral Oral  SpO2: 95% 97% 93% 95%  Weight:      Height:        Intake/Output Summary (Last 24 hours) at 01/28/2020 1036 Last data filed at 01/28/2020 0415 Gross per 24 hour  Intake 1058.99 ml  Output 1550 ml  Net -491.01 ml   Physical Exam: A&Ox2, NAD CV: RRR Pulmonary: CTA Bilaterally, Non-labored Abdomen: Soft, Non-tender, Non-distended, (+) Bowel Sounds Vascular: Warm distally to toes, Minimal edema Neuro: Left sided baseline weakness   Laboratory: CBC    Component Value Date/Time   WBC 7.7 01/27/2020 0606   HGB 14.0 01/27/2020 0606   HCT 40.3 01/27/2020 0606   PLT 177 01/27/2020 0606   BMET    Component Value Date/Time   NA 137 01/27/2020 0606   K 3.8 01/27/2020 0606   CL 104 01/27/2020 0606   CO2 26 01/27/2020 0606   GLUCOSE 127 (H) 01/27/2020 0606   BUN 9 01/27/2020 0606   CREATININE 0.93 01/27/2020 0606   CALCIUM 8.6 (L) 01/27/2020 0606   GFRNONAA >60 01/27/2020 0606   GFRAA >60 01/27/2020 0606   Assessment/Planning: The patient is a 64 year old male with multiple medical issues including known abdominal aortic aneurysm now measuring 7.3 cm  1) Abdominal Aortic Aneurysm: Patient now on antibiotics x4 days Normal WBC and afebrile Discussed in detail procedure, risks and benefits with the patient this AM.  All questions answered.  Patient wishes to proceed. Our team will also reach out to the patient's son to discuss his upcoming procedure. Plan on endovascular abdominal aorta repair with stent graft  tomorrow with Dr. Delana Ruddell / [facility]  2) Carotid Stenosis: This will be addressed after the patient's endovascular AAA repair. Due to the size of the patient's abdominal aortic aneurysm and risk for rupture this takes precedent. We will plan on carotid work-up after aneurysm. Known right internal carotid artery occlusion with significant disease in the left carotid artery. Continue aspirin, Plavix and statin for medical management.  3) Tobacco Abuse: We had a discussion for approximately three minutes regarding the absolute need for smoking cessation due to the deleterious nature of tobacco on the vascular system. We discussed the tobacco use would diminish patency of any intervention, and likely significantly worsen progression of disease. We discussed multiple agents for quitting including replacement therapy or medications to reduce cravings such as Chantix. The patient voices their understanding of the importance of smoking cessation.  Discussed with Dr. Eber Hong [place] Schorr PA-C 01/28/2020 10:36 AM

## 2020-01-28 NOTE — NC FL2 (Signed)
[employer] LEVEL OF CARE SCREENING TOOL     IDENTIFICATION  Patient Name: Chad Combs Birthdate: Dec 04, 1955 Sex: male Admission Date (Current Location): 01/24/2020  [place] and [place] Number:  Engineering geologist and Address:  [facility], [address], [place], [place] [address]      Provider Number: [id number]  Attending Physician Name and Address:  Jennye Boroughs, MD  Relative Name and Phone Number:       Current Level of Care: Hospital Recommended Level of Care: [place] Prior Approval Number:    Date Approved/Denied:   PASRR Number: [id number]  Discharge Plan: SNF    Current Diagnoses: Patient Active Problem List   Diagnosis Date Noted  . Acute colitis 01/24/2020  . Hematochezia 01/24/2020  . Chest pain 02/17/2018  . UTI (urinary tract infection) 05/19/2017  . Seizure-like activity ([place]) 05/19/2017  . Hyperlipidemia 11/08/2016  . Type II diabetes mellitus with neurological manifestations, uncontrolled ([place]) 10/04/2016  . Type 2 diabetes mellitus with hyperlipidemia ([place]) 10/04/2016  . Abdominal aortic aneurysm (AAA) greater than 5.5 cm in diameter in male [facility])   . Acute deep vein thrombosis (DVT) of femoral vein of left lower extremity ([place])   . Pulmonary embolus ([place])   . Adjustment disorder   . Acute right MCA stroke ([place]) 08/25/2016  . Left hemiplegia ([place])   . Dysarthria, post-stroke   . Dysphagia, post-stroke   . Carotid stenosis   . Benign essential HTN   . Leukocytosis   . Polycythemia vera ([place])   . Tobacco abuse   . Chronic obstructive pulmonary disease ([place])   . 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    Orientation RESPIRATION BLADDER Height & Weight     Self, Time, Situation, Place  Normal Incontinent, External catheter Weight: 180 lb 5.4 oz (81.8 kg) Height:  5\' 9"  (175.3 cm)  BEHAVIORAL SYMPTOMS/MOOD NEUROLOGICAL BOWEL NUTRITION STATUS   (None)  (None) Incontinent Diet (Heart healty/carb  modified)  AMBULATORY STATUS COMMUNICATION OF NEEDS Skin     Verbally Normal                       Personal Care Assistance Level of Assistance              Functional Limitations Info  Sight, Hearing, Speech Sight Info: Adequate Hearing Info: Adequate Speech Info: Adequate    SPECIAL CARE FACTORS FREQUENCY                       Contractures Contractures Info: Not present    Additional Factors Info  Code Status, Allergies Code Status Info: Full code Allergies Info: Bee pollen           Current Medications (01/28/2020):  This is the current hospital active medication list Current Facility-Administered Medications  Medication Dose Route Frequency Provider Last Rate Last Admin  . [START ON 01/29/2020] 0.9 %  sodium chloride infusion   Intravenous Continuous Stegmayer, Kimberly A, PA-C      . acetaminophen (TYLENOL) tablet 650 mg  650 mg Oral Q6H PRN Athena Masse, MD   650 mg at 01/26/20 0347   Or  . acetaminophen (TYLENOL) suppository 650 mg  650 mg Rectal Q6H PRN Athena Masse, MD      . amLODipine (NORVASC) tablet 5 mg  5 mg Oral Daily Athena Masse, MD   5 mg at 01/28/20 1108  . aspirin EC tablet 81 mg  81  mg Oral Daily Jennye Boroughs, MD   81 mg at 01/27/20 0902  . atorvastatin (LIPITOR) tablet 20 mg  20 mg Oral q1800 Athena Masse, MD   20 mg at 01/27/20 1602  . ciprofloxacin (CIPRO) tablet 500 mg  500 mg Oral BID Jennye Boroughs, MD   500 mg at 01/28/20 1109  . clopidogrel (PLAVIX) tablet 75 mg  75 mg Oral Daily Jennye Boroughs, MD   75 mg at 01/27/20 0902  . divalproex (DEPAKOTE) DR tablet 125 mg  125 mg Oral QHS Athena Masse, MD   125 mg at 01/27/20 2037  . enoxaparin (LOVENOX) injection 40 mg  40 mg Subcutaneous Q24H Athena Masse, MD   40 mg at 01/27/20 0904  . gabapentin (NEURONTIN) capsule 300 mg  300 mg Oral Daily Athena Masse, MD   300 mg at 01/28/20 1107  . HYDROcodone-acetaminophen (NORCO/VICODIN) 5-325 MG per tablet 1 tablet  1  tablet Oral Daily PRN Athena Masse, MD   1 tablet at 01/27/20 [phone]  . insulin aspart (novoLOG) injection 0-5 Units  0-5 Units Subcutaneous QHS Judd Gaudier V, MD      . insulin aspart (novoLOG) injection 0-9 Units  0-9 Units Subcutaneous TID WC Athena Masse, MD   2 Units at 01/28/20 1200  . levETIRAcetam (KEPPRA) tablet 250 mg  250 mg Oral BID Athena Masse, MD   250 mg at 01/28/20 1107  . lisinopril (ZESTRIL) tablet 40 mg  40 mg Oral Daily Athena Masse, MD   40 mg at 01/28/20 1108  . metoprolol tartrate (LOPRESSOR) tablet 12.5 mg  12.5 mg Oral BID Judd Gaudier V, MD   12.5 mg at 01/28/20 1108  . metroNIDAZOLE (FLAGYL) tablet 500 mg  500 mg Oral Q8H Jennye Boroughs, MD   500 mg at 01/28/20 1106  . mometasone-formoterol (DULERA) 100-5 MCG/ACT inhaler 2 puff  2 puff Inhalation BID Jennye Boroughs, MD   2 puff at 01/28/20 1111  . morphine 2 MG/ML injection 2 mg  2 mg Intravenous Q2H PRN Athena Masse, MD      . ondansetron [facility]) tablet 4 mg  4 mg Oral Q6H PRN Athena Masse, MD       Or  . ondansetron [facility]) injection 4 mg  4 mg Intravenous Q6H PRN Athena Masse, MD      . pantoprazole (PROTONIX) injection 40 mg  40 mg Intravenous q1800 Athena Masse, MD   40 mg at 01/27/20 1603  . potassium chloride SA (KLOR-CON) CR tablet 20 mEq  20 mEq Oral BID Athena Masse, MD   20 mEq at 01/28/20 1107  . sertraline (ZOLOFT) tablet 100 mg  100 mg Oral Daily Jennye Boroughs, MD   100 mg at 01/28/20 1107  . sodium chloride flush (NS) 0.9 % injection 10-40 mL  10-40 mL Intracatheter Q12H Jennye Boroughs, MD   10 mL at 01/27/20 0905  . sodium chloride flush (NS) 0.9 % injection 10-40 mL  10-40 mL Intracatheter PRN Jennye Boroughs, MD      . tamsulosin [facility]) capsule 0.4 mg  0.4 mg Oral QPC supper Athena Masse, MD   0.4 mg at 01/27/20 1602     Discharge Medications: Please see discharge summary for a list of discharge medications.  Relevant Imaging Results:  Relevant Lab  Results:   Additional Information SS#: [id number]  Candie Chroman, LCSW

## 2020-01-28 NOTE — Progress Notes (Signed)
Progress Note    Chad SHANHOLTZER  TDD:[id number] DOB: May 24, 1956  DOA: 01/24/2020 PCP: Alvester Morin, MD      Brief Narrative:    Medical records reviewed and are as summarized below:  Chad Combs is a 64 y.o. male       Assessment/Plan:   Principal Problem:   Acute colitis Active Problems:   Left hemiplegia ([place])   Benign essential HTN   Chronic obstructive pulmonary disease ([place])   Abdominal aortic aneurysm (AAA) greater than 5.5 cm in diameter in male [facility])   Type 2 diabetes mellitus with hyperlipidemia ([place])   Hematochezia   Acute colitis, hematochezia-improving Infrarenal abdominal aortic aneurysm (7.3 cm), bilateral common iliac artery aneurysm (4.7 cm on the right and 3.5 cm on the left) Bilateral carotid artery stenosis: Complete occlusion of right ICA, 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 Continue oral Protonix Continue aspirin and Plavix. He has been evaluated by vascular surgeon plan for AAA repair tomorrow, 01/29/2020 Continue antihypertensives NovoLog as needed for hyperglycemia Continue Lipitor Continue bronchodilators Continue antiepileptics    Body mass index is 26.63 kg/m.  Diet Order            Diet heart healthy/carb modified Room service appropriate? Yes; Fluid consistency: Thin  Diet effective now                       Medications:   . amLODipine  5 mg Oral Daily  . aspirin EC  81 mg Oral Daily  . atorvastatin  20 mg Oral q1800  . ciprofloxacin  500 mg Oral BID  . clopidogrel  75 mg Oral Daily  . divalproex  125 mg Oral QHS  . enoxaparin (LOVENOX) injection  40 mg Subcutaneous Q24H  . gabapentin  300 mg Oral Daily  . insulin aspart  0-5 Units Subcutaneous QHS  .  insulin aspart  0-9 Units Subcutaneous TID WC  . levETIRAcetam  250 mg Oral BID  . lisinopril  40 mg Oral Daily  . metoprolol tartrate  12.5 mg Oral BID  . metroNIDAZOLE  500 mg Oral Q8H  . mometasone-formoterol  2 puff Inhalation BID  . pantoprazole (PROTONIX) IV  40 mg Intravenous q1800  . potassium chloride SA  20 mEq Oral BID  . sertraline  100 mg Oral Daily  . sodium chloride flush  10-40 mL Intracatheter Q12H  . tamsulosin  0.4 mg Oral QPC supper   Continuous Infusions:    Anti-infectives (From admission, onward)   Start     Dose/Rate Route Frequency Ordered Stop   01/28/20 1000  ciprofloxacin (CIPRO) tablet 500 mg     Discontinue     500 mg Oral 2 times daily 01/28/20 0720     01/28/20 0800  metroNIDAZOLE (FLAGYL) tablet 500 mg     Discontinue     500 mg Oral Every 8 hours 01/28/20 0720     01/25/20 1000  ciprofloxacin (CIPRO) IVPB 400 mg  Status:  Discontinued        400 mg 200 mL/hr over 60 Minutes Intravenous Every 12 hours 01/24/20 2326 01/28/20 0720   01/25/20 0800  metroNIDAZOLE (FLAGYL) IVPB 500 mg  Status:  Discontinued  500 mg 100 mL/hr over 60 Minutes Intravenous Every 8 hours 01/24/20 2326 01/28/20 0720   01/24/20 2300  ciprofloxacin (CIPRO) IVPB 400 mg        400 mg 200 mL/hr over 60 Minutes Intravenous  Once 01/24/20 2250 01/25/20 0200   01/24/20 2300  metroNIDAZOLE (FLAGYL) IVPB 500 mg        500 mg 100 mL/hr over 60 Minutes Intravenous  Once 01/24/20 2250 01/25/20 0421             Family Communication/Anticipated D/C date and plan/Code Status   DVT prophylaxis: enoxaparin (LOVENOX) injection 40 mg Start: 01/25/20 1000     Code Status: Full Code  Family Communication: Plan discussed with patient Disposition Plan:    Status is: Inpatient  Remains inpatient appropriate because:IV treatments appropriate due to intensity of illness or inability to take PO and Inpatient level of care appropriate due to severity of illness   Dispo: The  patient is from: Home              Anticipated d/c is to: Home              Anticipated d/c date is: 3 days              Patient currently is not medically stable to d/c.                 Subjective:   He feels better.  No abdominal pain or vomiting.  He is tolerating a regular diet.   Objective:    Vitals:   01/27/20 0437 01/27/20 1143 01/27/20 2011 01/28/20 0415  BP: (!) 140/92 132/89 126/78 (!) 153/83  Pulse: 67 63 66 65  Resp: 16 18 20 20   Temp: 98.2 F (36.8 C) 98.1 F (36.7 C) 98 F (36.7 C) 98.7 F (37.1 C)  TempSrc: Oral Oral Oral Oral  SpO2: 95% 97% 93% 95%  Weight:      Height:       No data found.   Intake/Output Summary (Last 24 hours) at 01/28/2020 1035 Last data filed at 01/28/2020 0415 Gross per 24 hour  Intake 1058.99 ml  Output 1550 ml  Net -491.01 ml   Filed Weights   01/24/20 1921 01/25/20 2026  Weight: 80.3 kg 81.8 kg    Exam:  GEN: No acute distress SKIN: Warm and dry EYES: EOMI, blind in right eye ENT: MMM CV: RRR PULM: No wheezing or rales. ABD: soft, ND, nontender, +BS CNS: AAO x 3, left-sided hemiplegia EXT: No edema or tenderness      Data Reviewed:   I have personally reviewed following labs and imaging studies:  Labs: Labs show the following:   Basic Metabolic Panel: Recent Labs  Lab 01/24/20 2043 01/27/20 0606  NA 141 137  K 3.9 3.8  CL 107 104  CO2 24 26  GLUCOSE 164* 127*  BUN 23 9  CREATININE 1.07 0.93  CALCIUM 9.0 8.6*   GFR Estimated Creatinine Clearance: 80.2 mL/min (by C-G formula based on SCr of 0.93 mg/dL). Liver Function Tests: Recent Labs  Lab 01/24/20 2043  AST 29  ALT 26  ALKPHOS 65  BILITOT 1.1  PROT 7.6  ALBUMIN 3.7   Recent Labs  Lab 01/24/20 2043  LIPASE 29   No results for input(s): AMMONIA in the last 168 hours. Coagulation profile Recent Labs  Lab 01/24/20 2043  INR 1.0    CBC: Recent Labs  Lab 01/24/20 2043 01/25/20 0733 01/27/20 0606  WBC  16.9*  10.5 7.7  NEUTROABS  --   --  5.3  HGB 15.7 13.2 14.0  HCT 45.6 38.5* 40.3  MCV 96.0 97.0 94.6  PLT 197 166 177   Cardiac Enzymes: No results for input(s): CKTOTAL, CKMB, CKMBINDEX, TROPONINI in the last 168 hours. BNP (last 3 results) No results for input(s): PROBNP in the last 8760 hours. CBG: Recent Labs  Lab 01/27/20 0802 01/27/20 1144 01/27/20 1647 01/27/20 2132 01/28/20 0726  GLUCAP 123* 145* 149* 159* 125*   D-Dimer: No results for input(s): DDIMER in the last 72 hours. Hgb A1c: No results for input(s): HGBA1C in the last 72 hours. Lipid Profile: No results for input(s): CHOL, HDL, LDLCALC, TRIG, CHOLHDL, LDLDIRECT in the last 72 hours. Thyroid function studies: No results for input(s): TSH, T4TOTAL, T3FREE, THYROIDAB in the last 72 hours.  Invalid input(s): FREET3 Anemia work up: No results for input(s): VITAMINB12, FOLATE, FERRITIN, TIBC, IRON, RETICCTPCT in the last 72 hours. Sepsis Labs: Recent Labs  Lab 01/24/20 2043 01/24/20 2247 01/25/20 0733 01/27/20 0606  WBC 16.9*  --  10.5 7.7  LATICACIDVEN  --  1.6  --   --     Microbiology Recent Results (from the past 240 hour(s))  SARS Coronavirus 2 by RT PCR (hospital order, performed in [facility] hospital lab) Nasopharyngeal Nasopharyngeal Swab     Status: None   Collection Time: 01/24/20  8:04 PM   Specimen: Nasopharyngeal Swab  Result Value Ref Range Status   SARS Coronavirus 2 NEGATIVE NEGATIVE Final    Comment: (NOTE) SARS-CoV-2 target nucleic acids are NOT DETECTED.  The SARS-CoV-2 RNA is generally detectable in upper and lower respiratory specimens during the acute phase of infection. The lowest concentration of SARS-CoV-2 viral copies this assay can detect is 250 copies / mL. A negative result does not preclude SARS-CoV-2 infection and should not be used as the sole basis for treatment or other patient management decisions.  A negative result may occur with improper specimen collection /  handling, submission of specimen other than nasopharyngeal swab, presence of viral mutation(s) within the areas targeted by this assay, and inadequate number of viral copies (<250 copies / mL). A negative result must be combined with clinical observations, patient history, and epidemiological information.  Fact Sheet for Patients:   StrictlyIdeas.no  Fact Sheet for Healthcare Providers: BankingDealers.co.za  This test is not yet approved or  cleared by the [place] FDA and has been authorized for detection and/or diagnosis of SARS-CoV-2 by FDA under an Emergency Use Authorization (EUA).  This EUA will remain in effect (meaning this test can be used) for the duration of the COVID-19 declaration under Section 564(b)(1) of the Act, 21 U.S.C. section 360bbb-3(b)(1), unless the authorization is terminated or revoked sooner.  Performed at [facility], [address] [place], [place] [address]     Procedures and diagnostic studies:  No results found.             LOS: 3 days   [place] [facility]   Pager [phone]. If 7PM-7AM, please contact night-coverage at [email or web address]     01/28/2020, 10:35 AM

## 2020-01-29 ENCOUNTER — Inpatient Hospital Stay: Payer: Medicare Other | Admitting: Anesthesiology

## 2020-01-29 ENCOUNTER — Encounter: Payer: Self-pay | Admitting: Internal Medicine

## 2020-01-29 ENCOUNTER — Encounter
Admission: EM | Disposition: A | Discharge status: Skilled Nursing Facility | Payer: Self-pay | Source: Skilled Nursing Facility | Attending: Internal Medicine

## 2020-01-29 DIAGNOSIS — I723 Aneurysm of iliac artery: Secondary | ICD-10-CM

## 2020-01-29 DIAGNOSIS — I714 Abdominal aortic aneurysm, without rupture: Secondary | ICD-10-CM

## 2020-01-29 HISTORY — PX: ENDOVASCULAR REPAIR/STENT GRAFT: CATH118280

## 2020-01-29 LAB — BASIC METABOLIC PANEL
Anion gap: 9 (ref 5–15)
BUN: 10 mg/dL (ref 8–23)
CO2: 24 mmol/L (ref 22–32)
Calcium: 8.8 mg/dL — ABNORMAL LOW (ref 8.9–10.3)
Chloride: 103 mmol/L (ref 98–111)
Creatinine, Ser: 0.88 mg/dL (ref 0.61–1.24)
GFR calc Af Amer: >60 mL/min (ref >60)
GFR calc non Af Amer: >60 mL/min (ref >60)
Glucose, Bld: 132 mg/dL — ABNORMAL HIGH (ref 70–99)
Potassium: 3.9 mmol/L (ref 3.5–5.1)
Sodium: 136 mmol/L (ref 135–145)

## 2020-01-29 LAB — MAGNESIUM: Magnesium: 2 mg/dL (ref 1.7–2.4)

## 2020-01-29 LAB — CBC
HCT: 40.9 % (ref 39.0–52.0)
Hemoglobin: 13.8 g/dL (ref 13.0–17.0)
MCH: 32.8 pg (ref 26.0–34.0)
MCHC: 33.7 g/dL (ref 30.0–36.0)
MCV: 97.1 fL (ref 80.0–100.0)
Platelets: 204 10*3/uL (ref 150–400)
RBC: 4.21 MIL/uL — ABNORMAL LOW (ref 4.22–5.81)
RDW: 12.2 % (ref 11.5–15.5)
WBC: 8.1 10*3/uL (ref 4.0–10.5)
nRBC: 0 % (ref 0.0–0.2)

## 2020-01-29 LAB — PROTIME-INR
INR: 1.1 (ref 0.8–1.2)
Prothrombin Time: 13.8 seconds (ref 11.4–15.2)

## 2020-01-29 LAB — GLUCOSE, CAPILLARY
Glucose-Capillary: 110 mg/dL — ABNORMAL HIGH (ref 70–99)
Glucose-Capillary: 193 mg/dL — ABNORMAL HIGH (ref 70–99)
Glucose-Capillary: 204 mg/dL — ABNORMAL HIGH (ref 70–99)
Glucose-Capillary: 188 mg/dL — ABNORMAL HIGH (ref 70–99)

## 2020-01-29 LAB — APTT: aPTT: 30 seconds (ref 24–36)

## 2020-01-29 SURGERY — ENDOVASCULAR REPAIR/STENT GRAFT
Anesthesia: General

## 2020-01-29 SURGERY — ENDOVASCULAR STENT GRAFT (AAA)
Anesthesia: General

## 2020-01-29 MED ORDER — CEFAZOLIN SODIUM-DEXTROSE 2-4 GM/100ML-% IV SOLN
INTRAVENOUS | Status: AC
  Administered 2020-01-29: 2 g via INTRAVENOUS
  Filled 2020-01-29: qty 100

## 2020-01-29 MED ORDER — VASOPRESSIN 20 UNIT/ML IV SOLN
INTRAVENOUS | Status: DC | PRN
Start: 2020-01-29 — End: 2020-01-29
  Administered 2020-01-29: 2 [IU] via INTRAVENOUS
  Administered 2020-01-29: 4 [IU] via INTRAVENOUS
  Administered 2020-01-29: 2 [IU] via INTRAVENOUS

## 2020-01-29 MED ORDER — FENTANYL CITRATE (PF) 100 MCG/2ML IJ SOLN
INTRAMUSCULAR | Status: DC | PRN
  Administered 2020-01-29 (×3): 50 ug via INTRAVENOUS

## 2020-01-29 MED ORDER — CHLORHEXIDINE GLUCONATE CLOTH 2 % EX PADS
6.0000 | MEDICATED_PAD | Freq: Once | CUTANEOUS | Status: AC
  Administered 2020-01-29: 6 via TOPICAL

## 2020-01-29 MED ORDER — POTASSIUM CHLORIDE CRYS ER 20 MEQ PO TBCR
EXTENDED_RELEASE_TABLET | ORAL | Status: AC
  Filled 2020-01-29: qty 1

## 2020-01-29 MED ORDER — PHENYLEPHRINE HCL (PRESSORS) 10 MG/ML IV SOLN
INTRAVENOUS | Status: DC | PRN
  Administered 2020-01-29: 200 ug via INTRAVENOUS
  Administered 2020-01-29: 100 ug via INTRAVENOUS

## 2020-01-29 MED ORDER — HEPARIN SODIUM (PORCINE) 1000 UNIT/ML IJ SOLN
INTRAMUSCULAR | Status: DC | PRN
  Administered 2020-01-29: 6000 [IU] via INTRAVENOUS
  Administered 2020-01-29: 2000 [IU] via INTRAVENOUS

## 2020-01-29 MED ORDER — FENTANYL CITRATE (PF) 100 MCG/2ML IJ SOLN
INTRAMUSCULAR | Status: AC
  Filled 2020-01-29: qty 2

## 2020-01-29 MED ORDER — EPHEDRINE SULFATE 50 MG/ML IJ SOLN
INTRAMUSCULAR | Status: DC | PRN
  Administered 2020-01-29 (×2): 5 mg via INTRAVENOUS

## 2020-01-29 MED ORDER — ACETAMINOPHEN 325 MG PO TABS
325.0000 mg | ORAL_TABLET | ORAL | Status: DC | PRN
  Administered 2020-01-29: 650 mg via ORAL

## 2020-01-29 MED ORDER — CEFAZOLIN SODIUM-DEXTROSE 2-4 GM/100ML-% IV SOLN
INTRAVENOUS | Status: AC
  Filled 2020-01-29: qty 100

## 2020-01-29 MED ORDER — CIPROFLOXACIN HCL 500 MG PO TABS
ORAL_TABLET | ORAL | Status: AC
  Administered 2020-01-30: 500 mg via ORAL
  Filled 2020-01-29: qty 1

## 2020-01-29 MED ORDER — FENTANYL CITRATE (PF) 100 MCG/2ML IJ SOLN
INTRAMUSCULAR | Status: AC
  Administered 2020-01-29: 25 ug via INTRAVENOUS
  Filled 2020-01-29: qty 2

## 2020-01-29 MED ORDER — HYDRALAZINE HCL 20 MG/ML IJ SOLN: 5.0000 mg | INTRAMUSCULAR | Status: DC | PRN

## 2020-01-29 MED ORDER — SUGAMMADEX SODIUM 500 MG/5ML IV SOLN
INTRAVENOUS | Status: DC | PRN
Start: 2020-01-29 — End: 2020-01-29
  Administered 2020-01-29: 500 mg via INTRAVENOUS

## 2020-01-29 MED ORDER — OXYCODONE-ACETAMINOPHEN 5-325 MG PO TABS: 1.0000 | ORAL_TABLET | ORAL | Status: DC | PRN

## 2020-01-29 MED ORDER — SODIUM CHLORIDE 0.9 % IV SOLN: INTRAVENOUS | Status: DC

## 2020-01-29 MED ORDER — DEXAMETHASONE SODIUM PHOSPHATE 10 MG/ML IJ SOLN
INTRAMUSCULAR | Status: DC | PRN
  Administered 2020-01-29: 10 mg via INTRAVENOUS

## 2020-01-29 MED ORDER — ACETAMINOPHEN 325 MG RE SUPP
325.0000 mg | RECTAL | Status: DC | PRN
  Filled 2020-01-29: qty 2

## 2020-01-29 MED ORDER — METRONIDAZOLE 500 MG PO TABS
500.0000 mg | ORAL_TABLET | Freq: Three times a day (TID) | ORAL | Status: DC
  Administered 2020-01-30 (×2): 500 mg via ORAL
  Filled 2020-01-29 (×4): qty 1

## 2020-01-29 MED ORDER — SODIUM CHLORIDE 0.9 % IV SOLN
INTRAVENOUS | Status: DC | PRN
  Administered 2020-01-29: 30 ug/min via INTRAVENOUS

## 2020-01-29 MED ORDER — ROCURONIUM BROMIDE 100 MG/10ML IV SOLN
INTRAVENOUS | Status: DC | PRN
  Administered 2020-01-29: 10 mg via INTRAVENOUS
  Administered 2020-01-29: 20 mg via INTRAVENOUS
  Administered 2020-01-29: 50 mg via INTRAVENOUS

## 2020-01-29 MED ORDER — DIVALPROEX SODIUM 125 MG PO DR TAB
125.0000 mg | DELAYED_RELEASE_TABLET | Freq: Every day | ORAL | Status: DC
  Filled 2020-01-29: qty 1

## 2020-01-29 MED ORDER — NITROGLYCERIN IN D5W 200-5 MCG/ML-% IV SOLN: 5.0000 ug/min | INTRAVENOUS | Status: DC

## 2020-01-29 MED ORDER — SODIUM CHLORIDE 0.9 % IV SOLN: 500.0000 mL | Freq: Once | INTRAVENOUS | Status: DC | PRN

## 2020-01-29 MED ORDER — PROPOFOL 10 MG/ML IV BOLUS
INTRAVENOUS | Status: DC | PRN
  Administered 2020-01-29: 120 mg via INTRAVENOUS

## 2020-01-29 MED ORDER — SUCCINYLCHOLINE CHLORIDE 20 MG/ML IJ SOLN
INTRAMUSCULAR | Status: DC | PRN
  Administered 2020-01-29: 100 mg via INTRAVENOUS

## 2020-01-29 MED ORDER — ROCURONIUM BROMIDE 100 MG/10ML IV SOLN: INTRAVENOUS | Status: DC | PRN

## 2020-01-29 MED ORDER — PHENOL 1.4 % MT LIQD
1.0000 | OROMUCOSAL | Status: DC | PRN
  Filled 2020-01-29: qty 177

## 2020-01-29 MED ORDER — MIDAZOLAM HCL 2 MG/2ML IJ SOLN
INTRAMUSCULAR | Status: AC
  Filled 2020-01-29: qty 2

## 2020-01-29 MED ORDER — SODIUM CHLORIDE 0.9 % IV SOLN
INTRAVENOUS | Status: DC | PRN
Start: 2020-01-29 — End: 2020-01-29

## 2020-01-29 MED ORDER — MAGNESIUM SULFATE 2 GM/50ML IV SOLN
2.0000 g | Freq: Every day | INTRAVENOUS | Status: DC | PRN
  Filled 2020-01-29: qty 50

## 2020-01-29 MED ORDER — ESMOLOL HCL 100 MG/10ML IV SOLN
INTRAVENOUS | Status: DC | PRN
  Administered 2020-01-29: 20 mg via INTRAVENOUS

## 2020-01-29 MED ORDER — MORPHINE SULFATE (PF) 4 MG/ML IV SOLN
2.0000 mg | INTRAVENOUS | Status: DC | PRN
  Administered 2020-01-29: 4 mg via INTRAVENOUS

## 2020-01-29 MED ORDER — POTASSIUM CHLORIDE CRYS ER 20 MEQ PO TBCR: 20.0000 meq | EXTENDED_RELEASE_TABLET | Freq: Every day | ORAL | Status: DC | PRN

## 2020-01-29 MED ORDER — GUAIFENESIN-DM 100-10 MG/5ML PO SYRP
15.0000 mL | ORAL_SOLUTION | ORAL | Status: DC | PRN
  Filled 2020-01-29: qty 15

## 2020-01-29 MED ORDER — METOPROLOL TARTRATE 5 MG/5ML IV SOLN: 2.0000 mg | INTRAVENOUS | Status: DC | PRN

## 2020-01-29 MED ORDER — METOPROLOL TARTRATE 25 MG PO TABS
ORAL_TABLET | ORAL | Status: AC
  Filled 2020-01-29: qty 1

## 2020-01-29 MED ORDER — LIDOCAINE HCL (CARDIAC) PF 100 MG/5ML IV SOSY
PREFILLED_SYRINGE | INTRAVENOUS | Status: DC | PRN
  Administered 2020-01-29: 100 mg via INTRAVENOUS

## 2020-01-29 MED ORDER — GLYCOPYRROLATE 0.2 MG/ML IJ SOLN
INTRAMUSCULAR | Status: DC | PRN
  Administered 2020-01-29: .2 mg via INTRAVENOUS

## 2020-01-29 MED ORDER — ONDANSETRON HCL 4 MG/2ML IJ SOLN
4.0000 mg | Freq: Four times a day (QID) | INTRAMUSCULAR | Status: DC | PRN
  Administered 2020-01-29: 4 mg via INTRAVENOUS

## 2020-01-29 MED ORDER — IODIXANOL 320 MG/ML IV SOLN
INTRAVENOUS | Status: DC | PRN
  Administered 2020-01-29: 80 mg

## 2020-01-29 MED ORDER — ALUM & MAG HYDROXIDE-SIMETH 200-200-20 MG/5ML PO SUSP: 15.0000 mL | ORAL | Status: DC | PRN

## 2020-01-29 MED ORDER — CEFAZOLIN SODIUM-DEXTROSE 2-4 GM/100ML-% IV SOLN
2.0000 g | INTRAVENOUS | Status: AC
  Administered 2020-01-29: 2 g via INTRAVENOUS
  Filled 2020-01-29: qty 100

## 2020-01-29 MED ORDER — CEFAZOLIN SODIUM-DEXTROSE 2-4 GM/100ML-% IV SOLN: 2.0000 g | Freq: Three times a day (TID) | INTRAVENOUS | Status: AC

## 2020-01-29 MED ORDER — LABETALOL HCL 5 MG/ML IV SOLN: 10.0000 mg | INTRAVENOUS | Status: DC | PRN

## 2020-01-29 MED ORDER — OXYCODONE HCL 5 MG PO TABS: 5.0000 mg | ORAL_TABLET | Freq: Once | ORAL | Status: DC | PRN

## 2020-01-29 MED ORDER — FENTANYL CITRATE (PF) 100 MCG/2ML IJ SOLN
25.0000 ug | INTRAMUSCULAR | Status: DC | PRN
  Administered 2020-01-29 (×3): 25 ug via INTRAVENOUS

## 2020-01-29 MED ORDER — HEPARIN SODIUM (PORCINE) 1000 UNIT/ML IJ SOLN
INTRAMUSCULAR | Status: AC
  Filled 2020-01-29: qty 3

## 2020-01-29 MED ORDER — DOPAMINE-DEXTROSE 3.2-5 MG/ML-% IV SOLN: 3.0000 ug/kg/min | INTRAVENOUS | Status: DC

## 2020-01-29 MED ORDER — DOCUSATE SODIUM 100 MG PO CAPS
100.0000 mg | ORAL_CAPSULE | Freq: Every day | ORAL | Status: DC
  Administered 2020-01-30: 100 mg via ORAL
  Filled 2020-01-29 (×2): qty 1

## 2020-01-29 MED ORDER — OXYCODONE HCL 5 MG/5ML PO SOLN
5.0000 mg | Freq: Once | ORAL | Status: DC | PRN
  Filled 2020-01-29: qty 5

## 2020-01-29 SURGICAL SUPPLY — 69 items
BALLN ARMADA 14X40X80 (BALLOONS) ×6
BALLN MUSTANG 6X20X135 (BALLOONS) ×3
BALLN MUSTANG 8X20X75 (BALLOONS) ×3
BALLN ULTRVRSE 5X40X130C (BALLOONS) ×3
BALLOON ARMADA 14X40X80 (BALLOONS) IMPLANT
BALLOON MUSTANG 6X20X135 (BALLOONS) IMPLANT
BALLOON MUSTANG 8X20X75 (BALLOONS) IMPLANT
BALLOON ULTRVRSE 5X40X130C (BALLOONS) IMPLANT
BLADE SURG 15 STRL LF DISP TIS (BLADE) IMPLANT
BLADE SURG 15 STRL SS (BLADE) ×2
BLADE SURG SZ11 CARB STEEL (BLADE) ×2 IMPLANT
BOOT SUTURE AID YELLOW STND (SUTURE) ×2 IMPLANT
CATH ACCU-VU SIZ PIG 5F 70CM (CATHETERS) ×2 IMPLANT
CATH BALLN CODA 9X100X32 (BALLOONS) ×2 IMPLANT
CATH BEACON 5 .035 65 KMP TIP (CATHETERS) ×2 IMPLANT
DERMABOND ADVANCED (GAUZE/BANDAGES/DRESSINGS) ×2
DERMABOND ADVANCED .7 DNX12 (GAUZE/BANDAGES/DRESSINGS) IMPLANT
DEVICE CLOSURE PERCLS PRGLD 6F (VASCULAR PRODUCTS) IMPLANT
DEVICE INFLAT 30 PLUS (MISCELLANEOUS) ×4 IMPLANT
DEVICE TORQUE .025-.038 (MISCELLANEOUS) ×2 IMPLANT
DRYSEAL FLEXSHEATH 12FR 45CM (SHEATH) ×2
DRYSEAL FLEXSHEATH 16FR 33CM (SHEATH) ×2
DRYSEAL FLEXSHEATH 18FR 33CM (SHEATH) ×2
ELECT CAUTERY BLADE 6.4 (BLADE) ×2 IMPLANT
ELECT REM PT RETURN 9FT ADLT (ELECTROSURGICAL) ×3
ELECTRODE REM PT RTRN 9FT ADLT (ELECTROSURGICAL) IMPLANT
ENSNARE 18-30 (MISCELLANEOUS) ×4 IMPLANT
EXCLUDER 23MMX10CM (Endovascular Graft) ×2 IMPLANT
EXCLUDER TNK 28X14.5MMX12CM (Endovascular Graft) IMPLANT
EXCLUDER TRUNK 28X14.5MMX12CM (Endovascular Graft) ×3 IMPLANT
GLIDEWIRE ANGLED SS 035X260CM (WIRE) ×2 IMPLANT
GLIDEWIRE STIFF .35X180X3 HYDR (WIRE) ×2 IMPLANT
GLOVE BIO SURGEON STRL SZ7 (GLOVE) ×2 IMPLANT
GLOVE SURG SYN 8.0 (GLOVE) ×3 IMPLANT
GLOVE SURG SYN 8.0 PF PI (GLOVE) IMPLANT
GOWN STRL REUS W/ TWL LRG LVL3 (GOWN DISPOSABLE) IMPLANT
GOWN STRL REUS W/TWL LRG LVL3 (GOWN DISPOSABLE) ×2
GRAFT EXCLUDER ILIAC (Endovascular Graft) ×2 IMPLANT
IV NS 1000ML (IV SOLUTION) ×2
IV NS 1000ML BAXH (IV SOLUTION) IMPLANT
LEG CONTRALATERAL 18X11.5 (Endovascular Graft) ×2 IMPLANT
LEG CONTRALATERAL 27X12 (Vascular Products) ×2 IMPLANT
LOOP RED MAXI  1X406MM (MISCELLANEOUS) ×2
LOOP VESSEL MAXI 1X406 RED (MISCELLANEOUS) IMPLANT
LOOP VESSEL MINI 0.8X406 BLUE (MISCELLANEOUS) IMPLANT
LOOPS BLUE MINI 0.8X406MM (MISCELLANEOUS) ×2
NDL ENTRY 21GA 7CM ECHOTIP (NEEDLE) IMPLANT
NEEDLE ENTRY 21GA 7CM ECHOTIP (NEEDLE) ×3 IMPLANT
PACK ANGIOGRAPHY (CUSTOM PROCEDURE TRAY) ×3 IMPLANT
PACK BASIN MAJOR ARMC (MISCELLANEOUS) ×2 IMPLANT
PERCLOSE PROGLIDE 6F (VASCULAR PRODUCTS) ×18
SET INTRO CAPELLA COAXIAL (SET/KITS/TRAYS/PACK) ×2 IMPLANT
SHEATH BRITE TIP 6FRX11 (SHEATH) ×4 IMPLANT
SHEATH BRITE TIP 8FRX11 (SHEATH) ×4 IMPLANT
SHEATH DRYSEAL FLEX 12FR 45CM (SHEATH) IMPLANT
SHEATH DRYSEAL FLEX 16FR 33CM (SHEATH) IMPLANT
SHEATH DRYSEAL FLEX 18FR 33CM (SHEATH) IMPLANT
SPONGE XRAY 4X4 16PLY STRL (MISCELLANEOUS) ×6 IMPLANT
SUT MNCRL+ 5-0 UNDYED PC-3 (SUTURE) IMPLANT
SUT MONOCRYL 5-0 (SUTURE) ×2
SYR 10ML LL (SYRINGE) ×2 IMPLANT
SYR 20ML LL LF (SYRINGE) ×2 IMPLANT
SYR 3ML LL SCALE MARK (SYRINGE) ×2 IMPLANT
TOWEL OR 17X26 4PK STRL BLUE (TOWEL DISPOSABLE) ×2 IMPLANT
TUBING CONTRAST HIGH PRESS 72 (TUBING) ×2 IMPLANT
WIRE AMPLATZ SSTIFF .035X260CM (WIRE) ×4 IMPLANT
WIRE G VAS 035X260 STIFF (WIRE) ×2 IMPLANT
WIRE J 3MM .035X145CM (WIRE) ×4 IMPLANT
WIRE MAGIC TORQUE 260C (WIRE) ×2 IMPLANT

## 2020-01-29 NOTE — Transfer of Care (Signed)
Immediate Anesthesia Transfer of Care Note  Patient: Chad Combs  Procedure(s) Performed: ENDOVASCULAR REPAIR/STENT GRAFT (N/A ) ENDOVASCULAR REPAIR/STENT GRAFT (N/A )  Patient Location: PACU  Anesthesia Type:General  Level of Consciousness: awake, drowsy and patient cooperative  Airway & Oxygen Therapy: Patient Spontanous Breathing and Patient connected to face mask oxygen  Post-op Assessment: Report given to RN and Post -op Vital signs reviewed and stable  Post vital signs: Reviewed and stable  Last Vitals:  Vitals Value Taken Time  BP 111/68 01/29/20 1045  Temp    Pulse 87 01/29/20 1053  Resp 14 01/29/20 1053  SpO2 99 % 01/29/20 1053  Vitals shown include unvalidated device data.  Last Pain:  Vitals:   01/29/20 0720  TempSrc: Oral  PainSc: 0-No pain      Patients Stated Pain Goal: 0 (17/79/39 0300)  Complications: No complications documented.

## 2020-01-29 NOTE — Progress Notes (Signed)
Per PACU RN, pt transferring to ICU post procedure. 2C staff will gather patient belongings and take to PACU.   Fuller Mandril, RN

## 2020-01-29 NOTE — Op Note (Signed)
OPERATIVE NOTE     PROCEDURE: 1. US guidance for vascular access, bilateral femoral arteries 2. Catheter placement into aorta from bilateral femoral approaches 3. Catheter placement into tertiary branches of the right internal iliac artery from the left femoral approach 4. Placement of a C3 Gore Excluder Endoprosthesis main body 28 x 14 x 12 with a 27 x 12 contralateral limb 5. Placement of a C3 Gore Excluder bifurcated iliac segment 23 x 10 x 10 which is deployed into the right external iliac artery 6. Placement of a 16 x 10 x 7 stent into the right hypogastric artery 7. Placement of a 18 x 12 extender limb left external iliac artery 8. Pro glide closures utilized in a pre-close fashion bilateral common femoral arteries   PRE-OPERATIVE DIAGNOSIS: AAA   POST-OPERATIVE DIAGNOSIS: same   SURGEON: [name], MD and Leotis Pain, MD - Co-surgeons   ANESTHESIA: general   ESTIMATED BLOOD LOSS: 150 cc   FINDING(S): 1.  AAA with bilateral common iliac artery aneurysm   SPECIMEN(S):  none   INDICATIONS:   Chad Combs is a 64 y.o. y.o. male who presents with an abdominal aortic aneurysm that is now greater than 5 cm and is associated with bilateral common iliac artery aneurysms. The patient will require endovascular repair to prevent lethal rupture.   DESCRIPTION: After obtaining full informed written consent, the patient was brought back to the operating room and placed supine upon the operating table.  The patient received IV antibiotics prior to induction.  After obtaining adequate anesthesia, the patient was prepped and draped in the standard fashion for endovascular AAA repair.  Co-surgeons are required because this is a complex bilateral procedure with work being performed simultaneously from both the right femoral and left femoral approach.  This also expedites the procedure making a shorter operative time reducing complications and improving patient safety.  We then began by  gaining access to both femoral arteries with US guidance with me working on the patient's left and Dr. Lucky Cowboy working on the patient's right.  The femoral arteries were found to be patent and accessed without difficulty with a needle under ultrasound guidance without difficulty on each side and permanent images were recorded.  We then placed 2 proglide devices on each side in a pre-close fashion and placed 8 French sheaths.   The patient was then given  6000 units of intravenous heparin later in the case an additional 2000 units of heparin was given.    The Pigtail catheter was placed into the aorta from the   right side. Image was then obtained demonstrating the right iliac bifurcation. Stiff Amplatz wire was then advanced up both the right and left sides. 48 French sheath was then advanced up the right side and the 8 [place] sheath was exchanged for a 12 French sheath up the left side. Subsequently a Glidewire was advanced up the right side and snared by Dr. Delana Macknight from the left side and pulled extracorporeally. Using this wire which now is traveling from the right groin over the aortic bifurcation to the left groin the 12 French sheath is advanced and positioned just proximal to the right iliac bifurcation.  Because there was a greater than 90% stenosis at the ostia of the right hypogastric we elected to predilate this lesion.  Using a stiff angled Glidewire and a Kumpe catheter the catheter was advanced across the lesion and into the tertiary branches of the hypogastric on the right.  Initially a 4 x 40  mm Ultraverse balloon was used to angioplasty the lesion follow-up imaging demonstrated there was only modest improvement and a 5 mm x 20 mm Ultraverse balloon was used to angioplasty the ostium of the right hypogastric.  Inflation was to 12 atm for 30 seconds.  Follow-up imaging now demonstrated marked improvement which will allow for easier cannulation.  The 23 x 10 x 10 iliac bifurcation device was then  prepped on the back table and advanced through the 37 French sheath and positioned above the right iliac bifurcation. The proximal portion was then opened and the left groin sheath was advanced into the main portion of the iliac bifurcation device. Dr. Delana Hidrogo then used a KMP catheter and a Glidewire the wire catheter combination was negotiate the left internal iliac artery. Catheter was then introduced down into the tertiary branches hand injection contrast was used to localize the first major bifurcation as well as the distal anatomy.  This represents 3rd order catheter placement. Subsequently, an Amplatz Super Stiff wire was advanced through the catheter and the catheter removed. Next, the 16 x 10 x 7 extender limb was deployed bridging the iliac bifurcation device with the right internal iliac device. Angioplasty balloon was then advanced up both the right and left side and simultaneously inflated using a 14 mm balloon in the right external iliac and a 14 mm balloon in the right internal iliac. Once this had been achieved the crossing Glidewire  was removed the left sided sheath was pulled back into the left common iliac and an stiff angle Glidewire was advanced under fluoroscopic guidance into the descending thoracic aorta. The 12 French sheath on the left was then exchanged for a 16 [place] sheath.    Pigtail catheter was then reintroduced and angiography of the abdominal aorta was obtained localized in the renal arteries. Using this image, we selected a 28 x 14 x 12 Main body device. The main body was then advanced over a stiff wire. A Pigtail catheter was placed up the right side and a magnified image at the renal arteries was performed. The main body was then deployed just below the lowest renal artery. The Kumpe catheter was used to cannulate the contralateral gate without difficulty and successful cannulation was confirmed by twirling the pigtail catheter in the main body. We then placed a stiff wire and  a retrograde arteriogram was performed through the right femoral sheath.   A 27 x 12 limb was selected and advance through the contralateral gate of the main body.  The limb was deployed bridging the main body to the bifurcated iliac device. The main body deployment was then completed. Based off the angiographic findings, extension limbs were necessary.  A 18 x 12 extender limb was then advanced up the left side and deployed. All remaining junction points and seals zones were treated with the compliant balloon.    The pigtail catheter was then replaced and a completion angiogram was performed.   No Endoleak was detected on completion angiography. The renal arteries were found to be widely patent.    At this point we elected to terminate the procedure. We secured the pro glide devices for hemostasis on the femoral arteries. The skin incision was closed with a 4-0 Monocryl. Dermabond and pressure dressing were placed. The patient was taken to the recovery room in stable condition having tolerated the procedure well.   COMPLICATIONS: none   CONDITION: stable   [name]  Vein and Vascular Office: [phone]   01/29/2020, 10:31 AM

## 2020-01-29 NOTE — Interval H&P Note (Signed)
History and Physical Interval Note:  01/29/2020 7:11 AM  Chad Combs  has presented today for surgery, with the diagnosis of Abdominal aortic aneurysm.  The various methods of treatment have been discussed with the patient and family. After consideration of risks, benefits and other options for treatment, the patient has consented to  Procedure(s): ENDOVASCULAR REPAIR/STENT GRAFT (N/A) as a surgical intervention.  The patient's history has been reviewed, patient examined, no change in status, stable for surgery.  I have reviewed the patient's chart and labs.  Questions were answered to the patient's satisfaction.     [name]

## 2020-01-29 NOTE — Progress Notes (Addendum)
Progress Note    Chad Combs  IRC:[id number] DOB: 1955-09-06  DOA: 01/24/2020 PCP: Alvester Morin, MD      Brief Narrative:    Medical records reviewed and are as summarized below:  Chad Combs is a 64 y.o. male with medical history significant for DM, HTN, COPD, AAA, CVA with left hemiplegia, who presents to the emergency room with a complaint of loose bowel movements with blood noted today by nursing home staff.  Patient reports a 1 month history of diffuse abdominal pain of mild to moderate intensity when eating without associated nausea, vomiting, fever or chills.   Work-up revealed acute colitis.  She was treated with empiric IV antibiotics, analgesics and bowel rest.  Acute colitis has improved.  Work-up also showed 7.3 cm AAA and bilateral common iliac artery aneurysm.  He was seen in consultation by the vascular surgeon and he underwent endovascular repair with stent placement on 01/29/2020.     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 Continue oral Protonix s/p endovascular repair with stent placement on 01/29/2020.  Patient is awaiting transfer to ICU for monitoring pending bed  availability. He is on IV fluids. Continue aspirin and Plavix. Continue antihypertensives NovoLog as needed for hyperglycemia Continue Lipitor Continue bronchodilators Continue antiepileptics    Body mass index is 26.63 kg/m.  Diet Order            Diet clear liquid Room service appropriate? Yes; Fluid consistency: Thin  Diet effective now                       Medications:   . amLODipine  5 mg Oral Daily  . aspirin EC  81 mg Oral Daily  . atorvastatin  20 mg Oral q1800  . ciprofloxacin  500 mg Oral BID  . clopidogrel  75 mg Oral Daily  . divalproex  125 mg Oral QHS  . [START ON 01/30/2020] docusate sodium  100 mg Oral Daily  . enoxaparin (LOVENOX) injection  40 mg Subcutaneous Q24H  . gabapentin  300 mg Oral Daily  . insulin aspart  0-5 Units Subcutaneous QHS  . insulin aspart  0-9 Units Subcutaneous TID WC  . levETIRAcetam  250 mg Oral BID  . lisinopril  40 mg Oral Daily  . metoprolol tartrate  12.5 mg Oral BID  . metroNIDAZOLE  500 mg Oral Q8H  . mometasone-formoterol  2 puff Inhalation BID  . pantoprazole (PROTONIX) IV  40 mg Intravenous q1800  . potassium chloride SA  20 mEq Oral BID  . sertraline  100 mg Oral Daily  . sodium chloride flush  10-40 mL Intracatheter Q12H  . tamsulosin  0.4 mg Oral QPC supper   Continuous Infusions: .  sodium chloride 75 mL/hr at 01/29/20 1635  . sodium chloride    .  ceFAZolin (ANCEF) IV    . DOPamine    . magnesium sulfate bolus IVPB    . nitroGLYCERIN       Anti-infectives (From admission, onward)   Start     Dose/Rate Route Frequency Ordered Stop   01/29/20 1600  ceFAZolin (ANCEF) IVPB 2g/100 mL premix     Discontinue     2 g 200 mL/hr over 30 Minutes Intravenous Every 8 hours 01/29/20 1355 01/30/20 0759   01/29/20 0703  ceFAZolin (ANCEF) 2-4 GM/100ML-% IVPB       Note to Pharmacy: Rozanna Box   : cabinet override      01/29/20 0703 01/29/20 0848   01/29/20 0700  ceFAZolin (ANCEF) IVPB 2g/100 mL premix         2 g 200 mL/hr over 30 Minutes Intravenous On call to O.R. 01/29/20 2951 01/29/20 0826   01/28/20 1000  ciprofloxacin (CIPRO) tablet 500 mg     Discontinue     500 mg Oral 2 times daily 01/28/20 0720     01/28/20 0800  metroNIDAZOLE (FLAGYL) tablet 500 mg     Discontinue     500 mg Oral Every 8 hours 01/28/20 0720     01/25/20 1000  ciprofloxacin (CIPRO) IVPB 400 mg  Status:  Discontinued        400 mg 200 mL/hr over 60 Minutes Intravenous Every 12 hours 01/24/20 2326 01/28/20 0720   01/25/20 0800  metroNIDAZOLE (FLAGYL) IVPB 500 mg  Status:  Discontinued        500 mg 100 mL/hr over 60 Minutes Intravenous Every 8 hours 01/24/20 2326 01/28/20 0720   01/24/20 2300  ciprofloxacin (CIPRO) IVPB 400 mg        400 mg 200 mL/hr over 60 Minutes Intravenous  Once 01/24/20 2250 01/25/20 0200   01/24/20 2300  metroNIDAZOLE (FLAGYL) IVPB 500 mg        500 mg 100 mL/hr over 60 Minutes Intravenous  Once 01/24/20 2250 01/25/20 0421             Family Communication/Anticipated D/C date and plan/Code Status   DVT prophylaxis: SCD's Start: 01/29/20 1424 enoxaparin (LOVENOX) injection 40 mg Start: 01/25/20 1000     Code Status: Full Code  Family Communication: Plan discussed with patient Disposition Plan:    Status is: Inpatient  Remains inpatient appropriate because:IV treatments appropriate due to intensity of illness or inability to take PO and Inpatient level of care appropriate due to severity of illness   Dispo: The patient is from: SNF              Anticipated d/c is to: SNF              Anticipated d/c date is: 2 days              Patient currently is not medically stable to d/c.                 Subjective:   Patient was seen at PACU.  He complains of pain in bilateral groin.  No shortness of breath or chest pain.   Objective:    Vitals:   01/29/20 1600 01/29/20 1615 01/29/20 1645 01/29/20 1700  BP: (!) 151/96 (!) 155/94 (!) 132/93 (!) 149/98   Pulse: 92 96 98 (!) 112  Resp: 14 12 17 13   Temp:      TempSrc:  SpO2: 95% 95% 96% 96%  Weight:      Height:       No data found.   Intake/Output Summary (Last 24 hours) at 01/29/2020 1748 Last data filed at 01/29/2020 1500 Gross per 24 hour  Intake 1599.32 ml  Output 2900 ml  Net -1300.68 ml   Filed Weights   01/24/20 1921 01/25/20 2026  Weight: 80.3 kg 81.8 kg    Exam:  GEN: No acute distress SKIN: Warm and dry EYES: EOMI, blind in right eye ENT: MMM CV: RRR PULM: Clear to auscultation bilaterally. ABD: soft, ND, nontender, +BS CNS: AAO x 3, left-sided hemiplegia EXT: No edema or tenderness.  No bleeding or hematoma noted on bilateral femoral access sites.      Data Reviewed:   I have personally reviewed following labs and imaging studies:  Labs: Labs show the following:   Basic Metabolic Panel: Recent Labs  Lab 01/24/20 2043 01/24/20 2043 01/27/20 0606 01/29/20 0528  NA 141  --  137 136  K 3.9   < > 3.8 3.9  CL 107  --  104 103  CO2 24  --  26 24  GLUCOSE 164*  --  127* 132*  BUN 23  --  9 10  CREATININE 1.07  --  0.93 0.88  CALCIUM 9.0  --  8.6* 8.8*  MG  --   --   --  2.0   < > = values in this interval not displayed.   GFR Estimated Creatinine Clearance: 84.8 mL/min (by C-G formula based on SCr of 0.88 mg/dL). Liver Function Tests: Recent Labs  Lab 01/24/20 2043  AST 29  ALT 26  ALKPHOS 65  BILITOT 1.1  PROT 7.6  ALBUMIN 3.7   Recent Labs  Lab 01/24/20 2043  LIPASE 29   No results for input(s): AMMONIA in the last 168 hours. Coagulation profile Recent Labs  Lab 01/24/20 2043 01/29/20 0528  INR 1.0 1.1    CBC: Recent Labs  Lab 01/24/20 2043 01/25/20 0733 01/27/20 0606 01/29/20 0528  WBC 16.9* 10.5 7.7 8.1  NEUTROABS  --   --  5.3  --   HGB 15.7 13.2 14.0 13.8  HCT 45.6 38.5* 40.3 40.9  MCV 96.0 97.0 94.6 97.1  PLT 197 166 177 204   Cardiac Enzymes: No results for input(s): CKTOTAL, CKMB, CKMBINDEX,  TROPONINI in the last 168 hours. BNP (last 3 results) No results for input(s): PROBNP in the last 8760 hours. CBG: Recent Labs  Lab 01/28/20 1644 01/28/20 2055 01/29/20 0724 01/29/20 1057 01/29/20 1730  GLUCAP 122* 129* 110* 193* 204*   D-Dimer: No results for input(s): DDIMER in the last 72 hours. Hgb A1c: No results for input(s): HGBA1C in the last 72 hours. Lipid Profile: No results for input(s): CHOL, HDL, LDLCALC, TRIG, CHOLHDL, LDLDIRECT in the last 72 hours. Thyroid function studies: No results for input(s): TSH, T4TOTAL, T3FREE, THYROIDAB in the last 72 hours.  Invalid input(s): FREET3 Anemia work up: No results for input(s): VITAMINB12, FOLATE, FERRITIN, TIBC, IRON, RETICCTPCT in the last 72 hours. Sepsis Labs: Recent Labs  Lab 01/24/20 2043 01/24/20 2247 01/25/20 0733 01/27/20 0606 01/29/20 0528  WBC 16.9*  --  10.5 7.7 8.1  LATICACIDVEN  --  1.6  --   --   --     Microbiology Recent Results (from the past 240 hour(s))  SARS Coronavirus 2 by RT PCR (hospital order, performed in [facility] hospital lab) Nasopharyngeal Nasopharyngeal Swab  Status: None   Collection Time: 01/24/20  8:04 PM   Specimen: Nasopharyngeal Swab  Result Value Ref Range Status   SARS Coronavirus 2 NEGATIVE NEGATIVE Final    Comment: (NOTE) SARS-CoV-2 target nucleic acids are NOT DETECTED.  The SARS-CoV-2 RNA is generally detectable in upper and lower respiratory specimens during the acute phase of infection. The lowest concentration of SARS-CoV-2 viral copies this assay can detect is 250 copies / mL. A negative result does not preclude SARS-CoV-2 infection and should not be used as the sole basis for treatment or other patient management decisions.  A negative result may occur with improper specimen collection / handling, submission of specimen other than nasopharyngeal swab, presence of viral mutation(s) within the areas targeted by this assay, and inadequate number of viral  copies (<250 copies / mL). A negative result must be combined with clinical observations, patient history, and epidemiological information.  Fact Sheet for Patients:   StrictlyIdeas.no  Fact Sheet for Healthcare Providers: BankingDealers.co.za  This test is not yet approved or  cleared by the [place] FDA and has been authorized for detection and/or diagnosis of SARS-CoV-2 by FDA under an Emergency Use Authorization (EUA).  This EUA will remain in effect (meaning this test can be used) for the duration of the COVID-19 declaration under Section 564(b)(1) of the Act, 21 U.S.C. section 360bbb-3(b)(1), unless the authorization is terminated or revoked sooner.  Performed at [facility], [address] [place], [place] [address]     Procedures and diagnostic studies:  PERIPHERAL VASCULAR CATHETERIZATION  Result Date: 01/29/2020 See Op Note              LOS: 4 days   [facility]   Pager [phone]. If 7PM-7AM, please contact night-coverage at [email or web address]     01/29/2020, 5:48 PM

## 2020-01-29 NOTE — Anesthesia Preprocedure Evaluation (Addendum)
Anesthesia Evaluation  Patient identified by MRN, date of birth, ID band Patient awake    Reviewed: Allergy & Precautions, H&P , NPO status , Patient's Chart, lab work & pertinent test results  History of Anesthesia Complications Negative for: history of anesthetic complications  Airway Mallampati: III  TM Distance: <3 FB Neck ROM: limited    Dental  (+) Chipped, Poor Dentition, Missing   Pulmonary shortness of breath and with exertion, COPD, Current Smoker and Patient abstained from smoking.,    Pulmonary exam normal        Cardiovascular hypertension, (-) angina(-) Past MI Normal cardiovascular exam     Neuro/Psych PSYCHIATRIC DISORDERS CVA (left sided weakness), Residual Symptoms    GI/Hepatic negative GI ROS, Neg liver ROS, neg GERD  ,  Endo/Other  diabetes, Type 2, Insulin Dependent  Renal/GU      Musculoskeletal   Abdominal   Peds  Hematology negative hematology ROS (+)   Anesthesia Other Findings Past Medical History: No date: AAA (abdominal aortic aneurysm) ([place])     Comment:  a. 08/2016 CTA: 3.1cm saccular aneurysm of distal Ao arch              w/ penetrating atheromatous ulcer. 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 09/2017 CTA               chest/abd/pelvis [facility]): 6.3cm infrarenal AAA, unchanged               bilateral CIA aneurysms (R 3.5cm, L 2.8cm)-->pt refused               intervention. 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 12/2016 CTA Neck: RICA 100%. LICA >30%; b. 06/6008 s/p               trans aortic LICA revascularization and stent placement               [facility]); c. 03/2017 Carotid U/S [facility]): No significant LICA               stenosis, patent stent. No date: COPD (chronic obstructive pulmonary disease) (HCC) No date: Diabetes mellitus without  complication (HCC) No date: Diastolic dysfunction     Comment:  a. 08/2016 Echo: EF 60%, Gr1 DD, mild MR, mildly dil               LA/RV. No cardiac source of emboli. No date: Femoral DVT (deep venous thrombosis) (HCC)     Comment:  a. 08/2016 L Fem Vein DVT ->xarelto/IVC filter. Xarelto               subsequently d/c'd. No date: History of pulmonary embolism     Comment:  a. 08/2016 following CVA-->chronic xarelto, s/p IVC               filter. Xarelto subsequently d/c'd. No date: History of right MCA stroke     Comment:  a. 08/2016 MRI: R MCA infarct w/ occlusion of RICA & R               MCA.  No date: Hypertension No date: Tobacco abuse  Past Surgical History: 08/24/2016: IVC FILTER INSERTION; N/A     Comment:  Procedure:  IVC Filter Insertion;  Surgeon: Katha Cabal, MD;  Location: [place] [facility];  Service:               Cardiovascular;  Laterality: N/A;  BMI    Body Mass Index: 26.63 kg/m      Reproductive/Obstetrics negative OB ROS                            Anesthesia Physical Anesthesia Plan  ASA: IV  Anesthesia Plan: General ETT   Post-op Pain Management:    Induction: Intravenous  PONV Risk Score and Plan: Ondansetron, Dexamethasone, Midazolam and Treatment may vary due to age or medical condition  Airway Management Planned: Oral ETT  Additional Equipment:   Intra-op Plan:   Post-operative Plan: Extubation in OR  Informed Consent: I have reviewed the patients History and Physical, chart, labs and discussed the procedure including the risks, benefits and alternatives for the proposed anesthesia with the patient or authorized representative who has indicated his/her understanding and acceptance.     Dental Advisory Given  Plan Discussed with: Anesthesiologist, CRNA and Surgeon  Anesthesia Plan Comments: (Patient informed that they are higher risk for complications from anesthesia during this procedure due to  their medical history.  Patient voiced understanding.  Patient consented for risks of anesthesia including but not limited to:  - adverse reactions to medications - damage to eyes, teeth, lips or other oral mucosa - nerve damage due to positioning  - sore throat or hoarseness - Damage to heart, brain, nerves, lungs, other parts of body or loss of life  Patient voiced understanding.)       Anesthesia Quick Evaluation

## 2020-01-29 NOTE — Anesthesia Procedure Notes (Signed)
Procedure Name: Intubation Performed by: Fletcher-Harrison, Amarise Lillo, CRNA Pre-anesthesia Checklist: Patient identified, Emergency Drugs available, Suction available and Patient being monitored Patient Re-evaluated:Patient Re-evaluated prior to induction Oxygen Delivery Method: Circle system utilized Preoxygenation: Pre-oxygenation with 100% oxygen Induction Type: IV induction Ventilation: Mask ventilation without difficulty Laryngoscope Size: McGraph and 3 Grade View: Grade I Tube type: Oral Number of attempts: 1 Airway Equipment and Method: Stylet and Oral airway Placement Confirmation: ETT inserted through vocal cords under direct vision,  positive ETCO2,  breath sounds checked- equal and bilateral and CO2 detector Secured at: 21 cm Tube secured with: Tape Dental Injury: Teeth and Oropharynx as per pre-operative assessment        

## 2020-01-29 NOTE — Op Note (Signed)
OPERATIVE NOTE     PROCEDURE: 1. US guidance for vascular access, bilateral femoral arteries 2. Catheter placement into aorta from bilateral femoral approaches 3. Catheter placement into tertiary branches of the right internal iliac artery from the left femoral approach 4. Placement of a C3 Gore Excluder Endoprosthesis 23 mm proximal, 14 mm distal, 12 cm length main body left with a 27 mm diameter by 12 cm length right contralateral limb 5. Placement of a C3 Gore Excluder bifurcated iliac segment 23 mm proximal, 10 mm for both distal and the right iliac artery which is deployed into the right external iliac artery 6. Placement of a 10 mm distal, 16 mm proximal, 7 cm length iliac extension stent into the right hypogastric artery 7. Placement of a 18 mm distal 12 cm length left iliac extender limb  8. Pro glide closures utilized in a pre-close fashion bilateral common femoral arteries   PRE-OPERATIVE DIAGNOSIS: AAA, iliac artery aneurysms   POST-OPERATIVE DIAGNOSIS: same   SURGEON: [name], MD and Leotis Pain, MD - Co-surgeons   ANESTHESIA: general   ESTIMATED BLOOD LOSS: 50 cc   FINDING(S): 1.  AAA with bilateral common iliac artery aneurysm   SPECIMEN(S):  none   INDICATIONS:   Chad Combs is a 64 y.o. y.o. male who presents with an abdominal aortic aneurysm that is now greater than 5 cm and is associated with right common iliac artery aneurysms. The patient will require endovascular repair to prevent lethal rupture. Risks and benefits are discussed and the patient is agreeable to proceed. Co-surgeons are used to expedite the procedure and reduce operative time as bilateral work needs to be done.   DESCRIPTION: After obtaining full informed written consent, the patient was brought back to the operating room and placed supine upon the operating table.  The patient received IV antibiotics prior to induction.  After obtaining adequate anesthesia, the patient was prepped and draped  in the standard fashion for endovascular AAA repair.  We then began by gaining access to both femoral arteries with US guidance with me working on the right and Dr. Delana Mcauley working on the left.  The femoral arteries were found to be patent and accessed without difficulty with a needle under ultrasound guidance without difficulty on each side and permanent images were recorded.  We then placed 2 proglide devices on each side in a pre-close fashion and placed 8 French sheaths.   The patient was then given 6000 units of intravenous heparin.  Additional 2000 units of heparin were given later in the procedure   The Pigtail catheter was placed into the aorta from the right side. Image was then obtained demonstrating the right iliac bifurcation. Stiff Amplatz wire was then advanced up both the right and left sides. 104 French sheath was then advanced up the right side and the 8 [place] sheath was exchanged for a 12 French sheath up the left side. Subsequently a Glidewire was advanced up the right side and snared by Dr. Delana Sweitzer from the left side and pulled extracorporeally. Using this wire which now is traveling from the right groin over the aortic bifurcation to the left groin the 12 French sheath is advanced and positioned just proximal to the right iliac bifurcation.  Dr. Delana Bukowski initially cannulated the right hypogastric artery with a Kumpe catheter and a Glidewire then exchanged for a Magic torque wire.  Due to a proximal stenosis of the right hypogastric artery, this was predilated with a 5 and a 6 mm balloon.  The right iliac bifurcation device was then prepped on the back table and advanced through the 59 French sheath and positioned above the right iliac bifurcation. The proximal portion was then opened and the left groin sheath was advanced into the main portion of the iliac bifurcation device. Dr. Delana Symonds then used a KMP catheter and a Glidewire the wire catheter combination was negotiate the right internal  iliac artery. Catheter was then introduced down into the tertiary branches hand injection contrast was used to localize the first major bifurcation as well as the distal anatomy.  This represents 3rd order catheter placement. Subsequently, an Amplatz Super Stiff wire was advanced through the catheter and the catheter removed. Next, the 16 mm proximal, 10 mm distal 7 cm long right internal iliac artery stent was deployed bridging the iliac bifurcation device with the right internal iliac device.   The deployment down several centimeters into the right external iliac artery of the iliac branch device was completed.  Angioplasty balloon was then advanced up both the right and left side and simultaneously inflated using a 14 mm balloon in the right external iliac and a 14 mm balloon in the right internal iliac in the proximal portion to get the seal zone between the bifurcated device and the additional stent and then an 8 mm balloon was inflated in the distal portion of the stent in the right internal iliac artery. Once this had been achieved the crossing Glidewire  was removed the left sided sheath was pulled back into the left common iliac and an stiff angle Glidewire was advanced under fluoroscopic guidance into the descending thoracic aorta. The 12 French sheath on the left was then exchanged for a 18 [place] sheath.    Pigtail catheter was then reintroduced and angiography of the abdominal aorta was obtained localized in the renal arteries. Using this image, we selected a 23 mm proximal, 12 cm length Main body device. The main body was then advanced over a stiff wire. A Pigtail catheter was placed up the right side and a magnified image at the renal arteries was performed. The main body was then deployed just below the lowest renal artery which was the left. The Kumpe catheter was used to cannulate the contralateral gate without difficulty and successful cannulation was confirmed by twirling the pigtail catheter  in the main body. We then placed a stiff wire and a retrograde arteriogram was performed through the right femoral sheath.   A 12 cm long 27 mm length distal limb was selected and advance through the contralateral gate of the main body.  The limb was deployed bridging the main body to the bifurcated iliac device. The main body deployment was then completed. Based off the angiographic findings, extension limbs were necessary.  A 18 mm diameter distal 12 cm length left extender limb was then advanced up the left side and deployed at above the left hypogastric artery. All remaining junction points and seals zones were treated with the compliant balloon.    The pigtail catheter was then replaced and a completion angiogram was performed.   No Endoleak was detected on completion angiography. The renal arteries were found to be widely patent.  There was good flow through the left hypogastric artery as well as the stented right hypogastric artery as well.   At this point we elected to terminate the procedure. We secured the pro glide devices for hemostasis on the femoral arteries. The skin incision was closed with a 4-0 Monocryl. Dermabond and pressure  dressing were placed. The patient was taken to the recovery room in stable condition having tolerated the procedure well.   COMPLICATIONS: none   CONDITION: stable   Leotis Pain [facility] Office: [phone]   01/29/2020, 10:34 AM

## 2020-01-29 NOTE — Anesthesia Postprocedure Evaluation (Signed)
Anesthesia Post Note  Patient: Chad Combs  Procedure(s) Performed: ENDOVASCULAR REPAIR/STENT GRAFT (N/A ) ENDOVASCULAR REPAIR/STENT GRAFT (N/A )  Patient location during evaluation: PACU Anesthesia Type: General Level of consciousness: awake and alert Pain management: pain level controlled Vital Signs Assessment: post-procedure vital signs reviewed and stable Respiratory status: spontaneous breathing, nonlabored ventilation, respiratory function stable and patient connected to nasal cannula oxygen Cardiovascular status: blood pressure returned to baseline and stable Postop Assessment: no apparent nausea or vomiting Anesthetic complications: no   No complications documented.   Last Vitals:  Vitals:   01/29/20 1119 01/29/20 1121  BP: 131/68 131/68  Pulse: 89 91  Resp: 11 14  Temp:    SpO2: 94% 94%    Last Pain:  Vitals:   01/29/20 1121  TempSrc:   PainSc: 8                  Arita Miss

## 2020-01-29 NOTE — Progress Notes (Signed)
Care assumed. Report received from R. Hunt, Therapist, sports. Pt resting in bed without distress.

## 2020-01-30 ENCOUNTER — Encounter: Payer: Self-pay | Admitting: Vascular Surgery

## 2020-01-30 ENCOUNTER — Inpatient Hospital Stay: Payer: Medicare Other

## 2020-01-30 LAB — TYPE AND SCREEN
ABO/RH(D): O POS
Antibody Screen: NEGATIVE
Unit division: 0
Unit division: 0

## 2020-01-30 LAB — CBC
HCT: 36.2 % — ABNORMAL LOW (ref 39.0–52.0)
Hemoglobin: 12.3 g/dL — ABNORMAL LOW (ref 13.0–17.0)
MCH: 33.1 pg (ref 26.0–34.0)
MCHC: 34 g/dL (ref 30.0–36.0)
MCV: 97.3 fL (ref 80.0–100.0)
Platelets: 211 10*3/uL (ref 150–400)
RBC: 3.72 MIL/uL — ABNORMAL LOW (ref 4.22–5.81)
RDW: 12.2 % (ref 11.5–15.5)
WBC: 14.8 10*3/uL — ABNORMAL HIGH (ref 4.0–10.5)
nRBC: 0 % (ref 0.0–0.2)

## 2020-01-30 LAB — GLUCOSE, CAPILLARY
Glucose-Capillary: 124 mg/dL — ABNORMAL HIGH (ref 70–99)
Glucose-Capillary: 139 mg/dL — ABNORMAL HIGH (ref 70–99)
Glucose-Capillary: 139 mg/dL — ABNORMAL HIGH (ref 70–99)

## 2020-01-30 LAB — BASIC METABOLIC PANEL
Anion gap: 8 (ref 5–15)
BUN: 12 mg/dL (ref 8–23)
CO2: 23 mmol/L (ref 22–32)
Calcium: 8.4 mg/dL — ABNORMAL LOW (ref 8.9–10.3)
Chloride: 104 mmol/L (ref 98–111)
Creatinine, Ser: 1.03 mg/dL (ref 0.61–1.24)
GFR calc Af Amer: >60 mL/min (ref >60)
GFR calc non Af Amer: >60 mL/min (ref >60)
Glucose, Bld: 149 mg/dL — ABNORMAL HIGH (ref 70–99)
Potassium: 3.8 mmol/L (ref 3.5–5.1)
Sodium: 135 mmol/L (ref 135–145)

## 2020-01-30 LAB — BPAM RBC
Blood Product Expiration Date: 202109162359
Blood Product Expiration Date: 202109162359
Unit Type and Rh: 5100
Unit Type and Rh: 5100

## 2020-01-30 LAB — PREPARE RBC (CROSSMATCH)

## 2020-01-30 LAB — SARS CORONAVIRUS 2 BY RT PCR (HOSPITAL ORDER, PERFORMED IN ~~LOC~~ HOSPITAL LAB): SARS Coronavirus 2: NEGATIVE

## 2020-01-30 MED ORDER — METRONIDAZOLE 500 MG PO TABS: 500.0000 mg | ORAL_TABLET | Freq: Three times a day (TID) | ORAL | 0 refills | Status: DC

## 2020-01-30 MED ORDER — CEFAZOLIN SODIUM-DEXTROSE 2-4 GM/100ML-% IV SOLN
INTRAVENOUS | Status: AC
  Administered 2020-01-30: 2 g via INTRAVENOUS
  Filled 2020-01-30: qty 100

## 2020-01-30 MED ORDER — CIPROFLOXACIN HCL 500 MG PO TABS: 500.0000 mg | ORAL_TABLET | Freq: Two times a day (BID) | ORAL | 0 refills | Status: AC

## 2020-01-30 MED ORDER — GABAPENTIN 300 MG PO CAPS
ORAL_CAPSULE | ORAL | Status: AC
  Filled 2020-01-30: qty 1

## 2020-01-30 MED ORDER — CIPROFLOXACIN HCL 500 MG PO TABS
ORAL_TABLET | ORAL | Status: AC
  Administered 2020-01-30: 500 mg
  Filled 2020-01-30: qty 1

## 2020-01-30 MED ORDER — DOCUSATE SODIUM 100 MG PO CAPS: 100.0000 mg | ORAL_CAPSULE | Freq: Every day | ORAL | 0 refills | Status: DC

## 2020-01-30 MED ORDER — GUAIFENESIN-DM 100-10 MG/5ML PO SYRP: 15.0000 mL | ORAL_SOLUTION | ORAL | 0 refills | Status: DC | PRN

## 2020-01-30 MED ORDER — INSULIN ASPART 100 UNIT/ML ~~LOC~~ SOLN
SUBCUTANEOUS | Status: AC
  Filled 2020-01-30: qty 1

## 2020-01-30 MED ORDER — METOPROLOL TARTRATE 25 MG PO TABS
ORAL_TABLET | ORAL | Status: AC
  Filled 2020-01-30: qty 1

## 2020-01-30 MED ORDER — METRONIDAZOLE 500 MG PO TABS: 500.0000 mg | ORAL_TABLET | Freq: Three times a day (TID) | ORAL | 0 refills | Status: AC

## 2020-01-30 MED ORDER — POTASSIUM CHLORIDE CRYS ER 20 MEQ PO TBCR
EXTENDED_RELEASE_TABLET | ORAL | Status: AC
  Filled 2020-01-30: qty 1

## 2020-01-30 MED ORDER — IOHEXOL 350 MG/ML SOLN
75.0000 mL | Freq: Once | INTRAVENOUS | Status: AC | PRN
  Administered 2020-01-30: 75 mL via INTRAVENOUS

## 2020-01-30 NOTE — Progress Notes (Signed)
Lab at bedside to draw blood.

## 2020-01-30 NOTE — Progress Notes (Signed)
Gave verbal report to [employer], Therapist, sports at [employer].  Waiting for EMS to transport pt to SNF.  Pt resting quietly in bed, VSS, NAD.

## 2020-01-30 NOTE — Progress Notes (Signed)
Released 10 ml air bilateral groin PAD per Dr. Nino Parsley request via nursing communication order.  Pt tolerated well, Site CDI.

## 2020-01-30 NOTE — Progress Notes (Signed)
10 ml air removed from bilateral groin PAD.  Pt tolerated well.  Site CDI

## 2020-01-30 NOTE — Progress Notes (Signed)
10 ml air removed from bilateral groin PAD.  Tolerated well, site CDI.  All air removed.

## 2020-01-30 NOTE — Progress Notes (Signed)
Pt returned to pacu bay 3 from CT

## 2020-01-30 NOTE — Discharge Instructions (Signed)
You may shower. Keep groins clean and dry.

## 2020-01-30 NOTE — TOC Transition Note (Signed)
Transition of Care [facility]) - CM/SW Discharge Note   Patient Details  Name: ARISTIDIS TALERICO MRN: [id number] Date of Birth: May 18, 1956  Transition of [facility]) CM/SW Contact:  Shelbie Hutching, RN Phone Number: 01/30/2020, 2:34 PM   Clinical Narrative:    Patient is medically clear for discharge back to [facility].  Patient will go to room 312B on the C wing.  [facility] will provide transportation.  This RNCM will arrange transportation once the COVID test comes back.  Bedside RN will call report to [facility] at [phone].   Final next level of care: Skilled Nursing Facility Barriers to Discharge: Barriers Resolved   Patient Goals and CMS Choice   CMS Medicare.gov Compare Post Acute Care list provided to:: Patient Choice offered to / list presented to : Patient  Discharge Placement              Patient chooses bed at: [facility] Patient to be transferred to facility by: [facility]   Patient and family notified of of transfer: 01/30/20  Discharge Plan and Services     Post Acute Care Choice: Resumption of Svcs/PTA Provider                               Social Determinants of Health (SDOH) Interventions     Readmission Risk Interventions No flowsheet data found.

## 2020-01-30 NOTE — Progress Notes (Signed)
D/c pt to [facility] via EMS.  VSS, NAD.  Safety maintained.

## 2020-01-30 NOTE — Progress Notes (Signed)
Released 10 ml air bilateral groin PAD per Dr. Milas Kocher request via nursing communication order.  Pt tolerated well, Site CDI.

## 2020-01-30 NOTE — Progress Notes (Signed)
[facility] Daily Progress Note  Subjective: 01/29/20: 1. US guidance for vascular access, bilateral femoral arteries 2. Catheter placement into aorta from bilateral femoral approaches 3. Catheter placement into tertiary branches of the right internal iliac artery from the left femoral approach 4. Placement of a C3Gore Excluder Endoprosthesis 23 mm proximal, 14 mm distal, 12 cm length main body leftwith a 27 mm diameter by 12 cm length right contralateral limb 5. Placement of a C3 Gore Excluder bifurcated iliac segment 23 mm proximal, 10 mm for both distal and the right iliac artery which is deployed into the right external iliac artery 6. Placement of a 10 mm distal, 16 mm proximal, 7 cm length iliac extension stent into the right hypogastric artery 7. Placement of a 18 mm distal 12 cm length left iliac extender limb  8. Pro glide closures utilized in a pre-close fashion bilateral common femoral arteries  Patient without complaint. No issues overnight.   Objective: Vitals:   01/30/20 0345 01/30/20 0545 01/30/20 0830 01/30/20 1002  BP: 117/68 121/78 121/78 97/63  Pulse: 64 68 74   Resp: 14 15 15    Temp:  97.8 F (36.6 C) (!) 97.1 F (36.2 C)   TempSrc:  Temporal    SpO2: 96% 96% 94%   Weight:      Height:        Intake/Output Summary (Last 24 hours) at 01/30/2020 1147 Last data filed at 01/30/2020 1601 Gross per 24 hour  Intake 1175.52 ml  Output 3250 ml  Net -2074.48 ml   Physical Exam: A&Ox2, NAD CV: RRR Pulmonary: CTA Bilaterally Abdomen: Soft, Nontender, Nondistended, (+) bowel sounds Right Groin: PAD in place, no swelling or drainage noted Left Groin: PAD in place, no swelling or drainage noted Vascular: Warm distally, minimal edema   Laboratory: CBC    Component Value Date/Time   WBC 14.8 (H) 01/30/2020 0607   HGB 12.3 (L) 01/30/2020 0607   HCT 36.2 (L) 01/30/2020 0607   PLT 211 01/30/2020 0607   BMET    Component Value Date/Time    NA 135 01/30/2020 0607   K 3.8 01/30/2020 0607   CL 104 01/30/2020 0607   CO2 23 01/30/2020 0607   GLUCOSE 149 (H) 01/30/2020 0607   BUN 12 01/30/2020 0607   CREATININE 1.03 01/30/2020 0607   CALCIUM 8.4 (L) 01/30/2020 0607   GFRNONAA >60 01/30/2020 0607   GFRAA >60 01/30/2020 0932   Assessment/Planning: The patient is a 64 year old male status post endovascular abdominal aortic aneurysm - POD#1  1) no issues overnight 2) PADs removed without issue 3) we will order a CTA neck.  Patient with known occlusion and significant disease.  Will order a CTA in preparation for future carotid endarterectomy. 4)  Once CT is completed the patient can be discharged from a vascular standpoint when medically stable  Discussed with Dr. Eber Hong [facility] PA-C 01/30/2020 11:47 AM

## 2020-01-30 NOTE — Progress Notes (Signed)
Pt off unit,  transported by orderly to CT.

## 2020-01-30 NOTE — Progress Notes (Signed)
Report to Charline Bills, RN

## 2020-01-30 NOTE — Discharge Summary (Signed)
Physician Discharge Summary  Chad Combs:[id number] DOB: September 28, 1955 DOA: 01/24/2020  PCP: Alvester Morin, MD  Admit date: 01/24/2020 Discharge date: 01/30/2020  Recommendations for Outpatient Follow-up:  1. Discharge to SNF 2. Follow up with Dr. Delana Rosenburg (Vascular Surgery) in 1 week. 3. Follow up with PCP in 7-10 days. 4. Have Chemistry and CBC drawn at visit with PCP.   Follow-up Information    Combs, Chad Lory, MD Follow up in 1 week(s).   Specialties: Vascular Surgery, Cardiology, Radiology, Vascular Surgery Why: To see Combs. First post-op visit / review CTA.  Contact information: [place] [address] [phone]               Discharge Diagnoses: Principal diagnosis is #1 1. Acute colitis with hematochezia 2. Infrarenal abdominal aortic aneurysm 3. 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 History of stroke with left sided hemiplegia 5. History of PE and left femoral DVT in 08/2016. S/P IVC filter. 6. Hypertension 7. DM II 8. Hyperlipidemia 9. COPD 10. History of seizures  Discharge Condition: Fair  Disposition: SNF  Diet recommendation: Heart healthy/Modified carbohydrates  Filed Weights   01/24/20 1921 01/25/20 2026  Weight: 80.3 kg 81.8 kg    History of present illness:  Chad Combs is a 64 y.o. male with medical history significant for DM, HTN, COPD, AAA, CVA with left hemiplegia, who presents to the emergency room with a complaint of loose bowel movements with blood noted today by nursing home staff.  Patient reports a 1 month history of diffuse abdominal pain of mild to moderate intensity when eating without associated nausea, vomiting, fever or chills.  He denies cough, chest pain or shortness of breath.  No prior history of rectal bleeding and denies rectal pain or hemorrhoids ED Course: On arrival he was hemodynamically stable.  Blood work significant for  WBC of 16,000, hemoglobin 15.7, lactic acid normal at 1.6, lipase 29, chemistries normal.CT angiogram abdomen and pelvis showed findings consistent with diffuse colitis infrarenal AAA measuring up to 7.3 cm as well as bilateral common iliac artery aneurysmal dilatation of 3.5 and 4.7 cm respectively, subacute compression deformities T12 and L2, mild bilateral perinephric stranding.  The emergency room provider spoke with vascular surgeon Dr. Franchot Gallo who advised that they will place stent once acute colitis is treated.  Patient was started on IV Flagyl and Cipro and hospitalist consulted for admission.  Hospital Course:  Chad Combs is a 64 y.o. malewith medical history significant forDM, HTN, COPD, AAA, CVA with left hemiplegia, who presents to the emergency room with a complaint of loose bowel movements with blood noted today by nursing home staff. Patient reports a 1 month history of diffuse abdominal pain of mild to moderate intensity when eating without associated nausea, vomiting, fever or chills.   Work-up revealed acute colitis.  She was treated with empiric IV antibiotics, analgesics and bowel rest.  Acute colitis has improved.  Work-up also showed 7.3 cm AAA and bilateral common iliac artery aneurysm.  He was seen in consultation by the vascular surgeon and he underwent endovascular repair with stent placement on 01/29/2020.  CTA of the neck was performed today as ordered by Dr. Delana Dioguardi. It has demonstrated:  Atherosclerotic aortic arch. 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  Chronic occlusion right internal carotid artery.  Left internal carotid artery stenting across the bifurcation. Stent is patent. No  stenosis or aneurysm identified  Mild stenosis origin of left vertebral artery. Right vertebral artery patent without stenosis.  Patchy right upper lobe airspace disease possibly atelectasis or pneumonia. Apical  emphysema.  Today's assessment: S: The patient is resting comfortably. No new complaints. O: Vitals:  Vitals:   01/30/20 1002 01/30/20 1237  BP: 97/63 101/67  Pulse:  71  Resp:  12  Temp:  (!) 97 F (36.1 C)  SpO2:  95%   Exam:  Constitutional:  . The patient is awake, alert, and oriented x 3. No acute distress. Respiratory:  . No increased work of breathing. . No wheezes, rales, or rhonchi . No tactile fremitus Cardiovascular:  . Regular rate and rhythm . No murmurs, ectopy, or gallups. . No lateral PMI. No thrills. Abdomen:  . Abdomen is soft, non-tender, non-distended . No hernias, masses, or organomegaly . Normoactive bowel sounds.  Musculoskeletal:  . No cyanosis, clubbing, or edema Skin:  . No rashes, lesions, ulcers . palpation of skin: no induration or nodules Neurologic:  . CN 2-12 intact . Sensation all 4 extremities intact Psychiatric:  . Mental status o Mood, affect appropriate o Orientation to person, place, time  . judgment and insight appear intact   Discharge Instructions   Allergies as of 01/30/2020      Reactions   Bee Pollen Hives, Swelling      Medication List    STOP taking these medications   HYDROcodone-acetaminophen 5-325 MG tablet Commonly known as: NORCO/VICODIN   tiZANidine 2 MG tablet Commonly known as: ZANAFLEX     TAKE these medications   amLODipine 5 MG tablet Commonly known as: NORVASC Take 5 mg by mouth daily.   aspirin 81 MG chewable tablet Chew 81 mg by mouth daily.   atorvastatin 20 MG tablet Commonly known as: LIPITOR Take 20 mg by mouth daily.   budesonide-formoterol 80-4.5 MCG/ACT inhaler Commonly known as: SYMBICORT Inhale 2 puffs into the lungs every 12 (twelve) hours.   ciprofloxacin 500 MG tablet Commonly known as: CIPRO Take 1 tablet (500 mg total) by mouth 2 (two) times daily for 4 days.   clopidogrel 75 MG tablet Commonly known as: PLAVIX Take 75 mg by mouth daily.   divalproex 125 MG  DR tablet Commonly known as: DEPAKOTE Take 125 mg by mouth at bedtime.   docusate sodium 100 MG capsule Commonly known as: COLACE Take 1 capsule (100 mg total) by mouth daily. Start taking on: January 31, 2020   ezetimibe 10 MG tablet Commonly known as: ZETIA Take 1 tablet (10 mg total) by mouth daily at 6 PM. What changed: when to take this   gabapentin 300 MG capsule Commonly known as: NEURONTIN Take 300 mg by mouth daily.   guaiFENesin-dextromethorphan 100-10 MG/5ML syrup Commonly known as: ROBITUSSIN DM Take 15 mLs by mouth every 4 (four) hours as needed for cough.   Levemir FlexTouch 100 UNIT/ML FlexPen Generic drug: insulin detemir Inject 24 Units into the skin at bedtime.   levETIRAcetam 250 MG tablet Commonly known as: KEPPRA Take 250 mg by mouth 2 (two) times daily.   lisinopril 40 MG tablet Commonly known as: ZESTRIL Take 40 mg by mouth daily.   metoprolol tartrate 25 MG tablet Commonly known as: LOPRESSOR Take 0.5 tablets (12.5 mg total) by mouth 2 (two) times daily.   metroNIDAZOLE 500 MG tablet Commonly known as: FLAGYL Take 1 tablet (500 mg total) by mouth every 8 (eight) hours for 4 days.   ondansetron 4 MG tablet  Commonly known as: ZOFRAN Take 4 mg by mouth at bedtime.   Ozempic (0.25 or 0.5 MG/DOSE) 2 MG/1.5ML Sopn Generic drug: Semaglutide(0.25 or 0.5MG /DOS) Inject 0.5 mg into the skin once a week. Friday   pantoprazole 40 MG tablet Commonly known as: PROTONIX Take 40 mg by mouth 2 (two) times daily.   polyethylene glycol 17 g packet Commonly known as: MIRALAX / GLYCOLAX Take 17 g by mouth daily.   potassium chloride SA 20 MEQ tablet Commonly known as: KLOR-CON Take 20 mEq by mouth 2 (two) times daily.   senna 8.6 MG Tabs tablet Commonly known as: SENOKOT Take 2 tablets by mouth at bedtime.   sertraline 100 MG tablet Commonly known as: ZOLOFT Take 100 mg by mouth daily.   simethicone 125 MG chewable tablet Commonly known as:  MYLICON Chew 960 mg by mouth 3 (three) times daily.   tamsulosin 0.4 MG Caps capsule Commonly known as: FLOMAX Take 1 capsule (0.4 mg total) by mouth daily after supper.      Allergies  Allergen Reactions  . Bee Pollen Hives and Swelling    The results of significant diagnostics from this hospitalization (including imaging, microbiology, ancillary and laboratory) are listed below for reference.    Significant Diagnostic Studies: PERIPHERAL VASCULAR CATHETERIZATION  Result Date: 01/29/2020 See Op Note  CT Angio Abd/Pel W and/or Wo Contrast  Result Date: 01/24/2020 CLINICAL DATA:  Diarrhea and rectal bleeding which began today with nausea for 4 weeks EXAM: CTA ABDOMEN AND PELVIS WITHOUT AND WITH CONTRAST TECHNIQUE: Multidetector CT imaging of the abdomen and pelvis was performed using the standard protocol during bolus administration of intravenous contrast. Multiplanar reconstructed images and MIPs were obtained and reviewed to evaluate the vascular anatomy. CONTRAST:  173mL OMNIPAQUE IOHEXOL 350 MG/ML SOLN COMPARISON:  CT angiography abdomen pelvis 05/19/2017 FINDINGS: VASCULAR Aorta: There is extensive atherosclerotic plaque throughout the native abdominal aorta. Redemonstration of the large fusiform aneurysm of the infrarenal abdominal aorta which does extend into the iliac bifurcation. Maximal is aortic diameter to 7.3 cm is increased from the 6 point 6 cm diameter at a similar level on comparison imaging. Additionally, there has been progressive accumulation of the endoluminal plaque resulting in luminal narrowing with the residual aortic lumen measuring up to 1.9 cm, previously 2.7 cm. Aneurysmal dilatation begins just below the left renal artery origin. Celiac: Hook like configuration of the celiac origin compatible with compression by the median arcuate ligament likely with some slight contribution of mild calcified ostial plaque which results in some moderate luminal narrowing with  poststenotic dilatation to 8 mm, the overall configuration of which is similar to comparison. Otherwise normal opacification with typical branching pattern. No acute abnormality SMA: Mild ostial plaque narrowing of the SMA origin. Vessel otherwise normally opacified without other significant stenosis. Typical branching pattern. No evidence of aneurysm, dissection or vasculitis. Renals: Single renal arteries bilaterally. Mild plaque narrowing at both renal ostia. No other significant stenosis however with normal opacification. No evidence of aneurysm, dissection or vasculitis. No features of fibromuscular dysplasia. IMA: Segmental occlusion of the IMA origin with more distal opacification likely supplied via left colic collateralization. No other acute abnormality. Inflow: Extension of the large fusiform aneurysm into the common iliac artery origins. The right common iliac measuring up to 4.7 cm in diameter, left common iliac measuring up to 3.5 cm in diameter, increased from 4.1 and 2.4 cm respectively. The right common iliac dilatation extends to the bifurcation with the internal and external iliac arteries. Moderate  to severe stenosis at the origin of the left internal iliac but with normal distal opacification. Mild stenosis at the right external iliac origin. The left common iliac artery returns to normal caliber by the mid vessel with some moderate plaque at the internal iliac artery origin and some segmental narrowing at the branches of the internal and external divisions as well as additional moderate narrowing at the external iliac origin with multifocal segmental narrowing to the outflow levels. Proximal Outflow: Atheromatous plaque is present in the bilateral common femoral arteries without significant flow-limiting stenosis at the origins of the superficial femoral and profundus arteries bilaterally. No acute luminal abnormality is seen. Veins: Venous phase imaging demonstrates normal opacification of the  hepatic and portal veins. Infrarenal caval filter is in similar position to prior with some lateral tilt. No other significant venous findings Review of the MIP images confirms the above findings. NON-VASCULAR Lower chest: Dependent atelectatic changes are present in the lung bases. Normal heart size. No pericardial effusion. Increased fat attenuation at the interatrial septum sparing the fossa ovalis. Could reflect mild in lipomatous hypertrophy of the intra-atrial septum. Hepatobiliary: No worrisome focal liver lesions. Smooth liver surface contour. Normal hepatic attenuation. Some dependently layering density in the gallbladder may reflect biliary sand or sludge. No visible calcified gallstones. No biliary ductal dilatation. Pancreas: Unremarkable. No pancreatic ductal dilatation or surrounding inflammatory changes. Spleen: Normal in size. No concerning splenic lesions. Adrenals/Urinary Tract: Normal adrenal glands. Kidneys enhance symmetrically and uniformly. Few subcentimeter hypodense foci too small to fully characterize on CT imaging but statistically likely benign. Stable mild bilateral perinephric stranding, nonspecific. No concerning renal mass, urolithiasis or hydronephrosis. Stomach/Bowel: Distal esophagus, stomach and duodenal sweep are unremarkable. No small bowel wall thickening or dilatation. No evidence of obstruction. Diffusely fluid-filled appearance of the colon with lack of formed stool. No proximal colonic thickening or dilatation. There is marked thickening in stranding which is contiguous from the level of the distal sigmoid to the rectum with edematous mural thickening and serpiginous densities suggesting hemorrhoidal collaterals. Accumulation of attenuation in the folds of the mucosa of the rectum between the precontrast and venous phases is most suggestive mucosal hyperemia. Some reactive stranding noted the adjacent mesentery presacral space. Lymphatic: No suspicious or enlarged lymph  nodes in the included lymphatic chains. Reproductive: The prostate and seminal vesicles are unremarkable. Other: Inflammatory changes centered upon the rectosigmoid as above. No abdominopelvic free air. No pneumatosis or portal venous gas. No bowel containing hernias. Bilateral fat containing inguinal hernias, left greater than right. Musculoskeletal: The osseous structures appear diffusely demineralized which may limit detection of small or nondisplaced fractures. Subacute appearing compression deformities at T12 and L2 with superimposed Schmorl's node formations are new since the 2018 comparison with a to 50% height loss at T12 and 40% height loss at L2. No other acute or suspicious osseous abnormalities. Multilevel degenerative changes are present in the imaged portions of the spine. Features most pronounced L5-S1. Additional degenerative changes in the hips and pelvis with partial ankylosis across the SI joints. IMPRESSION: VASCULAR. 1. Circumferential thickening of the rectosigmoid with associated inflammatory changes of the colon compatible with a nonspecific colitis. Accumulation of attenuation in the folds of the rectum between the precontrast and venous phases is most suggestive of mucosal hyperemia associated with the more diffuse colitis as well as hemorrhoidal collaterals. 2. Interval increase in size of the large infrarenal abdominal aortic aneurysm extending into the iliac arteries, now measuring up to 7.3 cm in diameter with progressive  accumulation of the endoluminal plaque resulting in luminal narrowing with the residual aortic lumen measuring up to 1.9 cm. 3. Interval increase in size of the bilateral common iliac artery aneurysmal dilatation as well, now measuring up to 4.7 cm in diameter on the right and 3.5 cm on the left. 4. Aortic Atherosclerosis (ICD10-I70.0). 5. Moderate to severe stenosis of the left internal iliac artery origin. 6. Mild plaque narrowing at the celiac axis as well as a  focal like configuration suggesting some compression by the median arcuate ligament with poststenotic dilatation. 7. Mild stenosis of the right external iliac origin. 8. Segmental occlusion of the IMA origin with more distal opacification likely supplied via left colic collateralization. 9. Mild ostial narrowing of the SMA and bilateral renal artery origins. 10. Coronary artery atherosclerosis. 11. Features suggest mild lipomatous hypertrophy of the inter-atrial septum. 12. Infrarenal caval filter is in similar position to prior with some lateral tilt. NON-VASCULAR 1. Subacute appearing compression deformities at T12 and L2 with superimposed Schmorl's node formations are new since the 2018 comparison with a to 50% height loss at T12 and 40% height loss at L2. 2. Stable mild bilateral perinephric stranding, nonspecific. 3. Hyperdense material in the gallbladder, possibly biliary sludge a sand. Correlate for right upper quadrant symptoms. Electronically Signed   By: Lovena Le M.D.   On: 01/24/2020 22:29    Microbiology: Recent Results (from the past 240 hour(s))  SARS Coronavirus 2 by RT PCR (hospital order, performed in [facility] hospital lab) Nasopharyngeal Nasopharyngeal Swab     Status: None   Collection Time: 01/24/20  8:04 PM   Specimen: Nasopharyngeal Swab  Result Value Ref Range Status   SARS Coronavirus 2 NEGATIVE NEGATIVE Final    Comment: (NOTE) SARS-CoV-2 target nucleic acids are NOT DETECTED.  The SARS-CoV-2 RNA is generally detectable in upper and lower respiratory specimens during the acute phase of infection. The lowest concentration of SARS-CoV-2 viral copies this assay can detect is 250 copies / mL. A negative result does not preclude SARS-CoV-2 infection and should not be used as the sole basis for treatment or other patient management decisions.  A negative result may occur with improper specimen collection / handling, submission of specimen other than nasopharyngeal swab,  presence of viral mutation(s) within the areas targeted by this assay, and inadequate number of viral copies (<250 copies / mL). A negative result must be combined with clinical observations, patient history, and epidemiological information.  Fact Sheet for Patients:   StrictlyIdeas.no  Fact Sheet for Healthcare Providers: BankingDealers.co.za  This test is not yet approved or  cleared by the [place] FDA and has been authorized for detection and/or diagnosis of SARS-CoV-2 by FDA under an Emergency Use Authorization (EUA).  This EUA will remain in effect (meaning this test can be used) for the duration of the COVID-19 declaration under Section 564(b)(1) of the Act, 21 U.S.C. section 360bbb-3(b)(1), unless the authorization is terminated or revoked sooner.  Performed at [facility], [address] [place], [place] [address]      Labs: Basic Metabolic Panel: Recent Labs  Lab 01/24/20 2043 01/27/20 0606 01/29/20 0528 01/30/20 0607  NA 141 137 136 135  K 3.9 3.8 3.9 3.8  CL 107 104 103 104  CO2 24 26 24 23   GLUCOSE 164* 127* 132* 149*  BUN 23 9 10 12   CREATININE 1.07 0.93 0.88 1.03  CALCIUM 9.0 8.6* 8.8* 8.4*  MG  --   --  2.0  --  Liver Function Tests: Recent Labs  Lab 01/24/20 2043  AST 29  ALT 26  ALKPHOS 65  BILITOT 1.1  PROT 7.6  ALBUMIN 3.7   Recent Labs  Lab 01/24/20 2043  LIPASE 29   No results for input(s): AMMONIA in the last 168 hours. CBC: Recent Labs  Lab 01/24/20 2043 01/25/20 0733 01/27/20 0606 01/29/20 0528 01/30/20 0607  WBC 16.9* 10.5 7.7 8.1 14.8*  NEUTROABS  --   --  5.3  --   --   HGB 15.7 13.2 14.0 13.8 12.3*  HCT 45.6 38.5* 40.3 40.9 36.2*  MCV 96.0 97.0 94.6 97.1 97.3  PLT 197 166 177 204 211   Cardiac Enzymes: No results for input(s): CKTOTAL, CKMB, CKMBINDEX, TROPONINI in the last 168 hours. BNP: BNP (last 3 results) No results for input(s): BNP in the  last 8760 hours.  ProBNP (last 3 results) No results for input(s): PROBNP in the last 8760 hours.  CBG: Recent Labs  Lab 01/29/20 1057 01/29/20 1730 01/29/20 2151 01/30/20 0759 01/30/20 1144  GLUCAP 193* 204* 188* 124* 139*    Principal Problem:   Acute colitis Active Problems:   Left hemiplegia (HCC)   Benign essential HTN   Chronic obstructive pulmonary disease (HCC)   Abdominal aortic aneurysm (AAA) greater than 5.5 cm in diameter in male [facility])   Type 2 diabetes mellitus with hyperlipidemia ([place])   Hematochezia   Time coordinating discharge: 38 minutes.  Signed:        Fadi Menter, DO [facility]  01/30/2020, 1:44 PM

## 2020-01-30 NOTE — Progress Notes (Signed)
Bilateral groin PADs removed.  Pt tolerated well.  Site CDI.

## 2020-05-23 ENCOUNTER — Other Ambulatory Visit
Admission: RE | Admit: 2020-05-23 | Discharge: 2020-05-23 | Disposition: A | Discharge status: Home / Self Care | Payer: Medicare Other | Source: Ambulatory Visit | Attending: Gastroenterology | Admitting: Gastroenterology

## 2020-05-23 ENCOUNTER — Other Ambulatory Visit: Payer: Self-pay

## 2020-05-23 ENCOUNTER — Encounter: Payer: Self-pay | Admitting: *Deleted

## 2020-05-23 DIAGNOSIS — Z01812 Encounter for preprocedural laboratory examination: Secondary | ICD-10-CM | POA: Diagnosis present

## 2020-05-23 DIAGNOSIS — Z20822 Contact with and (suspected) exposure to covid-19: Secondary | ICD-10-CM | POA: Insufficient documentation

## 2020-05-25 LAB — SARS CORONAVIRUS 2 (TAT 6-24 HRS): SARS Coronavirus 2: NEGATIVE

## 2020-05-26 ENCOUNTER — Ambulatory Visit: Payer: Medicare Other | Admitting: Anesthesiology

## 2020-05-26 ENCOUNTER — Encounter
Admission: RE | Disposition: A | Discharge status: Nursing Home (Includes ICF) | Payer: Self-pay | Source: Home / Self Care | Attending: Gastroenterology

## 2020-05-26 ENCOUNTER — Other Ambulatory Visit: Payer: Self-pay

## 2020-05-26 ENCOUNTER — Encounter: Payer: Self-pay | Admitting: *Deleted

## 2020-05-26 ENCOUNTER — Ambulatory Visit
Admission: RE | Admit: 2020-05-26 | Discharge: 2020-05-26 | Disposition: A | Discharge status: Other Acute Inpatient Hospital | Payer: Medicare Other | Attending: Gastroenterology | Admitting: Gastroenterology

## 2020-05-26 DIAGNOSIS — K635 Polyp of colon: Secondary | ICD-10-CM | POA: Insufficient documentation

## 2020-05-26 DIAGNOSIS — R109 Unspecified abdominal pain: Secondary | ICD-10-CM | POA: Diagnosis not present

## 2020-05-26 DIAGNOSIS — I719 Aortic aneurysm of unspecified site, without rupture: Secondary | ICD-10-CM | POA: Diagnosis not present

## 2020-05-26 DIAGNOSIS — I69354 Hemiplegia and hemiparesis following cerebral infarction affecting left non-dominant side: Secondary | ICD-10-CM | POA: Diagnosis not present

## 2020-05-26 DIAGNOSIS — Z7951 Long term (current) use of inhaled steroids: Secondary | ICD-10-CM | POA: Diagnosis not present

## 2020-05-26 DIAGNOSIS — Z7982 Long term (current) use of aspirin: Secondary | ICD-10-CM | POA: Insufficient documentation

## 2020-05-26 DIAGNOSIS — R933 Abnormal findings on diagnostic imaging of other parts of digestive tract: Secondary | ICD-10-CM | POA: Diagnosis present

## 2020-05-26 DIAGNOSIS — D128 Benign neoplasm of rectum: Secondary | ICD-10-CM | POA: Diagnosis not present

## 2020-05-26 DIAGNOSIS — Z7902 Long term (current) use of antithrombotics/antiplatelets: Secondary | ICD-10-CM | POA: Diagnosis not present

## 2020-05-26 DIAGNOSIS — Z794 Long term (current) use of insulin: Secondary | ICD-10-CM | POA: Diagnosis not present

## 2020-05-26 DIAGNOSIS — Z79899 Other long term (current) drug therapy: Secondary | ICD-10-CM | POA: Diagnosis not present

## 2020-05-26 DIAGNOSIS — D122 Benign neoplasm of ascending colon: Secondary | ICD-10-CM | POA: Insufficient documentation

## 2020-05-26 HISTORY — DX: Anemia, unspecified: D64.9

## 2020-05-26 HISTORY — PX: COLONOSCOPY WITH PROPOFOL: SHX5780

## 2020-05-26 HISTORY — DX: Depression, unspecified: F32.A

## 2020-05-26 HISTORY — DX: Hemiplegia, unspecified affecting unspecified side: G81.90

## 2020-05-26 LAB — GLUCOSE, CAPILLARY: Glucose-Capillary: 87 mg/dL (ref 70–99)

## 2020-05-26 SURGERY — COLONOSCOPY WITH PROPOFOL
Anesthesia: General

## 2020-05-26 MED ORDER — PROPOFOL 500 MG/50ML IV EMUL
INTRAVENOUS | Status: DC | PRN
  Administered 2020-05-26: 100 ug/kg/min via INTRAVENOUS

## 2020-05-26 MED ORDER — SODIUM CHLORIDE 0.9 % IV SOLN: INTRAVENOUS | Status: DC

## 2020-05-26 MED ORDER — GLYCOPYRROLATE 0.2 MG/ML IJ SOLN
INTRAMUSCULAR | Status: DC | PRN
  Administered 2020-05-26: .2 mg via INTRAVENOUS

## 2020-05-26 MED ORDER — LIDOCAINE HCL (CARDIAC) PF 100 MG/5ML IV SOSY
PREFILLED_SYRINGE | INTRAVENOUS | Status: DC | PRN
  Administered 2020-05-26: 100 mg via INTRAVENOUS

## 2020-05-26 MED ORDER — PROPOFOL 10 MG/ML IV BOLUS
INTRAVENOUS | Status: DC | PRN
  Administered 2020-05-26: 40 mg via INTRAVENOUS

## 2020-05-26 MED ORDER — PHENYLEPHRINE HCL (PRESSORS) 10 MG/ML IV SOLN
INTRAVENOUS | Status: DC | PRN
  Administered 2020-05-26 (×2): 100 ug via INTRAVENOUS

## 2020-05-26 NOTE — Anesthesia Preprocedure Evaluation (Signed)
Anesthesia Evaluation  Patient identified by MRN, date of birth, ID band Patient awake    Reviewed: Allergy & Precautions, H&P , NPO status , Patient's Chart, lab work & pertinent test results  History of Anesthesia Complications Negative for: history of anesthetic complications  Airway Mallampati: III  TM Distance: <3 FB Neck ROM: limited    Dental  (+) Chipped, Poor Dentition, Missing   Pulmonary shortness of breath and with exertion, COPD, Current Smoker and Patient abstained from smoking., former smoker,    Pulmonary exam normal        Cardiovascular hypertension, (-) angina+ Peripheral Vascular Disease  (-) Past MI Normal cardiovascular exam     Neuro/Psych PSYCHIATRIC DISORDERS Depression CVA (left sided weakness), Residual Symptoms    GI/Hepatic negative GI ROS, Neg liver ROS, neg GERD  ,  Endo/Other  diabetes, Type 2, Insulin Dependent  Renal/GU      Musculoskeletal   Abdominal   Peds negative pediatric ROS (+)  Hematology negative hematology ROS (+) anemia ,   Anesthesia Other Findings Past Medical History: No date: AAA (abdominal aortic aneurysm) ([place])     Comment:  a. 08/2016 CTA: 3.1cm saccular aneurysm of distal Ao arch              w/ penetrating atheromatous ulcer. 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 09/2017 CTA               chest/abd/pelvis [facility]): 6.3cm infrarenal AAA, unchanged               bilateral CIA aneurysms (R 3.5cm, L 2.8cm)-->pt refused               intervention. No date: Blind right eye No date: Carotid arterial disease ([place])     Comment:  a. 12/2016 CTA Neck: RICA 100%. LICA >47%; b. 01/2955 s/p               trans aortic LICA revascularization and stent placement               [facility]); c. 03/2017 Carotid U/S [facility]): No significant LICA               stenosis, patent stent. No  date: COPD (chronic obstructive pulmonary disease) (HCC) No date: Diabetes mellitus without complication (HCC) No date: Diastolic dysfunction     Comment:  a. 08/2016 Echo: EF 60%, Gr1 DD, mild MR, mildly dil               LA/RV. No cardiac source of emboli. No date: Femoral DVT (deep venous thrombosis) (HCC)     Comment:  a. 08/2016 L Fem Vein DVT ->xarelto/IVC filter. Xarelto               subsequently d/c'd. No date: History of pulmonary embolism     Comment:  a. 08/2016 following CVA-->chronic xarelto, s/p IVC               filter. Xarelto subsequently d/c'd. No date: History of right MCA stroke     Comment:  a. 08/2016 MRI: R MCA infarct w/ occlusion of RICA & R               MCA.  No date: Hypertension No date: Tobacco abuse  Past  Surgical History: 08/24/2016: IVC FILTER INSERTION; N/A     Comment:  Procedure: IVC Filter Insertion;  Surgeon: Katha Cabal, MD;  Location: [place] [facility];  Service:               Cardiovascular;  Laterality: N/A;  BMI    Body Mass Index: 26.63 kg/m      Reproductive/Obstetrics negative OB ROS                             Anesthesia Physical  Anesthesia Plan  ASA: IV  Anesthesia Plan: General   Post-op Pain Management:    Induction: Intravenous  PONV Risk Score and Plan: Propofol infusion  Airway Management Planned: Nasal Cannula  Additional Equipment:   Intra-op Plan:   Post-operative Plan:   Informed Consent: I have reviewed the patients History and Physical, chart, labs and discussed the procedure including the risks, benefits and alternatives for the proposed anesthesia with the patient or authorized representative who has indicated his/her understanding and acceptance.     Dental Advisory Given  Plan Discussed with: Anesthesiologist, CRNA and Surgeon  Anesthesia Plan Comments: (Patient informed that they are higher risk for complications from anesthesia during this procedure  due to their medical history.  Patient voiced understanding.  Patient consented for risks of anesthesia including but not limited to:  - adverse reactions to medications - damage to eyes, teeth, lips or other oral mucosa - nerve damage due to positioning  - sore throat or hoarseness - Damage to heart, brain, nerves, lungs, other parts of body or loss of life  Patient voiced understanding.)        Anesthesia Quick Evaluation

## 2020-05-26 NOTE — Op Note (Signed)
[facility] Gastroenterology Patient Name: Chad Combs Procedure Date: 05/26/2020 9:12 AM MRN: [id number] Account #: [id number] Date of Birth: [birth year] Admit Type: Outpatient Age: 64 Room: [facility] ENDO ROOM 3 Gender: Male Note Status: Finalized Procedure:             Colonoscopy Indications:           Abnormal CT of the GI tract Providers:             Andrey Farmer MD, MD Referring MD:          No Local Md, MD (Referring MD) Medicines:             Monitored Anesthesia Care Complications:         No immediate complications. Estimated blood loss:                         Minimal. Procedure:             Pre-Anesthesia Assessment:                        - Prior to the procedure, a History and Physical was                         performed, and patient medications and allergies were                         reviewed. The patient is competent. The risks and                         benefits of the procedure and the sedation options and                         risks were discussed with the patient. All questions                         were answered and informed consent was obtained.                         Patient identification and proposed procedure were                         verified by the physician, the nurse, the anesthetist                         and the technician in the endoscopy suite. Mental                         Status Examination: alert and oriented. Airway                         Examination: normal oropharyngeal airway and neck                         mobility. Respiratory Examination: clear to                         auscultation. CV Examination: normal. Prophylactic  Antibiotics: The patient does not require prophylactic                         antibiotics. Prior Anticoagulants: The patient has                         taken Plavix (clopidogrel), last dose was 5 days prior                         to procedure. ASA Grade  Assessment: III - A patient                         with severe systemic disease. After reviewing the                         risks and benefits, the patient was deemed in                         satisfactory condition to undergo the procedure. The                         anesthesia plan was to use monitored anesthesia care                         (MAC). Immediately prior to administration of                         medications, the patient was re-assessed for adequacy                         to receive sedatives. The heart rate, respiratory                         rate, oxygen saturations, blood pressure, adequacy of                         pulmonary ventilation, and response to care were                         monitored throughout the procedure. The physical                         status of the patient was re-assessed after the                         procedure.                        After obtaining informed consent, the colonoscope was                         passed under direct vision. Throughout the procedure,                         the patient's blood pressure, pulse, and oxygen                         saturations were monitored continuously. The  Colonoscope was introduced through the anus and                         advanced to the the cecum, identified by appendiceal                         orifice and ileocecal valve. The colonoscopy was                         performed without difficulty. The patient tolerated                         the procedure well. The quality of the bowel                         preparation was adequate to identify polyps. Findings:      The perianal and digital rectal examinations were normal.      Two sessile polyps were found in the ascending colon. The polyps were 1       to 2 mm in size. These polyps were removed with a jumbo cold forceps.       Resection and retrieval were complete. Estimated blood loss was minimal.       A 2 mm polyp was found in the sigmoid colon. The polyp was sessile. The       polyp was removed with a jumbo cold forceps. Resection and retrieval       were complete. Estimated blood loss was minimal.      A 2 mm polyp was found in the rectum. The polyp was sessile. The polyp       was removed with a jumbo cold forceps. Resection and retrieval were       complete. Estimated blood loss was minimal.      Non-bleeding internal hemorrhoids were found during retroflexion. The       hemorrhoids were Grade I (internal hemorrhoids that do not prolapse).      The exam was otherwise without abnormality on direct and retroflexion       views. Impression:            - Two 1 to 2 mm polyps in the ascending colon, removed                         with a jumbo cold forceps. Resected and retrieved.                        - One 2 mm polyp in the sigmoid colon, removed with a                         jumbo cold forceps. Resected and retrieved.                        - One 2 mm polyp in the rectum, removed with a jumbo                         cold forceps. Resected and retrieved.                        - Non-bleeding internal hemorrhoids.                        -  The examination was otherwise normal on direct and                         retroflexion views. Recommendation:        - Discharge patient to home.                        - Resume previous diet.                        - Resume Plavix (clopidogrel) at prior dose tomorrow.                        - Await pathology results.                        - Repeat colonoscopy in 3 years for surveillance.                        - Return to referring physician as previously                         scheduled. Procedure Code(s):     --- Professional ---                        [phone], Colonoscopy, flexible; with biopsy, single or                         multiple Diagnosis Code(s):     --- Professional ---                        K63.5, Polyp of colon                         K62.1, Rectal polyp                        K64.0, First degree hemorrhoids                        R93.3, Abnormal findings on diagnostic imaging of                         other parts of digestive tract CPT copyright 2019 [employer] Association. All rights reserved. The codes documented in this report are preliminary and upon coder review may  be revised to meet current compliance requirements. Andrey Farmer, MD Andrey Farmer MD, MD 05/26/2020 9:46:23 AM Number of Addenda: 0 Note Initiated On: 05/26/2020 9:12 AM Scope Withdrawal Time: 0 hours 12 minutes 37 seconds  Total Procedure Duration: 0 hours 22 minutes 7 seconds  Estimated Blood Loss:  Estimated blood loss was minimal.      [facility]

## 2020-05-26 NOTE — Transfer of Care (Signed)
Immediate Anesthesia Transfer of Care Note  Patient: Chad Combs  Procedure(s) Performed: COLONOSCOPY WITH PROPOFOL (N/A )  Patient Location: Endoscopy Unit  Anesthesia Type:General  Level of Consciousness: drowsy and responds to stimulation  Airway & Oxygen Therapy: Patient Spontanous Breathing and Patient connected to face mask oxygen  Post-op Assessment: Report given to RN and Post -op Vital signs reviewed and stable  Post vital signs: Reviewed and stable  Last Vitals:  Vitals Value Taken Time  BP    Temp    Pulse    Resp    SpO2      Last Pain:  Vitals:   05/26/20 0946  TempSrc: (P) Tympanic  PainSc:          Complications: No complications documented.

## 2020-05-26 NOTE — H&P (Signed)
Outpatient short stay form Pre-procedure 05/26/2020 8:59 AM Chad Miyamoto MD, MPH  Primary Physician: Dr. Joaquin Courts  Reason for visit:  Abnormal Imaging  History of present illness:   64 y/o gentleman with history of repaired aortic aneurysm on plavix here for abnormal imaging on CT scan that showed colitis. Also with abdominal pain after eating. He has a history of stroke with left-hemiplegia.    Current Facility-Administered Medications:  .  0.9 %  sodium chloride infusion, , Intravenous, Continuous, Adaijah Endres, Hilton Cork, MD, Last Rate: 20 mL/hr at 05/26/20 0857, New Bag at 05/26/20 0857  Medications Prior to Admission  Medication Sig Dispense Refill Last Dose  . aspirin 81 MG chewable tablet Chew 81 mg by mouth daily.   05/25/2020 at Unknown time  . atorvastatin (LIPITOR) 20 MG tablet Take 20 mg by mouth daily.   05/25/2020 at Unknown time  . budesonide-formoterol (SYMBICORT) 80-4.5 MCG/ACT inhaler Inhale 2 puffs into the lungs every 12 (twelve) hours.   05/25/2020 at Unknown time  . divalproex (DEPAKOTE) 125 MG DR tablet Take 125 mg by mouth at bedtime.   05/25/2020 at Unknown time  . docusate sodium (COLACE) 100 MG capsule Take 1 capsule (100 mg total) by mouth daily. 10 capsule 0 05/25/2020 at Unknown time  . ezetimibe (ZETIA) 10 MG tablet Take 1 tablet (10 mg total) by mouth daily at 6 PM. (Patient taking differently: Take 10 mg by mouth daily. )   05/25/2020 at Unknown time  . gabapentin (NEURONTIN) 300 MG capsule Take 300 mg by mouth daily.   05/25/2020 at Unknown time  . levETIRAcetam (KEPPRA) 250 MG tablet Take 250 mg by mouth 2 (two) times daily.   05/25/2020 at Unknown time  . lisinopril (ZESTRIL) 40 MG tablet Take 40 mg by mouth daily.   05/25/2020 at Unknown time  . metoprolol tartrate (LOPRESSOR) 25 MG tablet Take 0.5 tablets (12.5 mg total) by mouth 2 (two) times daily. 60 tablet 2 05/25/2020 at Unknown time  . pantoprazole (PROTONIX) 40 MG tablet Take 40 mg by mouth 2 (two)  times daily.    05/25/2020 at Unknown time  . potassium chloride SA (KLOR-CON) 20 MEQ tablet Take 20 mEq by mouth 2 (two) times daily.   05/25/2020 at Unknown time  . sertraline (ZOLOFT) 100 MG tablet Take 100 mg by mouth daily.   05/25/2020 at Unknown time  . tamsulosin (FLOMAX) 0.4 MG CAPS capsule Take 1 capsule (0.4 mg total) by mouth daily after supper. 30 capsule  05/25/2020 at Unknown time  . clopidogrel (PLAVIX) 75 MG tablet Take 75 mg by mouth daily.   05/21/2020  . guaiFENesin-dextromethorphan (ROBITUSSIN DM) 100-10 MG/5ML syrup Take 15 mLs by mouth every 4 (four) hours as needed for cough. 118 mL 0   . LEVEMIR FLEXTOUCH 100 UNIT/ML FlexPen Inject 24 Units into the skin at bedtime.   05/23/2020  . ondansetron (ZOFRAN) 4 MG tablet Take 4 mg by mouth at bedtime.     Marland Kitchen OZEMPIC, 0.25 OR 0.5 MG/DOSE, 2 MG/1.5ML SOPN Inject 0.5 mg into the skin once a week. Friday     . polyethylene glycol (MIRALAX / GLYCOLAX) 17 g packet Take 17 g by mouth daily.     Marland Kitchen senna (SENOKOT) 8.6 MG TABS tablet Take 2 tablets by mouth at bedtime.     . simethicone (MYLICON) 683 MG chewable tablet Chew 125 mg by mouth 3 (three) times daily.        Allergies  Allergen Reactions  . Bee  Pollen Hives and Swelling     Past Medical History:  Diagnosis Date  . AAA (abdominal aortic aneurysm) ([place])    a. 08/2016 CTA: 3.1cm saccular aneurysm of distal Ao arch w/ penetrating atheromatous ulcer. 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 09/2017 CTA chest/abd/pelvis [facility]): 6.3cm infrarenal AAA, unchanged bilateral CIA aneurysms (R 3.5cm, L 2.8cm)-->pt refused intervention.  . Anemia   . Blind right eye   . Carotid arterial disease ([place])    a. 12/2016 CTA Neck: RICA 100%. LICA >37%; b. 11/2829 s/p trans aortic LICA revascularization and stent placement [facility]); c. 03/2017 Carotid U/S [facility]): No significant LICA stenosis, patent stent.  Marland Kitchen COPD (chronic obstructive  pulmonary disease) ([place])   . Depression   . Diabetes mellitus without complication ([place])   . Diastolic dysfunction    a. 08/2016 Echo: EF 60%, Gr1 DD, mild MR, mildly dil LA/RV. No cardiac source of emboli.  . Femoral DVT (deep venous thrombosis) ([place])    a. 08/2016 L Fem Vein DVT ->xarelto/IVC filter. Xarelto subsequently d/c'd.  . Hemiparesis ([place])   . Hemiplegia ([place])   . History of pulmonary embolism    a. 08/2016 following CVA-->chronic xarelto, s/p IVC filter. Xarelto subsequently d/c'd.  Marland Kitchen History of right MCA stroke    a. 08/2016 MRI: R MCA infarct w/ occlusion of RICA & R MCA.   Marland Kitchen Hypertension   . Stroke ([place])   . Tobacco abuse     Review of systems:  Otherwise negative.    Physical Exam  Gen: Alert, oriented. Disheveled appearing HEENT:  PERRLA. Lungs: No respiratory distress CV: RRR Abd: soft, benign, no masses Ext: No edema.     Planned procedures: Proceed with colonoscopy. The patient understands the nature of the planned procedure, indications, risks, alternatives and potential complications including but not limited to bleeding, infection, perforation, damage to internal organs and possible oversedation/side effects from anesthesia. The patient agrees and gives consent to proceed.  Please refer to procedure notes for findings, recommendations and patient disposition/instructions.     Chad Miyamoto MD, MPH Gastroenterology 05/26/2020  8:59 AM

## 2020-05-26 NOTE — Anesthesia Procedure Notes (Signed)
Procedure Name: General with mask airway Performed by: Fletcher-Harrison, Jameil Whitmoyer, CRNA Pre-anesthesia Checklist: Patient identified, Emergency Drugs available, Suction available and Patient being monitored Patient Re-evaluated:Patient Re-evaluated prior to induction Oxygen Delivery Method: Simple face mask Induction Type: IV induction Placement Confirmation: positive ETCO2 and CO2 detector Dental Injury: Teeth and Oropharynx as per pre-operative assessment        

## 2020-05-26 NOTE — Interval H&P Note (Signed)
History and Physical Interval Note:  05/26/2020 9:14 AM  Chad Combs  has presented today for surgery, with the diagnosis of ABN IMAGING OF GI TRACT.  The various methods of treatment have been discussed with the patient and family. After consideration of risks, benefits and other options for treatment, the patient has consented to  Procedure(s): COLONOSCOPY WITH PROPOFOL (N/A) as a surgical intervention.  The patient's history has been reviewed, patient examined, no change in status, stable for surgery.  I have reviewed the patient's chart and labs.  Questions were answered to the patient's satisfaction.     Lesly Rubenstein  Ok to proceed.

## 2020-05-27 ENCOUNTER — Encounter: Payer: Self-pay | Admitting: Gastroenterology

## 2020-05-27 LAB — SURGICAL PATHOLOGY

## 2020-05-27 NOTE — Anesthesia Postprocedure Evaluation (Signed)
Anesthesia Post Note  Patient: Chad Combs  Procedure(s) Performed: COLONOSCOPY WITH PROPOFOL (N/A )  Patient location during evaluation: Endoscopy Anesthesia Type: General Level of consciousness: awake and alert and oriented Pain management: pain level controlled Vital Signs Assessment: post-procedure vital signs reviewed and stable Respiratory status: spontaneous breathing Cardiovascular status: blood pressure returned to baseline Anesthetic complications: no   No complications documented.   Last Vitals:  Vitals:   05/26/20 1006 05/26/20 1016  BP: 122/84 (!) 152/88  Pulse: 60 (!) 57  Resp: 12 14  Temp:    SpO2: 94% 95%    Last Pain:  Vitals:   05/26/20 1016  TempSrc:   PainSc: 0-No pain                 Helayna Dun

## 2020-06-02 ENCOUNTER — Emergency Department: Payer: Medicare Other

## 2020-06-02 ENCOUNTER — Other Ambulatory Visit: Payer: Self-pay

## 2020-06-02 DIAGNOSIS — R059 Cough, unspecified: Secondary | ICD-10-CM | POA: Diagnosis present

## 2020-06-02 DIAGNOSIS — I10 Essential (primary) hypertension: Secondary | ICD-10-CM | POA: Diagnosis not present

## 2020-06-02 DIAGNOSIS — Z7902 Long term (current) use of antithrombotics/antiplatelets: Secondary | ICD-10-CM | POA: Diagnosis not present

## 2020-06-02 DIAGNOSIS — Z20822 Contact with and (suspected) exposure to covid-19: Secondary | ICD-10-CM | POA: Insufficient documentation

## 2020-06-02 DIAGNOSIS — Z87891 Personal history of nicotine dependence: Secondary | ICD-10-CM | POA: Insufficient documentation

## 2020-06-02 DIAGNOSIS — Z86718 Personal history of other venous thrombosis and embolism: Secondary | ICD-10-CM | POA: Diagnosis not present

## 2020-06-02 DIAGNOSIS — Z8673 Personal history of transient ischemic attack (TIA), and cerebral infarction without residual deficits: Secondary | ICD-10-CM | POA: Diagnosis not present

## 2020-06-02 DIAGNOSIS — E119 Type 2 diabetes mellitus without complications: Secondary | ICD-10-CM | POA: Diagnosis not present

## 2020-06-02 DIAGNOSIS — Z8679 Personal history of other diseases of the circulatory system: Secondary | ICD-10-CM | POA: Insufficient documentation

## 2020-06-02 DIAGNOSIS — Z79899 Other long term (current) drug therapy: Secondary | ICD-10-CM | POA: Diagnosis not present

## 2020-06-02 DIAGNOSIS — H53132 Sudden visual loss, left eye: Secondary | ICD-10-CM | POA: Diagnosis not present

## 2020-06-02 DIAGNOSIS — J449 Chronic obstructive pulmonary disease, unspecified: Secondary | ICD-10-CM | POA: Diagnosis not present

## 2020-06-02 DIAGNOSIS — J189 Pneumonia, unspecified organism: Secondary | ICD-10-CM | POA: Diagnosis not present

## 2020-06-02 DIAGNOSIS — Z7982 Long term (current) use of aspirin: Secondary | ICD-10-CM | POA: Insufficient documentation

## 2020-06-02 LAB — RESP PANEL BY RT-PCR (FLU A&B, COVID) ARPGX2
Influenza A by PCR: NEGATIVE
Influenza B by PCR: NEGATIVE
SARS Coronavirus 2 by RT PCR: NEGATIVE

## 2020-06-02 LAB — BASIC METABOLIC PANEL
Anion gap: 10 (ref 5–15)
BUN: 17 mg/dL (ref 8–23)
CO2: 30 mmol/L (ref 22–32)
Calcium: 9.4 mg/dL (ref 8.9–10.3)
Chloride: 104 mmol/L (ref 98–111)
Creatinine, Ser: 1.21 mg/dL (ref 0.61–1.24)
GFR, Estimated: >60 mL/min (ref >60)
Glucose, Bld: 107 mg/dL — ABNORMAL HIGH (ref 70–99)
Potassium: 4.5 mmol/L (ref 3.5–5.1)
Sodium: 144 mmol/L (ref 135–145)

## 2020-06-02 LAB — CBC
HCT: 46.4 % (ref 39.0–52.0)
Hemoglobin: 15.6 g/dL (ref 13.0–17.0)
MCH: 33.1 pg (ref 26.0–34.0)
MCHC: 33.6 g/dL (ref 30.0–36.0)
MCV: 98.5 fL (ref 80.0–100.0)
Platelets: 169 10*3/uL (ref 150–400)
RBC: 4.71 MIL/uL (ref 4.22–5.81)
RDW: 12.4 % (ref 11.5–15.5)
WBC: 10.3 10*3/uL (ref 4.0–10.5)
nRBC: 0 % (ref 0.0–0.2)

## 2020-06-02 NOTE — ED Notes (Signed)
Pt requesting food and drink as well as acid reflux medication

## 2020-06-02 NOTE — ED Triage Notes (Signed)
First Nurse Note:  Arrives via [facility].  Arrives from [facility] for ED evaluation of blurred vision that has been ongoing for one week.  Left side deficit and right eye blindness is baseline.  Left eye is blurred.  VS wnl per report.

## 2020-06-02 NOTE — ED Triage Notes (Addendum)
Pt comes into the ED via EMS from [facility] with c/o cough with congestion for the past couple of weeks. Pt is blind in the right eye at baseline and now c/o having blurred vision in the left eye for the past couple of weeks.  Pt has left sided weakness from a previous stroke;

## 2020-06-03 ENCOUNTER — Emergency Department
Admission: EM | Admit: 2020-06-03 | Discharge: 2020-06-03 | Disposition: A | Discharge status: Home / Self Care | Payer: Medicare Other | Attending: Emergency Medicine | Admitting: Emergency Medicine

## 2020-06-03 DIAGNOSIS — H5462 Unqualified visual loss, left eye, normal vision right eye: Secondary | ICD-10-CM

## 2020-06-03 DIAGNOSIS — J189 Pneumonia, unspecified organism: Secondary | ICD-10-CM | POA: Diagnosis not present

## 2020-06-03 DIAGNOSIS — R0602 Shortness of breath: Secondary | ICD-10-CM

## 2020-06-03 MED ORDER — ALUM & MAG HYDROXIDE-SIMETH 200-200-20 MG/5ML PO SUSP
30.0000 mL | Freq: Once | ORAL | Status: AC
  Administered 2020-06-03: 30 mL via ORAL
  Filled 2020-06-03: qty 30

## 2020-06-03 MED ORDER — AZITHROMYCIN 250 MG PO TABS: ORAL_TABLET | ORAL | 0 refills | Status: AC

## 2020-06-03 MED ORDER — AMOXICILLIN-POT CLAVULANATE 875-125 MG PO TABS: 1.0000 | ORAL_TABLET | Freq: Two times a day (BID) | ORAL | 0 refills | Status: AC

## 2020-06-03 MED ORDER — AMOXICILLIN-POT CLAVULANATE 875-125 MG PO TABS
1.0000 | ORAL_TABLET | Freq: Once | ORAL | Status: AC
  Administered 2020-06-03: 01:00:00 1 via ORAL
  Filled 2020-06-03: qty 1

## 2020-06-03 NOTE — ED Notes (Signed)
Changed pt brief.

## 2020-06-03 NOTE — ED Notes (Signed)
Report called to [facility] to inform of return post medical discharge.

## 2020-06-03 NOTE — ED Provider Notes (Signed)
[facility] Emergency Department Provider Note   ____________________________________________   Event Date/Time   First MD Initiated Contact with Patient 06/03/20 0032     (approximate)  I have reviewed the triage vital signs and the nursing notes.   HISTORY  Chief Complaint Blurred Vision, Cough, and Shortness of Breath    HPI Chad Combs is a 64 y.o. male with past medical history of hypertension, diabetes, COPD, CVA with left hemiparesis, AAA status post repair, and right eye blindness who presents to the ED complaining of cough and congestion.  Patient reports that for the past couple of weeks he has had a productive cough with feeling of congestion in his chest.  He denies any fevers, shortness of breath, or chest pain.  He states he is vaccinated against COVID-19 and has not had any recent sick contacts.  He has not had any abdominal pain, vomiting, or diarrhea.  He additionally reports increasing blurriness in the vision from his left eye.  He denies any pain, swelling, or redness to the left eye.  He does state it is blurry at baseline but this has been gradually worsening for the past couple of months.  He states he sees an eye doctor but does not remember the name.  He denies any new numbness or weakness in his extremities.        Past Medical History:  Diagnosis Date  . AAA (abdominal aortic aneurysm) ([place])    a. 08/2016 CTA: 3.1cm saccular aneurysm of distal Ao arch w/ penetrating atheromatous ulcer. 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 09/2017 CTA chest/abd/pelvis [facility]): 6.3cm infrarenal AAA, unchanged bilateral CIA aneurysms (R 3.5cm, L 2.8cm)-->pt refused intervention.  . Anemia   . Blind right eye   . Carotid arterial disease ([place])    a. 12/2016 CTA Neck: RICA 100%. LICA >31%; b. 10/1759 s/p trans aortic LICA revascularization and stent placement [facility]); c. 03/2017  Carotid U/[facility]): No significant LICA stenosis, patent stent.  Marland Kitchen COPD (chronic obstructive pulmonary disease) ([place])   . Depression   . Diabetes mellitus without complication ([place])   . Diastolic dysfunction    a. 08/2016 Echo: EF 60%, Gr1 DD, mild MR, mildly dil LA/RV. No cardiac source of emboli.  . Femoral DVT (deep venous thrombosis) ([place])    a. 08/2016 L Fem Vein DVT ->xarelto/IVC filter. Xarelto subsequently d/c'd.  . Hemiparesis ([place])   . Hemiplegia ([place])   . History of pulmonary embolism    a. 08/2016 following CVA-->chronic xarelto, s/p IVC filter. Xarelto subsequently d/c'd.  Marland Kitchen History of right MCA stroke    a. 08/2016 MRI: R MCA infarct w/ occlusion of RICA & R MCA.   Marland Kitchen Hypertension   . Stroke ([place])   . Tobacco abuse     Patient Active Problem List   Diagnosis Date Noted  . Acute colitis 01/24/2020  . Hematochezia 01/24/2020  . Chest pain 02/17/2018  . UTI (urinary tract infection) 05/19/2017  . Seizure-like activity ([place]) 05/19/2017  . Hyperlipidemia 11/08/2016  . Type II diabetes mellitus with neurological manifestations, uncontrolled ([place]) 10/04/2016  . Type 2 diabetes mellitus with hyperlipidemia ([place]) 10/04/2016  . Abdominal aortic aneurysm (AAA) greater than 5.5 cm in diameter in male [facility])   . Acute deep vein thrombosis (DVT) of femoral vein of left lower extremity ([place])   . Pulmonary embolus ([place])   . Adjustment disorder   . Acute right MCA  stroke ([place]) 08/25/2016  . Left hemiplegia ([place])   . Dysarthria, post-stroke   . Dysphagia, post-stroke   . Carotid stenosis   . Benign essential HTN   . Leukocytosis   . Polycythemia vera ([place])   . Tobacco abuse   . Chronic obstructive pulmonary disease ([place])   . 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    Past Surgical History:  Procedure Laterality Date  . CAROTID ENDARTERECTOMY Left   . COLONOSCOPY WITH PROPOFOL N/A 05/26/2020   Procedure: COLONOSCOPY WITH PROPOFOL;  Surgeon: Lesly Rubenstein, MD;  Location: [facility] ENDOSCOPY;   Service: Endoscopy;  Laterality: N/A;  . ENDOVASCULAR REPAIR/STENT GRAFT N/A 01/29/2020   Procedure: ENDOVASCULAR REPAIR/STENT GRAFT;  Surgeon: Katha Cabal, MD;  Location: [facility];  Service: Cardiovascular;  Laterality: N/A;  . IVC FILTER INSERTION N/A 08/24/2016   Procedure: IVC Filter Insertion;  Surgeon: Katha Cabal, MD;  Location: [place] [facility];  Service: Cardiovascular;  Laterality: N/A;    Prior to Admission medications   Medication Sig Start Date End Date Taking? Authorizing Provider  amoxicillin-clavulanate (AUGMENTIN) 875-125 MG tablet Take 1 tablet by mouth 2 (two) times daily for 7 days. 06/03/20 06/10/20  Blake Divine, MD  aspirin 81 MG chewable tablet Chew 81 mg by mouth daily.    [provider]  atorvastatin (LIPITOR) 20 MG tablet Take 20 mg by mouth daily. 01/24/20   [provider]  azithromycin (ZITHROMAX Z-PAK) 250 MG tablet Take 2 tablets (500 mg) on  Day 1,  followed by 1 tablet (250 mg) once daily on Days 2 through 5. 06/03/20 06/08/20  Blake Divine, MD  budesonide-formoterol (SYMBICORT) 80-4.5 MCG/ACT inhaler Inhale 2 puffs into the lungs every 12 (twelve) hours.    [provider]  clopidogrel (PLAVIX) 75 MG tablet Take 75 mg by mouth daily.    [provider]  divalproex (DEPAKOTE) 125 MG DR tablet Take 125 mg by mouth at bedtime. 01/24/20   [provider]  docusate sodium (COLACE) 100 MG capsule Take 1 capsule (100 mg total) by mouth daily. 01/31/20   Swayze, Ava, DO  ezetimibe (ZETIA) 10 MG tablet Take 1 tablet (10 mg total) by mouth daily at 6 PM. Patient taking differently: Take 10 mg by mouth daily.  10/01/16   Love, Ivan Anchors, PA-C  gabapentin (NEURONTIN) 300 MG capsule Take 300 mg by mouth daily. 01/24/20   [provider]  guaiFENesin-dextromethorphan (ROBITUSSIN DM) 100-10 MG/5ML syrup Take 15 mLs by mouth every 4 (four) hours as needed for cough. 01/30/20   Swayze, Ava, DO  LEVEMIR  FLEXTOUCH 100 UNIT/ML FlexPen Inject 24 Units into the skin at bedtime. 01/21/20   [provider]  levETIRAcetam (KEPPRA) 250 MG tablet Take 250 mg by mouth 2 (two) times daily. 01/24/20   [provider]  lisinopril (ZESTRIL) 40 MG tablet Take 40 mg by mouth daily. 01/24/20   [provider]  metoprolol tartrate (LOPRESSOR) 25 MG tablet Take 0.5 tablets (12.5 mg total) by mouth 2 (two) times daily. 10/06/16   Hendricks Limes, MD  ondansetron (ZOFRAN) 4 MG tablet Take 4 mg by mouth at bedtime. 01/22/20   [provider]  OZEMPIC, 0.25 OR 0.5 MG/DOSE, 2 MG/1.5ML SOPN Inject 0.5 mg into the skin once a week. Friday 01/04/20   [provider]  pantoprazole (PROTONIX) 40 MG tablet Take 40 mg by mouth 2 (two) times daily.  01/24/20   [provider]  polyethylene glycol (MIRALAX / Floria Raveling)  17 g packet Take 17 g by mouth daily.    [provider]  potassium chloride SA (KLOR-CON) 20 MEQ tablet Take 20 mEq by mouth 2 (two) times daily. 01/23/20   [provider]  senna (SENOKOT) 8.6 MG TABS tablet Take 2 tablets by mouth at bedtime.    [provider]  sertraline (ZOLOFT) 100 MG tablet Take 100 mg by mouth daily. 01/24/20   [provider]  simethicone (MYLICON) 482 MG chewable tablet Chew 125 mg by mouth 3 (three) times daily.    [provider]  tamsulosin (FLOMAX) 0.4 MG CAPS capsule Take 1 capsule (0.4 mg total) by mouth daily after supper. 10/01/16   Love, Ivan Anchors, PA-C    Allergies Bee pollen  Family History  Problem Relation Age of Onset  . Cancer Mother   . Heart disease Father     Social History Social History   Tobacco Use  . Smoking status: Former Smoker    Packs/day: 1.50    Years: 45.00    Pack years: 67.50    Types: Cigarettes    Quit date: 08/30/2016    Years since quitting: 3.7  . Smokeless tobacco: Never Used  Vaping Use  . Vaping Use: Never used  Substance Use Topics  . Alcohol use:  Yes    Alcohol/week: 0.0 standard drinks    Comment: occasional (once every couple months)  . Drug use: No    Review of Systems  Constitutional: No fever/chills Eyes: Positive for blurry vision. ENT: No sore throat. Cardiovascular: Denies chest pain. Respiratory: Denies shortness of breath.  Positive for cough and congestion. Gastrointestinal: No abdominal pain.  No nausea, no vomiting.  No diarrhea.  No constipation. Genitourinary: Negative for dysuria. Musculoskeletal: Negative for back pain. Skin: Negative for rash. Neurological: Negative for headaches, focal weakness or numbness.  ____________________________________________   PHYSICAL EXAM:  VITAL SIGNS: ED Triage Vitals  Enc Vitals Group     BP 06/02/20 1812 (!) 148/79     Pulse Rate 06/02/20 1812 69     Resp 06/02/20 1812 16     Temp 06/02/20 1812 98 F (36.7 C)     Temp Source 06/02/20 1812 Oral     SpO2 06/02/20 1812 91 %     Weight 06/02/20 1815 182 lb (82.6 kg)     Height 06/02/20 1815 5\' 9"  (1.753 m)     Head Circumference --      Peak Flow --      Pain Score 06/02/20 1815 6     Pain Loc --      Pain Edu? --      Excl. in [place]? --     Constitutional: Alert and oriented. Eyes: Conjunctivae are normal.  Pupils equal round and reactive to light bilaterally, extraocular movements intact. Head: Atraumatic. Nose: No congestion/rhinnorhea. Mouth/Throat: Mucous membranes are moist. Neck: Normal ROM Cardiovascular: Normal rate, regular rhythm. Grossly normal heart sounds. Respiratory: Normal respiratory effort.  No retractions. Lungs CTAB. Gastrointestinal: Soft and nontender. No distention. Genitourinary: deferred Musculoskeletal: No lower extremity tenderness nor edema. Neurologic:  Normal speech and language.  Left hemiparesis noted, at patient's reported baseline.  5 out of 5 strength in right upper and lower extremities. Skin:  Skin is warm, dry and intact. No rash noted. Psychiatric: Mood and affect are  normal. Speech and behavior are normal.  ____________________________________________   LABS (all labs ordered are listed, but only abnormal results are displayed)  Labs Reviewed  BASIC METABOLIC PANEL -  Abnormal; Notable for the following components:      Result Value   Glucose, Bld 107 (*)    All other components within normal limits  RESP PANEL BY RT-PCR (FLU A&B, COVID) ARPGX2  CBC    PROCEDURES  Procedure(s) performed (including Critical Care):  Procedures  ED ECG REPORT I, Blake Divine, the attending physician, personally viewed and interpreted this ECG.   Date: 06/03/2020  EKG Time: 18:21  Rate: 69  Rhythm: normal sinus rhythm  Axis: Normal  Intervals:first-degree A-V block   ST&T Change: None  ____________________________________________   INITIAL IMPRESSION / ASSESSMENT AND PLAN / ED COURSE       64 year old male with possible history of hypertension, diabetes, COPD, stroke with left hemiparesis, AAA status post endovascular repair, and right eye blindness who presents to the ED complaining of cough and congestion for the past month with worsening blurry vision in his left eye for the past couple of months.  Chest x-ray reviewed by me and shows right-sided infiltrates concerning for pneumonia.  COVID-19 testing is negative, labs are reassuring and patient is not in any respiratory distress, maintaining O2 sats on room air.  He would be a candidate for outpatient treatment for this pneumonia and we will give initial dose of Augmentin, plan to treat with Augmentin and azithromycin.  He reports some left eye blurry vision for the past few months, left eye is normal and appearance with no signs of infection.  We will check visual acuity, no new right-sided deficits to suggest association of his visual changes with stroke.  Patient unable to read off a visual acuity card at all using his left eye.  He is also unable to distinguish different numbers of fingers being  held up in front of him.  He is able to distinguish different colors of light with tonometry but little more than this.  Intraocular pressures are 9 on the left and 10 on the right, pupil reactive to light on the left.  Case discussed with Dr. [place] Ina of ophthalmology, who states that no further work-up indicated here in the ED and given chronicity of his symptoms, patient may follow-up in the ophthalmology clinic tomorrow or the next day.  Patient was advised to schedule follow-up, but otherwise he is appropriate for discharge home with treatment for community-acquired pneumonia.  Patient agrees with plan.      ____________________________________________   FINAL CLINICAL IMPRESSION(S) / ED DIAGNOSES  Final diagnoses:  Community acquired pneumonia of right lung, unspecified part of lung  Vision loss of left eye     ED Discharge Orders         Ordered    amoxicillin-clavulanate (AUGMENTIN) 875-125 MG tablet  2 times daily        06/03/20 0240    azithromycin (ZITHROMAX Z-PAK) 250 MG tablet        06/03/20 0240           Note:  This document was prepared using Dragon voice recognition software and may include unintentional dictation errors.   Blake Divine, MD 06/03/20 [phone]

## 2020-07-22 ENCOUNTER — Encounter: Payer: Self-pay | Admitting: Ophthalmology

## 2020-07-22 ENCOUNTER — Other Ambulatory Visit: Payer: Self-pay

## 2020-07-25 ENCOUNTER — Other Ambulatory Visit
Admission: RE | Admit: 2020-07-25 | Discharge: 2020-07-25 | Disposition: A | Discharge status: Home / Self Care | Payer: Medicare Other | Source: Ambulatory Visit | Attending: Ophthalmology | Admitting: Ophthalmology

## 2020-07-25 ENCOUNTER — Other Ambulatory Visit: Payer: Self-pay

## 2020-07-25 DIAGNOSIS — Z01812 Encounter for preprocedural laboratory examination: Secondary | ICD-10-CM | POA: Insufficient documentation

## 2020-07-25 DIAGNOSIS — Z20822 Contact with and (suspected) exposure to covid-19: Secondary | ICD-10-CM | POA: Insufficient documentation

## 2020-07-25 LAB — SARS CORONAVIRUS 2 (TAT 6-24 HRS): SARS Coronavirus 2: NEGATIVE

## 2020-07-29 ENCOUNTER — Other Ambulatory Visit: Payer: Self-pay

## 2020-07-29 ENCOUNTER — Encounter
Admission: RE | Disposition: A | Discharge status: Home / Self Care | Payer: Self-pay | Source: Home / Self Care | Attending: Ophthalmology

## 2020-07-29 ENCOUNTER — Ambulatory Visit: Payer: Medicare Other | Admitting: Anesthesiology

## 2020-07-29 ENCOUNTER — Encounter: Payer: Self-pay | Admitting: Ophthalmology

## 2020-07-29 ENCOUNTER — Ambulatory Visit
Admission: RE | Admit: 2020-07-29 | Discharge: 2020-07-29 | Disposition: A | Discharge status: Home / Self Care | Payer: Medicare Other | Attending: Ophthalmology | Admitting: Ophthalmology

## 2020-07-29 DIAGNOSIS — Z86718 Personal history of other venous thrombosis and embolism: Secondary | ICD-10-CM | POA: Diagnosis not present

## 2020-07-29 DIAGNOSIS — Z86711 Personal history of pulmonary embolism: Secondary | ICD-10-CM | POA: Diagnosis not present

## 2020-07-29 DIAGNOSIS — I2510 Atherosclerotic heart disease of native coronary artery without angina pectoris: Secondary | ICD-10-CM | POA: Diagnosis not present

## 2020-07-29 DIAGNOSIS — H5461 Unqualified visual loss, right eye, normal vision left eye: Secondary | ICD-10-CM | POA: Diagnosis not present

## 2020-07-29 DIAGNOSIS — Z7982 Long term (current) use of aspirin: Secondary | ICD-10-CM | POA: Diagnosis not present

## 2020-07-29 DIAGNOSIS — Z794 Long term (current) use of insulin: Secondary | ICD-10-CM | POA: Diagnosis not present

## 2020-07-29 DIAGNOSIS — Z87891 Personal history of nicotine dependence: Secondary | ICD-10-CM | POA: Diagnosis not present

## 2020-07-29 DIAGNOSIS — J449 Chronic obstructive pulmonary disease, unspecified: Secondary | ICD-10-CM | POA: Diagnosis not present

## 2020-07-29 DIAGNOSIS — Z7901 Long term (current) use of anticoagulants: Secondary | ICD-10-CM | POA: Insufficient documentation

## 2020-07-29 DIAGNOSIS — Z8249 Family history of ischemic heart disease and other diseases of the circulatory system: Secondary | ICD-10-CM | POA: Diagnosis not present

## 2020-07-29 DIAGNOSIS — Z91030 Bee allergy status: Secondary | ICD-10-CM | POA: Diagnosis not present

## 2020-07-29 DIAGNOSIS — Z7951 Long term (current) use of inhaled steroids: Secondary | ICD-10-CM | POA: Insufficient documentation

## 2020-07-29 DIAGNOSIS — E1136 Type 2 diabetes mellitus with diabetic cataract: Secondary | ICD-10-CM | POA: Diagnosis not present

## 2020-07-29 DIAGNOSIS — H2512 Age-related nuclear cataract, left eye: Secondary | ICD-10-CM | POA: Insufficient documentation

## 2020-07-29 DIAGNOSIS — I69359 Hemiplegia and hemiparesis following cerebral infarction affecting unspecified side: Secondary | ICD-10-CM | POA: Diagnosis not present

## 2020-07-29 HISTORY — PX: CATARACT EXTRACTION W/PHACO: SHX586

## 2020-07-29 LAB — GLUCOSE, CAPILLARY
Glucose-Capillary: 149 mg/dL — ABNORMAL HIGH (ref 70–99)
Glucose-Capillary: 129 mg/dL — ABNORMAL HIGH (ref 70–99)

## 2020-07-29 SURGERY — PHACOEMULSIFICATION, CATARACT, WITH IOL INSERTION
Anesthesia: Monitor Anesthesia Care | Site: Eye | Laterality: Left

## 2020-07-29 MED ORDER — MOXIFLOXACIN HCL 0.5 % OP SOLN
OPHTHALMIC | Status: DC | PRN
  Administered 2020-07-29: 0.2 mL via OPHTHALMIC

## 2020-07-29 MED ORDER — NA CHONDROIT SULF-NA HYALURON 40-17 MG/ML IO SOLN
INTRAOCULAR | Status: DC | PRN
  Administered 2020-07-29: 1 mL via INTRAOCULAR

## 2020-07-29 MED ORDER — LIDOCAINE HCL (PF) 2 % IJ SOLN
INTRAOCULAR | Status: DC | PRN
  Administered 2020-07-29: 1 mL

## 2020-07-29 MED ORDER — TETRACAINE HCL 0.5 % OP SOLN
1.0000 [drp] | OPHTHALMIC | Status: DC | PRN
  Administered 2020-07-29 (×3): 1 [drp] via OPHTHALMIC

## 2020-07-29 MED ORDER — EPINEPHRINE PF 1 MG/ML IJ SOLN
INTRAOCULAR | Status: DC | PRN
  Administered 2020-07-29: 48 mL via OPHTHALMIC

## 2020-07-29 MED ORDER — TRYPAN BLUE 0.06 % OP SOLN
OPHTHALMIC | Status: DC | PRN
  Administered 2020-07-29: 0.5 mL via INTRAOCULAR

## 2020-07-29 MED ORDER — BRIMONIDINE TARTRATE-TIMOLOL 0.2-0.5 % OP SOLN
OPHTHALMIC | Status: DC | PRN
  Administered 2020-07-29: 1 [drp] via OPHTHALMIC

## 2020-07-29 MED ORDER — ARMC OPHTHALMIC DILATING DROPS
1.0000 "application " | OPHTHALMIC | Status: DC | PRN
  Administered 2020-07-29 (×3): 1 via OPHTHALMIC

## 2020-07-29 SURGICAL SUPPLY — 21 items
CANNULA ANT/CHMB 27G (MISCELLANEOUS) ×2 IMPLANT
CANNULA ANT/CHMB 27GA (MISCELLANEOUS) ×6 IMPLANT
DEVICE MILOOP (MISCELLANEOUS) IMPLANT
GLOVE SURG LX 8.0 MICRO (GLOVE) ×1
GLOVE SURG LX STRL 8.0 MICRO (GLOVE) ×1 IMPLANT
GLOVE SURG TRIUMPH 8.0 PF LTX (GLOVE) ×2 IMPLANT
GOWN STRL REUS W/ TWL LRG LVL3 (GOWN DISPOSABLE) ×2 IMPLANT
GOWN STRL REUS W/TWL LRG LVL3 (GOWN DISPOSABLE) ×4
LENS IOL TECNIS EYHANCE 20.5 (Intraocular Lens) ×1 IMPLANT
MARKER SKIN DUAL TIP RULER LAB (MISCELLANEOUS) ×2 IMPLANT
MILOOP DEVICE (MISCELLANEOUS) ×2
NDL FILTER BLUNT 18X1 1/2 (NEEDLE) ×1 IMPLANT
NEEDLE FILTER BLUNT 18X 1/2SAF (NEEDLE) ×1
NEEDLE FILTER BLUNT 18X1 1/2 (NEEDLE) ×1 IMPLANT
PACK EYE AFTER SURG (MISCELLANEOUS) ×2 IMPLANT
PACK OPTHALMIC (MISCELLANEOUS) ×2 IMPLANT
PACK PORFILIO (MISCELLANEOUS) ×2 IMPLANT
SYR 3ML LL SCALE MARK (SYRINGE) ×2 IMPLANT
SYR TB 1ML LUER SLIP (SYRINGE) ×2 IMPLANT
WATER STERILE IRR 250ML POUR (IV SOLUTION) ×2 IMPLANT
WIPE NON LINTING 3.25X3.25 (MISCELLANEOUS) ×2 IMPLANT

## 2020-07-29 NOTE — Discharge Instructions (Signed)
General Anesthesia, Adult, Care After °This sheet gives you information about how to care for yourself after your procedure. Your health care provider may also give you more specific instructions. If you have problems or questions, contact your health care provider. °What can I expect after the procedure? °After the procedure, the following side effects are common: °· Pain or discomfort at the IV site. °· Nausea. °· Vomiting. °· Sore throat. °· Trouble concentrating. °· Feeling cold or chills. °· Feeling weak or tired. °· Sleepiness and fatigue. °· Soreness and body aches. These side effects can affect parts of the body that were not involved in surgery. °Follow these instructions at home: °For the time period you were told by your health care provider: °· Rest. °· Do not participate in activities where you could fall or become injured. °· Do not drive or use machinery. °· Do not drink alcohol. °· Do not take sleeping pills or medicines that cause drowsiness. °· Do not make important decisions or sign legal documents. °· Do not take care of children on your own.   °Eating and drinking °· Follow any instructions from your health care provider about eating or drinking restrictions. °· When you feel hungry, start by eating small amounts of foods that are soft and easy to digest (bland), such as toast. Gradually return to your regular diet. °· Drink enough fluid to keep your urine pale yellow. °· If you vomit, rehydrate by drinking water, juice, or clear broth. °General instructions °· If you have sleep apnea, surgery and certain medicines can increase your risk for breathing problems. Follow instructions from your health care provider about wearing your sleep device: °? Anytime you are sleeping, including during daytime naps. °? While taking prescription pain medicines, sleeping medicines, or medicines that make you drowsy. °· Have a responsible adult stay with you for the time you are told. It is important to have  someone help care for you until you are awake and alert. °· Return to your normal activities as told by your health care provider. Ask your health care provider what activities are safe for you. °· Take over-the-counter and prescription medicines only as told by your health care provider. °· If you smoke, do not smoke without supervision. °· Keep all follow-up visits as told by your health care provider. This is important. °Contact a health care provider if: °· You have nausea or vomiting that does not get better with medicine. °· You cannot eat or drink without vomiting. °· You have pain that does not get better with medicine. °· You are unable to pass urine. °· You develop a skin rash. °· You have a fever. °· You have redness around your IV site that gets worse. °Get help right away if: °· You have difficulty breathing. °· You have chest pain. °· You have blood in your urine or stool, or you vomit blood. °Summary °· After the procedure, it is common to have a sore throat or nausea. It is also common to feel tired. °· Have a responsible adult stay with you for the time you are told. It is important to have someone help care for you until you are awake and alert. °· When you feel hungry, start by eating small amounts of foods that are soft and easy to digest (bland), such as toast. Gradually return to your regular diet. °· Drink enough fluid to keep your urine pale yellow. °· Return to your normal activities as told by your health care provider.   Ask your health care provider what activities are safe for you. °This information is not intended to replace advice given to you by your health care provider. Make sure you discuss any questions you have with your health care provider. °Document Revised: 02/21/2020 Document Reviewed: 09/20/2019 °Elsevier Patient Education © 2021 [employer] ° °Cataract Surgery, Care After °This sheet gives you information about how to care for yourself after your procedure. Your health  care provider may also give you more specific instructions. If you have problems or questions, contact your health care provider. °What can I expect after the procedure? °After the procedure, it is common to have: °· Itching. °· Discomfort. °· Fluid discharge. °· Sensitivity to light and to touch. °· Bruising in or around the eye. °· Mild blurred vision. °Follow these instructions at home: °Eye care °· Do not touch or rub your eyes. °· Protect your eyes as told by your health care provider. You may be told to wear a protective eye shield or sunglasses. °· Do not put a contact lens into the affected eye or eyes until your health care provider approves. °· Keep the area around your eye clean and dry: °? Avoid swimming. °? Do not allow water to hit you directly in the face while showering. °? Keep soap and shampoo out of your eyes. °· Check your eye every day for signs of infection. Watch for: °? Redness, swelling, or pain. °? Fluid, blood, or pus. °? Warmth. °? A bad smell. °? Vision that is getting worse. °? Sensitivity that is getting worse.   °Activity °· Do not drive for 24 hours if you were given a sedative during your procedure. °· Avoid strenuous activities, such as playing contact sports, for as long as told by your health care provider. °· Do not drive or use heavy machinery until your health care provider approves. °· Do not bend or lift heavy objects. Bending increases pressure in the eye. You can walk, climb stairs, and do light household chores. °· Ask your health care provider when you can return to work. If you work in a dusty environment, you may be advised to wear protective eyewear for a period of time. °General instructions °· Take or apply over-the-counter and prescription medicines only as told by your health care provider. This includes eye drops. °· Keep all follow-up visits as told by your health care provider. This is important. °Contact a health care provider if: °· You have increased  bruising around your eye. °· You have pain that is not helped with medicine. °· You have a fever. °· You have redness, swelling, or pain in your eye. °· You have fluid, blood, or pus coming from your incision. °· Your vision gets worse. °· Your sensitivity to light gets worse. °Get help right away if: °· You have sudden loss of vision. °· You see flashes of light or spots (floaters). °· You have severe eye pain. °· You develop nausea or vomiting. °Summary °· After your procedure, it is common to have itching, discomfort, bruising, fluid discharge, or sensitivity to light. °· Follow instructions from your health care provider about caring for your eye after the procedure. °· Do not rub your eye after the procedure. You may need to wear eye protection or sunglasses. Do not wear contact lenses. Keep the area around your eye clean and dry. °· Avoid activities that require a lot of effort. These include playing sports and lifting heavy objects. °· Contact a health care provider if   you have increased bruising, pain that does not go away, or a fever. Get help right away if you suddenly lose your vision, see flashes of light or spots, or have severe pain in the eye. °This information is not intended to replace advice given to you by your health care provider. Make sure you discuss any questions you have with your health care provider. °Document Revised: 04/03/2019 Document Reviewed: 12/05/2017 °Elsevier Patient Education © 2021 Elsevier Inc. ° °

## 2020-07-29 NOTE — Transfer of Care (Signed)
Immediate Anesthesia Transfer of Care Note  Patient: Chad Combs  Procedure(s) Performed: CATARACT EXTRACTION PHACO AND INTRAOCULAR LENS PLACEMENT (IOC) LEFT VISION BLUE 9.69 00:54.3 (Left Eye)  Patient Location: PACU  Anesthesia Type: MAC  Level of Consciousness: awake, alert  and patient cooperative  Airway and Oxygen Therapy: Patient Spontanous Breathing and Patient connected to supplemental oxygen  Post-op Assessment: Post-op Vital signs reviewed, Patient's Cardiovascular Status Stable, Respiratory Function Stable, Patent Airway and No signs of Nausea or vomiting  Post-op Vital Signs: Reviewed and stable  Complications: No complications documented.

## 2020-07-29 NOTE — Anesthesia Procedure Notes (Signed)
Procedure Name: MAC Date/Time: 07/29/2020 10:41 AM Performed by: Jeannene Patella, CRNA Pre-anesthesia Checklist: Patient identified, Emergency Drugs available, Suction available, Timeout performed and Patient being monitored Patient Re-evaluated:Patient Re-evaluated prior to induction Oxygen Delivery Method: Nasal cannula Placement Confirmation: positive ETCO2

## 2020-07-29 NOTE — Anesthesia Preprocedure Evaluation (Signed)
Anesthesia Evaluation  Patient identified by MRN, date of birth, ID band Patient awake    Reviewed: Allergy & Precautions, H&P , NPO status , Patient's Chart, lab work & pertinent test results  Airway Mallampati: I  TM Distance: >3 FB Neck ROM: full    Dental no notable dental hx.    Pulmonary COPD, former smoker,    Pulmonary exam normal        Cardiovascular hypertension, On Medications Normal cardiovascular exam+ Valvular Problems/Murmurs  Rhythm:regular Rate:Normal     Neuro/Psych CVA, Residual Symptoms    GI/Hepatic   Endo/Other  diabetes, Well Controlled, Type 2  Renal/GU      Musculoskeletal   Abdominal   Peds  Hematology   Anesthesia Other Findings   Reproductive/Obstetrics                             Anesthesia Physical Anesthesia Plan  ASA: IV  Anesthesia Plan: MAC   Post-op Pain Management:    Induction:   PONV Risk Score and Plan: Treatment may vary due to age or medical condition  Airway Management Planned:   Additional Equipment:   Intra-op Plan:   Post-operative Plan:   Informed Consent: I have reviewed the patients History and Physical, chart, labs and discussed the procedure including the risks, benefits and alternatives for the proposed anesthesia with the patient or authorized representative who has indicated his/her understanding and acceptance.       Plan Discussed with:   Anesthesia Plan Comments:         Anesthesia Quick Evaluation

## 2020-07-29 NOTE — H&P (Signed)
[place]   Primary Care Physician:  Alvester Morin, MD Ophthalmologist: Dr. George Ina  Pre-Procedure History & Physical: HPI:  Chad Combs is a 65 y.o. male here for cataract surgery.   Past Medical History:  Diagnosis Date  . AAA (abdominal aortic aneurysm) ([place])    a. 08/2016 CTA: 3.1cm saccular aneurysm of distal Ao arch w/ penetrating atheromatous ulcer. 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 09/2017 CTA chest/abd/pelvis [facility]): 6.3cm infrarenal AAA, unchanged bilateral CIA aneurysms (R 3.5cm, L 2.8cm)-->pt refused intervention.  . Anemia   . Blind right eye   . Blind right eye   . Carotid arterial disease ([place])    a. 12/2016 CTA Neck: RICA 100%. LICA >17%; b. 09/9447 s/p trans aortic LICA revascularization and stent placement [facility]); c. 03/2017 Carotid U/S [facility]): No significant LICA stenosis, patent stent.  Marland Kitchen COPD (chronic obstructive pulmonary disease) ([place])   . Depression   . Diabetes mellitus without complication ([place])   . Diastolic dysfunction    a. 08/2016 Echo: EF 60%, Gr1 DD, mild MR, mildly dil LA/RV. No cardiac source of emboli.  . Femoral DVT (deep venous thrombosis) ([place])    a. 08/2016 L Fem Vein DVT ->xarelto/IVC filter. Xarelto subsequently d/c'd.  . Hemiparesis ([place])   . Hemiplegia ([place]) left  . History of pulmonary embolism    a. 08/2016 following CVA-->chronic xarelto, s/p IVC filter. Xarelto subsequently d/c'd.  Marland Kitchen History of right MCA stroke    a. 08/2016 MRI: R MCA infarct w/ occlusion of RICA & R MCA.   Marland Kitchen Hypertension   . Stroke ([place])   . Tobacco abuse     Past Surgical History:  Procedure Laterality Date  . CAROTID ENDARTERECTOMY Left   . COLONOSCOPY WITH PROPOFOL N/A 05/26/2020   Procedure: COLONOSCOPY WITH PROPOFOL;  Surgeon: Lesly Rubenstein, MD;  Location: [facility] ENDOSCOPY;  Service: Endoscopy;  Laterality: N/A;  . ENDOVASCULAR REPAIR/STENT GRAFT N/A 01/29/2020    Procedure: ENDOVASCULAR REPAIR/STENT GRAFT;  Surgeon: Katha Cabal, MD;  Location: [facility];  Service: Cardiovascular;  Laterality: N/A;  . IVC FILTER INSERTION N/A 08/24/2016   Procedure: IVC Filter Insertion;  Surgeon: Katha Cabal, MD;  Location: [facility];  Service: Cardiovascular;  Laterality: N/A;    Prior to Admission medications   Medication Sig Start Date End Date Taking? Authorizing Provider  acetaminophen (ACETAMINOPHEN 8 HOUR) 650 MG CR tablet Take 650 mg by mouth 3 (three) times daily.   Yes [provider]  amLODipine (NORVASC) 5 MG tablet Take 5 mg by mouth daily.   Yes [provider]  aspirin 81 MG chewable tablet Chew 81 mg by mouth daily.   Yes [provider]  atorvastatin (LIPITOR) 20 MG tablet Take 20 mg by mouth daily. 01/24/20  Yes [provider]  budesonide-formoterol (SYMBICORT) 80-4.5 MCG/ACT inhaler Inhale 2 puffs into the lungs every 12 (twelve) hours.   Yes [provider]  clopidogrel (PLAVIX) 75 MG tablet Take 75 mg by mouth daily.   Yes [provider]  divalproex (DEPAKOTE) 125 MG DR tablet Take 125 mg by mouth at bedtime. 01/24/20  Yes [provider]  docusate sodium (COLACE) 100 MG capsule Take 1 capsule (100 mg total) by mouth daily. 01/31/20  Yes Swayze, Ava, DO  Dulaglutide (TRULICITY) 1.5 QP/5.9FM SOPN Inject 1.5 mg into the skin once a week. Friday   Yes [provider]  ezetimibe (ZETIA) 10 MG tablet Take 1 tablet (10 mg total) by mouth daily at 6 PM. Patient taking differently: Take 10 mg by mouth daily. 10/01/16  Yes Love, Ivan Anchors, PA-C  gabapentin (NEURONTIN) 300 MG capsule Take 300 mg by mouth daily. 01/24/20  Yes [provider]  guaiFENesin-dextromethorphan (ROBITUSSIN DM) 100-10 MG/5ML syrup Take 15 mLs by mouth every 4 (four) hours as needed for cough. 01/30/20  Yes Swayze, Ava, DO  LEVEMIR FLEXTOUCH 100 UNIT/ML FlexPen Inject 18 Units into  the skin at bedtime. 01/21/20  Yes [provider]  levETIRAcetam (KEPPRA) 250 MG tablet Take 250 mg by mouth 2 (two) times daily. 01/24/20  Yes [provider]  lisinopril (ZESTRIL) 40 MG tablet Take 40 mg by mouth daily. 01/24/20  Yes [provider]  melatonin 3 MG TABS tablet Take 3 mg by mouth at bedtime.   Yes [provider]  metoprolol tartrate (LOPRESSOR) 25 MG tablet Take 0.5 tablets (12.5 mg total) by mouth 2 (two) times daily. 10/06/16  Yes Hendricks Limes, MD  ondansetron (ZOFRAN) 4 MG tablet Take 4 mg by mouth at bedtime. 01/22/20  Yes [provider]  pantoprazole (PROTONIX) 40 MG tablet Take 40 mg by mouth 2 (two) times daily.  01/24/20  Yes [provider]  polyethylene glycol (MIRALAX / GLYCOLAX) 17 g packet Take 17 g by mouth daily.   Yes [provider]  potassium chloride SA (KLOR-CON) 20 MEQ tablet Take 20 mEq by mouth 2 (two) times daily. 01/23/20  Yes [provider]  senna (SENOKOT) 8.6 MG TABS tablet Take 2 tablets by mouth at bedtime.   Yes [provider]  sertraline (ZOLOFT) 100 MG tablet Take 100 mg by mouth daily. 01/24/20  Yes [provider]  simethicone (MYLICON) 235 MG chewable tablet Chew 125 mg by mouth 3 (three) times daily.   Yes [provider]  tamsulosin (FLOMAX) 0.4 MG CAPS capsule Take 1 capsule (0.4 mg total) by mouth daily after supper. 10/01/16  Yes Love, Ivan Anchors, PA-C  OZEMPIC, 0.25 OR 0.5 MG/DOSE, 2 MG/1.5ML SOPN Inject 0.5 mg into the skin once a week. Friday Patient not taking: Reported on 07/22/2020 01/04/20   [provider]    Allergies as of 06/26/2020 - Review Complete 06/02/2020  Allergen Reaction Noted  . Bee pollen Hives and Swelling 01/24/2020    Family History  Problem Relation Age of Onset  . Cancer Mother   . Heart disease Father     Social History   Socioeconomic History  . Marital status: Widowed    Spouse name: Not on file  .  Number of children: Not on file  . Years of education: Not on file  . Highest education level: Not on file  Occupational History  . Occupation: Retired Administrator  Tobacco Use  . Smoking status: Former Smoker    Packs/day: 1.50    Years: 45.00    Pack years: 67.50    Types: Cigarettes    Quit date: 08/30/2016    Years since quitting: 3.9  . Smokeless tobacco: Never Used  Vaping Use  . Vaping Use: Never used  Substance and Sexual Activity  . Alcohol use: Yes    Alcohol/week: 0.0 standard drinks    Comment: occasional (once every couple months)  . Drug use: No  . Sexual activity: Never  Other Topics Concern  . Not on file  Social History Narrative   From [place].  Lives in [place] @  [facility] since his CVA in 2018.  L hemiparesis --> w/c bound.   Social Determinants of Health   Financial Resource Strain: Not on file  Food Insecurity: Not on file  Transportation Needs: Not on file  Physical Activity: Not on file  Stress: Not on file  Social Connections: Not on file  Intimate Partner Violence: Not on file    Review of Systems: See HPI, otherwise negative ROS  Physical Exam: BP (!) 84/52   Pulse (!) 103   Temp 98.2 F (36.8 C) (Temporal)   Resp 18   Ht 5\' 9"  (1.753 m)   Wt 82.1 kg   SpO2 97%   BMI 26.73 kg/m  General:   Alert,  pleasant and cooperative in NAD Head:  Normocephalic and atraumatic. Respiratory:  Normal work of breathing.  Impression/Plan: Chad Combs is here for cataract surgery.  Risks, benefits, limitations, and alternatives regarding cataract surgery have been reviewed with the patient.  Questions have been answered.  All parties agreeable.   Birder Robson, MD  07/29/2020, 10:31 AM

## 2020-07-29 NOTE — Anesthesia Postprocedure Evaluation (Signed)
Anesthesia Post Note  Patient: Chad Combs  Procedure(s) Performed: CATARACT EXTRACTION PHACO AND INTRAOCULAR LENS PLACEMENT (IOC) LEFT VISION BLUE 9.69 00:54.3 (Left Eye)     Patient location during evaluation: PACU Anesthesia Type: MAC Level of consciousness: awake and alert Pain management: pain level controlled Vital Signs Assessment: post-procedure vital signs reviewed and stable Respiratory status: spontaneous breathing Cardiovascular status: stable Anesthetic complications: no   No complications documented.  Gillian Scarce

## 2020-07-29 NOTE — Progress Notes (Signed)
No risk at this time. 

## 2020-07-29 NOTE — Op Note (Signed)
PREOPERATIVE DIAGNOSIS:  Nuclear sclerotic cataract of the left eye.   POSTOPERATIVE DIAGNOSIS:  Nuclear sclerotic cataract of the left eye.   OPERATIVE PROCEDURE:@   SURGEON:  Birder Robson, MD.   ANESTHESIA:  Anesthesiologist: Elgie Collard, MD CRNA: Jeannene Patella, CRNA  1.      Managed anesthesia care. 2.     0.93ml of Shugarcaine was instilled following the paracentesis   COMPLICATIONS: Vision Blue was used to stain the anterior capsule due to very poor/ no visualization of the red reflex.   The MiLoop device was used to divide the lens into 4 quadrants.     TECHNIQUE:   Stop and chop   DESCRIPTION OF PROCEDURE:  The patient was examined and consented in the preoperative holding area where the aforementioned topical anesthesia was applied to the left eye and then brought back to the Operating Room where the left eye was prepped and draped in the usual sterile ophthalmic fashion and a lid speculum was placed. A paracentesis was created with the side port blade and the anterior chamber was filled with viscoelastic. A near clear corneal incision was performed with the steel keratome. A continuous curvilinear capsulorrhexis was performed with a cystotome followed by the capsulorrhexis forceps. Hydrodissection and hydrodelineation were carried out with BSS on a blunt cannula. The lens was removed in a stop and chop  technique and the remaining cortical material was removed with the irrigation-aspiration handpiece. The capsular bag was inflated with viscoelastic and the Technis [id number] lens was placed in the capsular bag without complication. The remaining viscoelastic was removed from the eye with the irrigation-aspiration handpiece. The wounds were hydrated. The anterior chamber was flushed with BSS and the eye was inflated to physiologic pressure. 0.57ml Vigamox was placed in the anterior chamber. The wounds were found to be water tight. The eye was dressed with Combigan. The patient was  given protective glasses to wear throughout the day and a shield with which to sleep tonight. The patient was also given drops with which to begin a drop regimen today and will follow-up with me in one day. Implant Name Type Inv. Item Serial No. Manufacturer Lot No. LRB No. Used Action  LENS IOL TECNIS EYHANCE 20.5 - [id number] Intraocular Lens LENS IOL TECNIS EYHANCE 20.5 [id number] JOHNSON   Left 1 Implanted    Procedure(s): CATARACT EXTRACTION PHACO AND INTRAOCULAR LENS PLACEMENT (IOC) LEFT VISION BLUE 9.69 00:54.3 (Left)  Electronically signed: Birder Robson 07/29/2020 11:03 AM

## 2020-07-30 ENCOUNTER — Encounter: Payer: Self-pay | Admitting: Ophthalmology

## 2020-09-13 ENCOUNTER — Emergency Department: Payer: Medicare Other

## 2020-09-13 ENCOUNTER — Other Ambulatory Visit: Payer: Self-pay

## 2020-09-13 ENCOUNTER — Inpatient Hospital Stay
Admission: EM | Admit: 2020-09-13 | Discharge: 2020-09-17 | DRG: 368 | Disposition: A | Discharge status: Skilled Nursing Facility | Payer: Medicare Other | Attending: Family Medicine | Admitting: Family Medicine

## 2020-09-13 DIAGNOSIS — Z794 Long term (current) use of insulin: Secondary | ICD-10-CM

## 2020-09-13 DIAGNOSIS — K922 Gastrointestinal hemorrhage, unspecified: Secondary | ICD-10-CM | POA: Diagnosis present

## 2020-09-13 DIAGNOSIS — E1169 Type 2 diabetes mellitus with other specified complication: Secondary | ICD-10-CM | POA: Diagnosis present

## 2020-09-13 DIAGNOSIS — I69391 Dysphagia following cerebral infarction: Secondary | ICD-10-CM

## 2020-09-13 DIAGNOSIS — I2510 Atherosclerotic heart disease of native coronary artery without angina pectoris: Secondary | ICD-10-CM | POA: Diagnosis present

## 2020-09-13 DIAGNOSIS — R1310 Dysphagia, unspecified: Secondary | ICD-10-CM | POA: Diagnosis present

## 2020-09-13 DIAGNOSIS — J9601 Acute respiratory failure with hypoxia: Secondary | ICD-10-CM | POA: Diagnosis present

## 2020-09-13 DIAGNOSIS — I69322 Dysarthria following cerebral infarction: Secondary | ICD-10-CM

## 2020-09-13 DIAGNOSIS — Z7902 Long term (current) use of antithrombotics/antiplatelets: Secondary | ICD-10-CM

## 2020-09-13 DIAGNOSIS — J449 Chronic obstructive pulmonary disease, unspecified: Secondary | ICD-10-CM | POA: Diagnosis present

## 2020-09-13 DIAGNOSIS — K209 Esophagitis, unspecified without bleeding: Secondary | ICD-10-CM

## 2020-09-13 DIAGNOSIS — Z8249 Family history of ischemic heart disease and other diseases of the circulatory system: Secondary | ICD-10-CM

## 2020-09-13 DIAGNOSIS — Z95828 Presence of other vascular implants and grafts: Secondary | ICD-10-CM | POA: Diagnosis not present

## 2020-09-13 DIAGNOSIS — N400 Enlarged prostate without lower urinary tract symptoms: Secondary | ICD-10-CM | POA: Diagnosis present

## 2020-09-13 DIAGNOSIS — K2101 Gastro-esophageal reflux disease with esophagitis, with bleeding: Principal | ICD-10-CM | POA: Diagnosis present

## 2020-09-13 DIAGNOSIS — H5461 Unqualified visual loss, right eye, normal vision left eye: Secondary | ICD-10-CM | POA: Diagnosis present

## 2020-09-13 DIAGNOSIS — I959 Hypotension, unspecified: Secondary | ICD-10-CM | POA: Diagnosis present

## 2020-09-13 DIAGNOSIS — K5900 Constipation, unspecified: Secondary | ICD-10-CM | POA: Diagnosis not present

## 2020-09-13 DIAGNOSIS — I714 Abdominal aortic aneurysm, without rupture: Secondary | ICD-10-CM | POA: Diagnosis present

## 2020-09-13 DIAGNOSIS — Z7951 Long term (current) use of inhaled steroids: Secondary | ICD-10-CM

## 2020-09-13 DIAGNOSIS — I69354 Hemiplegia and hemiparesis following cerebral infarction affecting left non-dominant side: Secondary | ICD-10-CM

## 2020-09-13 DIAGNOSIS — I110 Hypertensive heart disease with heart failure: Secondary | ICD-10-CM | POA: Diagnosis present

## 2020-09-13 DIAGNOSIS — I10 Essential (primary) hypertension: Secondary | ICD-10-CM | POA: Diagnosis not present

## 2020-09-13 DIAGNOSIS — I5032 Chronic diastolic (congestive) heart failure: Secondary | ICD-10-CM | POA: Diagnosis present

## 2020-09-13 DIAGNOSIS — Z86718 Personal history of other venous thrombosis and embolism: Secondary | ICD-10-CM

## 2020-09-13 DIAGNOSIS — Z91030 Bee allergy status: Secondary | ICD-10-CM

## 2020-09-13 DIAGNOSIS — Z7982 Long term (current) use of aspirin: Secondary | ICD-10-CM

## 2020-09-13 DIAGNOSIS — Z86711 Personal history of pulmonary embolism: Secondary | ICD-10-CM

## 2020-09-13 DIAGNOSIS — E1151 Type 2 diabetes mellitus with diabetic peripheral angiopathy without gangrene: Secondary | ICD-10-CM | POA: Diagnosis present

## 2020-09-13 DIAGNOSIS — Z79899 Other long term (current) drug therapy: Secondary | ICD-10-CM

## 2020-09-13 DIAGNOSIS — K8020 Calculus of gallbladder without cholecystitis without obstruction: Secondary | ICD-10-CM | POA: Diagnosis present

## 2020-09-13 DIAGNOSIS — E785 Hyperlipidemia, unspecified: Secondary | ICD-10-CM | POA: Diagnosis present

## 2020-09-13 DIAGNOSIS — F32A Depression, unspecified: Secondary | ICD-10-CM | POA: Diagnosis present

## 2020-09-13 DIAGNOSIS — D45 Polycythemia vera: Secondary | ICD-10-CM | POA: Diagnosis present

## 2020-09-13 DIAGNOSIS — Z20822 Contact with and (suspected) exposure to covid-19: Secondary | ICD-10-CM | POA: Diagnosis present

## 2020-09-13 DIAGNOSIS — Z87891 Personal history of nicotine dependence: Secondary | ICD-10-CM

## 2020-09-13 LAB — COMPREHENSIVE METABOLIC PANEL
ALT: 16 U/L (ref 0–44)
AST: 16 U/L (ref 15–41)
Albumin: 3.7 g/dL (ref 3.5–5.0)
Alkaline Phosphatase: 71 U/L (ref 38–126)
Anion gap: 11 (ref 5–15)
BUN: 26 mg/dL — ABNORMAL HIGH (ref 8–23)
CO2: 21 mmol/L — ABNORMAL LOW (ref 22–32)
Calcium: 9 mg/dL (ref 8.9–10.3)
Chloride: 105 mmol/L (ref 98–111)
Creatinine, Ser: 1.25 mg/dL — ABNORMAL HIGH (ref 0.61–1.24)
GFR, Estimated: >60 mL/min (ref >60)
Glucose, Bld: 202 mg/dL — ABNORMAL HIGH (ref 70–99)
Potassium: 4.4 mmol/L (ref 3.5–5.1)
Sodium: 137 mmol/L (ref 135–145)
Total Bilirubin: 1 mg/dL (ref 0.3–1.2)
Total Protein: 7.6 g/dL (ref 6.5–8.1)

## 2020-09-13 LAB — CBC WITH DIFFERENTIAL/PLATELET
Abs Immature Granulocytes: 0.08 10*3/uL — ABNORMAL HIGH (ref 0.00–0.07)
Basophils Absolute: 0.1 10*3/uL (ref 0.0–0.1)
Basophils Relative: 0 %
Eosinophils Absolute: 0.1 10*3/uL (ref 0.0–0.5)
Eosinophils Relative: 1 %
HCT: 47.8 % (ref 39.0–52.0)
Hemoglobin: 15.7 g/dL (ref 13.0–17.0)
Immature Granulocytes: 1 %
Lymphocytes Relative: 14 %
Lymphs Abs: 2.4 10*3/uL (ref 0.7–4.0)
MCH: 33.4 pg (ref 26.0–34.0)
MCHC: 32.8 g/dL (ref 30.0–36.0)
MCV: 101.7 fL — ABNORMAL HIGH (ref 80.0–100.0)
Monocytes Absolute: 1.1 10*3/uL — ABNORMAL HIGH (ref 0.1–1.0)
Monocytes Relative: 6 %
Neutro Abs: 12.7 10*3/uL — ABNORMAL HIGH (ref 1.7–7.7)
Neutrophils Relative %: 78 %
Platelets: 242 10*3/uL (ref 150–400)
RBC: 4.7 MIL/uL (ref 4.22–5.81)
RDW: 12.5 % (ref 11.5–15.5)
WBC: 16.4 10*3/uL — ABNORMAL HIGH (ref 4.0–10.5)
nRBC: 0.1 % (ref 0.0–0.2)

## 2020-09-13 LAB — URINALYSIS, ROUTINE W REFLEX MICROSCOPIC
Bacteria, UA: NONE SEEN
Bilirubin Urine: NEGATIVE
Glucose, UA: NEGATIVE mg/dL
Hgb urine dipstick: NEGATIVE
Ketones, ur: 5 mg/dL — AB
Leukocytes,Ua: NEGATIVE
Nitrite: NEGATIVE
Protein, ur: 30 mg/dL — AB
Specific Gravity, Urine: 1.045 — ABNORMAL HIGH (ref 1.005–1.030)
Squamous Epithelial / HPF: NONE SEEN (ref 0–5)
pH: 5 (ref 5.0–8.0)

## 2020-09-13 LAB — PROTIME-INR
INR: 1.1 (ref 0.8–1.2)
Prothrombin Time: 13.6 seconds (ref 11.4–15.2)

## 2020-09-13 LAB — TYPE AND SCREEN
ABO/RH(D): O POS
Antibody Screen: NEGATIVE

## 2020-09-13 LAB — LIPASE, BLOOD: Lipase: 32 U/L (ref 11–51)

## 2020-09-13 LAB — APTT: aPTT: 31 seconds (ref 24–36)

## 2020-09-13 LAB — RESP PANEL BY RT-PCR (FLU A&B, COVID) ARPGX2
Influenza A by PCR: NEGATIVE
Influenza B by PCR: NEGATIVE
SARS Coronavirus 2 by RT PCR: NEGATIVE

## 2020-09-13 LAB — GLUCOSE, CAPILLARY: Glucose-Capillary: 147 mg/dL — ABNORMAL HIGH (ref 70–99)

## 2020-09-13 LAB — LACTIC ACID, PLASMA: Lactic Acid, Venous: 1.9 mmol/L (ref 0.5–1.9)

## 2020-09-13 MED ORDER — IOHEXOL 350 MG/ML SOLN
125.0000 mL | Freq: Once | INTRAVENOUS | Status: AC | PRN
  Administered 2020-09-13: 125 mL via INTRAVENOUS

## 2020-09-13 MED ORDER — METOPROLOL TARTRATE 25 MG PO TABS
12.5000 mg | ORAL_TABLET | Freq: Two times a day (BID) | ORAL | Status: DC
  Administered 2020-09-13 – 2020-09-17 (×6): 12.5 mg via ORAL
  Filled 2020-09-13 (×7): qty 1

## 2020-09-13 MED ORDER — MELATONIN 5 MG PO TABS
5.0000 mg | ORAL_TABLET | Freq: Every day | ORAL | Status: DC
  Administered 2020-09-13 – 2020-09-16 (×3): 5 mg via ORAL
  Filled 2020-09-13 (×3): qty 1

## 2020-09-13 MED ORDER — SODIUM CHLORIDE 0.9 % IV SOLN
8.0000 mg/h | INTRAVENOUS | Status: DC
  Administered 2020-09-13 – 2020-09-16 (×5): 8 mg/h via INTRAVENOUS
  Filled 2020-09-13 (×6): qty 80

## 2020-09-13 MED ORDER — POLYVINYL ALCOHOL 1.4 % OP SOLN
1.0000 [drp] | Freq: Three times a day (TID) | OPHTHALMIC | Status: DC
  Administered 2020-09-14 – 2020-09-17 (×8): 1 [drp] via OPHTHALMIC
  Filled 2020-09-13 (×2): qty 15

## 2020-09-13 MED ORDER — LISINOPRIL 20 MG PO TABS: 40.0000 mg | ORAL_TABLET | Freq: Every day | ORAL | Status: DC

## 2020-09-13 MED ORDER — SODIUM CHLORIDE 0.9 % IV BOLUS
1000.0000 mL | Freq: Once | INTRAVENOUS | Status: AC
  Administered 2020-09-13: 1000 mL via INTRAVENOUS

## 2020-09-13 MED ORDER — SODIUM CHLORIDE 0.9 % IV SOLN
12.5000 mg | Freq: Once | INTRAVENOUS | Status: AC
  Administered 2020-09-13: 12.5 mg via INTRAVENOUS
  Filled 2020-09-13: qty 0.5

## 2020-09-13 MED ORDER — SERTRALINE HCL 100 MG PO TABS
100.0000 mg | ORAL_TABLET | Freq: Every day | ORAL | Status: DC
  Administered 2020-09-14 – 2020-09-17 (×3): 100 mg via ORAL
  Filled 2020-09-13 (×3): qty 2
  Filled 2020-09-13: qty 1

## 2020-09-13 MED ORDER — AMLODIPINE BESYLATE 5 MG PO TABS: 5.0000 mg | ORAL_TABLET | Freq: Every day | ORAL | Status: DC

## 2020-09-13 MED ORDER — KETOROLAC TROMETHAMINE 0.5 % OP SOLN
1.0000 [drp] | Freq: Two times a day (BID) | OPHTHALMIC | Status: DC
  Administered 2020-09-14 – 2020-09-17 (×6): 1 [drp] via OPHTHALMIC
  Filled 2020-09-13 (×2): qty 3

## 2020-09-13 MED ORDER — TAMSULOSIN HCL 0.4 MG PO CAPS
0.4000 mg | ORAL_CAPSULE | Freq: Every day | ORAL | Status: DC
  Administered 2020-09-14 – 2020-09-16 (×3): 0.4 mg via ORAL
  Filled 2020-09-13 (×3): qty 1

## 2020-09-13 MED ORDER — ONDANSETRON HCL 4 MG/2ML IJ SOLN
4.0000 mg | Freq: Once | INTRAMUSCULAR | Status: AC
  Administered 2020-09-13: 4 mg via INTRAVENOUS

## 2020-09-13 MED ORDER — INSULIN ASPART 100 UNIT/ML ~~LOC~~ SOLN
0.0000 [IU] | Freq: Three times a day (TID) | SUBCUTANEOUS | Status: DC
  Administered 2020-09-16 – 2020-09-17 (×2): 1 [IU] via SUBCUTANEOUS
  Filled 2020-09-13 (×3): qty 1

## 2020-09-13 MED ORDER — TRAZODONE HCL 50 MG PO TABS: 25.0000 mg | ORAL_TABLET | Freq: Every evening | ORAL | Status: DC | PRN

## 2020-09-13 MED ORDER — SODIUM CHLORIDE 0.9 % IV SOLN
80.0000 mg | Freq: Once | INTRAVENOUS | Status: AC
  Administered 2020-09-13: 80 mg via INTRAVENOUS
  Filled 2020-09-13: qty 80

## 2020-09-13 MED ORDER — LEVETIRACETAM 250 MG PO TABS
250.0000 mg | ORAL_TABLET | Freq: Two times a day (BID) | ORAL | Status: DC
  Filled 2020-09-13 (×3): qty 1

## 2020-09-13 MED ORDER — MOMETASONE FURO-FORMOTEROL FUM 100-5 MCG/ACT IN AERO
2.0000 | INHALATION_SPRAY | Freq: Two times a day (BID) | RESPIRATORY_TRACT | Status: DC
  Administered 2020-09-14 – 2020-09-17 (×6): 2 via RESPIRATORY_TRACT
  Filled 2020-09-13 (×2): qty 8.8

## 2020-09-13 MED ORDER — GABAPENTIN 100 MG PO CAPS
300.0000 mg | ORAL_CAPSULE | Freq: Every day | ORAL | Status: DC
  Administered 2020-09-14 – 2020-09-17 (×4): 300 mg via ORAL
  Filled 2020-09-13: qty 3
  Filled 2020-09-13 (×3): qty 1

## 2020-09-13 MED ORDER — DULAGLUTIDE 1.5 MG/0.5ML ~~LOC~~ SOAJ: 1.5000 mg | SUBCUTANEOUS | Status: DC

## 2020-09-13 MED ORDER — EZETIMIBE 10 MG PO TABS
10.0000 mg | ORAL_TABLET | Freq: Every day | ORAL | Status: DC
  Administered 2020-09-14 – 2020-09-17 (×4): 10 mg via ORAL
  Filled 2020-09-13 (×5): qty 1

## 2020-09-13 MED ORDER — INSULIN DETEMIR 100 UNIT/ML ~~LOC~~ SOLN
18.0000 [IU] | Freq: Every day | SUBCUTANEOUS | Status: DC
  Filled 2020-09-13: qty 0.18

## 2020-09-13 MED ORDER — ACETAMINOPHEN 325 MG PO TABS: 650.0000 mg | ORAL_TABLET | Freq: Four times a day (QID) | ORAL | Status: DC | PRN

## 2020-09-13 MED ORDER — ONDANSETRON HCL 4 MG/2ML IJ SOLN
4.0000 mg | Freq: Four times a day (QID) | INTRAMUSCULAR | Status: DC | PRN
  Administered 2020-09-16 (×2): 4 mg via INTRAVENOUS
  Filled 2020-09-13 (×2): qty 2

## 2020-09-13 MED ORDER — PREDNISOLONE ACETATE 1 % OP SUSP
1.0000 [drp] | Freq: Two times a day (BID) | OPHTHALMIC | Status: DC
  Administered 2020-09-14 – 2020-09-17 (×5): 1 [drp] via OPHTHALMIC
  Filled 2020-09-13: qty 1

## 2020-09-13 MED ORDER — SEMAGLUTIDE(0.25 OR 0.5MG/DOS) 2 MG/1.5ML ~~LOC~~ SOPN: 0.5000 mg | PEN_INJECTOR | SUBCUTANEOUS | Status: DC

## 2020-09-13 MED ORDER — MAGNESIUM HYDROXIDE 400 MG/5ML PO SUSP: 30.0000 mL | Freq: Every day | ORAL | Status: DC | PRN

## 2020-09-13 MED ORDER — SODIUM CHLORIDE 0.9 % IV SOLN: INTRAVENOUS | Status: DC

## 2020-09-13 MED ORDER — ONDANSETRON HCL 4 MG PO TABS: 4.0000 mg | ORAL_TABLET | Freq: Four times a day (QID) | ORAL | Status: DC | PRN

## 2020-09-13 MED ORDER — ATORVASTATIN CALCIUM 10 MG PO TABS
20.0000 mg | ORAL_TABLET | Freq: Every day | ORAL | Status: DC
  Administered 2020-09-14 – 2020-09-17 (×4): 20 mg via ORAL
  Filled 2020-09-13: qty 1
  Filled 2020-09-13: qty 2
  Filled 2020-09-13 (×2): qty 1

## 2020-09-13 MED ORDER — ONDANSETRON HCL 4 MG PO TABS: 4.0000 mg | ORAL_TABLET | Freq: Every day | ORAL | Status: DC

## 2020-09-13 MED ORDER — DIVALPROEX SODIUM 125 MG PO DR TAB
125.0000 mg | DELAYED_RELEASE_TABLET | Freq: Every day | ORAL | Status: DC
  Administered 2020-09-14 – 2020-09-16 (×3): 125 mg via ORAL
  Filled 2020-09-13 (×4): qty 1

## 2020-09-13 MED ORDER — PANTOPRAZOLE SODIUM 40 MG IV SOLR: 40.0000 mg | Freq: Two times a day (BID) | INTRAVENOUS | Status: DC

## 2020-09-13 MED ORDER — SIMETHICONE 80 MG PO CHEW
80.0000 mg | CHEWABLE_TABLET | Freq: Four times a day (QID) | ORAL | Status: DC | PRN
  Filled 2020-09-13: qty 1

## 2020-09-13 MED ORDER — METOCLOPRAMIDE HCL 5 MG/ML IJ SOLN
10.0000 mg | Freq: Once | INTRAMUSCULAR | Status: AC
  Administered 2020-09-13: 10 mg via INTRAVENOUS
  Filled 2020-09-13: qty 2

## 2020-09-13 MED ORDER — ACETAMINOPHEN 650 MG RE SUPP: 650.0000 mg | Freq: Four times a day (QID) | RECTAL | Status: DC | PRN

## 2020-09-13 MED ORDER — POTASSIUM CHLORIDE CRYS ER 20 MEQ PO TBCR
20.0000 meq | EXTENDED_RELEASE_TABLET | Freq: Two times a day (BID) | ORAL | Status: DC
  Administered 2020-09-14 – 2020-09-17 (×6): 20 meq via ORAL
  Filled 2020-09-13 (×7): qty 1

## 2020-09-13 NOTE — ED Notes (Signed)
Called pharmacy regarding Phenergan drip

## 2020-09-13 NOTE — ED Triage Notes (Addendum)
Pt coming from [facility], with a C/C of vomiting blood , unknown time,does have a history of GI bleeding . Non Ambulatory ( Uses Wheel Chair )   Currently on antibiotics ( Colon infection ) ,  Per EMS Hypotensive at facility .

## 2020-09-13 NOTE — ED Notes (Signed)
1 set of cultures sent to lab

## 2020-09-13 NOTE — ED Provider Notes (Signed)
[facility] Emergency Department Provider Note  ____________________________________________   Event Date/Time   First MD Initiated Contact with Patient 09/13/20 1855     (approximate)  I have reviewed the triage vital signs and the nursing notes.   HISTORY  Chief Complaint GI Problem (GI Bleed )    HPI  Chad Combs is a 65 y.o. male with AAA which patient does not want intervention on, anemia, COPD, prior PE previously on Xarelto but then had an IVC filter placed therefore now off who comes in for concern for possible GI bleed.  Patient states that he started vomiting this morning.  Initially the vomit was just what he had eaten but that he developed darker vomit, intermittent, nothing makes it better, nothing makes it worse.  With EMS he was noted to be hypotensive and 90% on room air.  Patient was placed on 3 L and given 500 cc of fluid as well as 4 mg of Zofran.  Patient states that he gets abdominal pain when he eats after going on for a while.    Past Medical History:  Diagnosis Date  . AAA (abdominal aortic aneurysm) ([place])    a. 08/2016 CTA: 3.1cm saccular aneurysm of distal Ao arch w/ penetrating atheromatous ulcer. 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 09/2017 CTA chest/abd/pelvis [facility]): 6.3cm infrarenal AAA, unchanged bilateral CIA aneurysms (R 3.5cm, L 2.8cm)-->pt refused intervention.  . Anemia   . Blind right eye   . Blind right eye   . Carotid arterial disease ([place])    a. 12/2016 CTA Neck: RICA 100%. LICA >09%; b. 08/2669 s/p trans aortic LICA revascularization and stent placement [facility]); c. 03/2017 Carotid [facility]): No significant LICA stenosis, patent stent.  Marland Kitchen COPD (chronic obstructive pulmonary disease) ([place])   . Depression   . Diabetes mellitus without complication ([place])   . Diastolic dysfunction    a. 08/2016 Echo: EF 60%, Gr1 DD, mild MR, mildly dil LA/RV. No  cardiac source of emboli.  . Femoral DVT (deep venous thrombosis) ([place])    a. 08/2016 L Fem Vein DVT ->xarelto/IVC filter. Xarelto subsequently d/c'd.  . Hemiparesis ([place])   . Hemiplegia ([place]) left  . History of pulmonary embolism    a. 08/2016 following CVA-->chronic xarelto, s/p IVC filter. Xarelto subsequently d/c'd.  Marland Kitchen History of right MCA stroke    a. 08/2016 MRI: R MCA infarct w/ occlusion of RICA & R MCA.   Marland Kitchen Hypertension   . Stroke ([place])   . Tobacco abuse     Patient Active Problem List   Diagnosis Date Noted  . Acute colitis 01/24/2020  . Hematochezia 01/24/2020  . Chest pain 02/17/2018  . UTI (urinary tract infection) 05/19/2017  . Seizure-like activity ([place]) 05/19/2017  . Hyperlipidemia 11/08/2016  . Type II diabetes mellitus with neurological manifestations, uncontrolled ([place]) 10/04/2016  . Type 2 diabetes mellitus with hyperlipidemia ([place]) 10/04/2016  . Abdominal aortic aneurysm (AAA) greater than 5.5 cm in diameter in male [facility])   . Acute deep vein thrombosis (DVT) of femoral vein of left lower extremity ([place])   . Pulmonary embolus ([place])   . Adjustment disorder   . Acute right MCA stroke ([place]) 08/25/2016  . Left hemiplegia ([place])   . Dysarthria, post-stroke   . Dysphagia, post-stroke   . Carotid stenosis   . Benign essential HTN   . Leukocytosis   . Polycythemia vera ([place])   .  Tobacco abuse   . Chronic obstructive pulmonary disease ([place])   . 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    Past Surgical History:  Procedure Laterality Date  . CAROTID ENDARTERECTOMY Left   . CATARACT EXTRACTION W/PHACO Left 07/29/2020   Procedure: CATARACT EXTRACTION PHACO AND INTRAOCULAR LENS PLACEMENT (IOC) LEFT VISION BLUE 9.69 00:54.3;  Surgeon: Birder Robson, MD;  Location: [place];  Service: Ophthalmology;  Laterality: Left;  . COLONOSCOPY WITH PROPOFOL N/A 05/26/2020   Procedure: COLONOSCOPY WITH PROPOFOL;  Surgeon: Lesly Rubenstein, MD;  Location: [facility] ENDOSCOPY;  Service:  Endoscopy;  Laterality: N/A;  . ENDOVASCULAR REPAIR/STENT GRAFT N/A 01/29/2020   Procedure: ENDOVASCULAR REPAIR/STENT GRAFT;  Surgeon: Katha Cabal, MD;  Location: [place] CV LAB;  Service: Cardiovascular;  Laterality: N/A;  . IVC FILTER INSERTION N/A 08/24/2016   Procedure: IVC Filter Insertion;  Surgeon: Katha Cabal, MD;  Location: [place] CV LAB;  Service: Cardiovascular;  Laterality: N/A;    Prior to Admission medications   Medication Sig Start Date End Date Taking? Authorizing Provider  acetaminophen (ACETAMINOPHEN 8 HOUR) 650 MG CR tablet Take 650 mg by mouth 3 (three) times daily.    [provider]  amLODipine (NORVASC) 5 MG tablet Take 5 mg by mouth daily.    [provider]  aspirin 81 MG chewable tablet Chew 81 mg by mouth daily.    [provider]  atorvastatin (LIPITOR) 20 MG tablet Take 20 mg by mouth daily. 01/24/20   [provider]  budesonide-formoterol (SYMBICORT) 80-4.5 MCG/ACT inhaler Inhale 2 puffs into the lungs every 12 (twelve) hours.    [provider]  clopidogrel (PLAVIX) 75 MG tablet Take 75 mg by mouth daily.    [provider]  divalproex (DEPAKOTE) 125 MG DR tablet Take 125 mg by mouth at bedtime. 01/24/20   [provider]  docusate sodium (COLACE) 100 MG capsule Take 1 capsule (100 mg total) by mouth daily. 01/31/20   Swayze, Ava, DO  Dulaglutide (TRULICITY) 1.5 ZO/1.0RU SOPN Inject 1.5 mg into the skin once a week. Friday    [provider]  ezetimibe (ZETIA) 10 MG tablet Take 1 tablet (10 mg total) by mouth daily at 6 PM. Patient taking differently: Take 10 mg by mouth daily. 10/01/16   Love, Ivan Anchors, PA-C  gabapentin (NEURONTIN) 300 MG capsule Take 300 mg by mouth daily. 01/24/20   [provider]  guaiFENesin-dextromethorphan (ROBITUSSIN DM) 100-10 MG/5ML syrup Take 15 mLs by mouth every 4 (four) hours as needed for cough. 01/30/20   Swayze, Ava, DO  LEVEMIR  FLEXTOUCH 100 UNIT/ML FlexPen Inject 18 Units into the skin at bedtime. 01/21/20   [provider]  levETIRAcetam (KEPPRA) 250 MG tablet Take 250 mg by mouth 2 (two) times daily. 01/24/20   [provider]  lisinopril (ZESTRIL) 40 MG tablet Take 40 mg by mouth daily. 01/24/20   [provider]  melatonin 3 MG TABS tablet Take 3 mg by mouth at bedtime.    [provider]  metoprolol tartrate (LOPRESSOR) 25 MG tablet Take 0.5 tablets (12.5 mg total) by mouth 2 (two) times daily. 10/06/16   Hendricks Limes, MD  ondansetron (ZOFRAN) 4 MG tablet Take 4 mg by mouth at bedtime. 01/22/20   [provider]  OZEMPIC, 0.25 OR 0.5 MG/DOSE, 2 MG/1.5ML SOPN Inject 0.5 mg into the skin once a week. Friday Patient not taking: Reported on 07/22/2020 01/04/20   [provider]  pantoprazole ([place])  40 MG tablet Take 40 mg by mouth 2 (two) times daily.  01/24/20   [provider]  polyethylene glycol (MIRALAX / GLYCOLAX) 17 g packet Take 17 g by mouth daily.    [provider]  potassium chloride SA (KLOR-CON) 20 MEQ tablet Take 20 mEq by mouth 2 (two) times daily. 01/23/20   [provider]  senna (SENOKOT) 8.6 MG TABS tablet Take 2 tablets by mouth at bedtime.    [provider]  sertraline (ZOLOFT) 100 MG tablet Take 100 mg by mouth daily. 01/24/20   [provider]  simethicone (MYLICON) 128 MG chewable tablet Chew 125 mg by mouth 3 (three) times daily.    [provider]  tamsulosin (FLOMAX) 0.4 MG CAPS capsule Take 1 capsule (0.4 mg total) by mouth daily after supper. 10/01/16   Love, Ivan Anchors, PA-C    Allergies Bee pollen  Family History  Problem Relation Age of Onset  . Cancer Mother   . Heart disease Father     Social History Social History   Tobacco Use  . Smoking status: Former Smoker    Packs/day: 1.50    Years: 45.00    Pack years: 67.50    Types: Cigarettes    Quit date: 08/30/2016    Years  since quitting: 4.0  . Smokeless tobacco: Never Used  Vaping Use  . Vaping Use: Never used  Substance Use Topics  . Alcohol use: Yes    Alcohol/week: 0.0 standard drinks    Comment: occasional (once every couple months)  . Drug use: No      Review of Systems Constitutional: No fever/chills Eyes: No visual changes. ENT: No sore throat. Cardiovascular: Denies chest pain. Respiratory: Denies shortness of breath. Gastrointestinal: Positive vomiting coffee-ground emesis, possible abdominal pain Genitourinary: Negative for dysuria. Musculoskeletal: Negative for back pain. Skin: Negative for rash. Neurological: Negative for headaches, focal weakness or numbness. All other ROS negative ____________________________________________   PHYSICAL EXAM:  VITAL SIGNS: ED Triage Vitals [09/13/20 1853]  Enc Vitals Group     BP      Pulse      Resp      Temp      Temp src      SpO2 90 %     Weight      Height      Head Circumference      Peak Flow      Pain Score      Pain Loc      Pain Edu?      Excl. in [place]?     Constitutional: Alert and oriented.  Actively vomiting Eyes: Conjunctivae are normal. EOMI. Head: Atraumatic. Nose: No congestion/rhinnorhea. Mouth/Throat: Mucous membranes are moist.   Neck: No stridor. Trachea Midline. FROM Cardiovascular: Tachycardic, regular rhythm. Grossly normal heart sounds.  Good peripheral circulation. Respiratory: Normal respiratory effort.  No retractions. Lungs CTAB. Gastrointestinal: Soft and nontender. No distention. No abdominal bruits.  Musculoskeletal: No lower extremity tenderness nor edema.  No joint effusions. Neurologic:  Normal speech and language. No gross focal neurologic deficits are appreciated.  Skin:  Skin is warm, dry and intact. No rash noted. Psychiatric: Mood and affect are normal. Speech and behavior are normal. GU: Deferred   ____________________________________________   LABS (all labs ordered are listed, but  only abnormal results are displayed)  Labs Reviewed  CBC WITH DIFFERENTIAL/PLATELET - Abnormal; Notable for the following components:      Result Value   WBC 16.4 (*)  MCV 101.7 (*)    Neutro Abs 12.7 (*)    Monocytes Absolute 1.1 (*)    Abs Immature Granulocytes 0.08 (*)    All other components within normal limits  COMPREHENSIVE METABOLIC PANEL - Abnormal; Notable for the following components:   CO2 21 (*)    Glucose, Bld 202 (*)    BUN 26 (*)    Creatinine, Ser 1.25 (*)    All other components within normal limits  URINALYSIS, ROUTINE W REFLEX MICROSCOPIC - Abnormal; Notable for the following components:   Color, Urine YELLOW (*)    APPearance HAZY (*)    Specific Gravity, Urine 1.045 (*)    Ketones, ur 5 (*)    Protein, ur 30 (*)    All other components within normal limits  RESP PANEL BY RT-PCR (FLU A&B, COVID) ARPGX2  APTT  PROTIME-INR  LIPASE, BLOOD  LACTIC ACID, PLASMA  LACTIC ACID, PLASMA  TYPE AND SCREEN   ____________________________________________   ED ECG REPORT I, Vanessa Dawson, the attending physician, personally viewed and interpreted this ECG.  Sinus tachycardia rate of 111, no ST elevation, no T wave versions, normal intervals ____________________________________________  RADIOLOGY   Official radiology report(s): CT Angio Chest PE W and/or Wo Contrast  Result Date: 09/13/2020 CLINICAL DATA:  Hematemesis, shortness of breath EXAM: CT ANGIOGRAPHY CHEST WITH CONTRAST TECHNIQUE: Multidetector CT imaging of the chest was performed using the standard protocol during bolus administration of intravenous contrast. Multiplanar CT image reconstructions and MIPs were obtained to evaluate the vascular anatomy. CONTRAST:  128mL OMNIPAQUE IOHEXOL 350 MG/ML SOLN COMPARISON:  09/13/2020 FINDINGS: Cardiovascular: This is a technically adequate evaluation of the pulmonary vasculature. No filling defects or pulmonary emboli. Stable focal dilation of the aortic arch just  distal to the takeoff of the left subclavian artery, measuring up to 3.3 cm, not appreciably changed. No evidence of thoracic aortic aneurysm or dissection. The heart is unremarkable without pericardial effusion. Extensive atherosclerosis thin the coronary vasculature, greatest in the LAD distribution. Mediastinum/Nodes: There is diffuse esophageal wall thickening, consistent with esophagitis. Fluid-filled esophagus and stomach are noted. The thyroid and trachea are unremarkable.  No pathologic adenopathy. Lungs/Pleura: Upper lobe predominant emphysema. Patchy airspace disease within the left lower lobe may be inflammatory, infectious, or related to aspiration. No effusion or pneumothorax. Upper Abdomen: No acute abnormality. Musculoskeletal: No acute or destructive bony lesions. Reconstructed images demonstrate no additional findings. Review of the MIP images confirms the above findings. IMPRESSION: 1. No evidence of pulmonary embolus. 2. Diffuse esophageal wall thickening consistent with esophagitis. 3. Fluid-filled stomach and esophagus may be related to history of vomiting. 4. Stable focal dilation of the distal aortic arch, not appreciably changed since prior study. No aneurysm or dissection. 5. Patchy consolidation left lower lobe may be inflammatory, infectious, or related to aspiration. Electronically Signed   By: Randa Ngo M.D.   On: 09/13/2020 20:58   DG Chest Portable 1 View  Result Date: 09/13/2020 CLINICAL DATA:  Hematemesis, shortness of breath EXAM: PORTABLE CHEST 1 VIEW COMPARISON:  06/02/2020 FINDINGS: Single frontal view of the chest demonstrates an unremarkable cardiac silhouette. No airspace disease, effusion, or pneumothorax. No acute bony abnormalities. IMPRESSION: 1. No acute intrathoracic process. Electronically Signed   By: Randa Ngo M.D.   On: 09/13/2020 19:30   CT Angio Abd/Pel W and/or Wo Contrast  Result Date: 09/13/2020 CLINICAL DATA:  Hematemesis, gastrointestinal  bleeding EXAM: CTA ABDOMEN AND PELVIS WITHOUT AND WITH CONTRAST TECHNIQUE: Multidetector CT imaging of the  abdomen and pelvis was performed using the standard protocol during bolus administration of intravenous contrast. Multiplanar reconstructed images and MIPs were obtained and reviewed to evaluate the vascular anatomy. CONTRAST:  19mL OMNIPAQUE IOHEXOL 350 MG/ML SOLN COMPARISON:  01/24/2020 FINDINGS: VASCULAR Aorta: Interval endoluminal stent graft spanning the distal abdominal aortic aneurysm and right common iliac artery aneurysm seen previously. Outer dimensions of the aneurysm sac now measure 5.2 x 7.3 cm, previously measuring 5.2 x 7.8 cm. There is no evidence of endoleak. Calcifications are seen within the excluded lumen of the aneurysm. The endoluminal stent graft is patent, with mild atheromatous plaque seen within the bilateral iliac limbs. Celiac: Mild stenosis at the origin of the celiac artery unchanged, less than 50%. No aneurysm or dissection. SMA: Patent without evidence of aneurysm, dissection, vasculitis or significant stenosis. Renals: Both renal arteries are patent without evidence of aneurysm, dissection, vasculitis, fibromuscular dysplasia or significant stenosis. IMA: The IMA does not opacified its origin, which arises from the excluded aneurysm sac. Distal reconstitution via collaterals from the SMA. Inflow: There is mild stenosis of the bilateral external iliac arteries, without focal stenosis. Proximal Outflow: Bilateral common femoral and visualized portions of the superficial and profunda femoral arteries are patent without evidence of aneurysm, dissection, vasculitis or significant stenosis. Veins: IVC filter identified.  No other focal abnormalities. Review of the MIP images confirms the above findings. NON-VASCULAR Lower chest: Patchy consolidation left lower lobe consistent with airspace disease. This could be inflammatory, infectious, or related to aspiration. Hepatobiliary: Small  gallstones are identified without cholecystitis. Liver is unremarkable. Pancreas: Unremarkable. No pancreatic ductal dilatation or surrounding inflammatory changes. Spleen: Normal in size without focal abnormality. Adrenals/Urinary Tract: Adrenal glands are unremarkable. Kidneys are normal, without renal calculi, focal lesion, or hydronephrosis. Bladder is unremarkable. Stomach/Bowel: No bowel obstruction or ileus. No bowel wall thickening or inflammatory change. Wall thickening of the distal thoracic esophagus consistent with esophagitis. No evidence of intraluminal contrast accumulation to suggest active gastrointestinal bleeding. Lymphatic: No pathologic adenopathy within the abdomen or pelvis. Reproductive: Prostate is unremarkable. Other: No free fluid or free gas.  No abdominal wall hernia. Musculoskeletal: Chronic compression deformities at T12 and L2. No acute bony abnormalities. Reconstructed images demonstrate no additional findings. IMPRESSION: VASCULAR 1. Interval placement of an endoluminal stent graft traversing the distal abdominal aortic aneurysm. No evidence of endoleak or complication. 2. Diffuse atherosclerosis, with mild narrowing at the origin of the celiac artery unchanged. Mild diffuse atheromatous plaque throughout the iliac limbs of the endoluminal stent graft. 3. Stable IVC filter. NON-VASCULAR 1. Esophageal wall thickening consistent with esophagitis. 2. No evidence of active gastrointestinal bleeding. 3. Patchy left lower lobe airspace disease which may be inflammatory, infectious, or related to aspiration. 4. Cholelithiasis without cholecystitis. Electronically Signed   By: Randa Ngo M.D.   On: 09/13/2020 21:12    ____________________________________________   PROCEDURES  Procedure(s) performed (including Critical Care):  .1-3 Lead EKG Interpretation Performed by: Vanessa Gruetli-[place], MD Authorized by: Vanessa [place], MD     Interpretation: abnormal     ECG rate:  110s     ECG rate assessment: tachycardic     Rhythm: sinus tachycardia     Ectopy: none     Conduction: normal   .Critical Care Performed by: Vanessa [place], MD Authorized by: Vanessa [place], MD   Critical care provider statement:    Critical care time (minutes):  45   Critical care was necessary to treat or prevent imminent or life-threatening deterioration  of the following conditions: Upper GI bleed who was hypotensive.   Critical care was time spent personally by me on the following activities:  Discussions with consultants, evaluation of patient's response to treatment, examination of patient, ordering and performing treatments and interventions, ordering and review of laboratory studies, ordering and review of radiographic studies, pulse oximetry, re-evaluation of patient's condition, obtaining history from patient or surrogate and review of old charts     ____________________________________________   INITIAL IMPRESSION / [place] / ED COURSE  Chad Combs was evaluated in Emergency Department on 09/13/2020 for the symptoms described in the history of present illness. He was evaluated in the context of the global COVID-19 pandemic, which necessitated consideration that the patient might be at risk for infection with the SARS-CoV-2 virus that causes COVID-19. Institutional protocols and algorithms that pertain to the evaluation of patients at risk for COVID-19 are in a state of rapid change based on information released by regulatory bodies including the CDC and federal and state organizations. These policies and algorithms were followed during the patient's care in the ED.    Patient is a 65 year old who comes in hypotensive and tachycardic with coffee-ground emesis.  Rectal exam was positive Hemoccult was brown stool.  Denies any alcohol use or liver issues to suggest this being varices.  Will start on Protonix for concern for ulcer.  Initially patient did not have any abdominal pain  but when I reevaluated him he did have some abdominal pain and he has a history of aneurysm therefore will get CT scan to make sure no evidence of rupture.  Given patient does have a history of blood clots and is tachycardic and slightly hypoxic will get a CT PE as well.  Due to the hypotension patient was immediately given 1 L of fluid.  Blood pressure is worse increasing and hemoglobin came back as normal.  Patient given a second liter of fluid.  Patient given multiple doses of antiemetics.  Patient has finally stopped vomiting.  CT scan show concern for possible aspiration as well as esophagitis.  Will discuss possible team for admission       ____________________________________________   FINAL CLINICAL IMPRESSION(S) / ED DIAGNOSES   Final diagnoses:  Upper GI bleed  Hypotension, unspecified hypotension type  Acute respiratory failure with hypoxia ([place])      MEDICATIONS GIVEN DURING THIS VISIT:  Medications  pantoprazole (PROTONIX) 80 mg in sodium chloride 0.9 % 100 mL IVPB (80 mg Intravenous New Bag/Given 09/13/20 2151)  pantoprazole (PROTONIX) 80 mg in sodium chloride 0.9 % 100 mL (0.8 mg/mL) infusion (8 mg/hr Intravenous New Bag/Given 09/13/20 2155)  pantoprazole (PROTONIX) injection 40 mg (has no administration in time range)  ondansetron (ZOFRAN) injection 4 mg (4 mg Intravenous Given 09/13/20 1908)  metoCLOPramide (REGLAN) injection 10 mg (10 mg Intravenous Given 09/13/20 1904)  promethazine (PHENERGAN) 12.5 mg in sodium chloride 0.9 % 50 mL IVPB (0 mg Intravenous Stopped 09/13/20 2116)  sodium chloride 0.9 % bolus 1,000 mL (0 mLs Intravenous Stopped 09/13/20 2155)  iohexol (OMNIPAQUE) 350 MG/ML injection 125 mL (125 mLs Intravenous Contrast Given 09/13/20 2028)     ED Discharge Orders    None       Note:  This document was prepared using Dragon voice recognition software and may include unintentional dictation errors.   Vanessa [place], MD 09/13/20 2203

## 2020-09-13 NOTE — ED Notes (Signed)
Patient transported to CT 

## 2020-09-13 NOTE — H&P (Signed)
PATIENT NAME: Chad Combs    MR#:  [id number]  DATE OF BIRTH:  1955/07/07  DATE OF ADMISSION:  09/13/2020  PRIMARY CARE PHYSICIAN: Alvester Morin, MD   Patient is coming from: Home.  REQUESTING/REFERRING PHYSICIAN: Marjean Donna, MD  CHIEF COMPLAINT:   Chief Complaint  Patient presents with  . GI Problem    GI Bleed    HISTORY OF PRESENT ILLNESS:  Chad Combs is a 65 y.o. Caucasian male with medical history significant for multiple medical problems that are mentioned below, including COPD, CVA, PE, hypertension, tobacco abuse, peripheral vascular disease, depression, diastolic CHF and type 2 diabetes mellitus, who presented to the ER with acute onset of coffee-ground emesis which occurred about 4-5 times today.  He denied any melena or bright red bleeding per rectum.  He admits to epigastric abdominal pain with his vomiting.  No fever or chills.  No dysuria, oliguria or hematuria or flank pain.  He denies any chest pain or palpitations.  No headache or dizziness or blurred vision, presyncope or syncope.  No other bleeding diathesis. ED Course: Upon presentation to the ER heart rate was 111 with otherwise normal vital signs.  CMP was remarkable for blood glucose of 202 and a BUN of 26 with creatinine 1.25.  Lactic acid was 1.9.  CBC showed leukocytosis 16.4 with neutrophilia and macrocytosis.  Hemoglobin hematocrit were 15.7/47.8.  INR was 1.1 PT 13.6 with a PTT of 31.  Influenza antigens and COVID-19 PCR came back negative.  Urinalysis came back negative.  Rectal exam revealed brown stools with positive stool Hemoccult. EKG as reviewed by me : EKG showed sinus tachycardia with a rate of 111 with poor R wave progression and borderline right axis deviation with low voltage QRS. Imaging: Chest x-ray showed no acute cardiopulmonary disease.  Chest and abdomen CTA revealed the following: 1. Interval placement of an endoluminal stent graft traversing the distal  abdominal aortic aneurysm. No evidence of endoleak or complication. 2. Diffuse atherosclerosis, with mild narrowing at the origin of the celiac artery unchanged. Mild diffuse atheromatous plaque throughout the iliac limbs of the endoluminal stent graft. 3. Stable IVC filter.  NON-VASCULAR  1. Esophageal wall thickening consistent with esophagitis. 2. No evidence of active gastrointestinal bleeding. 3. Patchy left lower lobe airspace disease which may be inflammatory, infectious, or related to aspiration. 4. Cholelithiasis without cholecystitis.  The patient was given 4 mg of IV Zofran and 30 mg of IV Reglan, 80 mg of IV Protonix bolus followed by drip as well as 12.5 mg of IV Phenergan and 1 L bolus of IV normal saline.  The patient will be admitted to a medical monitored bed for further evaluation and management.  PAST MEDICAL HISTORY:   Past Medical History:  Diagnosis Date  . AAA (abdominal aortic aneurysm) ([place])    a. 08/2016 CTA: 3.1cm saccular aneurysm of distal Ao arch w/ penetrating atheromatous ulcer. 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 09/2017 CTA chest/abd/pelvis [facility]): 6.3cm infrarenal AAA, unchanged bilateral CIA aneurysms (R 3.5cm, L 2.8cm)-->pt refused intervention.  . Anemia   . Blind right eye   . Blind right eye   . Carotid arterial disease ([place])    a. 12/2016 CTA Neck: RICA 100%. LICA >17%; b. 09/9447 s/p trans aortic LICA revascularization and stent placement [facility]); c. 03/2017 Carotid U/S [facility]): No significant LICA stenosis, patent stent.  Marland Kitchen COPD (  chronic obstructive pulmonary disease) ([place])   . Depression   . Diabetes mellitus without complication ([place])   . Diastolic dysfunction    a. 08/2016 Echo: EF 60%, Gr1 DD, mild MR, mildly dil LA/RV. No cardiac source of emboli.  . Femoral DVT (deep venous thrombosis) ([place])    a. 08/2016 L Fem Vein DVT ->xarelto/IVC filter. Xarelto subsequently  d/c'd.  . Hemiparesis ([place])   . Hemiplegia ([place]) left  . History of pulmonary embolism    a. 08/2016 following CVA-->chronic xarelto, s/p IVC filter. Xarelto subsequently d/c'd.  Marland Kitchen History of right MCA stroke    a. 08/2016 MRI: R MCA infarct w/ occlusion of RICA & R MCA.   Marland Kitchen Hypertension   . Stroke ([place])   . Tobacco abuse     PAST SURGICAL HISTORY:   Past Surgical History:  Procedure Laterality Date  . CAROTID ENDARTERECTOMY Left   . CATARACT EXTRACTION W/PHACO Left 07/29/2020   Procedure: CATARACT EXTRACTION PHACO AND INTRAOCULAR LENS PLACEMENT (IOC) LEFT VISION BLUE 9.69 00:54.3;  Surgeon: Birder Robson, MD;  Location: [place];  Service: Ophthalmology;  Laterality: Left;  . COLONOSCOPY WITH PROPOFOL N/A 05/26/2020   Procedure: COLONOSCOPY WITH PROPOFOL;  Surgeon: Lesly Rubenstein, MD;  Location: [facility] ENDOSCOPY;  Service: Endoscopy;  Laterality: N/A;  . ENDOVASCULAR REPAIR/STENT GRAFT N/A 01/29/2020   Procedure: ENDOVASCULAR REPAIR/STENT GRAFT;  Surgeon: Katha Cabal, MD;  Location: [place] CV [facility];  Service: Cardiovascular;  Laterality: N/A;  . IVC FILTER INSERTION N/A 08/24/2016   Procedure: IVC Filter Insertion;  Surgeon: Katha Cabal, MD;  Location: [place] [facility];  Service: Cardiovascular;  Laterality: N/A;    SOCIAL HISTORY:   Social History   Tobacco Use  . Smoking status: Former Smoker    Packs/day: 1.50    Years: 45.00    Pack years: 67.50    Types: Cigarettes    Quit date: 08/30/2016    Years since quitting: 4.0  . Smokeless tobacco: Never Used  Substance Use Topics  . Alcohol use: Yes    Alcohol/week: 0.0 standard drinks    Comment: occasional (once every couple months)    FAMILY HISTORY:   Family History  Problem Relation Age of Onset  . Cancer Mother   . Heart disease Father     DRUG ALLERGIES:   Allergies  Allergen Reactions  . Bee Pollen Hives and Swelling    REVIEW OF SYSTEMS:   ROS As per history of  present illness. All pertinent systems were reviewed above. Constitutional, HEENT, cardiovascular, respiratory, GI, GU, musculoskeletal, neuro, psychiatric, endocrine, integumentary and hematologic systems were reviewed and are otherwise negative/unremarkable except for positive findings mentioned above in the HPI.   MEDICATIONS AT HOME:   Prior to Admission medications   Medication Sig Start Date End Date Taking? Authorizing Provider  acetaminophen (TYLENOL) 650 MG CR tablet Take 650 mg by mouth 3 (three) times daily.   Yes [provider]  amLODipine (NORVASC) 5 MG tablet Take 5 mg by mouth daily.   Yes [provider]  ARTIFICIAL TEARS 0.2-0.2-1 % SOLN Place 1 drop into the left eye 3 (three) times daily. 09/01/20  Yes [provider]  aspirin 81 MG chewable tablet Chew 81 mg by mouth daily.   Yes [provider]  atorvastatin (LIPITOR) 20 MG tablet Take 20 mg by mouth daily. 01/24/20  Yes [provider]  budesonide-formoterol (SYMBICORT) 80-4.5 MCG/ACT inhaler Inhale 2 puffs into the lungs every 12 (twelve) hours.  Yes [provider]  clopidogrel (PLAVIX) 75 MG tablet Take 75 mg by mouth daily.   Yes [provider]  divalproex (DEPAKOTE) 125 MG DR tablet Take 125 mg by mouth at bedtime. 01/24/20  Yes [provider]  docusate sodium (COLACE) 100 MG capsule Take 1 capsule (100 mg total) by mouth daily. 01/31/20  Yes Swayze, Ava, DO  Dulaglutide (TRULICITY) 1.5 EX/9.3ZJ SOPN Inject 1.5 mg into the skin once a week. Friday   Yes [provider]  ezetimibe (ZETIA) 10 MG tablet Take 1 tablet (10 mg total) by mouth daily at 6 PM. Patient taking differently: Take 10 mg by mouth daily. 10/01/16  Yes Love, Ivan Anchors, PA-C  gabapentin (NEURONTIN) 300 MG capsule Take 300 mg by mouth daily. 01/24/20  Yes [provider]  ketorolac (ACULAR) 0.4 % SOLN Place 1 drop into the left eye 2 (two) times daily. 09/01/20  Yes  [provider]  LEVEMIR FLEXTOUCH 100 UNIT/ML FlexPen Inject 18 Units into the skin at bedtime. 01/21/20  Yes [provider]  levETIRAcetam (KEPPRA) 250 MG tablet Take 250 mg by mouth 2 (two) times daily. 01/24/20  Yes [provider]  lisinopril (ZESTRIL) 40 MG tablet Take 40 mg by mouth daily. 01/24/20  Yes [provider]  melatonin 3 MG TABS tablet Take 3 mg by mouth at bedtime.   Yes [provider]  metoprolol tartrate (LOPRESSOR) 25 MG tablet Take 0.5 tablets (12.5 mg total) by mouth 2 (two) times daily. 10/06/16  Yes Hendricks Limes, MD  pantoprazole (PROTONIX) 40 MG tablet Take 40 mg by mouth 2 (two) times daily.  01/24/20  Yes [provider]  polyethylene glycol (MIRALAX / GLYCOLAX) 17 g packet Take 17 g by mouth daily.   Yes [provider]  potassium chloride SA (KLOR-CON) 20 MEQ tablet Take 20 mEq by mouth 2 (two) times daily. 01/23/20  Yes [provider]  prednisoLONE acetate (PRED FORTE) 1 % ophthalmic suspension Place 1 drop into the left eye 2 (two) times daily. 09/01/20  Yes [provider]  senna (SENOKOT) 8.6 MG TABS tablet Take 2 tablets by mouth at bedtime.   Yes [provider]  sertraline (ZOLOFT) 100 MG tablet Take 100 mg by mouth daily. 01/24/20  Yes [provider]  simethicone (MYLICON) 696 MG chewable tablet Chew 125 mg by mouth 3 (three) times daily.   Yes [provider]  tamsulosin (FLOMAX) 0.4 MG CAPS capsule Take 1 capsule (0.4 mg total) by mouth daily after supper. 10/01/16  Yes Love, Ivan Anchors, PA-C  ondansetron (ZOFRAN) 4 MG tablet Take 4 mg by mouth at bedtime. 01/22/20   [provider]  OZEMPIC, 0.25 OR 0.5 MG/DOSE, 2 MG/1.5ML SOPN Inject 0.5 mg into the skin once a week. Friday Patient not taking: Reported on 07/22/2020 01/04/20   [provider]      VITAL SIGNS:  Blood pressure 122/72, pulse (!) 106, temperature 98.7 F (37.1 C), temperature  source Oral, resp. rate 19, height 5\' 9"  (1.753 m), weight 82.1 kg, SpO2 96 %.  PHYSICAL EXAMINATION:  Physical Exam  GENERAL:  65 y.o.-year-old Caucasian male patient lying in the bed with no acute distress.  EYES: Pupils equal, round, reactive to light and accommodation. No scleral icterus. Extraocular muscles intact.  HEENT: Head atraumatic, normocephalic. Oropharynx and nasopharynx clear.  NECK:  Supple, no jugular venous distention. No thyroid enlargement, no tenderness.  LUNGS: Normal breath sounds bilaterally, no wheezing, rales,rhonchi or  crepitation. No use of accessory muscles of respiration.  CARDIOVASCULAR: Regular rate and rhythm, S1, S2 normal. No murmurs, rubs, or gallops.  ABDOMEN: Soft, nondistended, with mild epigastric tenderness without rebound tenderness, guarding or rigidity.  Bowel sounds present. No organomegaly or mass.  EXTREMITIES: No pedal edema, cyanosis, or clubbing.  NEUROLOGIC: Cranial nerves II through XII are intact. Muscle strength 5/5 in all extremities. Sensation intact. Gait not checked.  PSYCHIATRIC: The patient is alert and oriented x 3.  Normal affect and good eye contact. SKIN: No obvious rash, lesion, or ulcer.   LABORATORY PANEL:   CBC Recent Labs  Lab 09/13/20 1859  WBC 16.4*  HGB 15.7  HCT 47.8  PLT 242   ------------------------------------------------------------------------------------------------------------------  Chemistries  Recent Labs  Lab 09/13/20 1859  NA 137  K 4.4  CL 105  CO2 21*  GLUCOSE 202*  BUN 26*  CREATININE 1.25*  CALCIUM 9.0  AST 16  ALT 16  ALKPHOS 71  BILITOT 1.0   ------------------------------------------------------------------------------------------------------------------  Cardiac Enzymes No results for input(s): TROPONINI in the last 168 hours. ------------------------------------------------------------------------------------------------------------------  RADIOLOGY:  CT Angio Chest PE  W and/or Wo Contrast  Result Date: 09/13/2020 CLINICAL DATA:  Hematemesis, shortness of breath EXAM: CT ANGIOGRAPHY CHEST WITH CONTRAST TECHNIQUE: Multidetector CT imaging of the chest was performed using the standard protocol during bolus administration of intravenous contrast. Multiplanar CT image reconstructions and MIPs were obtained to evaluate the vascular anatomy. CONTRAST:  162mL OMNIPAQUE IOHEXOL 350 MG/ML SOLN COMPARISON:  09/13/2020 FINDINGS: Cardiovascular: This is a technically adequate evaluation of the pulmonary vasculature. No filling defects or pulmonary emboli. Stable focal dilation of the aortic arch just distal to the takeoff of the left subclavian artery, measuring up to 3.3 cm, not appreciably changed. No evidence of thoracic aortic aneurysm or dissection. The heart is unremarkable without pericardial effusion. Extensive atherosclerosis thin the coronary vasculature, greatest in the LAD distribution. Mediastinum/Nodes: There is diffuse esophageal wall thickening, consistent with esophagitis. Fluid-filled esophagus and stomach are noted. The thyroid and trachea are unremarkable.  No pathologic adenopathy. Lungs/Pleura: Upper lobe predominant emphysema. Patchy airspace disease within the left lower lobe may be inflammatory, infectious, or related to aspiration. No effusion or pneumothorax. Upper Abdomen: No acute abnormality. Musculoskeletal: No acute or destructive bony lesions. Reconstructed images demonstrate no additional findings. Review of the MIP images confirms the above findings. IMPRESSION: 1. No evidence of pulmonary embolus. 2. Diffuse esophageal wall thickening consistent with esophagitis. 3. Fluid-filled stomach and esophagus may be related to history of vomiting. 4. Stable focal dilation of the distal aortic arch, not appreciably changed since prior study. No aneurysm or dissection. 5. Patchy consolidation left lower lobe may be inflammatory, infectious, or related to aspiration.  Electronically Signed   By: Randa Ngo M.D.   On: 09/13/2020 20:58   DG Chest Portable 1 View  Result Date: 09/13/2020 CLINICAL DATA:  Hematemesis, shortness of breath EXAM: PORTABLE CHEST 1 VIEW COMPARISON:  06/02/2020 FINDINGS: Single frontal view of the chest demonstrates an unremarkable cardiac silhouette. No airspace disease, effusion, or pneumothorax. No acute bony abnormalities. IMPRESSION: 1. No acute intrathoracic process. Electronically Signed   By: Randa Ngo M.D.   On: 09/13/2020 19:30   CT Angio Abd/Pel W and/or Wo Contrast  Result Date: 09/13/2020 CLINICAL DATA:  Hematemesis, gastrointestinal bleeding EXAM: CTA ABDOMEN AND PELVIS WITHOUT AND WITH CONTRAST TECHNIQUE: Multidetector CT imaging of the abdomen and pelvis was performed using the standard protocol during bolus administration of intravenous contrast. Multiplanar reconstructed images  and MIPs were obtained and reviewed to evaluate the vascular anatomy. CONTRAST:  154mL OMNIPAQUE IOHEXOL 350 MG/ML SOLN COMPARISON:  01/24/2020 FINDINGS: VASCULAR Aorta: Interval endoluminal stent graft spanning the distal abdominal aortic aneurysm and right common iliac artery aneurysm seen previously. Outer dimensions of the aneurysm sac now measure 5.2 x 7.3 cm, previously measuring 5.2 x 7.8 cm. There is no evidence of endoleak. Calcifications are seen within the excluded lumen of the aneurysm. The endoluminal stent graft is patent, with mild atheromatous plaque seen within the bilateral iliac limbs. Celiac: Mild stenosis at the origin of the celiac artery unchanged, less than 50%. No aneurysm or dissection. SMA: Patent without evidence of aneurysm, dissection, vasculitis or significant stenosis. Renals: Both renal arteries are patent without evidence of aneurysm, dissection, vasculitis, fibromuscular dysplasia or significant stenosis. IMA: The IMA does not opacified its origin, which arises from the excluded aneurysm sac. Distal reconstitution  via collaterals from the SMA. Inflow: There is mild stenosis of the bilateral external iliac arteries, without focal stenosis. Proximal Outflow: Bilateral common femoral and visualized portions of the superficial and profunda femoral arteries are patent without evidence of aneurysm, dissection, vasculitis or significant stenosis. Veins: IVC filter identified.  No other focal abnormalities. Review of the MIP images confirms the above findings. NON-VASCULAR Lower chest: Patchy consolidation left lower lobe consistent with airspace disease. This could be inflammatory, infectious, or related to aspiration. Hepatobiliary: Small gallstones are identified without cholecystitis. Liver is unremarkable. Pancreas: Unremarkable. No pancreatic ductal dilatation or surrounding inflammatory changes. Spleen: Normal in size without focal abnormality. Adrenals/Urinary Tract: Adrenal glands are unremarkable. Kidneys are normal, without renal calculi, focal lesion, or hydronephrosis. Bladder is unremarkable. Stomach/Bowel: No bowel obstruction or ileus. No bowel wall thickening or inflammatory change. Wall thickening of the distal thoracic esophagus consistent with esophagitis. No evidence of intraluminal contrast accumulation to suggest active gastrointestinal bleeding. Lymphatic: No pathologic adenopathy within the abdomen or pelvis. Reproductive: Prostate is unremarkable. Other: No free fluid or free gas.  No abdominal wall hernia. Musculoskeletal: Chronic compression deformities at T12 and L2. No acute bony abnormalities. Reconstructed images demonstrate no additional findings. IMPRESSION: VASCULAR 1. Interval placement of an endoluminal stent graft traversing the distal abdominal aortic aneurysm. No evidence of endoleak or complication. 2. Diffuse atherosclerosis, with mild narrowing at the origin of the celiac artery unchanged. Mild diffuse atheromatous plaque throughout the iliac limbs of the endoluminal stent graft. 3. Stable  IVC filter. NON-VASCULAR 1. Esophageal wall thickening consistent with esophagitis. 2. No evidence of active gastrointestinal bleeding. 3. Patchy left lower lobe airspace disease which may be inflammatory, infectious, or related to aspiration. 4. Cholelithiasis without cholecystitis. Electronically Signed   By: Randa Ngo M.D.   On: 09/13/2020 21:12      IMPRESSION AND PLAN:  Active Problems:   Upper GI bleed  1.  Upper GI bleeding.  Differential diagnosis would include acute esophagitis as seen on CT with Mallory-Weiss tear, acute gastritis duodenitis, gastric or esophageal or duodenal erosions or AVMs. -The patient will be admitted to a medical monitored bed. -We will follow serial hemoglobins and hematocrits. -We will hold off aspirin and Plavix. -We will continue IV Protonix drip. -We will continue hydration with IV normal saline. -GI consultation will be obtained. -I notified Dr. Allen Norris about the patient.  2.  Essential hypertension. -We will continue Norvas, Lopressor and Zestril c.  3.  Dyslipidemia. -We will continue statin therapy.  4.  Type II diabetes mellitus. -The patient will be placed on supplement  coverage with NovoLog and will continue his  basal coverage.  5.  Depression. -We will continue Zoloft.  6.  BPH. -We will continue Flomax.  DVT prophylaxis: SCDs.  Medical prophylaxis is currently contraindicated due to GI bleeding. Code Status: full code. Family Communication:  The plan of care was discussed in details with the patient (and family). I answered all questions. The patient agreed to proceed with the above mentioned plan. Further management will depend upon hospital course. Disposition Plan: Back to previous home environment Consults called: Gastroenterology consult as above. All the records are reviewed and case discussed with ED provider.  Status is: Inpatient  Remains inpatient appropriate because:Ongoing diagnostic testing needed not  appropriate for outpatient work up, Unsafe d/c plan, IV treatments appropriate due to intensity of illness or inability to take PO and Inpatient level of care appropriate due to severity of illness   Dispo: The patient is from: Home              Anticipated d/c is to: Home              Patient currently is not medically stable to d/c.   Difficult to place patient No   TOTAL TIME TAKING CARE OF THIS PATIENT: 55 minutes.    Christel Mormon M.D on 09/13/2020 at 10:17 PM  [facility]   From 7 PM-7 AM, contact night-coverage [email or web address]  CC: Primary care physician; Alvester Morin, MD

## 2020-09-13 NOTE — ED Notes (Signed)
Pt back from CT

## 2020-09-13 NOTE — ED Notes (Signed)
Message sent to receiving nurse on the floor

## 2020-09-13 NOTE — ED Notes (Signed)
ED Provider at bedside. 

## 2020-09-13 NOTE — ED Notes (Signed)
Pt transported via ED tech

## 2020-09-14 ENCOUNTER — Encounter: Payer: Self-pay | Admitting: Family Medicine

## 2020-09-14 LAB — GLUCOSE, CAPILLARY
Glucose-Capillary: 120 mg/dL — ABNORMAL HIGH (ref 70–99)
Glucose-Capillary: 100 mg/dL — ABNORMAL HIGH (ref 70–99)
Glucose-Capillary: 105 mg/dL — ABNORMAL HIGH (ref 70–99)
Glucose-Capillary: 87 mg/dL (ref 70–99)

## 2020-09-14 LAB — COMPREHENSIVE METABOLIC PANEL
ALT: 14 U/L (ref 0–44)
AST: 14 U/L — ABNORMAL LOW (ref 15–41)
Albumin: 3 g/dL — ABNORMAL LOW (ref 3.5–5.0)
Alkaline Phosphatase: 60 U/L (ref 38–126)
Anion gap: 7 (ref 5–15)
BUN: 23 mg/dL (ref 8–23)
CO2: 21 mmol/L — ABNORMAL LOW (ref 22–32)
Calcium: 8.4 mg/dL — ABNORMAL LOW (ref 8.9–10.3)
Chloride: 108 mmol/L (ref 98–111)
Creatinine, Ser: 0.95 mg/dL (ref 0.61–1.24)
GFR, Estimated: >60 mL/min (ref >60)
Glucose, Bld: 149 mg/dL — ABNORMAL HIGH (ref 70–99)
Potassium: 4.1 mmol/L (ref 3.5–5.1)
Sodium: 136 mmol/L (ref 135–145)
Total Bilirubin: 0.8 mg/dL (ref 0.3–1.2)
Total Protein: 6.1 g/dL — ABNORMAL LOW (ref 6.5–8.1)

## 2020-09-14 LAB — CBC
HCT: 40.2 % (ref 39.0–52.0)
Hemoglobin: 13 g/dL (ref 13.0–17.0)
MCH: 33.2 pg (ref 26.0–34.0)
MCHC: 32.3 g/dL (ref 30.0–36.0)
MCV: 102.6 fL — ABNORMAL HIGH (ref 80.0–100.0)
Platelets: 186 10*3/uL (ref 150–400)
RBC: 3.92 MIL/uL — ABNORMAL LOW (ref 4.22–5.81)
RDW: 12.8 % (ref 11.5–15.5)
WBC: 12.4 10*3/uL — ABNORMAL HIGH (ref 4.0–10.5)
nRBC: 0 % (ref 0.0–0.2)

## 2020-09-14 LAB — HEMOGLOBIN A1C
Hgb A1c MFr Bld: 6.3 % — ABNORMAL HIGH (ref 4.8–5.6)
Mean Plasma Glucose: 134.11 mg/dL

## 2020-09-14 MED ORDER — SODIUM CHLORIDE 0.9 % IV SOLN
250.0000 mg | Freq: Two times a day (BID) | INTRAVENOUS | Status: DC
  Administered 2020-09-14 – 2020-09-16 (×5): 250 mg via INTRAVENOUS
  Filled 2020-09-14 (×6): qty 2.5

## 2020-09-14 NOTE — Plan of Care (Signed)
  Problem: Clinical Measurements: Goal: Diagnostic test results will improve Outcome: Progressing Goal: Respiratory complications will improve 09/14/2020 1620 by Ruthy Dick D, RN Outcome: Progressing 09/14/2020 1619 by Ruthy Dick D, RN Outcome: Progressing Goal: Cardiovascular complication will be avoided Outcome: Progressing   Problem: Nutrition: Goal: Adequate nutrition will be maintained Outcome: Progressing

## 2020-09-14 NOTE — Consult Note (Signed)
Lucilla Lame, MD [address] [place], [place] [address] Phone: [phone] Fax : [phone]  Consultation  Referring Provider:     Dr. Tyrell Antonio Primary Care Physician:  Alvester Morin, MD Primary Gastroenterologist:  Dr. Haig Prophet         Reason for Consultation:     Upper GI bleed  Date of Admission:  09/13/2020 Date of Consultation:  09/14/2020         HPI:   Chad Combs is a 65 y.o. male who has seen Dr. Haig Prophet in the past for a CT scan showing colitis.  The patient had a colonoscopy back in December 2021 with multiple polyps removed that were tubular adenomas.  The patient was now admitted with hematemesis.  The patient had a CT scan showing thickening of the distal esophagus consistent with esophagitis.  The patient is quite tired and answers questions today with his eyes closed and mumbles.  I asked the patient if he wanted me to come back and speak later and he said no that he was awake.  He denies any further nausea or vomiting since being admitted.  The patient's hemoglobin has been stable and has shown hemoglobin of 13.0 today.  The patient also came in with an elevated white cell count of 16.4 that went down to 12.4 today.  There was a CT angiography that was negative for any acute bleeding.  The patient was also found to have cholelithiasis without colocystitis.     Past Medical History:  Diagnosis Date  . AAA (abdominal aortic aneurysm) ([place])    a. 08/2016 CTA: 3.1cm saccular aneurysm of distal Ao arch w/ penetrating atheromatous ulcer. 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 09/2017 CTA chest/abd/pelvis [facility]): 6.3cm infrarenal AAA, unchanged bilateral CIA aneurysms (R 3.5cm, L 2.8cm)-->pt refused intervention.  . Anemia   . Blind right eye   . Blind right eye   . Carotid arterial disease ([place])    a. 12/2016 CTA Neck: RICA 100%. LICA >13%; b. 0/8657 s/p trans aortic LICA  revascularization and stent placement [facility]); c. 03/2017 Carotid U/S [facility]): No significant LICA stenosis, patent stent.  Marland Kitchen COPD (chronic obstructive pulmonary disease) ([place])   . Depression   . Diabetes mellitus without complication ([place])   . Diastolic dysfunction    a. 08/2016 Echo: EF 60%, Gr1 DD, mild MR, mildly dil LA/RV. No cardiac source of emboli.  . Femoral DVT (deep venous thrombosis) ([place])    a. 08/2016 L Fem Vein DVT ->xarelto/IVC filter. Xarelto subsequently d/c'd.  . Hemiparesis ([place])   . Hemiplegia ([place]) left  . History of pulmonary embolism    a. 08/2016 following CVA-->chronic xarelto, s/p IVC filter. Xarelto subsequently d/c'd.  Marland Kitchen History of right MCA stroke    a. 08/2016 MRI: R MCA infarct w/ occlusion of RICA & R MCA.   Marland Kitchen Hypertension   . Stroke ([place])   . Tobacco abuse     Past Surgical History:  Procedure Laterality Date  . CAROTID ENDARTERECTOMY Left   . CATARACT EXTRACTION W/PHACO Left 07/29/2020   Procedure: CATARACT EXTRACTION PHACO AND INTRAOCULAR LENS PLACEMENT (IOC) LEFT VISION BLUE 9.69 00:54.3;  Surgeon: Birder Robson, MD;  Location: [place];  Service: Ophthalmology;  Laterality: Left;  . COLONOSCOPY WITH PROPOFOL N/A 05/26/2020   Procedure: COLONOSCOPY WITH PROPOFOL;  Surgeon: Lesly Rubenstein, MD;  Location: [facility] ENDOSCOPY;  Service: Endoscopy;  Laterality: N/A;  .  ENDOVASCULAR REPAIR/STENT GRAFT N/A 01/29/2020   Procedure: ENDOVASCULAR REPAIR/STENT GRAFT;  Surgeon: Katha Cabal, MD;  Location: [place] CV [facility];  Service: Cardiovascular;  Laterality: N/A;  . IVC FILTER INSERTION N/A 08/24/2016   Procedure: IVC Filter Insertion;  Surgeon: Katha Cabal, MD;  Location: [place] CV [facility];  Service: Cardiovascular;  Laterality: N/A;    Prior to Admission medications   Medication Sig Start Date End Date Taking? Authorizing Provider  acetaminophen (TYLENOL) 650 MG CR tablet Take 650 mg by mouth 3 (three) times daily.   Yes [provider]  amLODipine (NORVASC) 5 MG tablet Take 5 mg by mouth daily.   Yes [provider]  ARTIFICIAL TEARS 0.2-0.2-1 % SOLN Place 1 drop into the left eye 3 (three) times daily. 09/01/20  Yes [provider]  aspirin 81 MG chewable tablet Chew 81 mg by mouth daily.   Yes [provider]  atorvastatin (LIPITOR) 20 MG tablet Take 20 mg by mouth daily. 01/24/20  Yes [provider]  budesonide-formoterol (SYMBICORT) 80-4.5 MCG/ACT inhaler Inhale 2 puffs into the lungs every 12 (twelve) hours.   Yes [provider]  clopidogrel (PLAVIX) 75 MG tablet Take 75 mg by mouth daily.   Yes [provider]  divalproex (DEPAKOTE) 125 MG DR tablet Take 125 mg by mouth at bedtime. 01/24/20  Yes [provider]  docusate sodium (COLACE) 100 MG capsule Take 1 capsule (100 mg total) by mouth daily. 01/31/20  Yes Swayze, Ava, DO  Dulaglutide (TRULICITY) 1.5 HU/7.6LY SOPN Inject 1.5 mg into the skin once a week. Friday   Yes [provider]  ezetimibe (ZETIA) 10 MG tablet Take 1 tablet (10 mg total) by mouth daily at 6 PM. Patient taking differently: Take 10 mg by mouth daily. 10/01/16  Yes Love, Ivan Anchors, PA-C  gabapentin (NEURONTIN) 300 MG capsule Take 300 mg by mouth daily. 01/24/20  Yes [provider]  ketorolac (ACULAR) 0.4 % SOLN Place 1 drop into the left eye 2 (two) times daily. 09/01/20  Yes [provider]  LEVEMIR FLEXTOUCH 100 UNIT/ML FlexPen Inject 18 Units into the skin at bedtime. 01/21/20  Yes [provider]  levETIRAcetam (KEPPRA) 250 MG tablet Take 250 mg by mouth 2 (two) times daily. 01/24/20  Yes [provider]  lisinopril (ZESTRIL) 40 MG tablet Take 40 mg by mouth daily. 01/24/20  Yes [provider]  melatonin 3 MG TABS tablet Take 3 mg by mouth at bedtime.   Yes [provider]  metoprolol tartrate (LOPRESSOR) 25 MG tablet Take 0.5 tablets (12.5 mg total) by mouth 2 (two) times  daily. 10/06/16  Yes Hendricks Limes, MD  pantoprazole (PROTONIX) 40 MG tablet Take 40 mg by mouth 2 (two) times daily.  01/24/20  Yes [provider]  polyethylene glycol (MIRALAX / GLYCOLAX) 17 g packet Take 17 g by mouth daily.   Yes [provider]  potassium chloride SA (KLOR-CON) 20 MEQ tablet Take 20 mEq by mouth 2 (two) times daily. 01/23/20  Yes [provider]  prednisoLONE acetate (PRED FORTE) 1 % ophthalmic suspension Place 1 drop into the left eye 2 (two) times daily. 09/01/20  Yes [provider]  senna (SENOKOT) 8.6 MG TABS tablet Take 2 tablets by mouth at bedtime.   Yes [provider]  sertraline (ZOLOFT) 100 MG tablet Take 100 mg by mouth daily. 01/24/20  Yes [provider]  simethicone (MYLICON) 650 MG chewable tablet Chew  125 mg by mouth 3 (three) times daily.   Yes [provider]  tamsulosin (FLOMAX) 0.4 MG CAPS capsule Take 1 capsule (0.4 mg total) by mouth daily after supper. 10/01/16  Yes Love, Ivan Anchors, PA-C  ondansetron (ZOFRAN) 4 MG tablet Take 4 mg by mouth at bedtime. 01/22/20   [provider]  OZEMPIC, 0.25 OR 0.5 MG/DOSE, 2 MG/1.5ML SOPN Inject 0.5 mg into the skin once a week. Friday Patient not taking: Reported on 07/22/2020 01/04/20   [provider]    Family History  Problem Relation Age of Onset  . Cancer Mother   . Heart disease Father      Social History   Tobacco Use  . Smoking status: Former Smoker    Packs/day: 1.50    Years: 45.00    Pack years: 67.50    Types: Cigarettes    Quit date: 08/30/2016    Years since quitting: 4.0  . Smokeless tobacco: Never Used  Vaping Use  . Vaping Use: Never used  Substance Use Topics  . Alcohol use: Yes    Alcohol/week: 0.0 standard drinks    Comment: occasional (once every couple months)  . Drug use: No    Allergies as of 09/13/2020 - Review Complete 09/13/2020  Allergen Reaction Noted  . Bee pollen Hives and Swelling 01/24/2020     Review of Systems:    All systems reviewed and negative except where noted in HPI.   Physical Exam:  Vital signs in last 24 hours: Temp:  [98.2 F (36.8 C)-98.7 F (37.1 C)] 98.4 F (36.9 C) (03/27 0902) Pulse Rate:  [89-111] 89 (03/27 0902) Resp:  [16-20] 18 (03/27 0902) BP: (82-142)/(50-84) 134/76 (03/27 0902) SpO2:  [90 %-100 %] 96 % (03/27 0902) Weight:  [82.1 kg-84.4 kg] 84.4 kg (03/26 2252)   General:   Pleasant, cooperative in NAD Head:  Normocephalic and atraumatic. Eyes:   No icterus.   Conjunctiva pink. PERRLA. Ears:  Normal auditory acuity. Neck:  Supple; no masses or thyroidomegaly Lungs: Respirations even and unlabored. Lungs clear to auscultation bilaterally.   No wheezes, crackles, or rhonchi.  Heart:  Regular rate and rhythm;  Without murmur, clicks, rubs or gallops Abdomen:  Soft, nondistended, nontender. Normal bowel sounds. No appreciable masses or hepatomegaly.  No rebound or guarding.  Rectal:  Not performed. Msk:  Symmetrical without gross deformities.    Extremities:  Without edema, cyanosis or clubbing. Neurologic:  Alert and oriented x3;  grossly normal neurologically. Skin:  Intact without significant lesions or rashes. Cervical Nodes:  No significant cervical adenopathy. Psych:  Alert and cooperative. Normal affect.  LAB RESULTS: Recent Labs    09/13/20 1859 09/14/20 0417  WBC 16.4* 12.4*  HGB 15.7 13.0  HCT 47.8 40.2  PLT 242 186   BMET Recent Labs    09/13/20 1859 09/14/20 0417  NA 137 136  K 4.4 4.1  CL 105 108  CO2 21* 21*  GLUCOSE 202* 149*  BUN 26* 23  CREATININE 1.25* 0.95  CALCIUM 9.0 8.4*   LFT Recent Labs    09/14/20 0417  PROT 6.1*  ALBUMIN 3.0*  AST 14*  ALT 14  ALKPHOS 60  BILITOT 0.8   PT/INR Recent Labs    09/13/20 1859  LABPROT 13.6  INR 1.1    STUDIES: CT Angio Chest PE W and/or Wo Contrast  Result Date: 09/13/2020 CLINICAL DATA:  Hematemesis, shortness of breath EXAM: CT ANGIOGRAPHY CHEST  WITH CONTRAST TECHNIQUE: Multidetector CT imaging of  the chest was performed using the standard protocol during bolus administration of intravenous contrast. Multiplanar CT image reconstructions and MIPs were obtained to evaluate the vascular anatomy. CONTRAST:  175mL OMNIPAQUE IOHEXOL 350 MG/ML SOLN COMPARISON:  09/13/2020 FINDINGS: Cardiovascular: This is a technically adequate evaluation of the pulmonary vasculature. No filling defects or pulmonary emboli. Stable focal dilation of the aortic arch just distal to the takeoff of the left subclavian artery, measuring up to 3.3 cm, not appreciably changed. No evidence of thoracic aortic aneurysm or dissection. The heart is unremarkable without pericardial effusion. Extensive atherosclerosis thin the coronary vasculature, greatest in the LAD distribution. Mediastinum/Nodes: There is diffuse esophageal wall thickening, consistent with esophagitis. Fluid-filled esophagus and stomach are noted. The thyroid and trachea are unremarkable.  No pathologic adenopathy. Lungs/Pleura: Upper lobe predominant emphysema. Patchy airspace disease within the left lower lobe may be inflammatory, infectious, or related to aspiration. No effusion or pneumothorax. Upper Abdomen: No acute abnormality. Musculoskeletal: No acute or destructive bony lesions. Reconstructed images demonstrate no additional findings. Review of the MIP images confirms the above findings. IMPRESSION: 1. No evidence of pulmonary embolus. 2. Diffuse esophageal wall thickening consistent with esophagitis. 3. Fluid-filled stomach and esophagus may be related to history of vomiting. 4. Stable focal dilation of the distal aortic arch, not appreciably changed since prior study. No aneurysm or dissection. 5. Patchy consolidation left lower lobe may be inflammatory, infectious, or related to aspiration. Electronically Signed   By: Randa Ngo M.D.   On: 09/13/2020 20:58   DG Chest Portable 1 View  Result Date:  09/13/2020 CLINICAL DATA:  Hematemesis, shortness of breath EXAM: PORTABLE CHEST 1 VIEW COMPARISON:  06/02/2020 FINDINGS: Single frontal view of the chest demonstrates an unremarkable cardiac silhouette. No airspace disease, effusion, or pneumothorax. No acute bony abnormalities. IMPRESSION: 1. No acute intrathoracic process. Electronically Signed   By: Randa Ngo M.D.   On: 09/13/2020 19:30   CT Angio Abd/Pel W and/or Wo Contrast  Result Date: 09/13/2020 CLINICAL DATA:  Hematemesis, gastrointestinal bleeding EXAM: CTA ABDOMEN AND PELVIS WITHOUT AND WITH CONTRAST TECHNIQUE: Multidetector CT imaging of the abdomen and pelvis was performed using the standard protocol during bolus administration of intravenous contrast. Multiplanar reconstructed images and MIPs were obtained and reviewed to evaluate the vascular anatomy. CONTRAST:  15mL OMNIPAQUE IOHEXOL 350 MG/ML SOLN COMPARISON:  01/24/2020 FINDINGS: VASCULAR Aorta: Interval endoluminal stent graft spanning the distal abdominal aortic aneurysm and right common iliac artery aneurysm seen previously. Outer dimensions of the aneurysm sac now measure 5.2 x 7.3 cm, previously measuring 5.2 x 7.8 cm. There is no evidence of endoleak. Calcifications are seen within the excluded lumen of the aneurysm. The endoluminal stent graft is patent, with mild atheromatous plaque seen within the bilateral iliac limbs. Celiac: Mild stenosis at the origin of the celiac artery unchanged, less than 50%. No aneurysm or dissection. SMA: Patent without evidence of aneurysm, dissection, vasculitis or significant stenosis. Renals: Both renal arteries are patent without evidence of aneurysm, dissection, vasculitis, fibromuscular dysplasia or significant stenosis. IMA: The IMA does not opacified its origin, which arises from the excluded aneurysm sac. Distal reconstitution via collaterals from the SMA. Inflow: There is mild stenosis of the bilateral external iliac arteries, without focal  stenosis. Proximal Outflow: Bilateral common femoral and visualized portions of the superficial and profunda femoral arteries are patent without evidence of aneurysm, dissection, vasculitis or significant stenosis. Veins: IVC filter identified.  No other focal abnormalities. Review of the MIP images confirms the above findings.  NON-VASCULAR Lower chest: Patchy consolidation left lower lobe consistent with airspace disease. This could be inflammatory, infectious, or related to aspiration. Hepatobiliary: Small gallstones are identified without cholecystitis. Liver is unremarkable. Pancreas: Unremarkable. No pancreatic ductal dilatation or surrounding inflammatory changes. Spleen: Normal in size without focal abnormality. Adrenals/Urinary Tract: Adrenal glands are unremarkable. Kidneys are normal, without renal calculi, focal lesion, or hydronephrosis. Bladder is unremarkable. Stomach/Bowel: No bowel obstruction or ileus. No bowel wall thickening or inflammatory change. Wall thickening of the distal thoracic esophagus consistent with esophagitis. No evidence of intraluminal contrast accumulation to suggest active gastrointestinal bleeding. Lymphatic: No pathologic adenopathy within the abdomen or pelvis. Reproductive: Prostate is unremarkable. Other: No free fluid or free gas.  No abdominal wall hernia. Musculoskeletal: Chronic compression deformities at T12 and L2. No acute bony abnormalities. Reconstructed images demonstrate no additional findings. IMPRESSION: VASCULAR 1. Interval placement of an endoluminal stent graft traversing the distal abdominal aortic aneurysm. No evidence of endoleak or complication. 2. Diffuse atherosclerosis, with mild narrowing at the origin of the celiac artery unchanged. Mild diffuse atheromatous plaque throughout the iliac limbs of the endoluminal stent graft. 3. Stable IVC filter. NON-VASCULAR 1. Esophageal wall thickening consistent with esophagitis. 2. No evidence of active  gastrointestinal bleeding. 3. Patchy left lower lobe airspace disease which may be inflammatory, infectious, or related to aspiration. 4. Cholelithiasis without cholecystitis. Electronically Signed   By: Randa Ngo M.D.   On: 09/13/2020 21:12      Impression / Plan:   Assessment: Active Problems:   Upper GI bleed   Chad Combs is a 65 y.o. y/o male with who was admitted with hematemesis.  The hematemesis is stopped in the CT scan which was done for a possible source of the bleeding showed esophageal wall thickening consistent with esophagitis.  The patient's hemoglobin has been normal since admission.  Plan: I would recommend treating this patient with a PPI twice a day for his esophagitis and would recommend an upper endoscopy to rule out any esophageal ulcerations versus possible candidal esophagitis as the cause of his abnormal CT scan.  The patient will be set up for an EGD tomorrow with Dr. Vicente Males.  Thank you for involving me in the care of this patient.      LOS: 1 day   Lucilla Lame, MD, [facility] 09/14/2020, 11:21 AM,  Pager [phone] 7am-5pm  Check AMION for 5pm -7am coverage and on weekends   Note: This dictation was prepared with Dragon dictation along with smaller phrase technology. Any transcriptional errors that result from this process are unintentional.

## 2020-09-14 NOTE — Progress Notes (Addendum)
PROGRESS NOTE    Chad Combs  OEU:[id number] DOB: July 04, 1955 DOA: 09/13/2020 PCP: Alvester Morin, MD   Brief Narrative: 65 year old with past medical history significant for bd,cava,be, hypertension, tobacco abuse, peripheral vascular disease, diastolic heart failure, diabetes type 2 who present to the ER with acute onset of coffee-ground emesis, after multiple episodes 4-5 times the day of admission.  She denies melena or bright red blood per rectum.  He is complaining of abdominal pain.  Evaluation in the ED patient was noted to be tachycardic, creatinine 1.2 BUN 26, lactic acid 1.9.  Rectal exam revealed brown stool with positive stool Hemoccult. CT chest abdomen CTA: Interval placement of an endoluminal stent graft transversing the distal abdominal aortic aneurysm.  No evidence of endoleak or complications.  Diffuse atherosclerosis, with mild narrowing at the origin of the celiac artery unchanged.  Mild diffuse atheromatous plaque throughout the iliac limbs of the endoluminal stent graft.  Stable IVC filter.  Esophageal wall thickening consistent with esophagitis, patchy left lower lobe airspace disease which may be inflammatory, infectious or related to aspiration.  Cholelithiasis without cholecystitis.  Patient was a started on Protonix drip.  He received Zofran, Phenergan and IV fluids.  Patient admitted for coffee-ground emesis.   Assessment & Plan:   Active Problems:   Upper GI bleed   1-Upper GI bleed: Differential acute esophagitis, versus Mallory-Weiss tear, acute gastritis versus duodenitis. -Continue with IV fluids, IV Protonix drip. -Agree with holding aspirin and Plavix. -GI has been consulted, Dr. Allen Norris.  -Monitor hemoglobin, hemoglobin at 13. Hb was 16 on admission, drop likely to hemoconcentration, mild component of GI bleed.  -Plan for endoscopy tomorrow.   2-Essential Hypertension: -Hold Norvasc and lisinopril in the setting of vomiting and possible GI  bleed. -We will continue with metoprolol (to avoid rebound tachycardia)  low-dose will add holding parameters for blood pressure.   3-Dyslipidemia: Continue  with statins.  Type 2 Diabetes: Hold Delaglutide.  Hold Lantus, while he is NPO.  Continue with SSI.  Hb A1c 6.3.   History of Seizure;  Will change keppra to IV>   Depression: Continue with Zoloft BPH: Continue with Flomax     Estimated body mass index is 27.48 kg/m as calculated from the following:   Height as of this encounter: 5\' 9"  (1.753 m).   Weight as of this encounter: 84.4 kg.   DVT prophylaxis: SCDs Code Status: Full code Family Communication: Son Updated.  Disposition Plan:  Status is: Inpatient  Remains inpatient appropriate because:IV treatments appropriate due to intensity of illness or inability to take PO   Dispo: The patient is from: Home              Anticipated d/c is to: Home              Patient currently is not medically stable to d/c.   Difficult to place patient No        Consultants:   GI  Procedures:     Antimicrobials:    Subjective: He is sleepy, keep eyes close, feels tired. He is able to answer questions. He denies abdominal pain, he tells me last night was last episode of vomiting. He doesn't drink alcohol. He gave me permission to call his son for updates.   Objective: Vitals:   09/13/20 2130 09/13/20 2230 09/13/20 2252 09/14/20 0455  BP: 122/72 126/83 (!) 142/84 119/80  Pulse: (!) 106 (!) 108 (!) 106 98  Resp:   20 16  Temp:  98.2 F (36.8 C) 98.6 F (37 C)  TempSrc:   Oral Oral  SpO2: 96% 95% 95% 100%  Weight:   84.4 kg   Height:   5\' 9"  (1.753 m)     Intake/Output Summary (Last 24 hours) at 09/14/2020 0723 Last data filed at 09/14/2020 0500 Gross per 24 hour  Intake 1100 ml  Output 700 ml  Net 400 ml   Filed Weights   09/13/20 1906 09/13/20 2252  Weight: 82.1 kg 84.4 kg    Examination:  General exam: Appears calm and comfortable   Respiratory system: Clear to auscultation. Respiratory effort normal. Cardiovascular system: S1 & S2 heard, RRR.  Gastrointestinal system: Abdomen is nondistended, soft and nontender.  Central nervous system: Alert and oriented. No focal neurological deficits. Extremities: Symmetric 5 x 5 power.   Data Reviewed: I have personally reviewed following labs and imaging studies  CBC: Recent Labs  Lab 09/13/20 1859 09/14/20 0417  WBC 16.4* 12.4*  NEUTROABS 12.7*  --   HGB 15.7 13.0  HCT 47.8 40.2  MCV 101.7* 102.6*  PLT 242 767   Basic Metabolic Panel: Recent Labs  Lab 09/13/20 1859 09/14/20 0417  NA 137 136  K 4.4 4.1  CL 105 108  CO2 21* 21*  GLUCOSE 202* 149*  BUN 26* 23  CREATININE 1.25* 0.95  CALCIUM 9.0 8.4*   GFR: Estimated Creatinine Clearance: 77.5 mL/min (by C-G formula based on SCr of 0.95 mg/dL). Liver Function Tests: Recent Labs  Lab 09/13/20 1859 09/14/20 0417  AST 16 14*  ALT 16 14  ALKPHOS 71 60  BILITOT 1.0 0.8  PROT 7.6 6.1*  ALBUMIN 3.7 3.0*   Recent Labs  Lab 09/13/20 1859  LIPASE 32   No results for input(s): AMMONIA in the last 168 hours. Coagulation Profile: Recent Labs  Lab 09/13/20 1859  INR 1.1   Cardiac Enzymes: No results for input(s): CKTOTAL, CKMB, CKMBINDEX, TROPONINI in the last 168 hours. BNP (last 3 results) No results for input(s): PROBNP in the last 8760 hours. HbA1C: Recent Labs    09/13/20 2322  HGBA1C 6.3*   CBG: Recent Labs  Lab 09/13/20 2249  GLUCAP 147*   Lipid Profile: No results for input(s): CHOL, HDL, LDLCALC, TRIG, CHOLHDL, LDLDIRECT in the last 72 hours. Thyroid Function Tests: No results for input(s): TSH, T4TOTAL, FREET4, T3FREE, THYROIDAB in the last 72 hours. Anemia Panel: No results for input(s): VITAMINB12, FOLATE, FERRITIN, TIBC, IRON, RETICCTPCT in the last 72 hours. Sepsis Labs: Recent Labs  Lab 09/13/20 1902  LATICACIDVEN 1.9    Recent Results (from the past 240 hour(s))   Resp Panel by RT-PCR (Flu A&B, Covid) Nasopharyngeal Swab     Status: None   Collection Time: 09/13/20  7:02 PM   Specimen: Nasopharyngeal Swab; Nasopharyngeal(NP) swabs in vial transport medium  Result Value Ref Range Status   SARS Coronavirus 2 by RT PCR NEGATIVE NEGATIVE Final    Comment: (NOTE) SARS-CoV-2 target nucleic acids are NOT DETECTED.  The SARS-CoV-2 RNA is generally detectable in upper respiratory specimens during the acute phase of infection. The lowest concentration of SARS-CoV-2 viral copies this assay can detect is 138 copies/mL. A negative result does not preclude SARS-Cov-2 infection and should not be used as the sole basis for treatment or other patient management decisions. A negative result may occur with  improper specimen collection/handling, submission of specimen other than nasopharyngeal swab, presence of viral mutation(s) within the areas targeted by this assay, and inadequate number of viral  copies(<138 copies/mL). A negative result must be combined with clinical observations, patient history, and epidemiological information. The expected result is Negative.  Fact Sheet for Patients:  EntrepreneurPulse.com.au  Fact Sheet for Healthcare Providers:  IncredibleEmployment.be  This test is no t yet approved or cleared by the [place] FDA and  has been authorized for detection and/or diagnosis of SARS-CoV-2 by FDA under an Emergency Use Authorization (EUA). This EUA will remain  in effect (meaning this test can be used) for the duration of the COVID-19 declaration under Section 564(b)(1) of the Act, 21 U.S.C.section 360bbb-3(b)(1), unless the authorization is terminated  or revoked sooner.       Influenza A by PCR NEGATIVE NEGATIVE Final   Influenza B by PCR NEGATIVE NEGATIVE Final    Comment: (NOTE) The Xpert Xpress SARS-CoV-2/FLU/RSV plus assay is intended as an aid in the diagnosis of influenza from  Nasopharyngeal swab specimens and should not be used as a sole basis for treatment. Nasal washings and aspirates are unacceptable for Xpert Xpress SARS-CoV-2/FLU/RSV testing.  Fact Sheet for Patients: EntrepreneurPulse.com.au  Fact Sheet for Healthcare Providers: IncredibleEmployment.be  This test is not yet approved or cleared by the [place] FDA and has been authorized for detection and/or diagnosis of SARS-CoV-2 by FDA under an Emergency Use Authorization (EUA). This EUA will remain in effect (meaning this test can be used) for the duration of the COVID-19 declaration under Section 564(b)(1) of the Act, 21 U.S.C. section 360bbb-3(b)(1), unless the authorization is terminated or revoked.  Performed at [facility], [address] [place], [place] [address]          Radiology Studies: CT Angio Chest PE W and/or Wo Contrast  Result Date: 09/13/2020 CLINICAL DATA:  Hematemesis, shortness of breath EXAM: CT ANGIOGRAPHY CHEST WITH CONTRAST TECHNIQUE: Multidetector CT imaging of the chest was performed using the standard protocol during bolus administration of intravenous contrast. Multiplanar CT image reconstructions and MIPs were obtained to evaluate the vascular anatomy. CONTRAST:  140mL OMNIPAQUE IOHEXOL 350 MG/ML SOLN COMPARISON:  09/13/2020 FINDINGS: Cardiovascular: This is a technically adequate evaluation of the pulmonary vasculature. No filling defects or pulmonary emboli. Stable focal dilation of the aortic arch just distal to the takeoff of the left subclavian artery, measuring up to 3.3 cm, not appreciably changed. No evidence of thoracic aortic aneurysm or dissection. The heart is unremarkable without pericardial effusion. Extensive atherosclerosis thin the coronary vasculature, greatest in the LAD distribution. Mediastinum/Nodes: There is diffuse esophageal wall thickening, consistent with esophagitis. Fluid-filled esophagus  and stomach are noted. The thyroid and trachea are unremarkable.  No pathologic adenopathy. Lungs/Pleura: Upper lobe predominant emphysema. Patchy airspace disease within the left lower lobe may be inflammatory, infectious, or related to aspiration. No effusion or pneumothorax. Upper Abdomen: No acute abnormality. Musculoskeletal: No acute or destructive bony lesions. Reconstructed images demonstrate no additional findings. Review of the MIP images confirms the above findings. IMPRESSION: 1. No evidence of pulmonary embolus. 2. Diffuse esophageal wall thickening consistent with esophagitis. 3. Fluid-filled stomach and esophagus may be related to history of vomiting. 4. Stable focal dilation of the distal aortic arch, not appreciably changed since prior study. No aneurysm or dissection. 5. Patchy consolidation left lower lobe may be inflammatory, infectious, or related to aspiration. Electronically Signed   By: Randa Ngo M.D.   On: 09/13/2020 20:58   DG Chest Portable 1 View  Result Date: 09/13/2020 CLINICAL DATA:  Hematemesis, shortness of breath EXAM: PORTABLE CHEST 1 VIEW COMPARISON:  06/02/2020 FINDINGS:  Single frontal view of the chest demonstrates an unremarkable cardiac silhouette. No airspace disease, effusion, or pneumothorax. No acute bony abnormalities. IMPRESSION: 1. No acute intrathoracic process. Electronically Signed   By: Randa Ngo M.D.   On: 09/13/2020 19:30   CT Angio Abd/Pel W and/or Wo Contrast  Result Date: 09/13/2020 CLINICAL DATA:  Hematemesis, gastrointestinal bleeding EXAM: CTA ABDOMEN AND PELVIS WITHOUT AND WITH CONTRAST TECHNIQUE: Multidetector CT imaging of the abdomen and pelvis was performed using the standard protocol during bolus administration of intravenous contrast. Multiplanar reconstructed images and MIPs were obtained and reviewed to evaluate the vascular anatomy. CONTRAST:  11mL OMNIPAQUE IOHEXOL 350 MG/ML SOLN COMPARISON:  01/24/2020 FINDINGS: VASCULAR Aorta:  Interval endoluminal stent graft spanning the distal abdominal aortic aneurysm and right common iliac artery aneurysm seen previously. Outer dimensions of the aneurysm sac now measure 5.2 x 7.3 cm, previously measuring 5.2 x 7.8 cm. There is no evidence of endoleak. Calcifications are seen within the excluded lumen of the aneurysm. The endoluminal stent graft is patent, with mild atheromatous plaque seen within the bilateral iliac limbs. Celiac: Mild stenosis at the origin of the celiac artery unchanged, less than 50%. No aneurysm or dissection. SMA: Patent without evidence of aneurysm, dissection, vasculitis or significant stenosis. Renals: Both renal arteries are patent without evidence of aneurysm, dissection, vasculitis, fibromuscular dysplasia or significant stenosis. IMA: The IMA does not opacified its origin, which arises from the excluded aneurysm sac. Distal reconstitution via collaterals from the SMA. Inflow: There is mild stenosis of the bilateral external iliac arteries, without focal stenosis. Proximal Outflow: Bilateral common femoral and visualized portions of the superficial and profunda femoral arteries are patent without evidence of aneurysm, dissection, vasculitis or significant stenosis. Veins: IVC filter identified.  No other focal abnormalities. Review of the MIP images confirms the above findings. NON-VASCULAR Lower chest: Patchy consolidation left lower lobe consistent with airspace disease. This could be inflammatory, infectious, or related to aspiration. Hepatobiliary: Small gallstones are identified without cholecystitis. Liver is unremarkable. Pancreas: Unremarkable. No pancreatic ductal dilatation or surrounding inflammatory changes. Spleen: Normal in size without focal abnormality. Adrenals/Urinary Tract: Adrenal glands are unremarkable. Kidneys are normal, without renal calculi, focal lesion, or hydronephrosis. Bladder is unremarkable. Stomach/Bowel: No bowel obstruction or ileus. No  bowel wall thickening or inflammatory change. Wall thickening of the distal thoracic esophagus consistent with esophagitis. No evidence of intraluminal contrast accumulation to suggest active gastrointestinal bleeding. Lymphatic: No pathologic adenopathy within the abdomen or pelvis. Reproductive: Prostate is unremarkable. Other: No free fluid or free gas.  No abdominal wall hernia. Musculoskeletal: Chronic compression deformities at T12 and L2. No acute bony abnormalities. Reconstructed images demonstrate no additional findings. IMPRESSION: VASCULAR 1. Interval placement of an endoluminal stent graft traversing the distal abdominal aortic aneurysm. No evidence of endoleak or complication. 2. Diffuse atherosclerosis, with mild narrowing at the origin of the celiac artery unchanged. Mild diffuse atheromatous plaque throughout the iliac limbs of the endoluminal stent graft. 3. Stable IVC filter. NON-VASCULAR 1. Esophageal wall thickening consistent with esophagitis. 2. No evidence of active gastrointestinal bleeding. 3. Patchy left lower lobe airspace disease which may be inflammatory, infectious, or related to aspiration. 4. Cholelithiasis without cholecystitis. Electronically Signed   By: Randa Ngo M.D.   On: 09/13/2020 21:12        Scheduled Meds: . amLODipine  5 mg Oral Daily  . atorvastatin  20 mg Oral Daily  . divalproex  125 mg Oral QHS  . Dulaglutide  1.5 mg Subcutaneous Weekly  .  ezetimibe  10 mg Oral Daily  . gabapentin  300 mg Oral Daily  . insulin aspart  0-9 Units Subcutaneous TID WC  . insulin detemir  18 Units Subcutaneous QHS  . ketorolac  1 drop Left Eye BID  . levETIRAcetam  250 mg Oral BID  . lisinopril  40 mg Oral Daily  . melatonin  5 mg Oral QHS  . metoprolol tartrate  12.5 mg Oral BID  . mometasone-formoterol  2 puff Inhalation BID  . [START ON 09/17/2020] pantoprazole  40 mg Intravenous Q12H  . polyvinyl alcohol  1 drop Left Eye TID  . potassium chloride SA  20 mEq  Oral BID  . prednisoLONE acetate  1 drop Left Eye BID  . sertraline  100 mg Oral Daily  . tamsulosin  0.4 mg Oral QPC supper   Continuous Infusions: . sodium chloride 100 mL/hr at 09/13/20 2256  . pantoprozole (PROTONIX) infusion 8 mg/hr (09/13/20 2155)     LOS: 1 day    Time spent: 35 minutes.     Elmarie Shiley, MD [facility]   If 7PM-7AM, please contact night-coverage [email or web address]  09/14/2020, 7:23 AM

## 2020-09-15 DIAGNOSIS — K922 Gastrointestinal hemorrhage, unspecified: Secondary | ICD-10-CM

## 2020-09-15 LAB — GLUCOSE, CAPILLARY
Glucose-Capillary: 93 mg/dL (ref 70–99)
Glucose-Capillary: 79 mg/dL (ref 70–99)
Glucose-Capillary: 104 mg/dL — ABNORMAL HIGH (ref 70–99)
Glucose-Capillary: 103 mg/dL — ABNORMAL HIGH (ref 70–99)

## 2020-09-15 LAB — CBC
HCT: 33.3 % — ABNORMAL LOW (ref 39.0–52.0)
Hemoglobin: 11.1 g/dL — ABNORMAL LOW (ref 13.0–17.0)
MCH: 33.6 pg (ref 26.0–34.0)
MCHC: 33.3 g/dL (ref 30.0–36.0)
MCV: 100.9 fL — ABNORMAL HIGH (ref 80.0–100.0)
Platelets: 146 10*3/uL — ABNORMAL LOW (ref 150–400)
RBC: 3.3 MIL/uL — ABNORMAL LOW (ref 4.22–5.81)
RDW: 12.4 % (ref 11.5–15.5)
WBC: 9 10*3/uL (ref 4.0–10.5)
nRBC: 0 % (ref 0.0–0.2)

## 2020-09-15 LAB — BASIC METABOLIC PANEL
Anion gap: 6 (ref 5–15)
BUN: 13 mg/dL (ref 8–23)
CO2: 23 mmol/L (ref 22–32)
Calcium: 8.3 mg/dL — ABNORMAL LOW (ref 8.9–10.3)
Chloride: 109 mmol/L (ref 98–111)
Creatinine, Ser: 0.82 mg/dL (ref 0.61–1.24)
GFR, Estimated: >60 mL/min (ref >60)
Glucose, Bld: 94 mg/dL (ref 70–99)
Potassium: 4 mmol/L (ref 3.5–5.1)
Sodium: 138 mmol/L (ref 135–145)

## 2020-09-15 MED ORDER — POLYETHYLENE GLYCOL 3350 17 G PO PACK
17.0000 g | PACK | Freq: Two times a day (BID) | ORAL | Status: DC
  Administered 2020-09-15 – 2020-09-17 (×4): 17 g via ORAL
  Filled 2020-09-15 (×5): qty 1

## 2020-09-15 NOTE — Progress Notes (Signed)
Notified pharmacy regarding guardrail drug alert for the  pump administration of the Keppra. Dosage & rate verified and stated to be correct with pharmacist. Also, had a second RN witness.

## 2020-09-15 NOTE — Evaluation (Signed)
Physical Therapy Evaluation Patient Details Name: Chad Combs MRN: [id number] DOB: 1955/08/19 Today's Date: 09/15/2020   History of Present Illness  Pt is a 65 yo male that presetned to ED for emesis, abdominal pain. PMH of AAA s/p stent, blind in R eye, COPD, CAD, DM, depression, DVT with IVC filter, hemiparesis s/p CVA,  history of PE, HTN, heart failure.    Clinical Impression  Pt alert, oriented to self, place, situation, month/year. Denied pain. Pt reported he lives at [facility] and at baseline needs assistance for all ADLs/IADLs. Stated maybe once a week he sits up and stand pivots to a WC with one person assist. Primarily uses briefs for bathroom needs, denies any falls.  Upon assessment pt demonstrated extensor tone in both LUE and LLE, no AROM noted. PT able to passively flex elbow/hip/knee with time. Pt demonstrated rolling to R minA and rolling L with supervision and bed rails to assist with changing the wet linens underneath him. Declined further mobility at this time. The patient demonstrated and reported return to baseline level of functioning, no further acute PT needs indicated. PT to sign off. Please reconsult PT if pt status changes or acute needs are identified.      Follow Up Recommendations No PT follow up (return to [facility])    Equipment Recommendations  None recommended by PT    Recommendations for Other Services       Precautions / Restrictions Precautions Precautions: Fall Restrictions Weight Bearing Restrictions: No      Mobility  Bed Mobility Overal bed mobility: Needs Assistance Bed Mobility: Rolling Rolling: Min assist;Supervision         General bed mobility comments: pt rolled L and R, to L supervision with use of bed rails, minA to roll to R due to hemiplegia    Transfers                 General transfer comment: deferred/declined. Pt did not want to attempt sitting EOB or transfers this AM  Ambulation/Gait                 Stairs            Wheelchair Mobility    Modified Rankin (Stroke Patients Only)       Balance                                             Pertinent Vitals/Pain Pain Assessment: No/denies pain    Home Living Family/patient expects to be discharged to:: Skilled nursing facility                 Additional Comments: Pt is LTC resident at [facility]    Prior Function Level of Independence: Needs assistance   Gait / Transfers Assistance Needed: pt reported only about once a week does he sit up/transfer to [facility] with one person assist. uses brief for bathroom needs  ADL's / Homemaking Assistance Needed: needs assistance from staff for all ADLs/IADLs        Hand Dominance        Extremity/Trunk Assessment   Upper Extremity Assessment Upper Extremity Assessment: RUE deficits/detail;LUE deficits/detail RUE Deficits / Details: grossly 4/5, able to move against gravity LUE Deficits / Details: no AROM, tone noted in extension, able to flex elbow with time. fingers not fisted LUE Sensation: WNL  Lower Extremity Assessment Lower Extremity Assessment: LLE deficits/detail;RLE deficits/detail RLE Deficits / Details: able to move against gravity at will, grossly 4/5 LLE Deficits / Details: no AROM noted, tone noted in extension, but able to flex knee with time LLE Sensation: WNL       Communication   Communication: No difficulties  Cognition Arousal/Alertness: Awake/alert Behavior During Therapy: WFL for tasks assessed/performed Overall Cognitive Status: Within Functional Limits for tasks assessed                                 General Comments: reported month/year correctly, as well as hospital      General Comments      Exercises     Assessment/Plan    PT Assessment Patent does not need any further PT services  PT Problem List         PT Treatment Interventions      PT Goals (Current goals can be  found in the Care Plan section)       Frequency     Barriers to discharge        Co-evaluation               AM-PAC PT "6 Clicks" Mobility  Outcome Measure Help needed turning from your back to your side while in a flat bed without using bedrails?: A Lot Help needed moving from lying on your back to sitting on the side of a flat bed without using bedrails?: A Lot Help needed moving to and from a bed to a chair (including a wheelchair)?: A Lot Help needed standing up from a chair using your arms (e.g., wheelchair or bedside chair)?: A Lot Help needed to walk in hospital room?: Total Help needed climbing 3-5 steps with a railing? : Total 6 Click Score: 10    End of Session   Activity Tolerance: Patient tolerated treatment well Patient left: in bed;with call bell/phone within reach;with bed alarm set Nurse Communication: Mobility status PT Visit Diagnosis: Other abnormalities of gait and mobility (R26.89);Muscle weakness (generalized) (M62.81)    Time: 1448-1856 PT Time Calculation (min) (ACUTE ONLY): 15 min   Charges:   PT Evaluation $PT Eval Low Complexity: 1 Low         Lieutenant Diego PT, DPT 9:55 AM,09/15/20

## 2020-09-15 NOTE — Progress Notes (Signed)
PROGRESS NOTE    TRIG MCBRYAR  WFU:[id number] DOB: 20-Apr-1956 DOA: 09/13/2020 PCP: Alvester Morin, MD   Brief Narrative: 65 year old with past medical history significant for bd,cava,be, hypertension, tobacco abuse, peripheral vascular disease, diastolic heart failure, diabetes type 2 who present to the ER with acute onset of coffee-ground emesis, after multiple episodes 4-5 times the day of admission.  She denies melena or bright red blood per rectum.  He is complaining of abdominal pain.  Evaluation in the ED patient was noted to be tachycardic, creatinine 1.2 BUN 26, lactic acid 1.9.  Rectal exam revealed brown stool with positive stool Hemoccult. CT chest abdomen CTA: Interval placement of an endoluminal stent graft transversing the distal abdominal aortic aneurysm.  No evidence of endoleak or complications.  Diffuse atherosclerosis, with mild narrowing at the origin of the celiac artery unchanged.  Mild diffuse atheromatous plaque throughout the iliac limbs of the endoluminal stent graft.  Stable IVC filter.  Esophageal wall thickening consistent with esophagitis, patchy left lower lobe airspace disease which may be inflammatory, infectious or related to aspiration.  Cholelithiasis without cholecystitis.  Patient was a started on Protonix drip.  He received Zofran, Phenergan and IV fluids.  Patient admitted for coffee-ground emesis.   Assessment & Plan:   Active Problems:   Upper GI bleed   1-Upper GI bleed: Differential acute esophagitis, versus Mallory-Weiss tear, acute gastritis versus duodenitis. -Continue with IV fluids, IV Protonix drip. -Agree with holding aspirin and Plavix. -GI has been consulted, Dr. Allen Norris.  -Monitor hemoglobin, hemoglobin at 13. Hb was 16 on admission, drop likely to hemoconcentration, mild component of GI bleed. Hb down to 11. Denies further vomiting. No BM.  -Plan for endoscopy 3/29. After been 3 days off plavix.  -continue to monitor Hb.    2-Essential Hypertension: -Hold Norvasc and lisinopril in the setting of vomiting and possible GI bleed. -We will continue with metoprolol (to avoid rebound tachycardia)  low-dose will add holding parameters for blood pressure. -BP stable.   3-Dyslipidemia: Continue  with statins.  Type 2 Diabetes: Hold Delaglutide.  Hold Lantus, while he is NPO.  Continue with SSI.  Hb A1c 6.3.   History of Seizure;  Continue with keppra to IV>   Depression: Continue with Zoloft BPH: Continue with Flomax Left LQ abdominal pain; constipation. Will order miralax.     Estimated body mass index is 27.48 kg/m as calculated from the following:   Height as of this encounter: 5\' 9"  (1.753 m).   Weight as of this encounter: 84.4 kg.   DVT prophylaxis: SCDs Code Status: Full code Family Communication: Son Updated. 3-27 Disposition Plan:  Status is: Inpatient  Remains inpatient appropriate because:IV treatments appropriate due to intensity of illness or inability to take PO   Dispo: The patient is from: Home              Anticipated d/c is to: Home              Patient currently is not medically stable to d/c.   Difficult to place patient No        Consultants:   GI  Procedures:     Antimicrobials:    Subjective: He is alert today. No BM , report mild left side abdominal pain.  No further vomiting.    Objective: Vitals:   09/14/20 2354 09/15/20 0428 09/15/20 0800 09/15/20 1133  BP: 108/62 122/81 132/77 (!) 147/75  Pulse: 70 70 70 73  Resp: 18 18  Temp: 98 F (36.7 C) 98.5 F (36.9 C) (!) 97.5 F (36.4 C) 98 F (36.7 C)  TempSrc:   Oral Oral  SpO2: 98% 94% 96% 96%  Weight:      Height:        Intake/Output Summary (Last 24 hours) at 09/15/2020 1519 Last data filed at 09/15/2020 1411 Gross per 24 hour  Intake 4019.15 ml  Output 1200 ml  Net 2819.15 ml   Filed Weights   09/13/20 1906 09/13/20 2252  Weight: 82.1 kg 84.4 kg    Examination:  General  exam: NAD Respiratory system: CTA Cardiovascular system: S 1, S 2 RRR Gastrointestinal system: BS present, soft, nt Central nervous system: alert Extremities: no edema   Data Reviewed: I have personally reviewed following labs and imaging studies  CBC: Recent Labs  Lab 09/13/20 1859 09/14/20 0417 09/15/20 0524  WBC 16.4* 12.4* 9.0  NEUTROABS 12.7*  --   --   HGB 15.7 13.0 11.1*  HCT 47.8 40.2 33.3*  MCV 101.7* 102.6* 100.9*  PLT 242 186 545*   Basic Metabolic Panel: Recent Labs  Lab 09/13/20 1859 09/14/20 0417 09/15/20 0524  NA 137 136 138  K 4.4 4.1 4.0  CL 105 108 109  CO2 21* 21* 23  GLUCOSE 202* 149* 94  BUN 26* 23 13  CREATININE 1.25* 0.95 0.82  CALCIUM 9.0 8.4* 8.3*   GFR: Estimated Creatinine Clearance: 89.8 mL/min (by C-G formula based on SCr of 0.82 mg/dL). Liver Function Tests: Recent Labs  Lab 09/13/20 1859 09/14/20 0417  AST 16 14*  ALT 16 14  ALKPHOS 71 60  BILITOT 1.0 0.8  PROT 7.6 6.1*  ALBUMIN 3.7 3.0*   Recent Labs  Lab 09/13/20 1859  LIPASE 32   No results for input(s): AMMONIA in the last 168 hours. Coagulation Profile: Recent Labs  Lab 09/13/20 1859  INR 1.1   Cardiac Enzymes: No results for input(s): CKTOTAL, CKMB, CKMBINDEX, TROPONINI in the last 168 hours. BNP (last 3 results) No results for input(s): PROBNP in the last 8760 hours. HbA1C: Recent Labs    09/13/20 2322  HGBA1C 6.3*   CBG: Recent Labs  Lab 09/14/20 1220 09/14/20 1537 09/14/20 2034 09/15/20 0830 09/15/20 1143  GLUCAP 100* 105* 87 93 79   Lipid Profile: No results for input(s): CHOL, HDL, LDLCALC, TRIG, CHOLHDL, LDLDIRECT in the last 72 hours. Thyroid Function Tests: No results for input(s): TSH, T4TOTAL, FREET4, T3FREE, THYROIDAB in the last 72 hours. Anemia Panel: No results for input(s): VITAMINB12, FOLATE, FERRITIN, TIBC, IRON, RETICCTPCT in the last 72 hours. Sepsis Labs: Recent Labs  Lab 09/13/20 1902  LATICACIDVEN 1.9    Recent  Results (from the past 240 hour(s))  Resp Panel by RT-PCR (Flu A&B, Covid) Nasopharyngeal Swab     Status: None   Collection Time: 09/13/20  7:02 PM   Specimen: Nasopharyngeal Swab; Nasopharyngeal(NP) swabs in vial transport medium  Result Value Ref Range Status   SARS Coronavirus 2 by RT PCR NEGATIVE NEGATIVE Final    Comment: (NOTE) SARS-CoV-2 target nucleic acids are NOT DETECTED.  The SARS-CoV-2 RNA is generally detectable in upper respiratory specimens during the acute phase of infection. The lowest concentration of SARS-CoV-2 viral copies this assay can detect is 138 copies/mL. A negative result does not preclude SARS-Cov-2 infection and should not be used as the sole basis for treatment or other patient management decisions. A negative result may occur with  improper specimen collection/handling, submission of specimen other than nasopharyngeal swab,  presence of viral mutation(s) within the areas targeted by this assay, and inadequate number of viral copies(<138 copies/mL). A negative result must be combined with clinical observations, patient history, and epidemiological information. The expected result is Negative.  Fact Sheet for Patients:  EntrepreneurPulse.com.au  Fact Sheet for Healthcare Providers:  IncredibleEmployment.be  This test is no t yet approved or cleared by the [place] FDA and  has been authorized for detection and/or diagnosis of SARS-CoV-2 by FDA under an Emergency Use Authorization (EUA). This EUA will remain  in effect (meaning this test can be used) for the duration of the COVID-19 declaration under Section 564(b)(1) of the Act, 21 U.S.C.section 360bbb-3(b)(1), unless the authorization is terminated  or revoked sooner.       Influenza A by PCR NEGATIVE NEGATIVE Final   Influenza B by PCR NEGATIVE NEGATIVE Final    Comment: (NOTE) The Xpert Xpress SARS-CoV-2/FLU/RSV plus assay is intended as an aid in the  diagnosis of influenza from Nasopharyngeal swab specimens and should not be used as a sole basis for treatment. Nasal washings and aspirates are unacceptable for Xpert Xpress SARS-CoV-2/FLU/RSV testing.  Fact Sheet for Patients: EntrepreneurPulse.com.au  Fact Sheet for Healthcare Providers: IncredibleEmployment.be  This test is not yet approved or cleared by the [place] FDA and has been authorized for detection and/or diagnosis of SARS-CoV-2 by FDA under an Emergency Use Authorization (EUA). This EUA will remain in effect (meaning this test can be used) for the duration of the COVID-19 declaration under Section 564(b)(1) of the Act, 21 U.S.C. section 360bbb-3(b)(1), unless the authorization is terminated or revoked.  Performed at [facility], [address] [place], [place] [address]          Radiology Studies: CT Angio Chest PE W and/or Wo Contrast  Result Date: 09/13/2020 CLINICAL DATA:  Hematemesis, shortness of breath EXAM: CT ANGIOGRAPHY CHEST WITH CONTRAST TECHNIQUE: Multidetector CT imaging of the chest was performed using the standard protocol during bolus administration of intravenous contrast. Multiplanar CT image reconstructions and MIPs were obtained to evaluate the vascular anatomy. CONTRAST:  134mL OMNIPAQUE IOHEXOL 350 MG/ML SOLN COMPARISON:  09/13/2020 FINDINGS: Cardiovascular: This is a technically adequate evaluation of the pulmonary vasculature. No filling defects or pulmonary emboli. Stable focal dilation of the aortic arch just distal to the takeoff of the left subclavian artery, measuring up to 3.3 cm, not appreciably changed. No evidence of thoracic aortic aneurysm or dissection. The heart is unremarkable without pericardial effusion. Extensive atherosclerosis thin the coronary vasculature, greatest in the LAD distribution. Mediastinum/Nodes: There is diffuse esophageal wall thickening, consistent with  esophagitis. Fluid-filled esophagus and stomach are noted. The thyroid and trachea are unremarkable.  No pathologic adenopathy. Lungs/Pleura: Upper lobe predominant emphysema. Patchy airspace disease within the left lower lobe may be inflammatory, infectious, or related to aspiration. No effusion or pneumothorax. Upper Abdomen: No acute abnormality. Musculoskeletal: No acute or destructive bony lesions. Reconstructed images demonstrate no additional findings. Review of the MIP images confirms the above findings. IMPRESSION: 1. No evidence of pulmonary embolus. 2. Diffuse esophageal wall thickening consistent with esophagitis. 3. Fluid-filled stomach and esophagus may be related to history of vomiting. 4. Stable focal dilation of the distal aortic arch, not appreciably changed since prior study. No aneurysm or dissection. 5. Patchy consolidation left lower lobe may be inflammatory, infectious, or related to aspiration. Electronically Signed   By: Randa Ngo M.D.   On: 09/13/2020 20:58   DG Chest Portable 1 View  Result Date: 09/13/2020  CLINICAL DATA:  Hematemesis, shortness of breath EXAM: PORTABLE CHEST 1 VIEW COMPARISON:  06/02/2020 FINDINGS: Single frontal view of the chest demonstrates an unremarkable cardiac silhouette. No airspace disease, effusion, or pneumothorax. No acute bony abnormalities. IMPRESSION: 1. No acute intrathoracic process. Electronically Signed   By: Randa Ngo M.D.   On: 09/13/2020 19:30   CT Angio Abd/Pel W and/or Wo Contrast  Result Date: 09/13/2020 CLINICAL DATA:  Hematemesis, gastrointestinal bleeding EXAM: CTA ABDOMEN AND PELVIS WITHOUT AND WITH CONTRAST TECHNIQUE: Multidetector CT imaging of the abdomen and pelvis was performed using the standard protocol during bolus administration of intravenous contrast. Multiplanar reconstructed images and MIPs were obtained and reviewed to evaluate the vascular anatomy. 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 Outer dimensions of the aneurysm sac now measure 5.2 x 7.3 cm, previously measuring 5.2 x 7.8 cm. There is no evidence of endoleak. Calcifications are seen within the excluded lumen of the aneurysm. The endoluminal stent graft is patent, with mild atheromatous plaque seen within the bilateral iliac limbs. Celiac: Mild stenosis at the origin of the celiac artery unchanged, less than 50%. No aneurysm or dissection. SMA: Patent without evidence of aneurysm, dissection, vasculitis or significant stenosis. Renals: Both renal arteries are patent without evidence of aneurysm, dissection, vasculitis, fibromuscular dysplasia or significant stenosis. IMA: The IMA does not opacified its origin, which arises from the excluded aneurysm sac. Distal reconstitution via collaterals from the SMA. Inflow: There is mild stenosis of the bilateral external iliac arteries, without focal stenosis. Proximal Outflow: Bilateral common femoral and visualized portions of the superficial and profunda femoral arteries are patent without evidence of aneurysm, dissection, vasculitis or significant stenosis. Veins: IVC filter identified.  No other focal abnormalities. Review of the MIP images confirms the above findings. NON-VASCULAR Lower chest: Patchy consolidation left lower lobe consistent with airspace disease. This could be inflammatory, infectious, or related to aspiration. Hepatobiliary: Small gallstones are identified without cholecystitis. Liver is unremarkable. Pancreas: Unremarkable. No pancreatic ductal dilatation or surrounding inflammatory changes. Spleen: Normal in size without focal abnormality. Adrenals/Urinary Tract: Adrenal glands are unremarkable. Kidneys are normal, without renal calculi, focal lesion, or hydronephrosis. Bladder is unremarkable.  Stomach/Bowel: No bowel obstruction or ileus. No bowel wall thickening or inflammatory change. Wall thickening of the distal thoracic esophagus consistent with esophagitis. No evidence of intraluminal contrast accumulation to suggest active gastrointestinal bleeding. Lymphatic: No pathologic adenopathy within the abdomen or pelvis. Reproductive: Prostate is unremarkable. Other: No free fluid or free gas.  No abdominal wall hernia. Musculoskeletal: Chronic compression deformities at T12 and L2. No acute bony abnormalities. Reconstructed images demonstrate no additional findings. IMPRESSION: VASCULAR 1. Interval placement of an endoluminal stent graft traversing the distal abdominal aortic aneurysm. No evidence of endoleak or complication. 2. Diffuse atherosclerosis, with mild narrowing at the origin of the celiac artery unchanged. Mild diffuse atheromatous plaque throughout the iliac limbs of the endoluminal stent graft. 3. Stable IVC filter. NON-VASCULAR 1. Esophageal wall thickening consistent with esophagitis. 2. No evidence of active gastrointestinal bleeding. 3. Patchy left lower lobe airspace disease which may be inflammatory, infectious, or related to aspiration. 4. Cholelithiasis without cholecystitis. Electronically Signed   By: Randa Ngo M.D.   On: 09/13/2020 21:12        Scheduled Meds: . atorvastatin  20 mg Oral Daily  . divalproex  125 mg Oral QHS  .  ezetimibe  10 mg Oral Daily  . gabapentin  300 mg Oral Daily  . insulin aspart  0-9 Units Subcutaneous TID WC  . ketorolac  1 drop Left Eye BID  . melatonin  5 mg Oral QHS  . metoprolol tartrate  12.5 mg Oral BID  . mometasone-formoterol  2 puff Inhalation BID  . [START ON 09/17/2020] pantoprazole  40 mg Intravenous Q12H  . polyvinyl alcohol  1 drop Left Eye TID  . potassium chloride SA  20 mEq Oral BID  . prednisoLONE acetate  1 drop Left Eye BID  . sertraline  100 mg Oral Daily  . tamsulosin  0.4 mg Oral QPC supper   Continuous  Infusions: . sodium chloride 100 mL/hr at 09/15/20 0631  . levETIRAcetam 250 mg (09/15/20 1341)  . pantoprozole (PROTONIX) infusion 8 mg/hr (09/15/20 0732)     LOS: 2 days    Time spent: 35 minutes.     Elmarie Shiley, MD [facility]   If 7PM-7AM, please contact night-coverage [email or web address]  09/15/2020, 3:19 PM

## 2020-09-15 NOTE — NC FL2 (Signed)
[place] LEVEL OF CARE SCREENING TOOL     IDENTIFICATION  Patient Name: Chad Combs Birthdate: Jun 25, 1955 Sex: male Admission Date (Current Location): 09/13/2020  [place] and [place] Number:  Engineering geologist and Address:  [facility], [address] [place], [place] [address]      Provider Number: [id number]  Attending Physician Name and Address:  Elmarie Shiley, MD  Relative Name and Phone Number:       Current Level of Care: Hospital Recommended Level of Care: [place] Prior Approval Number:    Date Approved/Denied:   PASRR Number: [id number]  Discharge Plan: SNF    Current Diagnoses: Patient Active Problem List   Diagnosis Date Noted  . Upper GI bleed 09/13/2020  . Acute colitis 01/24/2020  . Hematochezia 01/24/2020  . Chest pain 02/17/2018  . UTI (urinary tract infection) 05/19/2017  . Seizure-like activity ([place]) 05/19/2017  . Hyperlipidemia 11/08/2016  . Type II diabetes mellitus with neurological manifestations, uncontrolled ([place]) 10/04/2016  . Type 2 diabetes mellitus with hyperlipidemia ([place]) 10/04/2016  . Abdominal aortic aneurysm (AAA) greater than 5.5 cm in diameter in male [facility])   . Acute deep vein thrombosis (DVT) of femoral vein of left lower extremity ([place])   . Pulmonary embolus ([place])   . Adjustment disorder   . Acute right MCA stroke ([place]) 08/25/2016  . Left hemiplegia ([place])   . Dysarthria, post-stroke   . Dysphagia, post-stroke   . Carotid stenosis   . Benign essential HTN   . Leukocytosis   . Polycythemia vera ([place])   . Tobacco abuse   . Chronic obstructive pulmonary disease ([place])   . 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 Currently NPO.)  AMBULATORY STATUS COMMUNICATION OF NEEDS Skin   Extensive Assist Verbally Normal                       Personal Care Assistance Level of Assistance              Functional Limitations Info  Sight,Hearing,Speech Sight Info: Adequate Hearing Info: Adequate Speech Info: Adequate    SPECIAL CARE FACTORS FREQUENCY                       Contractures Contractures Info: Not present    Additional Factors Info  Code Status,Allergies Code Status Info: Full code Allergies Info: Bee Pollen           Current Medications (09/15/2020):  This is the current hospital active medication list Current Facility-Administered Medications  Medication Dose Route Frequency Provider Last Rate Last Admin  . 0.9 %  sodium chloride infusion   Intravenous Continuous Mansy, Arvella Merles, MD 100 mL/hr at 09/15/20 0631 New Bag at 09/15/20 0631  . acetaminophen (TYLENOL) tablet 650 mg  650 mg Oral Q6H PRN Mansy, Jan A, MD       Or  . acetaminophen (TYLENOL) suppository 650 mg  650 mg Rectal Q6H PRN Mansy, Jan A, MD      . atorvastatin (LIPITOR) tablet 20 mg  20 mg Oral Daily Mansy, Jan A, MD   20 mg at 09/14/20 1401  . divalproex (DEPAKOTE) DR tablet 125  mg  125 mg Oral QHS Mansy, Jan A, MD   125 mg at 09/14/20 2125  . ezetimibe (ZETIA) tablet 10 mg  10 mg Oral Daily Mansy, Jan A, MD   10 mg at 09/14/20 1405  . gabapentin (NEURONTIN) capsule 300 mg  300 mg Oral Daily Mansy, Jan A, MD   300 mg at 09/14/20 1400  . insulin aspart (novoLOG) injection 0-9 Units  0-9 Units Subcutaneous TID WC Mansy, Jan A, MD      . ketorolac (ACULAR) 0.5 % ophthalmic solution 1 drop  1 drop Left Eye BID Mansy, Jan A, MD   1 drop at 09/15/20 1004  . levETIRAcetam (KEPPRA) 250 mg in sodium chloride 0.9 % 100 mL IVPB  250 mg Intravenous Q12H Regalado, Belkys A, MD   Stopped at 09/15/20 0017  . magnesium hydroxide (MILK OF MAGNESIA) suspension 30 mL  30 mL Oral Daily PRN Mansy,  Jan A, MD      . melatonin tablet 5 mg  5 mg Oral QHS Mansy, Jan A, MD   5 mg at 09/13/20 2255  . metoprolol tartrate (LOPRESSOR) tablet 12.5 mg  12.5 mg Oral BID Regalado, Belkys A, MD   12.5 mg at 09/14/20 1401  . mometasone-formoterol (DULERA) 100-5 MCG/ACT inhaler 2 puff  2 puff Inhalation BID Mansy, Jan A, MD   2 puff at 09/15/20 1004  . ondansetron (ZOFRAN) tablet 4 mg  4 mg Oral Q6H PRN Mansy, Jan A, MD       Or  . ondansetron [facility]) injection 4 mg  4 mg Intravenous Q6H PRN Mansy, Jan A, MD      . pantoprazole (PROTONIX) 80 mg in sodium chloride 0.9 % 100 mL (0.8 mg/mL) infusion  8 mg/hr Intravenous Continuous Vanessa [place], MD 10 mL/hr at 09/15/20 0732 8 mg/hr at 09/15/20 0732  . [START ON 09/17/2020] pantoprazole (PROTONIX) injection 40 mg  40 mg Intravenous Q12H Vanessa , MD      . polyvinyl alcohol (LIQUIFILM TEARS) 1.4 % ophthalmic solution 1 drop  1 drop Left Eye TID Mansy, Jan A, MD   1 drop at 09/15/20 1005  . potassium chloride SA (KLOR-CON) CR tablet 20 mEq  20 mEq Oral BID Mansy, Jan A, MD   20 mEq at 09/14/20 2125  . prednisoLONE acetate (PRED FORTE) 1 % ophthalmic suspension 1 drop  1 drop Left Eye BID Mansy, Jan A, MD   1 drop at 09/15/20 1005  . sertraline (ZOLOFT) tablet 100 mg  100 mg Oral Daily Mansy, Jan A, MD   100 mg at 09/14/20 1401  . simethicone (MYLICON) chewable tablet 80 mg  80 mg Oral QID PRN Mansy, Jan A, MD      . tamsulosin [facility]) capsule 0.4 mg  0.4 mg Oral QPC supper Mansy, Jan A, MD   0.4 mg at 09/14/20 1814  . traZODone (DESYREL) tablet 25 mg  25 mg Oral QHS PRN Mansy, Arvella Merles, MD         Discharge Medications: Please see discharge summary for a list of discharge medications.  Relevant Imaging Results:  Relevant Lab Results:   Additional Information SS#: [id number]  Candie Chroman, LCSW

## 2020-09-15 NOTE — Progress Notes (Signed)
Jonathon Bellows , MD [address], [place], [place], [place], [address] [address], [place], [place], [place], [address] Phone: [phone]  Fax: [phone]   Chad Combs is being followed for hematemesis day 1 of follow up   Subjective: No further episodes of hematemesis   Objective: Vital signs in last 24 hours: Vitals:   09/14/20 2033 09/14/20 2354 09/15/20 0428 09/15/20 0800  BP: 106/64 108/62 122/81 132/77  Pulse: 70 70 70 70  Resp: 18 18 18    Temp: 98.4 F (36.9 C) 98 F (36.7 C) 98.5 F (36.9 C) (!) 97.5 F (36.4 C)  TempSrc:    Oral  SpO2: 100% 98% 94% 96%  Weight:      Height:       Weight change:   Intake/Output Summary (Last 24 hours) at 09/15/2020 6389 Last data filed at 09/15/2020 3734 Gross per 24 hour  Intake 3779.15 ml  Output 1200 ml  Net 2579.15 ml     Exam: Heart:: Regular rate and rhythm, S1S2 present or without murmur or extra heart sounds Lungs: normal, clear to auscultation and clear to auscultation and percussion Abdomen: soft, nontender, normal bowel sounds   Lab Results: @LABTEST2 @ Micro Results: Recent Results (from the past 240 hour(s))  Resp Panel by RT-PCR (Flu A&B, Covid) Nasopharyngeal Swab     Status: None   Collection Time: 09/13/20  7:02 PM   Specimen: Nasopharyngeal Swab; Nasopharyngeal(NP) swabs in vial transport medium  Result Value Ref Range Status   SARS Coronavirus 2 by RT PCR NEGATIVE NEGATIVE Final    Comment: (NOTE) SARS-CoV-2 target nucleic acids are NOT DETECTED.  The SARS-CoV-2 RNA is generally detectable in upper respiratory specimens during the acute phase of infection. The lowest concentration of SARS-CoV-2 viral copies this assay can detect is 138 copies/mL. A negative result does not preclude SARS-Cov-2 infection and should not be used as the sole basis for treatment or other patient management decisions. A negative result may occur with  improper specimen collection/handling, submission of  specimen other than nasopharyngeal swab, presence of viral mutation(s) within the areas targeted by this assay, and inadequate number of viral copies(<138 copies/mL). A negative result must be combined with clinical observations, patient history, and epidemiological information. The expected result is Negative.  Fact Sheet for Patients:  EntrepreneurPulse.com.au  Fact Sheet for Healthcare Providers:  IncredibleEmployment.be  This test is no t yet approved or cleared by the [place] FDA and  has been authorized for detection and/or diagnosis of SARS-CoV-2 by FDA under an Emergency Use Authorization (EUA). This EUA will remain  in effect (meaning this test can be used) for the duration of the COVID-19 declaration under Section 564(b)(1) of the Act, 21 U.S.C.section 360bbb-3(b)(1), unless the authorization is terminated  or revoked sooner.       Influenza A by PCR NEGATIVE NEGATIVE Final   Influenza B by PCR NEGATIVE NEGATIVE Final    Comment: (NOTE) The Xpert Xpress SARS-CoV-2/FLU/RSV plus assay is intended as an aid in the diagnosis of influenza from Nasopharyngeal swab specimens and should not be used as a sole basis for treatment. Nasal washings and aspirates are unacceptable for Xpert Xpress SARS-CoV-2/FLU/RSV testing.  Fact Sheet for Patients: EntrepreneurPulse.com.au  Fact Sheet for Healthcare Providers: IncredibleEmployment.be  This test is not yet approved or cleared by the [place] FDA and has been authorized for detection and/or diagnosis of SARS-CoV-2 by FDA under an Emergency Use Authorization (EUA). This EUA will remain in effect (meaning this  test can be used) for the duration of the COVID-19 declaration under Section 564(b)(1) of the Act, 21 U.S.C. section 360bbb-3(b)(1), unless the authorization is terminated or revoked.  Performed at [facility], [address] [place], [place] [address]    Studies/Results: CT Angio Chest PE W and/or Wo Contrast  Result Date: 09/13/2020 CLINICAL DATA:  Hematemesis, shortness of breath EXAM: CT ANGIOGRAPHY CHEST WITH CONTRAST TECHNIQUE: Multidetector CT imaging of the chest was performed using the standard protocol during bolus administration of intravenous contrast. Multiplanar CT image reconstructions and MIPs were obtained to evaluate the vascular anatomy. CONTRAST:  137mL OMNIPAQUE IOHEXOL 350 MG/ML SOLN COMPARISON:  09/13/2020 FINDINGS: Cardiovascular: This is a technically adequate evaluation of the pulmonary vasculature. No filling defects or pulmonary emboli. Stable focal dilation of the aortic arch just distal to the takeoff of the left subclavian artery, measuring up to 3.3 cm, not appreciably changed. No evidence of thoracic aortic aneurysm or dissection. The heart is unremarkable without pericardial effusion. Extensive atherosclerosis thin the coronary vasculature, greatest in the LAD distribution. Mediastinum/Nodes: There is diffuse esophageal wall thickening, consistent with esophagitis. Fluid-filled esophagus and stomach are noted. The thyroid and trachea are unremarkable.  No pathologic adenopathy. Lungs/Pleura: Upper lobe predominant emphysema. Patchy airspace disease within the left lower lobe may be inflammatory, infectious, or related to aspiration. No effusion or pneumothorax. Upper Abdomen: No acute abnormality. Musculoskeletal: No acute or destructive bony lesions. Reconstructed images demonstrate no additional findings. Review of the MIP images confirms the above findings. IMPRESSION: 1. No evidence of pulmonary embolus. 2. Diffuse esophageal wall thickening consistent with esophagitis. 3. Fluid-filled stomach and esophagus may be related to history of vomiting. 4. Stable focal dilation of the distal aortic arch, not appreciably changed since prior study. No aneurysm or dissection. 5. Patchy consolidation left  lower lobe may be inflammatory, infectious, or related to aspiration. Electronically Signed   By: Randa Ngo M.D.   On: 09/13/2020 20:58   DG Chest Portable 1 View  Result Date: 09/13/2020 CLINICAL DATA:  Hematemesis, shortness of breath EXAM: PORTABLE CHEST 1 VIEW COMPARISON:  06/02/2020 FINDINGS: Single frontal view of the chest demonstrates an unremarkable cardiac silhouette. No airspace disease, effusion, or pneumothorax. No acute bony abnormalities. IMPRESSION: 1. No acute intrathoracic process. Electronically Signed   By: Randa Ngo M.D.   On: 09/13/2020 19:30   CT Angio Abd/Pel W and/or Wo Contrast  Result Date: 09/13/2020 CLINICAL DATA:  Hematemesis, gastrointestinal bleeding EXAM: CTA ABDOMEN AND PELVIS WITHOUT AND WITH CONTRAST TECHNIQUE: Multidetector CT imaging of the abdomen and pelvis was performed using the standard protocol during bolus administration of intravenous contrast. Multiplanar reconstructed images and MIPs were obtained and reviewed to evaluate the vascular anatomy. 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 Outer dimensions of the aneurysm sac now measure 5.2 x 7.3 cm, previously measuring 5.2 x 7.8 cm. There is no evidence of endoleak. Calcifications are seen within the excluded lumen of the aneurysm. The endoluminal stent graft is patent, with mild atheromatous plaque seen within the bilateral iliac limbs. Celiac: Mild stenosis at the origin of the celiac artery unchanged, less than 50%. No aneurysm or dissection. SMA: Patent without evidence of aneurysm, dissection, vasculitis or significant stenosis. Renals: Both renal arteries are patent without evidence of aneurysm, dissection, vasculitis, fibromuscular dysplasia or significant stenosis. IMA: The IMA does not  opacified its  origin, which arises from the excluded aneurysm sac. Distal reconstitution via collaterals from the SMA. Inflow: There is mild stenosis of the bilateral external iliac arteries, without focal stenosis. Proximal Outflow: Bilateral common femoral and visualized portions of the superficial and profunda femoral arteries are patent without evidence of aneurysm, dissection, vasculitis or significant stenosis. Veins: IVC filter identified.  No other focal abnormalities. Review of the MIP images confirms the above findings. NON-VASCULAR Lower chest: Patchy consolidation left lower lobe consistent with airspace disease. This could be inflammatory, infectious, or related to aspiration. Hepatobiliary: Small gallstones are identified without cholecystitis. Liver is unremarkable. Pancreas: Unremarkable. No pancreatic ductal dilatation or surrounding inflammatory changes. Spleen: Normal in size without focal abnormality. Adrenals/Urinary Tract: Adrenal glands are unremarkable. Kidneys are normal, without renal calculi, focal lesion, or hydronephrosis. Bladder is unremarkable. Stomach/Bowel: No bowel obstruction or ileus. No bowel wall thickening or inflammatory change. Wall thickening of the distal thoracic esophagus consistent with esophagitis. No evidence of intraluminal contrast accumulation to suggest active gastrointestinal bleeding. Lymphatic: No pathologic adenopathy within the abdomen or pelvis. Reproductive: Prostate is unremarkable. Other: No free fluid or free gas.  No abdominal wall hernia. Musculoskeletal: Chronic compression deformities at T12 and L2. No acute bony abnormalities. Reconstructed images demonstrate no additional findings. IMPRESSION: VASCULAR 1. Interval placement of an endoluminal stent graft traversing the distal abdominal aortic aneurysm. No evidence of endoleak or complication. 2. Diffuse atherosclerosis, with mild narrowing at the origin of the celiac artery unchanged. Mild diffuse atheromatous  plaque throughout the iliac limbs of the endoluminal stent graft. 3. Stable IVC filter. NON-VASCULAR 1. Esophageal wall thickening consistent with esophagitis. 2. No evidence of active gastrointestinal bleeding. 3. Patchy left lower lobe airspace disease which may be inflammatory, infectious, or related to aspiration. 4. Cholelithiasis without cholecystitis. Electronically Signed   By: Randa Ngo M.D.   On: 09/13/2020 21:12   Medications: I have reviewed the patient's current medications. Scheduled Meds: . atorvastatin  20 mg Oral Daily  . divalproex  125 mg Oral QHS  . ezetimibe  10 mg Oral Daily  . gabapentin  300 mg Oral Daily  . insulin aspart  0-9 Units Subcutaneous TID WC  . ketorolac  1 drop Left Eye BID  . melatonin  5 mg Oral QHS  . metoprolol tartrate  12.5 mg Oral BID  . mometasone-formoterol  2 puff Inhalation BID  . [START ON 09/17/2020] pantoprazole  40 mg Intravenous Q12H  . polyvinyl alcohol  1 drop Left Eye TID  . potassium chloride SA  20 mEq Oral BID  . prednisoLONE acetate  1 drop Left Eye BID  . sertraline  100 mg Oral Daily  . tamsulosin  0.4 mg Oral QPC supper   Continuous Infusions: . sodium chloride 100 mL/hr at 09/15/20 0631  . levETIRAcetam Stopped (09/15/20 0017)  . pantoprozole (PROTONIX) infusion 8 mg/hr (09/15/20 0732)   PRN Meds:.acetaminophen **OR** acetaminophen, magnesium hydroxide, ondansetron **OR** ondansetron (ZOFRAN) IV, simethicone, traZODone   Assessment: Active Problems:   Upper GI bleed  Chad Combs 65 y.o. male presented to the hospital with hematemesis.  Thickening noted on the CT scan at the distal esophagus level.  Has been on Plavix.  Which has been stopped on 26th.  Hemoglobin today 11.1 g down from 13 g yesterday.Likely secondary to dehydration as creatinine was elevated on admission.  No significant elevation in BUN/creatinine ratio.   Plan: 1.  EGD tomorrow off Plavix for 3 days.  I have discussed alternative  options,  risks & benefits,  which include, but are not limited to, bleeding, infection, perforation,respiratory complication & drug reaction.  The patient agrees with this plan & written consent will be obtained.      LOS: 2 days   Jonathon Bellows, MD 09/15/2020, 9:47 AM

## 2020-09-15 NOTE — TOC Initial Note (Signed)
Transition of Care [facility]) - Initial/Assessment Note    Patient Details  Name: Chad Combs MRN: [id number] Date of Birth: 1955-09-16  [facility]) CM/SW Contact:    Candie Chroman, LCSW Phone Number: 09/15/2020, 10:27 AM  Clinical Narrative:  CSW met with patient. No supports at bedside. CSW introduced role and explained that discharge planning would be discussed. Patient confirmed he was admitted from [facility]. He has been there since 2018. Plan to return at discharge. No further concerns. CSW encouraged patient to contact CSW as needed. CSW will continue to follow patient for support and facilitate return to SNF once medically stable.                Expected Discharge Plan: Skilled Nursing Facility Barriers to Discharge: Continued Medical Work up   Patient Goals and CMS Choice     Choice offered to / list presented to : NA  Expected Discharge Plan and Services Expected Discharge Plan: [place] Acute Care Choice: Resumption of Svcs/PTA Provider Living arrangements for the past 2 months: [place]                                      Prior Living Arrangements/Services Living arrangements for the past 2 months: [place] Lives with:: Facility Resident Patient language and need for interpreter reviewed:: Yes Do you feel safe going back to the place where you live?: Yes      Need for Family Participation in Patient Care: Yes (Comment) Care giver support system in place?: Yes (comment)   Criminal Activity/Legal Involvement Pertinent to Current Situation/Hospitalization: No - Comment as needed  Activities of Daily Living Home Assistive Devices/Equipment: Wheelchair ADL Screening (condition at time of admission) Patient's cognitive ability adequate to safely complete daily activities?: Yes Is the patient deaf or have difficulty hearing?: No Does the patient have difficulty seeing, even when  wearing glasses/contacts?: Yes Does the patient have difficulty concentrating, remembering, or making decisions?: No Patient able to express need for assistance with ADLs?: Yes Does the patient have difficulty dressing or bathing?: Yes Independently performs ADLs?: No Communication: Independent Dressing (OT): Dependent Is this a change from baseline?: Pre-admission baseline Grooming: Dependent Is this a change from baseline?: Pre-admission baseline Feeding: Needs assistance Is this a change from baseline?: Pre-admission baseline Bathing: Dependent Is this a change from baseline?: Pre-admission baseline Toileting: Dependent Is this a change from baseline?: Pre-admission baseline In/Out Bed: Dependent Is this a change from baseline?: Pre-admission baseline Walks in Home: Dependent Is this a change from baseline?: Pre-admission baseline Does the patient have difficulty walking or climbing stairs?: Yes Weakness of Legs: Both Weakness of Arms/Hands: Both  Permission Sought/Granted Permission sought to share information with : Facility Art therapist granted to share information with : Yes, Verbal Permission Granted     Permission granted to share info w AGENCY: [facility], [facility], [facility]        Emotional Assessment Appearance:: Appears stated age Attitude/Demeanor/Rapport: Engaged,Gracious Affect (typically observed): Accepting,Appropriate,Calm,Pleasant Orientation: : Oriented to Self,Oriented to Place,Oriented to  Time,Oriented to Situation Alcohol / Substance Use: Not Applicable Psych Involvement: No (comment)  Admission diagnosis:  Upper GI bleed [K92.2] Acute respiratory failure with hypoxia (HCC) [J96.01] Hypotension, unspecified hypotension type [I95.9] Patient Active Problem List   Diagnosis Date Noted  . Upper GI bleed 09/13/2020  . Acute  colitis 01/24/2020  . Hematochezia 01/24/2020  . Chest pain 02/17/2018  . UTI (urinary tract infection) 05/19/2017   . Seizure-like activity ([place]) 05/19/2017  . Hyperlipidemia 11/08/2016  . Type II diabetes mellitus with neurological manifestations, uncontrolled ([place]) 10/04/2016  . Type 2 diabetes mellitus with hyperlipidemia ([place]) 10/04/2016  . Abdominal aortic aneurysm (AAA) greater than 5.5 cm in diameter in male [facility])   . Acute deep vein thrombosis (DVT) of femoral vein of left lower extremity ([place])   . Pulmonary embolus ([place])   . Adjustment disorder   . Acute right MCA stroke ([place]) 08/25/2016  . Left hemiplegia ([place])   . Dysarthria, post-stroke   . Dysphagia, post-stroke   . Carotid stenosis   . Benign essential HTN   . Leukocytosis   . Polycythemia vera ([place])   . Tobacco abuse   . Chronic obstructive pulmonary disease ([place])   . 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 [id number]: [phone]: [phone]    Social Determinants of Health (SDOH) Interventions    Readmission Risk Interventions No flowsheet data found.

## 2020-09-16 ENCOUNTER — Inpatient Hospital Stay: Payer: Medicare Other | Admitting: Certified Registered Nurse Anesthetist

## 2020-09-16 ENCOUNTER — Encounter
Admission: EM | Disposition: A | Discharge status: Skilled Nursing Facility | Payer: Medicare Other | Source: Home / Self Care | Attending: Internal Medicine

## 2020-09-16 HISTORY — PX: ESOPHAGOGASTRODUODENOSCOPY (EGD) WITH PROPOFOL: SHX5813

## 2020-09-16 LAB — BASIC METABOLIC PANEL
Anion gap: 6 (ref 5–15)
BUN: 9 mg/dL (ref 8–23)
CO2: 25 mmol/L (ref 22–32)
Calcium: 8.5 mg/dL — ABNORMAL LOW (ref 8.9–10.3)
Chloride: 107 mmol/L (ref 98–111)
Creatinine, Ser: 0.96 mg/dL (ref 0.61–1.24)
GFR, Estimated: >60 mL/min (ref >60)
Glucose, Bld: 115 mg/dL — ABNORMAL HIGH (ref 70–99)
Potassium: 3.9 mmol/L (ref 3.5–5.1)
Sodium: 138 mmol/L (ref 135–145)

## 2020-09-16 LAB — CBC
HCT: 34.8 % — ABNORMAL LOW (ref 39.0–52.0)
Hemoglobin: 11.8 g/dL — ABNORMAL LOW (ref 13.0–17.0)
MCH: 33.3 pg (ref 26.0–34.0)
MCHC: 33.9 g/dL (ref 30.0–36.0)
MCV: 98.3 fL (ref 80.0–100.0)
Platelets: 176 10*3/uL (ref 150–400)
RBC: 3.54 MIL/uL — ABNORMAL LOW (ref 4.22–5.81)
RDW: 11.9 % (ref 11.5–15.5)
WBC: 8.9 10*3/uL (ref 4.0–10.5)
nRBC: 0 % (ref 0.0–0.2)

## 2020-09-16 LAB — GLUCOSE, CAPILLARY
Glucose-Capillary: 116 mg/dL — ABNORMAL HIGH (ref 70–99)
Glucose-Capillary: 118 mg/dL — ABNORMAL HIGH (ref 70–99)
Glucose-Capillary: 134 mg/dL — ABNORMAL HIGH (ref 70–99)
Glucose-Capillary: 108 mg/dL — ABNORMAL HIGH (ref 70–99)

## 2020-09-16 SURGERY — ESOPHAGOGASTRODUODENOSCOPY (EGD) WITH PROPOFOL
Anesthesia: General

## 2020-09-16 MED ORDER — PROPOFOL 500 MG/50ML IV EMUL
INTRAVENOUS | Status: DC | PRN
  Administered 2020-09-16: 120 ug/kg/min via INTRAVENOUS

## 2020-09-16 MED ORDER — LIDOCAINE HCL (CARDIAC) PF 100 MG/5ML IV SOSY
PREFILLED_SYRINGE | INTRAVENOUS | Status: DC | PRN
  Administered 2020-09-16: 50 mg via INTRAVENOUS

## 2020-09-16 MED ORDER — LEVETIRACETAM 250 MG PO TABS
250.0000 mg | ORAL_TABLET | Freq: Two times a day (BID) | ORAL | Status: DC
  Administered 2020-09-16 – 2020-09-17 (×2): 250 mg via ORAL
  Filled 2020-09-16 (×3): qty 1

## 2020-09-16 MED ORDER — PHENYLEPHRINE HCL (PRESSORS) 10 MG/ML IV SOLN
INTRAVENOUS | Status: DC | PRN
  Administered 2020-09-16: 100 ug via INTRAVENOUS
  Administered 2020-09-16: 200 ug via INTRAVENOUS

## 2020-09-16 MED ORDER — PANTOPRAZOLE SODIUM 40 MG IV SOLR
40.0000 mg | Freq: Two times a day (BID) | INTRAVENOUS | Status: DC
  Administered 2020-09-16 – 2020-09-17 (×2): 40 mg via INTRAVENOUS
  Filled 2020-09-16 (×2): qty 40

## 2020-09-16 MED ORDER — PROPOFOL 10 MG/ML IV BOLUS
INTRAVENOUS | Status: DC | PRN
  Administered 2020-09-16: 20 mg via INTRAVENOUS
  Administered 2020-09-16: 40 mg via INTRAVENOUS

## 2020-09-16 MED ORDER — PROPOFOL 500 MG/50ML IV EMUL
INTRAVENOUS | Status: AC
  Filled 2020-09-16: qty 50

## 2020-09-16 MED ORDER — LIDOCAINE HCL (PF) 2 % IJ SOLN
INTRAMUSCULAR | Status: AC
  Filled 2020-09-16: qty 5

## 2020-09-16 MED ORDER — SODIUM CHLORIDE 0.9 % IV SOLN: INTRAVENOUS | Status: DC

## 2020-09-16 NOTE — TOC Progression Note (Signed)
Transition of Care [facility]) - Progression Note    Patient Details  Name: Chad Combs MRN: [id number] Date of Birth: 12-09-1955  Transition of Care [facility]) CM/SW [place], LCSW Phone Number: 09/16/2020, 3:45 PM  Clinical Narrative:  Left voicemail for Kellie Shropshire at [facility] to let her know of potential discharge tomorrow. Sent secure chat to MD requesting COVID test.   Expected Discharge Plan: [place] Barriers to Discharge: Continued Medical Work up  Expected Discharge Plan and Services Expected Discharge Plan: [place] Acute Care Choice: Resumption of Svcs/PTA Provider Living arrangements for the past 2 months: [place]                                       Social Determinants of Health (SDOH) Interventions    Readmission Risk Interventions No flowsheet data found.

## 2020-09-16 NOTE — Progress Notes (Addendum)
PROGRESS NOTE    Chad Combs  VPX:[id number] DOB: 1955/10/13 DOA: 09/13/2020 PCP: Alvester Morin, MD   Brief Narrative: 65 year old with past medical history significant for bd,cava,be, hypertension, tobacco abuse, peripheral vascular disease, diastolic heart failure, diabetes type 2 who present to the ER with acute onset of coffee-ground emesis, after multiple episodes 4-5 times the day of admission.  She denies melena or bright red blood per rectum.  He is complaining of abdominal pain.  Evaluation in the ED patient was noted to be tachycardic, creatinine 1.2 BUN 26, lactic acid 1.9.  Rectal exam revealed brown stool with positive stool Hemoccult. CT chest abdomen CTA: Interval placement of an endoluminal stent graft transversing the distal abdominal aortic aneurysm.  No evidence of endoleak or complications.  Diffuse atherosclerosis, with mild narrowing at the origin of the celiac artery unchanged.  Mild diffuse atheromatous plaque throughout the iliac limbs of the endoluminal stent graft.  Stable IVC filter.  Esophageal wall thickening consistent with esophagitis, patchy left lower lobe airspace disease which may be inflammatory, infectious or related to aspiration.  Cholelithiasis without cholecystitis.  Patient was a started on Protonix drip.  He received Zofran, Phenergan and IV fluids.  Patient admitted for coffee-ground emesis.   Assessment & Plan:   Active Problems:   Upper GI bleed   1-Upper GI bleed: Acute esophagitis;  -Continue with IV fluids, IV Protonix drip. -Agree with holding aspirin and Plavix. -GI has been consulted, Dr. Allen Norris.  -Monitor hemoglobin, hemoglobin at 13. Hb was 16 on admission, drop likely to hemoconcentration, mild component of GI bleed. Hb down to 11. Denies further vomiting.  Hemoglobin remained stable at 11 -Endoscopy 3/29: LA grade a reflux esophagitis with no bleeding, normal examined duodenum, medium amount of fluid in the stomach. -GI  recommend omeprazole 40 mg twice daily for 3 months, patient need repeat upper endoscopy in 8 weeks.  2-Essential Hypertension: -Continue to hold hold Norvasc and lisinopril in the setting of vomiting and possible GI bleed. -We will continue with metoprolol (to avoid rebound tachycardia)  low-dose will add holding parameters for blood pressure. -BP stable.   3-Dyslipidemia: Continue  with statins.  Type 2 Diabetes: Hold Delaglutide.  Hold Lantus, CBG 120s. Continue with SSI.  Hb A1c 6.3.   History of Seizure;  Change Keppra to oral.  Depression: Continue with Zoloft BPH: Continue with Flomax Left LQ abdominal pain; constipation.  MiraLAX    Estimated body mass index is 27.48 kg/m as calculated from the following:   Height as of this encounter: 5\' 9"  (1.753 m).   Weight as of this encounter: 84.4 kg.   DVT prophylaxis: SCDs Code Status: Full code Family Communication: Son Updated. 3-27, son didn't answer phone 3-29. Disposition Plan:  Status is: Inpatient  Remains inpatient appropriate because:IV treatments appropriate due to intensity of illness or inability to take PO   Dispo: The patient is from: SNF              Anticipated d/c is to: SNF              Patient currently is not medically stable to d/c.  Patient might be able to be discharged 3/30 if he is able to tolerate diet, and hemoglobin remained stable.   Difficult to place patient No        Consultants:   GI  Procedures:     Antimicrobials:    Subjective: Denies nausea vomiting. Had endoscopy today.   Objective: Vitals:   09/16/20  1039 09/16/20 1045 09/16/20 1054 09/16/20 1104  BP: 112/65 118/65 103/70 123/70  Pulse: 75 73 74 71  Resp:  12 19 16   Temp:      TempSrc:      SpO2: 93% 93% 95% 93%  Weight:      Height:        Intake/Output Summary (Last 24 hours) at 09/16/2020 1437 Last data filed at 09/16/2020 1357 Gross per 24 hour  Intake 3807.02 ml  Output 2600 ml  Net 1207.02  ml   Filed Weights   09/13/20 1906 09/13/20 2252  Weight: 82.1 kg 84.4 kg    Examination:  General exam: NAD Respiratory system: CTA Cardiovascular system: S 1, S 2 RRR Gastrointestinal system: BS present, soft, nt Central nervous system: Alert Extremities: No edema   Data Reviewed: I have personally reviewed following labs and imaging studies  CBC: Recent Labs  Lab 09/13/20 1859 09/14/20 0417 09/15/20 0524 09/16/20 0401  WBC 16.4* 12.4* 9.0 8.9  NEUTROABS 12.7*  --   --   --   HGB 15.7 13.0 11.1* 11.8*  HCT 47.8 40.2 33.3* 34.8*  MCV 101.7* 102.6* 100.9* 98.3  PLT 242 186 146* 371   Basic Metabolic Panel: Recent Labs  Lab 09/13/20 1859 09/14/20 0417 09/15/20 0524 09/16/20 0401  NA 137 136 138 138  K 4.4 4.1 4.0 3.9  CL 105 108 109 107  CO2 21* 21* 23 25  GLUCOSE 202* 149* 94 115*  BUN 26* 23 13 9   CREATININE 1.25* 0.95 0.82 0.96  CALCIUM 9.0 8.4* 8.3* 8.5*   GFR: Estimated Creatinine Clearance: 76.7 mL/min (by C-G formula based on SCr of 0.96 mg/dL). Liver Function Tests: Recent Labs  Lab 09/13/20 1859 09/14/20 0417  AST 16 14*  ALT 16 14  ALKPHOS 71 60  BILITOT 1.0 0.8  PROT 7.6 6.1*  ALBUMIN 3.7 3.0*   Recent Labs  Lab 09/13/20 1859  LIPASE 32   No results for input(s): AMMONIA in the last 168 hours. Coagulation Profile: Recent Labs  Lab 09/13/20 1859  INR 1.1   Cardiac Enzymes: No results for input(s): CKTOTAL, CKMB, CKMBINDEX, TROPONINI in the last 168 hours. BNP (last 3 results) No results for input(s): PROBNP in the last 8760 hours. HbA1C: Recent Labs    09/13/20 2322  HGBA1C 6.3*   CBG: Recent Labs  Lab 09/15/20 1143 09/15/20 1653 09/15/20 1951 09/16/20 0725 09/16/20 0907  GLUCAP 79 104* 103* 116* 118*   Lipid Profile: No results for input(s): CHOL, HDL, LDLCALC, TRIG, CHOLHDL, LDLDIRECT in the last 72 hours. Thyroid Function Tests: No results for input(s): TSH, T4TOTAL, FREET4, T3FREE, THYROIDAB in the last 72  hours. Anemia Panel: No results for input(s): VITAMINB12, FOLATE, FERRITIN, TIBC, IRON, RETICCTPCT in the last 72 hours. Sepsis Labs: Recent Labs  Lab 09/13/20 1902  LATICACIDVEN 1.9    Recent Results (from the past 240 hour(s))  Resp Panel by RT-PCR (Flu A&B, Covid) Nasopharyngeal Swab     Status: None   Collection Time: 09/13/20  7:02 PM   Specimen: Nasopharyngeal Swab; Nasopharyngeal(NP) swabs in vial transport medium  Result Value Ref Range Status   SARS Coronavirus 2 by RT PCR NEGATIVE NEGATIVE Final    Comment: (NOTE) SARS-CoV-2 target nucleic acids are NOT DETECTED.  The SARS-CoV-2 RNA is generally detectable in upper respiratory specimens during the acute phase of infection. The lowest concentration of SARS-CoV-2 viral copies this assay can detect is 138 copies/mL. A negative result does not preclude SARS-Cov-2 infection  and should not be used as the sole basis for treatment or other patient management decisions. A negative result may occur with  improper specimen collection/handling, submission of specimen other than nasopharyngeal swab, presence of viral mutation(s) within the areas targeted by this assay, and inadequate number of viral copies(<138 copies/mL). A negative result must be combined with clinical observations, patient history, and epidemiological information. The expected result is Negative.  Fact Sheet for Patients:  EntrepreneurPulse.com.au  Fact Sheet for Healthcare Providers:  IncredibleEmployment.be  This test is no t yet approved or cleared by the [place] FDA and  has been authorized for detection and/or diagnosis of SARS-CoV-2 by FDA under an Emergency Use Authorization (EUA). This EUA will remain  in effect (meaning this test can be used) for the duration of the COVID-19 declaration under Section 564(b)(1) of the Act, 21 U.S.C.section 360bbb-3(b)(1), unless the authorization is terminated  or revoked  sooner.       Influenza A by PCR NEGATIVE NEGATIVE Final   Influenza B by PCR NEGATIVE NEGATIVE Final    Comment: (NOTE) The Xpert Xpress SARS-CoV-2/FLU/RSV plus assay is intended as an aid in the diagnosis of influenza from Nasopharyngeal swab specimens and should not be used as a sole basis for treatment. Nasal washings and aspirates are unacceptable for Xpert Xpress SARS-CoV-2/FLU/RSV testing.  Fact Sheet for Patients: EntrepreneurPulse.com.au  Fact Sheet for Healthcare Providers: IncredibleEmployment.be  This test is not yet approved or cleared by the [place] FDA and has been authorized for detection and/or diagnosis of SARS-CoV-2 by FDA under an Emergency Use Authorization (EUA). This EUA will remain in effect (meaning this test can be used) for the duration of the COVID-19 declaration under Section 564(b)(1) of the Act, 21 U.S.C. section 360bbb-3(b)(1), unless the authorization is terminated or revoked.  Performed at [facility], [address], [place], [place] [address]          Radiology Studies: No results found.      Scheduled Meds: . atorvastatin  20 mg Oral Daily  . divalproex  125 mg Oral QHS  . ezetimibe  10 mg Oral Daily  . gabapentin  300 mg Oral Daily  . insulin aspart  0-9 Units Subcutaneous TID WC  . ketorolac  1 drop Left Eye BID  . melatonin  5 mg Oral QHS  . metoprolol tartrate  12.5 mg Oral BID  . mometasone-formoterol  2 puff Inhalation BID  . [START ON 09/17/2020] pantoprazole  40 mg Intravenous Q12H  . polyethylene glycol  17 g Oral BID  . polyvinyl alcohol  1 drop Left Eye TID  . potassium chloride SA  20 mEq Oral BID  . prednisoLONE acetate  1 drop Left Eye BID  . sertraline  100 mg Oral Daily  . tamsulosin  0.4 mg Oral QPC supper   Continuous Infusions: . sodium chloride 100 mL/hr at 09/16/20 1011  . levETIRAcetam 250 mg (09/16/20 1234)  . pantoprozole (PROTONIX) infusion 8  mg/hr (09/16/20 0517)     LOS: 3 days    Time spent: 35 minutes.     Elmarie Shiley, MD [facility]   If 7PM-7AM, please contact night-coverage [email or web address]  09/16/2020, 2:37 PM

## 2020-09-16 NOTE — Op Note (Signed)
[facility] Gastroenterology Patient Name: Chad Combs Procedure Date: 09/16/2020 10:12 AM MRN: [id number] Account #: [id number] Date of Birth: [birth year] Admit Type: Outpatient Age: 65 Room: [facility] ENDO ROOM 2 Gender: Male Note Status: Finalized Procedure:             Upper GI endoscopy Indications:           Hematemesis Providers:             Jonathon Bellows MD, MD Referring MD:          Alvester Morin Medicines:             Monitored Anesthesia Care Complications:         No immediate complications. Procedure:             Pre-Anesthesia Assessment:                        - Prior to the procedure, a History and Physical was                         performed, and patient medications, allergies and                         sensitivities were reviewed. The patient's tolerance                         of previous anesthesia was reviewed.                        - The risks and benefits of the procedure and the                         sedation options and risks were discussed with the                         patient. All questions were answered and informed                         consent was obtained.                        - ASA Grade Assessment: III - A patient with severe                         systemic disease.                        After obtaining informed consent, the endoscope was                         passed under direct vision. Throughout the procedure,                         the patient's blood pressure, pulse, and oxygen                         saturations were monitored continuously. The Endoscope                         was introduced through the mouth, and advanced to the  third part of duodenum. The upper GI endoscopy was                         accomplished with ease. The patient tolerated the                         procedure well. Findings:      LA Grade A (one or more mucosal breaks less than 5 mm, not extending        between tops of 2 mucosal folds) esophagitis with no bleeding was found       in the lower third of the esophagus.      The examined duodenum was normal.      A medium amount of food (residue) was found in the cardia.      The cardia and gastric fundus were normal on retroflexion. Impression:            - LA Grade A reflux esophagitis with no bleeding.                        - Normal examined duodenum.                        - A medium amount of food (residue) in the stomach.                        - No specimens collected. Recommendation:        - Return patient to hospital ward for ongoing care.                        - Use Prilosec (omeprazole) 40 mg PO BID for 3 months.                        - Repeat upper endoscopy in 8 weeks to evaluate the                         response to therapy.                        - Return to GI office in 4 weeks. Procedure Code(s):     --- Professional ---                        [phone], Esophagogastroduodenoscopy, flexible,                         transoral; diagnostic, including collection of                         specimen(s) by brushing or washing, when performed                         (separate procedure) Diagnosis Code(s):     --- Professional ---                        K21.00, Gastro-esophageal reflux disease with                         esophagitis, without bleeding  K92.0, Hematemesis CPT copyright 2019 American Medical Association. All rights reserved. The codes documented in this report are preliminary and upon coder review may  be revised to meet current compliance requirements. Jonathon Bellows, MD Jonathon Bellows MD, MD 09/16/2020 10:29:19 AM This report has been signed electronically. Number of Addenda: 0 Note Initiated On: 09/16/2020 10:12 AM Estimated Blood Loss:  Estimated blood loss: none.      [facility]

## 2020-09-16 NOTE — Transfer of Care (Signed)
Immediate Anesthesia Transfer of Care Note  Patient: Chad Combs  Procedure(s) Performed: ESOPHAGOGASTRODUODENOSCOPY (EGD) WITH PROPOFOL (N/A )  Patient Location: Endoscopy Unit  Anesthesia Type:General  Level of Consciousness: drowsy  Airway & Oxygen Therapy: Patient Spontanous Breathing  Post-op Assessment: Report given to RN and Post -op Vital signs reviewed and stable  Post vital signs: Reviewed and stable  Last Vitals:  Vitals Value Taken Time  BP 91/63 09/16/20 1037  Temp 36 C 09/16/20 1035  Pulse 74 09/16/20 1037  Resp 12 09/16/20 1036  SpO2 92 % 09/16/20 1037  Vitals shown include unvalidated device data.  Last Pain:  Vitals:   09/16/20 1035  TempSrc: Temporal  PainSc: Asleep         Complications: No complications documented.

## 2020-09-16 NOTE — H&P (Signed)
Chad Bellows, MD [address] [place], [place], [place], [address] [address], [place], [place], [place], [address] Phone: [phone]  Fax: [phone]  Primary Care Physician:  Alvester Morin, MD   Pre-Procedure History & Physical: HPI:  Chad Combs is a 65 y.o. male is here for an endoscopy    Past Medical History:  Diagnosis Date  . AAA (abdominal aortic aneurysm) ([place])    a. 08/2016 CTA: 3.1cm saccular aneurysm of distal Ao arch w/ penetrating atheromatous ulcer. 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 09/2017 CTA chest/abd/pelvis [facility]): 6.3cm infrarenal AAA, unchanged bilateral CIA aneurysms (R 3.5cm, L 2.8cm)-->pt refused intervention.  . Anemia   . Blind right eye   . Blind right eye   . Carotid arterial disease ([place])    a. 12/2016 CTA Neck: RICA 100%. LICA >41%; b. 12/4079 s/p trans aortic LICA revascularization and stent placement [facility]); c. 03/2017 Carotid U/S [facility]): No significant LICA stenosis, patent stent.  Marland Kitchen COPD (chronic obstructive pulmonary disease) ([place])   . Depression   . Diabetes mellitus without complication ([place])   . Diastolic dysfunction    a. 08/2016 Echo: EF 60%, Gr1 DD, mild MR, mildly dil LA/RV. No cardiac source of emboli.  . Femoral DVT (deep venous thrombosis) ([place])    a. 08/2016 L Fem Vein DVT ->xarelto/IVC filter. Xarelto subsequently d/c'd.  . Hemiparesis ([place])   . Hemiplegia ([place]) left  . History of pulmonary embolism    a. 08/2016 following CVA-->chronic xarelto, s/p IVC filter. Xarelto subsequently d/c'd.  Marland Kitchen History of right MCA stroke    a. 08/2016 MRI: R MCA infarct w/ occlusion of RICA & R MCA.   Marland Kitchen Hypertension   . Stroke ([place])   . Tobacco abuse     Past Surgical History:  Procedure Laterality Date  . CAROTID ENDARTERECTOMY Left   . CATARACT EXTRACTION W/PHACO Left 07/29/2020   Procedure: CATARACT EXTRACTION PHACO AND INTRAOCULAR LENS PLACEMENT  (IOC) LEFT VISION BLUE 9.69 00:54.3;  Surgeon: Birder Robson, MD;  Location: [place];  Service: Ophthalmology;  Laterality: Left;  . COLONOSCOPY WITH PROPOFOL N/A 05/26/2020   Procedure: COLONOSCOPY WITH PROPOFOL;  Surgeon: Lesly Rubenstein, MD;  Location: [facility] ENDOSCOPY;  Service: Endoscopy;  Laterality: N/A;  . ENDOVASCULAR REPAIR/STENT GRAFT N/A 01/29/2020   Procedure: ENDOVASCULAR REPAIR/STENT GRAFT;  Surgeon: Katha Cabal, MD;  Location: [facility] CV LAB;  Service: Cardiovascular;  Laterality: N/A;  . IVC FILTER INSERTION N/A 08/24/2016   Procedure: IVC Filter Insertion;  Surgeon: Katha Cabal, MD;  Location: [facility] CV LAB;  Service: Cardiovascular;  Laterality: N/A;    Prior to Admission medications   Medication Sig Start Date End Date Taking? Authorizing Provider  acetaminophen (TYLENOL) 650 MG CR tablet Take 650 mg by mouth 3 (three) times daily.   Yes [provider]  amLODipine (NORVASC) 5 MG tablet Take 5 mg by mouth daily.   Yes [provider]  ARTIFICIAL TEARS 0.2-0.2-1 % SOLN Place 1 drop into the left eye 3 (three) times daily. 09/01/20  Yes [provider]  aspirin 81 MG chewable tablet Chew 81 mg by mouth daily.   Yes [provider]  atorvastatin (LIPITOR) 20 MG tablet Take 20 mg by mouth daily. 01/24/20  Yes [provider]  budesonide-formoterol (SYMBICORT) 80-4.5 MCG/ACT inhaler Inhale 2 puffs into the lungs every 12 (twelve) hours.   Yes [provider]  clopidogrel (PLAVIX) 75 MG tablet Take 75 mg by mouth daily.   Yes [provider]  divalproex (DEPAKOTE) 125 MG DR tablet Take 125 mg by mouth at bedtime. 01/24/20  Yes [provider]  docusate sodium (COLACE) 100 MG capsule Take 1 capsule (100 mg total) by mouth daily. 01/31/20  Yes Swayze, Ava, DO  Dulaglutide (TRULICITY) 1.5 UX/3.2GM SOPN Inject 1.5 mg into the skin once a week. Friday   Yes [provider]   ezetimibe (ZETIA) 10 MG tablet Take 1 tablet (10 mg total) by mouth daily at 6 PM. Patient taking differently: Take 10 mg by mouth daily. 10/01/16  Yes Love, Ivan Anchors, PA-C  gabapentin (NEURONTIN) 300 MG capsule Take 300 mg by mouth daily. 01/24/20  Yes [provider]  ketorolac (ACULAR) 0.4 % SOLN Place 1 drop into the left eye 2 (two) times daily. 09/01/20  Yes [provider]  LEVEMIR FLEXTOUCH 100 UNIT/ML FlexPen Inject 18 Units into the skin at bedtime. 01/21/20  Yes [provider]  levETIRAcetam (KEPPRA) 250 MG tablet Take 250 mg by mouth 2 (two) times daily. 01/24/20  Yes [provider]  lisinopril (ZESTRIL) 40 MG tablet Take 40 mg by mouth daily. 01/24/20  Yes [provider]  melatonin 3 MG TABS tablet Take 3 mg by mouth at bedtime.   Yes [provider]  metoprolol tartrate (LOPRESSOR) 25 MG tablet Take 0.5 tablets (12.5 mg total) by mouth 2 (two) times daily. 10/06/16  Yes Hendricks Limes, MD  pantoprazole (PROTONIX) 40 MG tablet Take 40 mg by mouth 2 (two) times daily.  01/24/20  Yes [provider]  polyethylene glycol (MIRALAX / GLYCOLAX) 17 g packet Take 17 g by mouth daily.   Yes [provider]  potassium chloride SA (KLOR-CON) 20 MEQ tablet Take 20 mEq by mouth 2 (two) times daily. 01/23/20  Yes [provider]  prednisoLONE acetate (PRED FORTE) 1 % ophthalmic suspension Place 1 drop into the left eye 2 (two) times daily. 09/01/20  Yes [provider]  senna (SENOKOT) 8.6 MG TABS tablet Take 2 tablets by mouth at bedtime.   Yes [provider]  sertraline (ZOLOFT) 100 MG tablet Take 100 mg by mouth daily. 01/24/20  Yes [provider]  simethicone (MYLICON) 010 MG chewable tablet Chew 125 mg by mouth 3 (three) times daily.   Yes [provider]  tamsulosin (FLOMAX) 0.4 MG CAPS capsule Take 1 capsule (0.4 mg total) by mouth daily after supper. 10/01/16  Yes Love, Ivan Anchors, PA-C   ondansetron (ZOFRAN) 4 MG tablet Take 4 mg by mouth at bedtime. 01/22/20   [provider]  OZEMPIC, 0.25 OR 0.5 MG/DOSE, 2 MG/1.5ML SOPN Inject 0.5 mg into the skin once a week. Friday Patient not taking: Reported on 07/22/2020 01/04/20   [provider]    Allergies as of 09/13/2020 - Review Complete 09/13/2020  Allergen Reaction Noted  . Bee pollen Hives and Swelling 01/24/2020    Family History  Problem Relation Age of Onset  . Cancer Mother   . Heart disease Father     Social History   Socioeconomic History  . Marital status: Widowed    Spouse name: Not on file  . Number of children: Not on file  . Years of education: Not on file  . Highest education level: Not on file  Occupational History  . Occupation: Retired Administrator  Tobacco Use  . Smoking status: Former Smoker  Packs/day: 1.50    Years: 45.00    Pack years: 67.50    Types: Cigarettes    Quit date: 08/30/2016    Years since quitting: 4.0  . Smokeless tobacco: Never Used  Vaping Use  . Vaping Use: Never used  Substance and Sexual Activity  . Alcohol use: Yes    Alcohol/week: 0.0 standard drinks    Comment: occasional (once every couple months)  . Drug use: No  . Sexual activity: Never  Other Topics Concern  . Not on file  Social History Narrative   From [place].  Lives in [place] @ [place] since his CVA in 2018.  L hemiparesis --> w/c bound.   Social Determinants of Health   Financial Resource Strain: Not on file  Food Insecurity: Not on file  Transportation Needs: Not on file  Physical Activity: Not on file  Stress: Not on file  Social Connections: Not on file  Intimate Partner Violence: Not on file    Review of Systems: See HPI, otherwise negative ROS  Physical Exam: BP 130/82   Pulse 78   Temp (!) 96.8 F (36 C) (Temporal)   Resp 20   Ht 5\' 9"  (1.753 m)   Wt 84.4 kg   SpO2 98%   BMI 27.48 kg/m  General:   Alert,  pleasant and cooperative in NAD Head:   Normocephalic and atraumatic. Neck:  Supple; no masses or thyromegaly. Lungs:  Clear throughout to auscultation, normal respiratory effort.    Heart:  +S1, +S2, Regular rate and rhythm, No edema. Abdomen:  Soft, nontender and nondistended. Normal bowel sounds, without guarding, and without rebound.   Neurologic:  Alert and  oriented x4;  grossly normal neurologically.  Impression/Plan: Chad Combs is here for an endoscopy  to be performed for  evaluation of hematemesis    Risks, benefits, limitations, and alternatives regarding endoscopy have been reviewed with the patient.  Questions have been answered.  All parties agreeable.   Chad Bellows, MD  09/16/2020, 10:06 AM

## 2020-09-16 NOTE — Care Management Important Message (Signed)
Important Message  Patient Details  Name: LAVARIUS DOUGHTEN MRN: [id number] Date of Birth: 1955/07/23   Medicare Important Message Given:  Yes     Dannette Barbara 09/16/2020, 11:22 AM

## 2020-09-16 NOTE — Anesthesia Preprocedure Evaluation (Signed)
Anesthesia Evaluation  Patient identified by MRN, date of birth, ID band Patient awake    Reviewed: Allergy & Precautions, H&P , NPO status , Patient's Chart, lab work & pertinent test results, reviewed documented beta blocker date and time   Airway Mallampati: II   Neck ROM: full    Dental  (+) Teeth Intact   Pulmonary neg pulmonary ROS, COPD, Patient abstained from smoking., former smoker,    Pulmonary exam normal        Cardiovascular Exercise Tolerance: Poor hypertension, On Medications negative cardio ROS Normal cardiovascular exam Rhythm:regular Rate:Normal     Neuro/Psych PSYCHIATRIC DISORDERS Depression CVA negative neurological ROS  negative psych ROS   GI/Hepatic negative GI ROS, Neg liver ROS,   Endo/Other  negative endocrine ROSdiabetes  Renal/GU negative Renal ROS  negative genitourinary   Musculoskeletal   Abdominal   Peds  Hematology negative hematology ROS (+) Blood dyscrasia, anemia ,   Anesthesia Other Findings Past Medical History: No date: AAA (abdominal aortic aneurysm) ([place])     Comment:  a. 08/2016 CTA: 3.1cm saccular aneurysm of distal Ao arch              w/ penetrating atheromatous ulcer. 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 09/2017 CTA               chest/abd/pelvis [facility]): 6.3cm infrarenal AAA, unchanged               bilateral CIA aneurysms (R 3.5cm, L 2.8cm)-->pt refused               intervention. 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 12/2016 CTA Neck: RICA 100%. LICA >47%; b. 09/2593 s/p               trans aortic LICA revascularization and stent placement               [facility]); c. 03/2017 Carotid U/S [facility]): No significant LICA               stenosis, patent stent. No date: COPD (chronic  obstructive pulmonary disease) (HCC) No date: Depression No date: Diabetes mellitus without complication (HCC) No date: Diastolic dysfunction     Comment:  a. 08/2016 Echo: EF 60%, Gr1 DD, mild MR, mildly dil               LA/RV. No cardiac source of emboli. No date: Femoral DVT (deep venous thrombosis) (HCC)     Comment:  a. 08/2016 L Fem Vein DVT ->xarelto/IVC filter. Xarelto               subsequently d/c'd. No date: Hemiparesis [facility]) left: Hemiplegia ([place]) No date: History of pulmonary embolism     Comment:  a. 08/2016 following CVA-->chronic xarelto, s/p IVC               filter. Xarelto subsequently d/c'd. No date: History of right MCA stroke     Comment:  a. 08/2016 MRI: R MCA infarct w/ occlusion of RICA & R               MCA.  No date: Hypertension No date: Stroke [facility])  No date: Tobacco abuse Past Surgical History: No date: CAROTID ENDARTERECTOMY; Left 07/29/2020: CATARACT EXTRACTION W/PHACO; Left     Comment:  Procedure: CATARACT EXTRACTION PHACO AND INTRAOCULAR               LENS PLACEMENT ([place]) LEFT VISION BLUE 9.69 00:54.3;                Surgeon: Birder Robson, MD;  Location: [place];  Service: Ophthalmology;  Laterality: Left; 05/26/2020: COLONOSCOPY WITH PROPOFOL; N/A     Comment:  Procedure: COLONOSCOPY WITH PROPOFOL;  Surgeon:               Lesly Rubenstein, MD;  Location: [facility] ENDOSCOPY;                Service: Endoscopy;  Laterality: N/A; 01/29/2020: ENDOVASCULAR REPAIR/STENT GRAFT; N/A     Comment:  Procedure: ENDOVASCULAR REPAIR/STENT GRAFT;  Surgeon:               Katha Cabal, MD;  Location: [place] CV LAB;               Service: Cardiovascular;  Laterality: N/A; 08/24/2016: IVC FILTER INSERTION; N/A     Comment:  Procedure: IVC Filter Insertion;  Surgeon: Katha Cabal, MD;  Location: [place] [facility];  Service:               Cardiovascular;  Laterality: N/A; BMI    Body Mass Index: 27.48 kg/m      Reproductive/Obstetrics negative OB ROS                             Anesthesia Physical Anesthesia Plan  ASA: III  Anesthesia Plan: General   Post-op Pain Management:    Induction:   PONV Risk Score and Plan:   Airway Management Planned:   Additional Equipment:   Intra-op Plan:   Post-operative Plan:   Informed Consent: I have reviewed the patients History and Physical, chart, labs and discussed the procedure including the risks, benefits and alternatives for the proposed anesthesia with the patient or authorized representative who has indicated his/her understanding and acceptance.     Dental Advisory Given  Plan Discussed with: CRNA  Anesthesia Plan Comments:         Anesthesia Quick Evaluation

## 2020-09-16 NOTE — Anesthesia Postprocedure Evaluation (Signed)
Anesthesia Post Note  Patient: Chad Combs  Procedure(s) Performed: ESOPHAGOGASTRODUODENOSCOPY (EGD) WITH PROPOFOL (N/A )  Patient location during evaluation: PACU Anesthesia Type: General Level of consciousness: awake and alert Pain management: pain level controlled Vital Signs Assessment: post-procedure vital signs reviewed and stable Respiratory status: spontaneous breathing, nonlabored ventilation, respiratory function stable and patient connected to nasal cannula oxygen Cardiovascular status: blood pressure returned to baseline and stable Postop Assessment: no apparent nausea or vomiting Anesthetic complications: no   No complications documented.   Last Vitals:  Vitals:   09/16/20 1054 09/16/20 1104  BP: 103/70 123/70  Pulse: 74 71  Resp: 19 16  Temp:    SpO2: 95% 93%    Last Pain:  Vitals:   09/16/20 1104  TempSrc:   PainSc: 0-No pain                 Molli Barrows

## 2020-09-17 ENCOUNTER — Encounter: Payer: Self-pay | Admitting: Gastroenterology

## 2020-09-17 DIAGNOSIS — K209 Esophagitis, unspecified without bleeding: Secondary | ICD-10-CM

## 2020-09-17 DIAGNOSIS — I959 Hypotension, unspecified: Secondary | ICD-10-CM

## 2020-09-17 LAB — GLUCOSE, CAPILLARY
Glucose-Capillary: 132 mg/dL — ABNORMAL HIGH (ref 70–99)
Glucose-Capillary: 115 mg/dL — ABNORMAL HIGH (ref 70–99)
Glucose-Capillary: 117 mg/dL — ABNORMAL HIGH (ref 70–99)

## 2020-09-17 LAB — SARS CORONAVIRUS 2 (TAT 6-24 HRS): SARS Coronavirus 2: NEGATIVE

## 2020-09-17 MED ORDER — PANTOPRAZOLE SODIUM 40 MG PO TBEC: DELAYED_RELEASE_TABLET | ORAL | Status: AC

## 2020-09-17 MED ORDER — CLOPIDOGREL BISULFATE 75 MG PO TABS: 75.0000 mg | ORAL_TABLET | Freq: Every day | ORAL | Status: AC

## 2020-09-17 NOTE — Discharge Summary (Addendum)
Physician Discharge Summary  Chad Combs KNL:[id number] DOB: Aug 15, 1955 DOA: 09/13/2020  PCP: Chad Morin, MD  Admit date: 09/13/2020 Discharge date: 09/17/2020  Admitted From: SNF Disposition: SNF  Recommendations for Outpatient Follow-up:  1. Follow up with PCP in 1 week 2. Follow-up with gastroenterology in 4 weeks 3. Repeat CBC in 2 days 4. Please follow up on the following pending results: None.  No biopsies obtained during EGD.  Discharge Condition: Stable CODE STATUS: Full code Diet recommendation: Heart healthy  Brief/Interim Summary:  Admission HPI written by Chad Mormon, MD   HISTORY OF PRESENT ILLNESS: Chad Combs is a 65 y.o. Caucasian male with medical history significant for multiple medical problems that are mentioned below, including COPD, CVA, PE, hypertension, tobacco abuse, peripheral vascular disease, depression, diastolic CHF and type 2 diabetes mellitus, who presented to the ER with acute onset of coffee-ground emesis which occurred about 4-5 times today.  He denied any melena or bright red bleeding per rectum.  He admits to epigastric abdominal pain with his vomiting.  No fever or chills.  No dysuria, oliguria or hematuria or flank pain.  He denies any chest pain or palpitations.  No headache or dizziness or blurred vision, presyncope or syncope.  No other bleeding diathesis.   Hospital course:  Upper GI bleed LA grade a reflux esophagitis Presenting symptom of coffee-ground emesis.  Patient started on IV Protonix and GI consult was obtained.  GI performed upper endoscopy on 3/29 which revealed LA grade A reflux esophagitis with no evidence of current or prior bleeding.  No biopsies obtained.  Gastroenterology recommending for Protonix 40 mg twice daily for 3 months followed by daily dosing.  GI is also recommending repeat EGD in 2 months with outpatient follow-up in 1 month.  GI recommends resumption of aspirin on discharge and resumption of  Plavix in 2 to 3 days pending stable CBC as an outpatient.  Primary hypertension Patient is on amlodipine and lisinopril as an outpatient.  Patient with soft and transient hypotension.  Antihypertensives were discontinued.  Recommend continue to hold antihypertensives.  Can restart lisinopril and amlodipine in a.m. BP as an outpatient.  Metoprolol resumed.  Hyperlipidemia Continue Zetia and atorvastatin  Diabetes mellitus, type II Resume home Levemir, Trulicity.  History of seizures Stable.  Continue Keppra.  Depression Continue Zoloft.  BPH Continue Flomax.  PAD Patient follows with vascular surgery as an outpatient.  Recent history of iliac stent placement 2021 and is currently on aspirin and Plavix.  Aspirin and Plavix regimen as mentioned above.  Discharge Diagnoses:  Active Problems:   Benign essential HTN   Type 2 diabetes mellitus with hyperlipidemia (HCC)   Upper GI bleed   Esophagitis   Hypotension    Discharge Instructions  Discharge Instructions    Increase activity slowly   Complete by: As directed      Allergies as of 09/17/2020      Reactions   Bee Pollen Hives, Swelling      Medication List    STOP taking these medications   amLODipine 5 MG tablet Commonly known as: NORVASC   lisinopril 40 MG tablet Commonly known as: ZESTRIL   Ozempic (0.25 or 0.5 MG/DOSE) 2 MG/1.5ML Sopn Generic drug: Semaglutide(0.25 or 0.5MG /DOS)     TAKE these medications   acetaminophen 650 MG CR tablet Commonly known as: TYLENOL Take 650 mg by mouth 3 (three) times daily.   Artificial Tears 0.2-0.2-1 % Soln Generic drug: Glycerin-Hypromellose-PEG 400 Place 1  drop into the left eye 3 (three) times daily.   aspirin 81 MG chewable tablet Chew 81 mg by mouth daily.   atorvastatin 20 MG tablet Commonly known as: LIPITOR Take 20 mg by mouth daily.   budesonide-formoterol 80-4.5 MCG/ACT inhaler Commonly known as: SYMBICORT Inhale 2 puffs into the lungs every 12  (twelve) hours.   clopidogrel 75 MG tablet Commonly known as: PLAVIX Take 1 tablet (75 mg total) by mouth daily. Resume on 09/20/2020 if CBC stable Start taking on: September 20, 2020 What changed:   additional instructions  These instructions start on September 20, 2020. If you are unsure what to do until then, ask your doctor or other care provider.   divalproex 125 MG DR tablet Commonly known as: DEPAKOTE Take 125 mg by mouth at bedtime.   docusate sodium 100 MG capsule Commonly known as: COLACE Take 1 capsule (100 mg total) by mouth daily.   ezetimibe 10 MG tablet Commonly known as: ZETIA Take 1 tablet (10 mg total) by mouth daily at 6 PM. What changed: when to take this   gabapentin 300 MG capsule Commonly known as: NEURONTIN Take 300 mg by mouth daily.   ketorolac 0.4 % Soln Commonly known as: ACULAR Place 1 drop into the left eye 2 (two) times daily.   Levemir FlexTouch 100 UNIT/ML FlexPen Generic drug: insulin detemir Inject 18 Units into the skin at bedtime.   levETIRAcetam 250 MG tablet Commonly known as: KEPPRA Take 250 mg by mouth 2 (two) times daily.   melatonin 3 MG Tabs tablet Take 3 mg by mouth at bedtime.   metoprolol tartrate 25 MG tablet Commonly known as: LOPRESSOR Take 0.5 tablets (12.5 mg total) by mouth 2 (two) times daily.   ondansetron 4 MG tablet Commonly known as: ZOFRAN Take 4 mg by mouth at bedtime.   pantoprazole 40 MG tablet Commonly known as: PROTONIX Take 1 tablet (40 mg total) by mouth 2 (two) times daily for 90 days, THEN 1 tablet (40 mg total) daily. Start taking on: September 17, 2020 What changed: See the new instructions.   polyethylene glycol 17 g packet Commonly known as: MIRALAX / GLYCOLAX Take 17 g by mouth daily.   potassium chloride SA 20 MEQ tablet Commonly known as: KLOR-CON Take 20 mEq by mouth 2 (two) times daily.   prednisoLONE acetate 1 % ophthalmic suspension Commonly known as: PRED FORTE Place 1 drop into the  left eye 2 (two) times daily.   senna 8.6 MG Tabs tablet Commonly known as: SENOKOT Take 2 tablets by mouth at bedtime.   sertraline 100 MG tablet Commonly known as: ZOLOFT Take 100 mg by mouth daily.   simethicone 125 MG chewable tablet Commonly known as: MYLICON Chew 741 mg by mouth 3 (three) times daily.   tamsulosin 0.4 MG Caps capsule Commonly known as: FLOMAX Take 1 capsule (0.4 mg total) by mouth daily after supper.   Trulicity 1.5 OI/7.8MV Sopn Generic drug: Dulaglutide Inject 1.5 mg into the skin once a week. Friday       Contact information for follow-up providers    Locklear, Hilton Cork, MD. Schedule an appointment as soon as possible for a visit in 4 week(s).   Specialty: Gastroenterology Why: Hospital follow-up. Contact information: [place] [address] [phone]            Contact information for after-discharge care    Destination    [facility] .   Service:  Skilled Nursing Contact information: [address] [place] [phone]                 Allergies  Allergen Reactions  . Bee Pollen Hives and Swelling    Consultations:  Gastroenterology   Procedures/Studies: CT Angio Chest PE W and/or Wo Contrast  Result Date: 09/13/2020 CLINICAL DATA:  Hematemesis, shortness of breath EXAM: CT ANGIOGRAPHY CHEST WITH CONTRAST TECHNIQUE: Multidetector CT imaging of the chest was performed using the standard protocol during bolus administration of intravenous contrast. Multiplanar CT image reconstructions and MIPs were obtained to evaluate the vascular anatomy. CONTRAST:  159mL OMNIPAQUE IOHEXOL 350 MG/ML SOLN COMPARISON:  09/13/2020 FINDINGS: Cardiovascular: This is a technically adequate evaluation of the pulmonary vasculature. No filling defects or pulmonary emboli. Stable focal dilation of the aortic arch just distal to the takeoff of the left subclavian artery,  measuring up to 3.3 cm, not appreciably changed. No evidence of thoracic aortic aneurysm or dissection. The heart is unremarkable without pericardial effusion. Extensive atherosclerosis thin the coronary vasculature, greatest in the LAD distribution. Mediastinum/Nodes: There is diffuse esophageal wall thickening, consistent with esophagitis. Fluid-filled esophagus and stomach are noted. The thyroid and trachea are unremarkable.  No pathologic adenopathy. Lungs/Pleura: Upper lobe predominant emphysema. Patchy airspace disease within the left lower lobe may be inflammatory, infectious, or related to aspiration. No effusion or pneumothorax. Upper Abdomen: No acute abnormality. Musculoskeletal: No acute or destructive bony lesions. Reconstructed images demonstrate no additional findings. Review of the MIP images confirms the above findings. IMPRESSION: 1. No evidence of pulmonary embolus. 2. Diffuse esophageal wall thickening consistent with esophagitis. 3. Fluid-filled stomach and esophagus may be related to history of vomiting. 4. Stable focal dilation of the distal aortic arch, not appreciably changed since prior study. No aneurysm or dissection. 5. Patchy consolidation left lower lobe may be inflammatory, infectious, or related to aspiration. Electronically Signed   By: Randa Ngo M.D.   On: 09/13/2020 20:58   DG Chest Portable 1 View  Result Date: 09/13/2020 CLINICAL DATA:  Hematemesis, shortness of breath EXAM: PORTABLE CHEST 1 VIEW COMPARISON:  06/02/2020 FINDINGS: Single frontal view of the chest demonstrates an unremarkable cardiac silhouette. No airspace disease, effusion, or pneumothorax. No acute bony abnormalities. IMPRESSION: 1. No acute intrathoracic process. Electronically Signed   By: Randa Ngo M.D.   On: 09/13/2020 19:30   CT Angio Abd/Pel W and/or Wo Contrast  Result Date: 09/13/2020 CLINICAL DATA:  Hematemesis, gastrointestinal bleeding EXAM: CTA ABDOMEN AND PELVIS WITHOUT AND WITH  CONTRAST TECHNIQUE: Multidetector CT imaging of the abdomen and pelvis was performed using the standard protocol during bolus administration of intravenous contrast. Multiplanar reconstructed images and MIPs were obtained and reviewed to evaluate the vascular anatomy. 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 Outer dimensions of the aneurysm sac now measure 5.2 x 7.3 cm, previously measuring 5.2 x 7.8 cm. There is no evidence of endoleak. Calcifications are seen within the excluded lumen of the aneurysm. The endoluminal stent graft is patent, with mild atheromatous plaque seen within the bilateral iliac limbs. Celiac: Mild stenosis at the origin of the celiac artery unchanged, less than 50%. No aneurysm or dissection. SMA: Patent without evidence of aneurysm, dissection, vasculitis or significant stenosis. Renals: Both renal arteries are patent without evidence of aneurysm, dissection, vasculitis, fibromuscular dysplasia or significant stenosis. IMA: The  IMA does not opacified its origin, which arises from the excluded aneurysm sac. Distal reconstitution via collaterals from the SMA. Inflow: There is mild stenosis of the bilateral external iliac arteries, without focal stenosis. Proximal Outflow: Bilateral common femoral and visualized portions of the superficial and profunda femoral arteries are patent without evidence of aneurysm, dissection, vasculitis or significant stenosis. Veins: IVC filter identified.  No other focal abnormalities. Review of the MIP images confirms the above findings. NON-VASCULAR Lower chest: Patchy consolidation left lower lobe consistent with airspace disease. This could be inflammatory, infectious, or related to aspiration. Hepatobiliary: Small gallstones are identified without cholecystitis. Liver  is unremarkable. Pancreas: Unremarkable. No pancreatic ductal dilatation or surrounding inflammatory changes. Spleen: Normal in size without focal abnormality. Adrenals/Urinary Tract: Adrenal glands are unremarkable. Kidneys are normal, without renal calculi, focal lesion, or hydronephrosis. Bladder is unremarkable. Stomach/Bowel: No bowel obstruction or ileus. No bowel wall thickening or inflammatory change. Wall thickening of the distal thoracic esophagus consistent with esophagitis. No evidence of intraluminal contrast accumulation to suggest active gastrointestinal bleeding. Lymphatic: No pathologic adenopathy within the abdomen or pelvis. Reproductive: Prostate is unremarkable. Other: No free fluid or free gas.  No abdominal wall hernia. Musculoskeletal: Chronic compression deformities at T12 and L2. No acute bony abnormalities. Reconstructed images demonstrate no additional findings. IMPRESSION: VASCULAR 1. Interval placement of an endoluminal stent graft traversing the distal abdominal aortic aneurysm. No evidence of endoleak or complication. 2. Diffuse atherosclerosis, with mild narrowing at the origin of the celiac artery unchanged. Mild diffuse atheromatous plaque throughout the iliac limbs of the endoluminal stent graft. 3. Stable IVC filter. NON-VASCULAR 1. Esophageal wall thickening consistent with esophagitis. 2. No evidence of active gastrointestinal bleeding. 3. Patchy left lower lobe airspace disease which may be inflammatory, infectious, or related to aspiration. 4. Cholelithiasis without cholecystitis. Electronically Signed   By: Randa Ngo M.D.   On: 09/13/2020 21:12     UPPER ENDOSCOPY (09/16/2020) Impression:            - LA Grade A reflux esophagitis with no bleeding.                        - Normal examined duodenum.                        - A medium amount of food (residue) in the stomach.                        - No specimens collected. Recommendation:        - Return patient to  hospital ward for ongoing care.                        - Use Prilosec (omeprazole) 40 mg PO BID for 3 months.                        - Repeat upper endoscopy in 8 weeks to evaluate the                         response to therapy.                        - Return to GI office in 4 weeks.   Subjective: Patient without issues overnight.  No bowel movement.  No emesis.  No abdominal  pain.  Discharge Exam: Vitals:   09/17/20 0426 09/17/20 0808  BP: 117/64 132/81  Pulse: 77 72  Resp: 17 18  Temp: 98.3 F (36.8 C) 98.2 F (36.8 C)  SpO2: 94% 95%   Vitals:   09/16/20 1942 09/16/20 2300 09/17/20 0426 09/17/20 0808  BP: 128/74 (!) 116/97 117/64 132/81  Pulse: 83 81 77 72  Resp: 16 20 17 18   Temp: 98.7 F (37.1 C) 98.5 F (36.9 C) 98.3 F (36.8 C) 98.2 F (36.8 C)  TempSrc:    Oral  SpO2: 95% 95% 94% 95%  Weight:      Height:        General: Pt is alert, awake, not in acute distress Cardiovascular: RRR, S1/S2 +, no rubs, no gallops Respiratory: CTA bilaterally, no wheezing, no rhonchi Abdominal: Soft, NT, ND, bowel sounds + Extremities: no edema, no cyanosis    The results of significant diagnostics from this hospitalization (including imaging, microbiology, ancillary and laboratory) are listed below for reference.     Microbiology: Recent Results (from the past 240 hour(s))  Resp Panel by RT-PCR (Flu A&B, Covid) Nasopharyngeal Swab     Status: None   Collection Time: 09/13/20  7:02 PM   Specimen: Nasopharyngeal Swab; Nasopharyngeal(NP) swabs in vial transport medium  Result Value Ref Range Status   SARS Coronavirus 2 by RT PCR NEGATIVE NEGATIVE Final    Comment: (NOTE) SARS-CoV-2 target nucleic acids are NOT DETECTED.  The SARS-CoV-2 RNA is generally detectable in upper respiratory specimens during the acute phase of infection. The lowest concentration of SARS-CoV-2 viral copies this assay can detect is 138 copies/mL. A negative result does not preclude  SARS-Cov-2 infection and should not be used as the sole basis for treatment or other patient management decisions. A negative result may occur with  improper specimen collection/handling, submission of specimen other than nasopharyngeal swab, presence of viral mutation(s) within the areas targeted by this assay, and inadequate number of viral copies(<138 copies/mL). A negative result must be combined with clinical observations, patient history, and epidemiological information. The expected result is Negative.  Fact Sheet for Patients:  EntrepreneurPulse.com.au  Fact Sheet for Healthcare Providers:  IncredibleEmployment.be  This test is no t yet approved or cleared by the [place] FDA and  has been authorized for detection and/or diagnosis of SARS-CoV-2 by FDA under an Emergency Use Authorization (EUA). This EUA will remain  in effect (meaning this test can be used) for the duration of the COVID-19 declaration under Section 564(b)(1) of the Act, 21 U.S.C.section 360bbb-3(b)(1), unless the authorization is terminated  or revoked sooner.       Influenza A by PCR NEGATIVE NEGATIVE Final   Influenza B by PCR NEGATIVE NEGATIVE Final    Comment: (NOTE) The Xpert Xpress SARS-CoV-2/FLU/RSV plus assay is intended as an aid in the diagnosis of influenza from Nasopharyngeal swab specimens and should not be used as a sole basis for treatment. Nasal washings and aspirates are unacceptable for Xpert Xpress SARS-CoV-2/FLU/RSV testing.  Fact Sheet for Patients: EntrepreneurPulse.com.au  Fact Sheet for Healthcare Providers: IncredibleEmployment.be  This test is not yet approved or cleared by the [place] FDA and has been authorized for detection and/or diagnosis of SARS-CoV-2 by FDA under an Emergency Use Authorization (EUA). This EUA will remain in effect (meaning this test can be used) for the duration of  the COVID-19 declaration under Section 564(b)(1) of the Act, 21 U.S.C. section 360bbb-3(b)(1), unless the authorization is terminated or revoked.  Performed at [facility], [place], [place] [address]   SARS CORONAVIRUS 2 (TAT 6-24 HRS) Nasopharyngeal Nasopharyngeal Swab     Status: None   Collection Time: 09/16/20  5:14 PM   Specimen: Nasopharyngeal Swab  Result Value Ref Range Status   SARS Coronavirus 2 NEGATIVE NEGATIVE Final    Comment: (NOTE) SARS-CoV-2 target nucleic acids are NOT DETECTED.  The SARS-CoV-2 RNA is generally detectable in upper and lower respiratory specimens during the acute phase of infection. Negative results do not preclude SARS-CoV-2 infection, do not rule out co-infections with other pathogens, and should not be used as the sole basis for treatment or other patient management decisions. Negative results must be combined with clinical observations, patient history, and epidemiological information. The expected result is Negative.  Fact Sheet for Patients: SugarRoll.be  Fact Sheet for Healthcare Providers: https://www.woods-mathews.com/  This test is not yet approved or cleared by the [place] FDA and  has been authorized for detection and/or diagnosis of SARS-CoV-2 by FDA under an Emergency Use Authorization (EUA). This EUA will remain  in effect (meaning this test can be used) for the duration of the COVID-19 declaration under Se ction 564(b)(1) of the Act, 21 U.S.C. section 360bbb-3(b)(1), unless the authorization is terminated or revoked sooner.  Performed at [facility], [place] [address] [place], [place] [address]      Labs: BNP (last 3 results) No results for input(s): BNP in the last 8760 hours. Basic Metabolic Panel: Recent Labs  Lab 09/13/20 1859 09/14/20 0417 09/15/20 0524 09/16/20 0401  NA 137 136 138 138  K 4.4 4.1 4.0 3.9  CL 105 108 109 107  CO2  21* 21* 23 25  GLUCOSE 202* 149* 94 115*  BUN 26* 23 13 9   CREATININE 1.25* 0.95 0.82 0.96  CALCIUM 9.0 8.4* 8.3* 8.5*   Liver Function Tests: Recent Labs  Lab 09/13/20 1859 09/14/20 0417  AST 16 14*  ALT 16 14  ALKPHOS 71 60  BILITOT 1.0 0.8  PROT 7.6 6.1*  ALBUMIN 3.7 3.0*   Recent Labs  Lab 09/13/20 1859  LIPASE 32   No results for input(s): AMMONIA in the last 168 hours. CBC: Recent Labs  Lab 09/13/20 1859 09/14/20 0417 09/15/20 0524 09/16/20 0401  WBC 16.4* 12.4* 9.0 8.9  NEUTROABS 12.7*  --   --   --   HGB 15.7 13.0 11.1* 11.8*  HCT 47.8 40.2 33.3* 34.8*  MCV 101.7* 102.6* 100.9* 98.3  PLT 242 186 146* 176   Cardiac Enzymes: No results for input(s): CKTOTAL, CKMB, CKMBINDEX, TROPONINI in the last 168 hours. BNP: Invalid input(s): POCBNP CBG: Recent Labs  Lab 09/16/20 1641 09/16/20 2040 09/17/20 0741 09/17/20 0852 09/17/20 1209  GLUCAP 134* 108* 132* 115* 117*   D-Dimer No results for input(s): DDIMER in the last 72 hours. Hgb A1c No results for input(s): HGBA1C in the last 72 hours. Lipid Profile No results for input(s): CHOL, HDL, LDLCALC, TRIG, CHOLHDL, LDLDIRECT in the last 72 hours. Thyroid function studies No results for input(s): TSH, T4TOTAL, T3FREE, THYROIDAB in the last 72 hours.  Invalid input(s): FREET3 Anemia work up No results for input(s): VITAMINB12, FOLATE, FERRITIN, TIBC, IRON, RETICCTPCT in the last 72 hours. Urinalysis    Component Value Date/Time   COLORURINE YELLOW (A) 09/13/2020 1902   APPEARANCEUR HAZY (A) 09/13/2020 1902   LABSPEC 1.045 (H) 09/13/2020 1902   PHURINE 5.0 09/13/2020 1902   GLUCOSEU NEGATIVE 09/13/2020 1902   HGBUR NEGATIVE 09/13/2020 1902  [place] NEGATIVE 09/13/2020 1902   KETONESUR 5 (A) 09/13/2020 1902   PROTEINUR 30 (A) 09/13/2020 1902   NITRITE NEGATIVE 09/13/2020 1902   LEUKOCYTESUR NEGATIVE 09/13/2020 1902   Sepsis Labs Invalid input(s): PROCALCITONIN,  WBC,   LACTICIDVEN Microbiology Recent Results (from the past 240 hour(s))  Resp Panel by RT-PCR (Flu A&B, Covid) Nasopharyngeal Swab     Status: None   Collection Time: 09/13/20  7:02 PM   Specimen: Nasopharyngeal Swab; Nasopharyngeal(NP) swabs in vial transport medium  Result Value Ref Range Status   SARS Coronavirus 2 by RT PCR NEGATIVE NEGATIVE Final    Comment: (NOTE) SARS-CoV-2 target nucleic acids are NOT DETECTED.  The SARS-CoV-2 RNA is generally detectable in upper respiratory specimens during the acute phase of infection. The lowest concentration of SARS-CoV-2 viral copies this assay can detect is 138 copies/mL. A negative result does not preclude SARS-Cov-2 infection and should not be used as the sole basis for treatment or other patient management decisions. A negative result may occur with  improper specimen collection/handling, submission of specimen other than nasopharyngeal swab, presence of viral mutation(s) within the areas targeted by this assay, and inadequate number of viral copies(<138 copies/mL). A negative result must be combined with clinical observations, patient history, and epidemiological information. The expected result is Negative.  Fact Sheet for Patients:  EntrepreneurPulse.com.au  Fact Sheet for Healthcare Providers:  IncredibleEmployment.be  This test is no t yet approved or cleared by the [place] FDA and  has been authorized for detection and/or diagnosis of SARS-CoV-2 by FDA under an Emergency Use Authorization (EUA). This EUA will remain  in effect (meaning this test can be used) for the duration of the COVID-19 declaration under Section 564(b)(1) of the Act, 21 U.S.C.section 360bbb-3(b)(1), unless the authorization is terminated  or revoked sooner.       Influenza A by PCR NEGATIVE NEGATIVE Final   Influenza B by PCR NEGATIVE NEGATIVE Final    Comment: (NOTE) The Xpert Xpress SARS-CoV-2/FLU/RSV plus  assay is intended as an aid in the diagnosis of influenza from Nasopharyngeal swab specimens and should not be used as a sole basis for treatment. Nasal washings and aspirates are unacceptable for Xpert Xpress SARS-CoV-2/FLU/RSV testing.  Fact Sheet for Patients: EntrepreneurPulse.com.au  Fact Sheet for Healthcare Providers: IncredibleEmployment.be  This test is not yet approved or cleared by the [place] FDA and has been authorized for detection and/or diagnosis of SARS-CoV-2 by FDA under an Emergency Use Authorization (EUA). This EUA will remain in effect (meaning this test can be used) for the duration of the COVID-19 declaration under Section 564(b)(1) of the Act, 21 U.S.C. section 360bbb-3(b)(1), unless the authorization is terminated or revoked.  Performed at [facility], [place], [place] [address]   SARS CORONAVIRUS 2 (TAT 6-24 HRS) Nasopharyngeal Nasopharyngeal Swab     Status: None   Collection Time: 09/16/20  5:14 PM   Specimen: Nasopharyngeal Swab  Result Value Ref Range Status   SARS Coronavirus 2 NEGATIVE NEGATIVE Final    Comment: (NOTE) SARS-CoV-2 target nucleic acids are NOT DETECTED.  The SARS-CoV-2 RNA is generally detectable in upper and lower respiratory specimens during the acute phase of infection. Negative results do not preclude SARS-CoV-2 infection, do not rule out co-infections with other pathogens, and should not be used as the sole basis for treatment or other patient management decisions. Negative results must be combined with clinical observations, patient history, and epidemiological information. The expected result is Negative.  Fact  Sheet for Patients: SugarRoll.be  Fact Sheet for Healthcare Providers: https://www.woods-mathews.com/  This test is not yet approved or cleared by the [place] FDA and  has been authorized for  detection and/or diagnosis of SARS-CoV-2 by FDA under an Emergency Use Authorization (EUA). This EUA will remain  in effect (meaning this test can be used) for the duration of the COVID-19 declaration under Se ction 564(b)(1) of the Act, 21 U.S.C. section 360bbb-3(b)(1), unless the authorization is terminated or revoked sooner.  Performed at [facility], [place] [address], [place], [place] [address]      Time coordinating discharge: 35 minutes  SIGNED:   Cordelia Poche, MD [facility] 09/17/2020, 1:12 PM

## 2020-09-17 NOTE — Plan of Care (Signed)
  Problem: Education: Goal: Knowledge of General Education information will improve Description: Including pain rating scale, medication(s)/side effects and non-pharmacologic comfort measures 09/17/2020 0822 by Vivien Rota, RN Outcome: Progressing 09/17/2020 0821 by Vivien Rota, RN Outcome: Progressing   Problem: Health Behavior/Discharge Planning: Goal: Ability to manage health-related needs will improve 09/17/2020 0822 by Vivien Rota, RN Outcome: Progressing 09/17/2020 0821 by Vivien Rota, RN Outcome: Progressing   Problem: Clinical Measurements: Goal: Ability to maintain clinical measurements within normal limits will improve 09/17/2020 0822 by Vivien Rota, RN Outcome: Progressing 09/17/2020 0821 by Vivien Rota, RN Outcome: Progressing Goal: Will remain free from infection 09/17/2020 0822 by Vivien Rota, RN Outcome: Progressing 09/17/2020 0821 by Vivien Rota, RN Outcome: Progressing Goal: Diagnostic test results will improve 09/17/2020 0822 by Vivien Rota, RN Outcome: Progressing 09/17/2020 0821 by Vivien Rota, RN Outcome: Progressing Goal: Respiratory complications will improve 09/17/2020 0822 by Vivien Rota, RN Outcome: Progressing 09/17/2020 0821 by Vivien Rota, RN Outcome: Progressing Goal: Cardiovascular complication will be avoided 09/17/2020 9211 by Vivien Rota, RN Outcome: Progressing 09/17/2020 0821 by Vivien Rota, RN Outcome: Progressing   Problem: Activity: Goal: Risk for activity intolerance will decrease 09/17/2020 0822 by Vivien Rota, RN Outcome: Progressing 09/17/2020 0821 by Vivien Rota, RN Outcome: Progressing   Problem: Nutrition: Goal: Adequate nutrition will be maintained 09/17/2020 0822 by Vivien Rota, RN Outcome: Progressing 09/17/2020 0821 by Vivien Rota, RN Outcome: Progressing   Problem:  Coping: Goal: Level of anxiety will decrease 09/17/2020 0822 by Vivien Rota, RN Outcome: Progressing 09/17/2020 0821 by Vivien Rota, RN Outcome: Progressing   Problem: Elimination: Goal: Will not experience complications related to bowel motility 09/17/2020 0822 by Vivien Rota, RN Outcome: Progressing 09/17/2020 0821 by Vivien Rota, RN Outcome: Progressing Goal: Will not experience complications related to urinary retention 09/17/2020 0822 by Vivien Rota, RN Outcome: Progressing 09/17/2020 0821 by Vivien Rota, RN Outcome: Progressing   Problem: Pain Managment: Goal: General experience of comfort will improve 09/17/2020 0822 by Vivien Rota, RN Outcome: Progressing 09/17/2020 0821 by Vivien Rota, RN Outcome: Progressing   Problem: Safety: Goal: Ability to remain free from injury will improve 09/17/2020 0822 by Vivien Rota, RN Outcome: Progressing 09/17/2020 0821 by Vivien Rota, RN Outcome: Progressing   Problem: Skin Integrity: Goal: Risk for impaired skin integrity will decrease 09/17/2020 0822 by Vivien Rota, RN Outcome: Progressing 09/17/2020 0821 by Vivien Rota, RN Outcome: Progressing

## 2020-09-17 NOTE — Plan of Care (Signed)

## 2020-09-17 NOTE — TOC Transition Note (Signed)
Transition of Care [facility]) - CM/SW Discharge Note   Patient Details  Name: KATRELL MILHORN MRN: [id number] Date of Birth: 1955-08-18  Transition of Care [facility]) CM/SW Contact:  Candie Chroman, LCSW Phone Number: 09/17/2020, 2:23 PM   Clinical Narrative:  Patient has orders to discharge to [facility] today. RN has already called report. EMS transport has been arranged and he is 3rd on the list. No further concerns. CSW signing off.   Final next level of care: Skilled Nursing Facility Barriers to Discharge: Barriers Resolved   Patient Goals and CMS Choice     Choice offered to / list presented to : NA  Discharge Placement   Existing PASRR number confirmed : 09/15/20          Patient chooses bed at: [facility] Patient to be transferred to facility by: EMS Name of family member notified: Will Gasaway Patient and family notified of of transfer: 09/17/20  Discharge Plan and Services     Post Acute Care Choice: Resumption of Svcs/PTA Provider                               Social Determinants of Health (SDOH) Interventions     Readmission Risk Interventions No flowsheet data found.

## 2020-09-17 NOTE — Discharge Instructions (Signed)
Chad Combs,  You are in the hospital because of evidence of upper GI bleeding.  The gastroenterologist evaluated you and performed an EGD/upper endoscopy.  This revealed no evidence of active bleeding..  While you are here, you had no recurrent episodes and your hemoglobin/blood count was stable.  The GI doctors recommended for you to be on Protonix twice a day for the last 3 months and you will need a repeat EGD/upper endoscopy in the next 2 months.  Please resume your aspirin today.  You can resume your Plavix in 2 to 3 days if your hemoglobin remains stable.

## 2020-10-23 ENCOUNTER — Telehealth: Payer: Self-pay

## 2020-10-23 NOTE — Telephone Encounter (Signed)
Per Dr. Vicente Males, he would like for patient to do a repeat EGD. LVM for son Will to call the office back.

## 2022-04-19 ENCOUNTER — Encounter (INDEPENDENT_AMBULATORY_CARE_PROVIDER_SITE_OTHER): Payer: Self-pay

## 2022-06-10 ENCOUNTER — Other Ambulatory Visit: Payer: Self-pay

## 2022-06-10 ENCOUNTER — Emergency Department: Payer: Medicare Other

## 2022-06-10 ENCOUNTER — Encounter: Payer: Self-pay | Admitting: Radiology

## 2022-06-10 ENCOUNTER — Emergency Department
Admission: EM | Admit: 2022-06-10 | Discharge: 2022-06-10 | Disposition: A | Discharge status: Skilled Nursing Facility | Payer: Medicare Other | Attending: Emergency Medicine | Admitting: Emergency Medicine

## 2022-06-10 DIAGNOSIS — J101 Influenza due to other identified influenza virus with other respiratory manifestations: Secondary | ICD-10-CM | POA: Diagnosis not present

## 2022-06-10 DIAGNOSIS — Z8673 Personal history of transient ischemic attack (TIA), and cerebral infarction without residual deficits: Secondary | ICD-10-CM | POA: Insufficient documentation

## 2022-06-10 DIAGNOSIS — E119 Type 2 diabetes mellitus without complications: Secondary | ICD-10-CM | POA: Insufficient documentation

## 2022-06-10 DIAGNOSIS — Z1152 Encounter for screening for COVID-19: Secondary | ICD-10-CM | POA: Diagnosis not present

## 2022-06-10 DIAGNOSIS — R079 Chest pain, unspecified: Secondary | ICD-10-CM

## 2022-06-10 DIAGNOSIS — J449 Chronic obstructive pulmonary disease, unspecified: Secondary | ICD-10-CM | POA: Insufficient documentation

## 2022-06-10 DIAGNOSIS — I10 Essential (primary) hypertension: Secondary | ICD-10-CM | POA: Diagnosis not present

## 2022-06-10 DIAGNOSIS — R0789 Other chest pain: Secondary | ICD-10-CM | POA: Diagnosis present

## 2022-06-10 LAB — CBC WITH DIFFERENTIAL/PLATELET
Abs Immature Granulocytes: 0.02 10*3/uL (ref 0.00–0.07)
Basophils Absolute: 0 10*3/uL (ref 0.0–0.1)
Basophils Relative: 1 %
Eosinophils Absolute: 0 10*3/uL (ref 0.0–0.5)
Eosinophils Relative: 0 %
HCT: 46.8 % (ref 39.0–52.0)
Hemoglobin: 15.9 g/dL (ref 13.0–17.0)
Immature Granulocytes: 0 %
Lymphocytes Relative: 11 %
Lymphs Abs: 0.6 10*3/uL — ABNORMAL LOW (ref 0.7–4.0)
MCH: 33.1 pg (ref 26.0–34.0)
MCHC: 34 g/dL (ref 30.0–36.0)
MCV: 97.5 fL (ref 80.0–100.0)
Monocytes Absolute: 0.4 10*3/uL (ref 0.1–1.0)
Monocytes Relative: 8 %
Neutro Abs: 4.3 10*3/uL (ref 1.7–7.7)
Neutrophils Relative %: 80 %
Platelets: 168 10*3/uL (ref 150–400)
RBC: 4.8 MIL/uL (ref 4.22–5.81)
RDW: 12.6 % (ref 11.5–15.5)
WBC: 5.4 10*3/uL (ref 4.0–10.5)
nRBC: 0 % (ref 0.0–0.2)

## 2022-06-10 LAB — COMPREHENSIVE METABOLIC PANEL
ALT: 44 U/L (ref 0–44)
AST: 57 U/L — ABNORMAL HIGH (ref 15–41)
Albumin: 3.6 g/dL (ref 3.5–5.0)
Alkaline Phosphatase: 63 U/L (ref 38–126)
Anion gap: 12 (ref 5–15)
BUN: 15 mg/dL (ref 8–23)
CO2: 22 mmol/L (ref 22–32)
Calcium: 8.7 mg/dL — ABNORMAL LOW (ref 8.9–10.3)
Chloride: 98 mmol/L (ref 98–111)
Creatinine, Ser: 1.12 mg/dL (ref 0.61–1.24)
GFR, Estimated: >60 mL/min (ref >60)
Glucose, Bld: 265 mg/dL — ABNORMAL HIGH (ref 70–99)
Potassium: 3.7 mmol/L (ref 3.5–5.1)
Sodium: 132 mmol/L — ABNORMAL LOW (ref 135–145)
Total Bilirubin: 1.3 mg/dL — ABNORMAL HIGH (ref 0.3–1.2)
Total Protein: 7.7 g/dL (ref 6.5–8.1)

## 2022-06-10 LAB — LACTIC ACID, PLASMA
Lactic Acid, Venous: 2.4 mmol/L (ref 0.5–1.9)
Lactic Acid, Venous: 1.7 mmol/L (ref 0.5–1.9)

## 2022-06-10 LAB — RESP PANEL BY RT-PCR (RSV, FLU A&B, COVID)  RVPGX2
Influenza A by PCR: POSITIVE — AB
Influenza B by PCR: NEGATIVE
Resp Syncytial Virus by PCR: NEGATIVE
SARS Coronavirus 2 by RT PCR: NEGATIVE

## 2022-06-10 LAB — TROPONIN I (HIGH SENSITIVITY)
Troponin I (High Sensitivity): 13 ng/L (ref <18)
Troponin I (High Sensitivity): 14 ng/L (ref <18)

## 2022-06-10 MED ORDER — LACTATED RINGERS IV BOLUS
1000.0000 mL | Freq: Once | INTRAVENOUS | Status: AC
  Administered 2022-06-10: 1000 mL via INTRAVENOUS

## 2022-06-10 MED ORDER — IOHEXOL 350 MG/ML SOLN
75.0000 mL | Freq: Once | INTRAVENOUS | Status: AC | PRN
  Administered 2022-06-10: 75 mL via INTRAVENOUS

## 2022-06-10 NOTE — ED Notes (Signed)
[facility]  CALLED  FOR  TRANSPORT  TO  [facility] 

## 2022-06-10 NOTE — ED Notes (Addendum)
Pt in CT.

## 2022-06-10 NOTE — ED Notes (Signed)
Lactic 2.4, MD aware

## 2022-06-10 NOTE — ED Provider Notes (Signed)
[facility] Provider Note    Event Date/Time   First MD Initiated Contact with Patient 06/10/22 [phone]     (approximate)   History   Chief Complaint Chest Pain   HPI  Chad Combs is a 66 y.o. male with past medical history of hypertension, diabetes, stroke with left-sided deficits, COPD, polycythemia vera, DVT/PE, and AAA who presents to the ED complaining of chest pain.  Patient reports that he first developed sharp pain in the center of his chest last night, which seem to go away when he went to bed, but came back when he woke up this morning.  He reports associated cough productive of yellowish sputum and has been feeling slightly short of breath.  He denies any fevers, does state he has been exposed to the flu by multiple other residents in his nursing facility.  He has not noticed any pain or swelling in his legs.  He denies any nausea, vomiting, or diarrhea.     Physical Exam   Triage Vital Signs: ED Triage Vitals  Enc Vitals Group     BP --      Pulse --      Resp --      Temp --      Temp src --      SpO2 06/10/22 1004 90 %     Weight 06/10/22 1005 191 lb 12.8 oz (87 kg)     Height 06/10/22 1005 '5\' 9"'$  (1.753 m)     Head Circumference --      Peak Flow --      Pain Score --      Pain Loc --      Pain Edu? --      Excl. in [place]? --     Most recent vital signs: Vitals:   06/10/22 1230 06/10/22 1300  BP: (!) 154/81 (!) 165/92  Pulse: (!) 113 (!) 105  Resp:    Temp:    SpO2: 96% 94%    Constitutional: Alert and oriented. Eyes: Conjunctivae are normal. Head: Atraumatic. Nose: No congestion/rhinnorhea. Mouth/Throat: Mucous membranes are moist.  Cardiovascular: Tachycardic, regular rhythm. Grossly normal heart sounds.  2+ radial pulses bilaterally. Respiratory: Tachypneic with normal respiratory effort.  No retractions. Lungs CTAB. Gastrointestinal: Soft and nontender. No distention. Musculoskeletal: No lower extremity tenderness nor  edema.  Neurologic:  Normal speech and language. No gross focal neurologic deficits are appreciated.    ED Results / Procedures / Treatments   Labs (all labs ordered are listed, but only abnormal results are displayed) Labs Reviewed  RESP PANEL BY RT-PCR (RSV, FLU A&B, COVID)  RVPGX2 - Abnormal; Notable for the following components:      Result Value   Influenza A by PCR POSITIVE (*)    All other components within normal limits  LACTIC ACID, PLASMA - Abnormal; Notable for the following components:   Lactic Acid, Venous 2.4 (*)    All other components within normal limits  COMPREHENSIVE METABOLIC PANEL - Abnormal; Notable for the following components:   Sodium 132 (*)    Glucose, Bld 265 (*)    Calcium 8.7 (*)    AST 57 (*)    Total Bilirubin 1.3 (*)    All other components within normal limits  CBC WITH DIFFERENTIAL/PLATELET - Abnormal; Notable for the following components:   Lymphs Abs 0.6 (*)    All other components within normal limits  CULTURE, BLOOD (ROUTINE X 2)  CULTURE, BLOOD (ROUTINE X 2)  LACTIC ACID, PLASMA  URINALYSIS, COMPLETE (UACMP) WITH MICROSCOPIC  TROPONIN I (HIGH SENSITIVITY)  TROPONIN I (HIGH SENSITIVITY)     EKG  ED ECG REPORT I, Blake Divine, the attending physician, personally viewed and interpreted this ECG.   Date: 06/10/2022  EKG Time: 10:09  Rate: 113  Rhythm: sinus tachycardia  Axis: Normal  Intervals:none  ST&T Change: None  RADIOLOGY CTA chest reviewed and interpreted by me with no large pulmonary embolism or focal infiltrate noted.  PROCEDURES:  Critical Care performed: No  Procedures   MEDICATIONS ORDERED IN ED: Medications  lactated ringers bolus 1,000 mL (0 mLs Intravenous Stopped 06/10/22 1310)  iohexol (OMNIPAQUE) 350 MG/ML injection 75 mL (75 mLs Intravenous Contrast Given 06/10/22 1138)     IMPRESSION / MDM / ASSESSMENT AND PLAN / ED COURSE  I reviewed the triage vital signs and the nursing notes.                               66 y.o. male with past medical history of hypertension, diabetes, stroke with left-sided deficits, COPD, polycythemia vera, DVT/PE, and AAA who presents to the ED complaining of chest pain, shortness of breath, and productive cough since last night.  Patient's presentation is most consistent with acute presentation with potential threat to life or bodily function.  Differential diagnosis includes, but is not limited to, sepsis, pneumonia, COVID-19, influenza, bronchitis, COPD exacerbation, PE, ACS.  Patient nontoxic-appearing and in no acute distress, vital signs remarkable for tachycardia as well as borderline hypoxia with oxygen saturations of 90% on room air.  We will set goal saturation of greater than 88% given his history of COPD.  EKG shows sinus tachycardia with no ischemic changes.  Presentation concerning for sepsis and we will draw blood cultures and lactic acid, hydrate with IV fluids, and further assess with chest x-ray.  Will also check testing for COVID-19, influenza, and RSV.  Patient also high risk for PE given it does not appear that he is currently anticoagulated, will further assess with CTA of his chest.  CTA chest is negative for PE or other acute process, labs remarkable for mild elevation in lactic acid, however this is downtrending on repeat.  2 sets of troponin are negative and I doubt ACS.  No significant anemia, leukocytosis, electrolyte abnormality, or AKI noted.  Patient tested positive for influenza A, which is likely the source of his symptoms.  He is feeling better on reassessment with improved heart rate and respiratory rate, is requesting for discharge back to nursing facility.  Patient counseled to follow-up with his PCP and to return to the ED for new or worsening symptoms, patient agrees with plan.        FINAL CLINICAL IMPRESSION(S) / ED DIAGNOSES   Final diagnoses:  Nonspecific chest pain  Influenza A     Rx / DC Orders   ED  Discharge Orders     None        Note:  This document was prepared using Dragon voice recognition software and may include unintentional dictation errors.   Blake Divine, MD 06/10/22 [phone]

## 2022-06-10 NOTE — ED Triage Notes (Signed)
BIBA from [facility] for SOB and chest pain that started last night.  States chest pain went away last night but came back this morning.  90%spo2 on RA when EMS arrived, up to 95% on 2L [place].  History of stroke with left sided weakness.  States chest pain is in the center of his chest.  A&Ox4.

## 2022-06-11 LAB — BLOOD CULTURE ID PANEL (REFLEXED) - BCID2

## 2022-06-11 NOTE — ED Provider Notes (Signed)
Procedures     ----------------------------------------- 6:14 AM on 06/11/2022 ----------------------------------------- Notified of positive blood culture bottle. Pt had staph epi in 1/4 bottles, aerobic only, c/w contaminant. No further intervention needed.     Carrie Mew, MD 06/11/22 [phone]

## 2022-06-14 LAB — CULTURE, BLOOD (ROUTINE X 2): Special Requests: ADEQUATE

## 2022-06-15 LAB — CULTURE, BLOOD (ROUTINE X 2)

## 2022-06-22 ENCOUNTER — Ambulatory Visit: Admit: 2022-06-22 | Payer: Medicare Other | Admitting: Ophthalmology

## 2022-06-22 SURGERY — PHACOEMULSIFICATION, CATARACT, WITH IOL INSERTION
Anesthesia: Topical | Laterality: Right

## 2024-07-19 ENCOUNTER — Emergency Department

## 2024-07-19 ENCOUNTER — Other Ambulatory Visit: Payer: Self-pay

## 2024-07-19 ENCOUNTER — Observation Stay

## 2024-07-19 ENCOUNTER — Inpatient Hospital Stay
Admission: EM | Admit: 2024-07-19 | Discharge: 2024-07-22 | DRG: 872 | Disposition: A | Discharge status: Skilled Nursing Facility | Source: Skilled Nursing Facility | Attending: Internal Medicine | Admitting: Internal Medicine

## 2024-07-19 DIAGNOSIS — E872 Acidosis, unspecified: Secondary | ICD-10-CM | POA: Diagnosis present

## 2024-07-19 DIAGNOSIS — Z7901 Long term (current) use of anticoagulants: Secondary | ICD-10-CM

## 2024-07-19 DIAGNOSIS — R6520 Severe sepsis without septic shock: Secondary | ICD-10-CM | POA: Diagnosis present

## 2024-07-19 DIAGNOSIS — G4700 Insomnia, unspecified: Secondary | ICD-10-CM | POA: Diagnosis present

## 2024-07-19 DIAGNOSIS — I69354 Hemiplegia and hemiparesis following cerebral infarction affecting left non-dominant side: Secondary | ICD-10-CM

## 2024-07-19 DIAGNOSIS — F32A Depression, unspecified: Secondary | ICD-10-CM | POA: Diagnosis present

## 2024-07-19 DIAGNOSIS — I2699 Other pulmonary embolism without acute cor pulmonale: Secondary | ICD-10-CM | POA: Diagnosis present

## 2024-07-19 DIAGNOSIS — I10 Essential (primary) hypertension: Secondary | ICD-10-CM | POA: Diagnosis present

## 2024-07-19 DIAGNOSIS — A419 Sepsis, unspecified organism: Principal | ICD-10-CM | POA: Diagnosis present

## 2024-07-19 DIAGNOSIS — R739 Hyperglycemia, unspecified: Secondary | ICD-10-CM

## 2024-07-19 DIAGNOSIS — E1169 Type 2 diabetes mellitus with other specified complication: Secondary | ICD-10-CM | POA: Diagnosis present

## 2024-07-19 DIAGNOSIS — G40909 Epilepsy, unspecified, not intractable, without status epilepticus: Secondary | ICD-10-CM | POA: Diagnosis present

## 2024-07-19 DIAGNOSIS — N3001 Acute cystitis with hematuria: Secondary | ICD-10-CM | POA: Diagnosis present

## 2024-07-19 DIAGNOSIS — N3000 Acute cystitis without hematuria: Secondary | ICD-10-CM

## 2024-07-19 DIAGNOSIS — Z79899 Other long term (current) drug therapy: Secondary | ICD-10-CM

## 2024-07-19 DIAGNOSIS — G8194 Hemiplegia, unspecified affecting left nondominant side: Secondary | ICD-10-CM | POA: Diagnosis present

## 2024-07-19 DIAGNOSIS — Z87898 Personal history of other specified conditions: Secondary | ICD-10-CM

## 2024-07-19 DIAGNOSIS — Z87891 Personal history of nicotine dependence: Secondary | ICD-10-CM

## 2024-07-19 DIAGNOSIS — J449 Chronic obstructive pulmonary disease, unspecified: Secondary | ICD-10-CM | POA: Diagnosis present

## 2024-07-19 DIAGNOSIS — Z91030 Bee allergy status: Secondary | ICD-10-CM

## 2024-07-19 DIAGNOSIS — Z794 Long term (current) use of insulin: Secondary | ICD-10-CM

## 2024-07-19 DIAGNOSIS — Z8249 Family history of ischemic heart disease and other diseases of the circulatory system: Secondary | ICD-10-CM

## 2024-07-19 DIAGNOSIS — N390 Urinary tract infection, site not specified: Secondary | ICD-10-CM | POA: Diagnosis present

## 2024-07-19 DIAGNOSIS — Z809 Family history of malignant neoplasm, unspecified: Secondary | ICD-10-CM

## 2024-07-19 DIAGNOSIS — D45 Polycythemia vera: Secondary | ICD-10-CM | POA: Diagnosis present

## 2024-07-19 DIAGNOSIS — D696 Thrombocytopenia, unspecified: Secondary | ICD-10-CM | POA: Insufficient documentation

## 2024-07-19 DIAGNOSIS — Z95828 Presence of other vascular implants and grafts: Secondary | ICD-10-CM

## 2024-07-19 DIAGNOSIS — Z7982 Long term (current) use of aspirin: Secondary | ICD-10-CM

## 2024-07-19 DIAGNOSIS — D6959 Other secondary thrombocytopenia: Secondary | ICD-10-CM | POA: Diagnosis present

## 2024-07-19 DIAGNOSIS — Z7985 Long-term (current) use of injectable non-insulin antidiabetic drugs: Secondary | ICD-10-CM

## 2024-07-19 DIAGNOSIS — Z7902 Long term (current) use of antithrombotics/antiplatelets: Secondary | ICD-10-CM

## 2024-07-19 DIAGNOSIS — E1165 Type 2 diabetes mellitus with hyperglycemia: Secondary | ICD-10-CM | POA: Diagnosis present

## 2024-07-19 DIAGNOSIS — E7849 Other hyperlipidemia: Secondary | ICD-10-CM | POA: Diagnosis present

## 2024-07-19 DIAGNOSIS — Z7951 Long term (current) use of inhaled steroids: Secondary | ICD-10-CM

## 2024-07-19 DIAGNOSIS — H5461 Unqualified visual loss, right eye, normal vision left eye: Secondary | ICD-10-CM | POA: Diagnosis present

## 2024-07-19 DIAGNOSIS — I110 Hypertensive heart disease with heart failure: Secondary | ICD-10-CM | POA: Diagnosis present

## 2024-07-19 DIAGNOSIS — E785 Hyperlipidemia, unspecified: Secondary | ICD-10-CM | POA: Diagnosis present

## 2024-07-19 DIAGNOSIS — Z86711 Personal history of pulmonary embolism: Secondary | ICD-10-CM

## 2024-07-19 DIAGNOSIS — K219 Gastro-esophageal reflux disease without esophagitis: Secondary | ICD-10-CM | POA: Diagnosis present

## 2024-07-19 DIAGNOSIS — Z86718 Personal history of other venous thrombosis and embolism: Secondary | ICD-10-CM

## 2024-07-19 DIAGNOSIS — E1151 Type 2 diabetes mellitus with diabetic peripheral angiopathy without gangrene: Secondary | ICD-10-CM | POA: Diagnosis present

## 2024-07-19 DIAGNOSIS — I5032 Chronic diastolic (congestive) heart failure: Secondary | ICD-10-CM | POA: Diagnosis present

## 2024-07-19 DIAGNOSIS — I714 Abdominal aortic aneurysm, without rupture, unspecified: Secondary | ICD-10-CM | POA: Diagnosis present

## 2024-07-19 DIAGNOSIS — Z1152 Encounter for screening for COVID-19: Secondary | ICD-10-CM

## 2024-07-19 LAB — CBC WITH DIFFERENTIAL/PLATELET
Abs Immature Granulocytes: 0.29 10*3/uL — ABNORMAL HIGH (ref 0.00–0.07)
Basophils Absolute: 0 10*3/uL (ref 0.0–0.1)
Basophils Relative: 0 %
Eosinophils Absolute: 0 10*3/uL (ref 0.0–0.5)
Eosinophils Relative: 0 %
HCT: 45.3 % (ref 39.0–52.0)
Hemoglobin: 14.7 g/dL (ref 13.0–17.0)
Immature Granulocytes: 2 %
Lymphocytes Relative: 4 %
Lymphs Abs: 0.6 10*3/uL — ABNORMAL LOW (ref 0.7–4.0)
MCH: 32.3 pg (ref 26.0–34.0)
MCHC: 32.5 g/dL (ref 30.0–36.0)
MCV: 99.6 fL (ref 80.0–100.0)
Monocytes Absolute: 0.9 10*3/uL (ref 0.1–1.0)
Monocytes Relative: 5 %
Neutro Abs: 15.6 10*3/uL — ABNORMAL HIGH (ref 1.7–7.7)
Neutrophils Relative %: 89 %
Platelets: 159 10*3/uL (ref 150–400)
RBC: 4.55 MIL/uL (ref 4.22–5.81)
RDW: 12.9 % (ref 11.5–15.5)
WBC: 17.4 10*3/uL — ABNORMAL HIGH (ref 4.0–10.5)
nRBC: 0 % (ref 0.0–0.2)

## 2024-07-19 LAB — COMPREHENSIVE METABOLIC PANEL WITH GFR
ALT: 31 U/L (ref 0–44)
AST: 26 U/L (ref 15–41)
Albumin: 4 g/dL (ref 3.5–5.0)
Alkaline Phosphatase: 88 U/L (ref 38–126)
Anion gap: 16 — ABNORMAL HIGH (ref 5–15)
BUN: 12 mg/dL (ref 8–23)
CO2: 19 mmol/L — ABNORMAL LOW (ref 22–32)
Calcium: 8.9 mg/dL (ref 8.9–10.3)
Chloride: 103 mmol/L (ref 98–111)
Creatinine, Ser: 1.2 mg/dL (ref 0.61–1.24)
GFR, Estimated: >60 mL/min
Glucose, Bld: 287 mg/dL — ABNORMAL HIGH (ref 70–99)
Potassium: 3.8 mmol/L (ref 3.5–5.1)
Sodium: 138 mmol/L (ref 135–145)
Total Bilirubin: 1.9 mg/dL — ABNORMAL HIGH (ref 0.0–1.2)
Total Protein: 7.3 g/dL (ref 6.5–8.1)

## 2024-07-19 LAB — URINALYSIS, W/ REFLEX TO CULTURE (INFECTION SUSPECTED)
Bilirubin Urine: NEGATIVE
Glucose, UA: >=500 mg/dL — AB
Ketones, ur: 5 mg/dL — AB
Nitrite: POSITIVE — AB
Protein, ur: 100 mg/dL — AB
Specific Gravity, Urine: 1.035 — ABNORMAL HIGH (ref 1.005–1.030)
WBC, UA: >50 WBC/hpf (ref 0–5)
pH: 5 (ref 5.0–8.0)

## 2024-07-19 LAB — RESP PANEL BY RT-PCR (RSV, FLU A&B, COVID)  RVPGX2
Influenza A by PCR: NEGATIVE
Influenza B by PCR: NEGATIVE
Resp Syncytial Virus by PCR: NEGATIVE
SARS Coronavirus 2 by RT PCR: NEGATIVE

## 2024-07-19 LAB — PROTIME-INR
INR: 1.1 (ref 0.8–1.2)
Prothrombin Time: 15.2 s (ref 11.4–15.2)

## 2024-07-19 LAB — BETA-HYDROXYBUTYRIC ACID: Beta-Hydroxybutyric Acid: 1.11 mmol/L — ABNORMAL HIGH (ref 0.05–0.27)

## 2024-07-19 LAB — LACTIC ACID, PLASMA
Lactic Acid, Venous: 3.8 mmol/L (ref 0.5–1.9)
Lactic Acid, Venous: 3.5 mmol/L (ref 0.5–1.9)

## 2024-07-19 LAB — GLUCOSE, CAPILLARY: Glucose-Capillary: 272 mg/dL — ABNORMAL HIGH (ref 70–99)

## 2024-07-19 MED ORDER — MORPHINE SULFATE (PF) 2 MG/ML IV SOLN: 2.0000 mg | INTRAVENOUS | Status: DC | PRN

## 2024-07-19 MED ORDER — GABAPENTIN 300 MG PO CAPS
300.0000 mg | ORAL_CAPSULE | Freq: Every day | ORAL | Status: DC
  Administered 2024-07-19 – 2024-07-21 (×3): 300 mg via ORAL
  Filled 2024-07-19 (×3): qty 1

## 2024-07-19 MED ORDER — POLYVINYL ALCOHOL 1.4 % OP SOLN: 1.0000 [drp] | Freq: Four times a day (QID) | OPHTHALMIC | Status: DC | PRN

## 2024-07-19 MED ORDER — INSULIN GLARGINE-YFGN 100 UNIT/ML ~~LOC~~ SOLN
10.0000 [IU] | Freq: Every day | SUBCUTANEOUS | Status: DC
  Administered 2024-07-19: 10 [IU] via SUBCUTANEOUS
  Filled 2024-07-19 (×2): qty 0.1

## 2024-07-19 MED ORDER — INSULIN ASPART 100 UNIT/ML IJ SOLN
0.0000 [IU] | Freq: Every day | INTRAMUSCULAR | Status: DC
  Administered 2024-07-19: 3 [IU] via SUBCUTANEOUS
  Administered 2024-07-20 – 2024-07-21 (×2): 2 [IU] via SUBCUTANEOUS
  Filled 2024-07-19: qty 3
  Filled 2024-07-19 (×2): qty 2

## 2024-07-19 MED ORDER — SODIUM CHLORIDE 0.9 % IV SOLN: INTRAVENOUS | Status: DC

## 2024-07-19 MED ORDER — LACTATED RINGERS IV SOLN: INTRAVENOUS | Status: DC

## 2024-07-19 MED ORDER — SODIUM CHLORIDE 0.9 % IV SOLN
2.0000 g | Freq: Once | INTRAVENOUS | Status: AC
  Administered 2024-07-19: 2 g via INTRAVENOUS
  Filled 2024-07-19: qty 20

## 2024-07-19 MED ORDER — CLOPIDOGREL BISULFATE 75 MG PO TABS
75.0000 mg | ORAL_TABLET | Freq: Every day | ORAL | Status: DC
  Administered 2024-07-19 – 2024-07-22 (×4): 75 mg via ORAL
  Filled 2024-07-19 (×4): qty 1

## 2024-07-19 MED ORDER — ATORVASTATIN CALCIUM 20 MG PO TABS
10.0000 mg | ORAL_TABLET | Freq: Every day | ORAL | Status: DC
  Administered 2024-07-19 – 2024-07-21 (×3): 10 mg via ORAL
  Filled 2024-07-19 (×3): qty 1

## 2024-07-19 MED ORDER — POLYVINYL ALCOHOL 1.4 % OP SOLN
1.0000 [drp] | Freq: Three times a day (TID) | OPHTHALMIC | Status: DC
  Administered 2024-07-19 – 2024-07-22 (×9): 1 [drp] via OPHTHALMIC
  Filled 2024-07-19: qty 15

## 2024-07-19 MED ORDER — METOPROLOL TARTRATE 25 MG PO TABS
12.5000 mg | ORAL_TABLET | Freq: Two times a day (BID) | ORAL | Status: DC
  Administered 2024-07-19 – 2024-07-22 (×7): 12.5 mg via ORAL
  Filled 2024-07-19 (×7): qty 1

## 2024-07-19 MED ORDER — ENOXAPARIN SODIUM 40 MG/0.4ML IJ SOSY
40.0000 mg | PREFILLED_SYRINGE | INTRAMUSCULAR | Status: DC
  Administered 2024-07-19 – 2024-07-21 (×3): 40 mg via SUBCUTANEOUS
  Filled 2024-07-19 (×3): qty 0.4

## 2024-07-19 MED ORDER — MELATONIN 5 MG PO TABS
5.0000 mg | ORAL_TABLET | Freq: Every day | ORAL | Status: DC
  Administered 2024-07-19 – 2024-07-21 (×3): 5 mg via ORAL
  Filled 2024-07-19 (×3): qty 1

## 2024-07-19 MED ORDER — ASPIRIN 81 MG PO CHEW
81.0000 mg | CHEWABLE_TABLET | Freq: Every day | ORAL | Status: DC
  Administered 2024-07-19 – 2024-07-22 (×4): 81 mg via ORAL
  Filled 2024-07-19 (×4): qty 1

## 2024-07-19 MED ORDER — TAMSULOSIN HCL 0.4 MG PO CAPS
0.4000 mg | ORAL_CAPSULE | Freq: Every day | ORAL | Status: DC
  Administered 2024-07-19 – 2024-07-22 (×4): 0.4 mg via ORAL
  Filled 2024-07-19 (×4): qty 1

## 2024-07-19 MED ORDER — PANTOPRAZOLE SODIUM 40 MG PO TBEC
40.0000 mg | DELAYED_RELEASE_TABLET | Freq: Every day | ORAL | Status: DC
  Administered 2024-07-19 – 2024-07-22 (×4): 40 mg via ORAL
  Filled 2024-07-19 (×4): qty 1

## 2024-07-19 MED ORDER — ONDANSETRON HCL 4 MG/2ML IJ SOLN: 4.0000 mg | Freq: Four times a day (QID) | INTRAMUSCULAR | Status: DC | PRN

## 2024-07-19 MED ORDER — SODIUM CHLORIDE 0.9 % IV SOLN
2.0000 g | INTRAVENOUS | Status: DC
  Administered 2024-07-20 – 2024-07-21 (×2): 2 g via INTRAVENOUS
  Filled 2024-07-19 (×3): qty 20

## 2024-07-19 MED ORDER — ALUM & MAG HYDROXIDE-SIMETH 200-200-20 MG/5ML PO SUSP: 30.0000 mL | ORAL | Status: DC | PRN

## 2024-07-19 MED ORDER — LACTATED RINGERS IV BOLUS
1000.0000 mL | Freq: Once | INTRAVENOUS | Status: AC
  Administered 2024-07-19: 1000 mL via INTRAVENOUS

## 2024-07-19 MED ORDER — POLYETHYLENE GLYCOL 3350 17 G PO PACK: 17.0000 g | PACK | Freq: Every day | ORAL | Status: DC | PRN

## 2024-07-19 MED ORDER — DIVALPROEX SODIUM 125 MG PO DR TAB
125.0000 mg | DELAYED_RELEASE_TABLET | Freq: Every day | ORAL | Status: DC
  Administered 2024-07-19 – 2024-07-21 (×3): 125 mg via ORAL
  Filled 2024-07-19 (×4): qty 1

## 2024-07-19 MED ORDER — OXYCODONE HCL 5 MG PO TABS: 5.0000 mg | ORAL_TABLET | ORAL | Status: DC | PRN

## 2024-07-19 MED ORDER — INSULIN ASPART 100 UNIT/ML IJ SOLN
0.0000 [IU] | Freq: Three times a day (TID) | INTRAMUSCULAR | Status: DC
  Administered 2024-07-20: 5 [IU] via SUBCUTANEOUS
  Administered 2024-07-20: 3 [IU] via SUBCUTANEOUS
  Administered 2024-07-20 – 2024-07-21 (×2): 5 [IU] via SUBCUTANEOUS
  Administered 2024-07-21 (×2): 3 [IU] via SUBCUTANEOUS
  Administered 2024-07-22: 7 [IU] via SUBCUTANEOUS
  Administered 2024-07-22: 5 [IU] via SUBCUTANEOUS
  Administered 2024-07-22: 3 [IU] via SUBCUTANEOUS
  Filled 2024-07-19: qty 5
  Filled 2024-07-19: qty 7
  Filled 2024-07-19: qty 5
  Filled 2024-07-19 (×2): qty 3
  Filled 2024-07-19 (×2): qty 5
  Filled 2024-07-19 (×2): qty 3

## 2024-07-19 MED ORDER — ACETAMINOPHEN 650 MG RE SUPP: 650.0000 mg | Freq: Four times a day (QID) | RECTAL | Status: DC | PRN

## 2024-07-19 MED ORDER — ACETAMINOPHEN 325 MG PO TABS: 650.0000 mg | ORAL_TABLET | Freq: Four times a day (QID) | ORAL | Status: DC | PRN

## 2024-07-19 MED ORDER — LACTATED RINGERS IV BOLUS (SEPSIS)
1000.0000 mL | Freq: Once | INTRAVENOUS | Status: AC
  Administered 2024-07-19: 1000 mL via INTRAVENOUS

## 2024-07-19 MED ORDER — SERTRALINE HCL 50 MG PO TABS
125.0000 mg | ORAL_TABLET | Freq: Every day | ORAL | Status: DC
  Administered 2024-07-19 – 2024-07-22 (×4): 125 mg via ORAL
  Filled 2024-07-19 (×4): qty 3

## 2024-07-19 MED ORDER — LEVETIRACETAM 250 MG PO TABS
250.0000 mg | ORAL_TABLET | Freq: Two times a day (BID) | ORAL | Status: DC
  Administered 2024-07-19 – 2024-07-22 (×7): 250 mg via ORAL
  Filled 2024-07-19 (×8): qty 1

## 2024-07-19 MED ORDER — ONDANSETRON HCL 4 MG PO TABS: 4.0000 mg | ORAL_TABLET | Freq: Four times a day (QID) | ORAL | Status: DC | PRN

## 2024-07-19 MED ORDER — IOHEXOL 300 MG/ML  SOLN
100.0000 mL | Freq: Once | INTRAMUSCULAR | Status: AC | PRN
  Administered 2024-07-19: 100 mL via INTRAVENOUS

## 2024-07-19 NOTE — ED Triage Notes (Signed)
 BIB [facility].  Called out for seizures  Baseline tremors. No seizure noted with EMS.  Pt had CBG greater than 500 with staff. They gave 10 Units insulin .  Facility also gave neb tx for wheezing.  Temp 103.1.  1 gram tylenol  given by EMS.  VS HR 120, 90% on RA, RR31, 151/73.

## 2024-07-19 NOTE — ED Notes (Signed)
 Pt had bowel movement, pericare performed, barrier cream applied.  Clean brief and chux.  Adjusted in bed and new warm blanket given

## 2024-07-19 NOTE — ED Notes (Signed)
 Pt with bowel movement, pericare and complete linen change of bed.  Ointment to scrotum/skin folds.  Pt immediately had incontinence of urine and was changed again immediately.

## 2024-07-19 NOTE — Sepsis Progress Note (Signed)
 Elink will follow per sepsis protocol.

## 2024-07-19 NOTE — ED Notes (Signed)
 Pt sat up to eat dinner tray.  Chicken cut up for him and tray placed where he can see it on the left.  Pt able to eat with right hand independently if food is cut up and packages opened.

## 2024-07-19 NOTE — ED Notes (Signed)
 Pt had bowel movement.  Pericare performed and barrier cream applied.  Pt's scrotum is excoriated and bloody.  EDP aware.  Linens changed.

## 2024-07-19 NOTE — ED Notes (Signed)
 CCMD aware

## 2024-07-19 NOTE — ED Provider Notes (Signed)
 "  [facility] Provider Note    Event Date/Time   First MD Initiated Contact with Patient 07/19/24 1147     (approximate)   History   SEPSIS   HPI  Chad Combs is a 69 y.o. male past medical history significant for hypertension, diabetes, stroke with residual left-sided deficits, COPD, polycythemia vera, DVT/PE, AAA, seizure disorder, who presents to the emergency department with concern for an infection.  States that he started feeling poorly this morning.  Complaining of burning with urination and pain above his bladder.  Also endorses some cough and congestion.  The facility gave him a nebulizer treatment for some wheezing yesterday but does not normally get breathing treatments.  Does have a history of seizure disorder but no known seizure-like activity.  Also concerned that he had rectal bleeding  Patient arrives from [facility], was initially called out for seizures but then states that he had baseline tremors.  When EMS arrived patient was found to have a temperature of 103.1 and was given 1 g of IV Tylenol .  Was tachycardic in the 120s and 90% on room air.  Patient's glucose check at the facility was greater than 500 so they gave him 10 units of insulin  prior to EMS arrival.     Physical Exam   Triage Vital Signs: ED Triage Vitals  Encounter Vitals Group     BP 07/19/24 1149 129/78     Girls Systolic BP Percentile --      Girls Diastolic BP Percentile --      Boys Systolic BP Percentile --      Boys Diastolic BP Percentile --      Pulse --      Resp --      Temp --      Temp src --      SpO2 --      Weight --      Height --      Head Circumference --      Peak Flow --      Pain Score 07/19/24 1153 5     Pain Loc --      Pain Education --      Exclude from Growth Chart --     Most recent vital signs: Vitals:   07/19/24 1248 07/19/24 1300  BP:  (!) 114/58  Pulse: (!) 108 (!) 105  Resp: (!) 24 (!) 23  Temp:    SpO2: 92% 94%     Physical Exam Constitutional:      Appearance: He is well-developed.  HENT:     Head: Atraumatic.     Mouth/Throat:     Mouth: Mucous membranes are dry.  Eyes:     Conjunctiva/sclera: Conjunctivae normal.  Cardiovascular:     Rate and Rhythm: Regular rhythm. Tachycardia present.  Pulmonary:     Effort: No respiratory distress.  Abdominal:     Tenderness: There is abdominal tenderness (Suprapubic abdominal tenderness to palpation).  Genitourinary:    Comments: Some bleeding from the scrotum, no obvious rectal bleeding Musculoskeletal:     Cervical back: Normal range of motion.     Right lower leg: No edema.     Left lower leg: No edema.  Skin:    General: Skin is warm.     Capillary Refill: Capillary refill takes less than 2 seconds.  Neurological:     General: No focal deficit present.     Mental Status: He is alert. He is disoriented.  Psychiatric:        Mood and Affect: Mood normal.     IMPRESSION / MDM / ASSESSMENT AND PLAN / ED COURSE  I reviewed the triage vital signs and the nursing notes.  On arrival afebrile, did receive IV Tylenol  with EMS, tachycardic, tachypneic but normotensive and 91% on room air  Differential diagnosis including infectious process, dehydration, electrolyte abnormality, urinary tract infection, viral illness including COVID/influenza  EKG  I, Clotilda Punter, the attending physician, personally viewed and interpreted this ECG.  Sinus tachycardia with a heart rate of 115.  Narrow complex.  Normal intervals.  No chamber enlargement.  Nonspecific ST changes.  Sinus tachycardia while on cardiac telemetry.  RADIOLOGY I independently reviewed imaging, my interpretation of imaging: Chest x-ray no signs of pneumonia.  Read as no acute findings.  LABS (all labs ordered are listed, but only abnormal results are displayed) Labs interpreted as -    Labs Reviewed  LACTIC ACID, PLASMA - Abnormal; Notable for the following components:       Result Value   Lactic Acid, Venous 3.8 (*)    All other components within normal limits  COMPREHENSIVE METABOLIC PANEL WITH GFR - Abnormal; Notable for the following components:   CO2 19 (*)    Glucose, Bld 287 (*)    Total Bilirubin 1.9 (*)    Anion gap 16 (*)    All other components within normal limits  CBC WITH DIFFERENTIAL/PLATELET - Abnormal; Notable for the following components:   WBC 17.4 (*)    Neutro Abs 15.6 (*)    Lymphs Abs 0.6 (*)    Abs Immature Granulocytes 0.29 (*)    All other components within normal limits  URINALYSIS, W/ REFLEX TO CULTURE (INFECTION SUSPECTED) - Abnormal; Notable for the following components:   Color, Urine AMBER (*)    APPearance HAZY (*)    Specific Gravity, Urine 1.035 (*)    Glucose, UA >=500 (*)    Hgb urine dipstick MODERATE (*)    Ketones, ur 5 (*)    Protein, ur 100 (*)    Nitrite POSITIVE (*)    Leukocytes,Ua SMALL (*)    Bacteria, UA RARE (*)    All other components within normal limits  RESP PANEL BY RT-PCR (RSV, FLU A&B, COVID)  RVPGX2  CULTURE, BLOOD (ROUTINE X 2)  CULTURE, BLOOD (ROUTINE X 2)  URINE CULTURE  PROTIME-INR  LACTIC ACID, PLASMA  BETA-HYDROXYBUTYRIC ACID     MDM  On arrival concern for sepsis.  Blood cultures obtained.  Given his urinary urgency and frequency with suprapubic tenderness concern for urinary tract infection was started on IV Rocephin .  On chart review past urine culture was sensitive to Rocephin .  Both of 30 cc/kg's of IV fluids may be detrimental to the patient given his good perfusion, given a 1 L bolus and will reevaluate.  Lab work with leukocytosis of 17.4 hyperglycemia with a glucose of 287, CO2 is 19, mild anion gap of 16 and elevated T. bili at 1.9 which is likely reactive.  Normal LFTs.  Creatinine at baseline.  Findings concerning for urinary tract infection.  Positive nitrites and leukocyte esterase with greater than 50 WBCs.  Does have some ketones in the urine.  Chest x-ray no signs  of pneumonia.  Initial lactic acid is 3.8.  After 1 L of IV fluids continued to be tachycardic, will give another 1 L of IV fluids.  Consulted hospitalist for admission for sepsis secondary to urinary tract infection.  PROCEDURES:  Critical Care performed: yes  .Critical Care  Performed by: Suzanne Kirsch, MD Authorized by: Suzanne Kirsch, MD   Critical care provider statement:    Critical care time (minutes):  30   Critical care time was exclusive of:  Separately billable procedures and treating other patients   Critical care was necessary to treat or prevent imminent or life-threatening deterioration of the following conditions:  Sepsis   Critical care was time spent personally by me on the following activities:  Development of treatment plan with patient or surrogate, discussions with consultants, evaluation of patient's response to treatment, examination of patient, ordering and review of laboratory studies, ordering and review of radiographic studies, ordering and performing treatments and interventions, pulse oximetry, re-evaluation of patient's condition and review of old charts   Care discussed with: admitting provider     Patient's presentation is most consistent with acute presentation with potential threat to life or bodily function.   MEDICATIONS ORDERED IN ED: Medications  lactated ringers  infusion (has no administration in time range)  lactated ringers  bolus 1,000 mL (has no administration in time range)  lactated ringers  bolus 1,000 mL (1,000 mLs Intravenous New Bag/Given 07/19/24 1235)  cefTRIAXone  (ROCEPHIN ) 2 g in sodium chloride  0.9 % 100 mL IVPB (0 g Intravenous Stopped 07/19/24 1235)    FINAL CLINICAL IMPRESSION(S) / ED DIAGNOSES   Final diagnoses:  Sepsis without acute organ dysfunction, due to unspecified organism (HCC)  Acute cystitis without hematuria  Hyperglycemia     Rx / DC Orders   ED Discharge Orders     None        Note:  This  document was prepared using Dragon voice recognition software and may include unintentional dictation errors.   Suzanne Kirsch, MD 07/19/24 1417  "

## 2024-07-19 NOTE — H&P (Signed)
 "  History and Physical    Chad Combs FMW:[id number] DOB: 11-12-1955 DOA: 07/19/2024  DOS: the patient was seen and examined on 07/19/2024  PCP: Laurine Gladden, MD   Patient coming from: SNF  I have personally briefly reviewed patient's old medical records in [facility]  Chief Complaint: Suprapubic pain and fever  HPI: Chad Combs is a pleasant  69 y.o. male [facility] resident with medical history significant for COPD not on home oxygen, history of stroke with residual left-sided hemiparesis, DVT/PE, AAA, seizure disorder HTN, tobacco abuse, PVD, depression, HFpEF, DM who presented to ED with suprapubic discomfort and concern for infection.  Patient stated that he was feeling poorly this morning.  Complaining of burning urination and pain over the suprapubic area.  Patient also stated that he had a cough and congestion.  Facility would give him nebulizer but without improvement.  Patient has history of seizure but there was none like that. EMS noted temperature of 103.1, given 1 g of Tylenol , tachycardic at 120 and saturating 90% on room air.  Glucose was greater than 500 so they gave him 10 units of insulin  prior to EMS arrival. Patient denied any chest pain, palpitation, nausea, vomiting, diarrhea.  He denies any hematemesis or melena.  ED Course: Upon arrival to the ED, patient is found to be tachycardic around 108 and tachypneic around 24, lactic acidosis with 3.8, bicarb of 19 white count of 17.4, urine analysis was positive for UTI.  Respiratory viral panel was checked which was negative, blood cultures were sent, urine cultures were sent, patient received ceftriaxone  and hospitalist service was consulted for evaluation for admission.  Review of Systems:  ROS  All other systems negative except as noted in the HPI.  Past Medical History:  Diagnosis Date   AAA (abdominal aortic aneurysm)    a. 08/2016 CTA: 3.1cm saccular aneurysm of distal Ao arch w/ penetrating  atheromatous ulcer. 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 09/2017 CTA chest/abd/pelvis [facility]): 6.3cm infrarenal AAA, unchanged bilateral CIA aneurysms (R 3.5cm, L 2.8cm)-->pt refused intervention.   Anemia    Blind right eye    Blind right eye    Carotid arterial disease    a. 12/2016 CTA Neck: RICA 100%. LICA >75%; b. 01/2017 s/p trans aortic LICA revascularization and stent placement [facility]); c. 03/2017 Carotid [facility]): No significant LICA stenosis, patent stent.   COPD (chronic obstructive pulmonary disease) (HCC)    Depression    Diabetes mellitus without complication (HCC)    Diastolic dysfunction    a. 08/2016 Echo: EF 60%, Gr1 DD, mild MR, mildly dil LA/RV. No cardiac source of emboli.   Femoral DVT (deep venous thrombosis) (HCC)    a. 08/2016 L Fem Vein DVT ->xarelto /IVC filter. Xarelto  subsequently d/c'd.   Hemiparesis (HCC)    Hemiplegia (HCC) left   History of pulmonary embolism    a. 08/2016 following CVA-->chronic xarelto , s/p IVC filter. Xarelto  subsequently d/c'd.   History of right MCA stroke    a. 08/2016 MRI: R MCA infarct w/ occlusion of RICA & R MCA.    Hypertension    Stroke [facility])    Tobacco abuse     Past Surgical History:  Procedure Laterality Date   CAROTID ENDARTERECTOMY Left    CATARACT EXTRACTION W/PHACO Left 07/29/2020   Procedure: CATARACT EXTRACTION PHACO AND INTRAOCULAR LENS PLACEMENT (IOC) LEFT VISION BLUE 9.69 00:54.3;  Surgeon: Jaye Fallow,  MD;  Location: [facility];  Service: Ophthalmology;  Laterality: Left;   COLONOSCOPY WITH PROPOFOL  N/A 05/26/2020   Procedure: COLONOSCOPY WITH PROPOFOL ;  Surgeon: Maryruth Ole DASEN, MD;  Location: [facility] ENDOSCOPY;  Service: Endoscopy;  Laterality: N/A;   ENDOVASCULAR STENT GRAFT (AAA) N/A 01/29/2020   Procedure: ENDOVASCULAR REPAIR/STENT GRAFT;  Surgeon: Jama Cordella MATSU, MD;  Location: [facility] INVASIVE CV LAB;  Service:  Cardiovascular;  Laterality: N/A;   ESOPHAGOGASTRODUODENOSCOPY (EGD) WITH PROPOFOL  N/A 09/16/2020   Procedure: ESOPHAGOGASTRODUODENOSCOPY (EGD) WITH PROPOFOL ;  Surgeon: Therisa Bi, MD;  Location: [facility] ENDOSCOPY;  Service: Gastroenterology;  Laterality: N/A;   IVC FILTER INSERTION N/A 08/24/2016   Procedure: IVC Filter Insertion;  Surgeon: Cordella MATSU Jama, MD;  Location: [facility] INVASIVE CV LAB;  Service: Cardiovascular;  Laterality: N/A;     reports that he quit smoking about 7 years ago. His smoking use included cigarettes. He started smoking about 52 years ago. He has a 67.5 pack-year smoking history. He has never used smokeless tobacco. He reports current alcohol  use. He reports that he does not use drugs.  Allergies[1]  Family History  Problem Relation Age of Onset   Cancer Mother    Heart disease Father     Prior to Admission medications  Medication Sig Start Date End Date Taking? Authorizing Provider  acetaminophen  (TYLENOL ) 650 MG CR tablet Take 650 mg by mouth 3 (three) times daily.    [provider]  ARTIFICIAL TEARS 0.2-0.2-1 % SOLN Place 1 drop into the left eye 3 (three) times daily. 09/01/20   [provider]  aspirin  81 MG chewable tablet Chew 81 mg by mouth daily.    [provider]  atorvastatin  (LIPITOR) 20 MG tablet Take 20 mg by mouth daily. 01/24/20   [provider]  budesonide-formoterol  (SYMBICORT) 80-4.5 MCG/ACT inhaler Inhale 2 puffs into the lungs every 12 (twelve) hours.    [provider]  clopidogrel  (PLAVIX ) 75 MG tablet Take 1 tablet (75 mg total) by mouth daily. Resume on 09/20/2020 if CBC stable 09/20/20   Briana Elgin LABOR, MD  divalproex  (DEPAKOTE ) 125 MG DR tablet Take 125 mg by mouth at bedtime. 01/24/20   [provider]  docusate sodium  (COLACE) 100 MG capsule Take 1 capsule (100 mg total) by mouth daily. 01/31/20   Swayze, Ava, DO  Dulaglutide  (TRULICITY ) 1.5 MG/0.5ML SOPN Inject 1.5 mg into the skin once a  week. Friday    [provider]  ezetimibe  (ZETIA ) 10 MG tablet Take 1 tablet (10 mg total) by mouth daily at 6 PM. Patient taking differently: Take 10 mg by mouth daily. 10/01/16   Love, Sharlet RAMAN, PA-C  gabapentin  (NEURONTIN ) 300 MG capsule Take 300 mg by mouth daily. 01/24/20   [provider]  ketorolac  (ACULAR ) 0.4 % SOLN Place 1 drop into the left eye 2 (two) times daily. 09/01/20   [provider]  LEVEMIR  FLEXTOUCH 100 UNIT/ML FlexPen Inject 18 Units into the skin at bedtime. 01/21/20   [provider]  levETIRAcetam  (KEPPRA ) 250 MG tablet Take 250 mg by mouth 2 (two) times daily. 01/24/20   [provider]  melatonin 3 MG TABS tablet Take 3 mg by mouth at bedtime.    [provider]  metoprolol  tartrate (LOPRESSOR ) 25 MG tablet Take 0.5 tablets (12.5 mg total) by mouth 2 (two) times daily. 10/06/16   Tish Elsie FALCON, MD  ondansetron  (ZOFRAN ) 4 MG tablet Take 4 mg by mouth at bedtime. 01/22/20   [provider]  pantoprazole  (PROTONIX ) 40 MG tablet Take 1 tablet (40 mg total) by mouth 2 (two) times daily for 90 days, THEN 1 tablet (40 mg total) daily. 09/17/20 01/15/21  Briana Elgin LABOR, MD  polyethylene glycol (MIRALAX  / GLYCOLAX ) 17 g packet Take 17 g by mouth daily.    [provider]  potassium chloride  SA (KLOR-CON ) 20 MEQ tablet Take 20 mEq by mouth 2 (two) times daily. 01/23/20   [provider]  prednisoLONE  acetate (PRED FORTE ) 1 % ophthalmic suspension Place 1 drop into the left eye 2 (two) times daily. 09/01/20   [provider]  senna (SENOKOT) 8.6 MG TABS tablet Take 2 tablets by mouth at bedtime.    [provider]  sertraline  (ZOLOFT ) 100 MG tablet Take 100 mg by mouth daily. 01/24/20   [provider]  simethicone  (MYLICON) 125 MG chewable tablet Chew 125 mg by mouth 3 (three) times daily.    [provider]  tamsulosin  (FLOMAX ) 0.4 MG CAPS capsule Take 1 capsule (0.4 mg total)  by mouth daily after supper. 10/01/16   Maurice Sharlet RAMAN, PA-C    Physical Exam: Vitals:   07/19/24 1248 07/19/24 1300 07/19/24 1400 07/19/24 1430  BP:  (!) 114/58 91/75 (!) 128/56  Pulse: (!) 108 (!) 105 99   Resp: (!) 24 (!) 23  (!) 21  Temp:      TempSrc:      SpO2: 92% 94% 92%     Physical Exam   Constitutional: Alert, awake, calm, comfortable HEENT: Neck supple Respiratory: Clear to auscultation B/L, no wheezing, no rales.  Cardiovascular: Regular rate and rhythm, no murmurs / rubs / gallops. No extremity edema. 2+ pedal pulses. No carotid bruits.  Abdomen: Soft, no tenderness, Bowel sounds positive.  Musculoskeletal: no clubbing / cyanosis. Good ROM, no contractures. Normal muscle tone.  Skin: no rashes, lesions, ulcers. Neurologic: Left-sided hemiparesis Psychiatric: Alert and oriented x 3. Normal mood.    Labs on Admission: I have personally reviewed following labs and imaging studies  CBC: Recent Labs  Lab 07/19/24 1200  WBC 17.4*  NEUTROABS 15.6*  HGB 14.7  HCT 45.3  MCV 99.6  PLT 159   Basic Metabolic Panel: Recent Labs  Lab 07/19/24 1200  NA 138  K 3.8  CL 103  CO2 19*  GLUCOSE 287*  BUN 12  CREATININE 1.20  CALCIUM  8.9   GFR: CrCl cannot be calculated (Unknown ideal weight.). Liver Function Tests: Recent Labs  Lab 07/19/24 1200  AST 26  ALT 31  ALKPHOS 88  BILITOT 1.9*  PROT 7.3  ALBUMIN 4.0   No results for input(s): LIPASE, AMYLASE in the last 168 hours. No results for input(s): AMMONIA in the last 168 hours. Coagulation Profile: Recent Labs  Lab 07/19/24 1200  INR 1.1   Cardiac Enzymes: No results for input(s): CKTOTAL, CKMB, CKMBINDEX, TROPONINI, TROPONINIHS in the last 168 hours. BNP (last 3 results) No results for input(s): BNP in the last 8760 hours. HbA1C: No results for input(s): HGBA1C in the last 72 hours. CBG: No results for input(s): GLUCAP in the last 168 hours. Lipid Profile: No results  for input(s): CHOL, HDL, LDLCALC, TRIG, CHOLHDL, LDLDIRECT in the last 72 hours. Thyroid Function Tests: No results for input(s): TSH, T4TOTAL, FREET4, T3FREE, THYROIDAB in the last 72 hours. Anemia Panel: No results for input(s): VITAMINB12, FOLATE, FERRITIN, TIBC, IRON, RETICCTPCT in the last 72 hours. Urine analysis:    Component Value Date/Time   COLORURINE AMBER (A)  07/19/2024 1201   APPEARANCEUR HAZY (A) 07/19/2024 1201   LABSPEC 1.035 (H) 07/19/2024 1201   PHURINE 5.0 07/19/2024 1201   GLUCOSEU >=500 (A) 07/19/2024 1201   HGBUR MODERATE (A) 07/19/2024 1201   BILIRUBINUR NEGATIVE 07/19/2024 1201   KETONESUR 5 (A) 07/19/2024 1201   PROTEINUR 100 (A) 07/19/2024 1201   NITRITE POSITIVE (A) 07/19/2024 1201   LEUKOCYTESUR SMALL (A) 07/19/2024 1201    Radiological Exams on Admission: I have personally reviewed images DG Chest Port 1 View Result Date: 07/19/2024 CLINICAL DATA:  Questionable sepsis. EXAM: PORTABLE CHEST 1 VIEW COMPARISON:  Chest radiograph dated 06/10/2022. FINDINGS: No focal consolidation, pleural effusion or pneumothorax. The cardiac silhouette is within normal limits. No acute osseous pathology. IMPRESSION: No active disease. Electronically Signed   By: Vanetta Chou M.D.   On: 07/19/2024 13:34    EKG: My personal interpretation of EKG shows: Sinus tachycardia    Assessment/Plan Principal Problem:   UTI (urinary tract infection) Active Problems:   Left hemiplegia (HCC)   Benign essential HTN   Pulmonary embolus (HCC)   Abdominal aortic aneurysm (AAA) greater than 5.5 cm in diameter in male   Type 2 diabetes mellitus with hyperlipidemia (HCC)    Assessment and Plan: 69 year old male with history of prior stroke with residual left-sided hemiparesis, HTN, DM, HLD, AAA, pulmonary embolism who was brought into ED for concern of infection.  1.  UTI with sepsis present on admission -Patient received IV fluid and antibiotics  and cultures per sepsis protocol - Continue IV antibiotics ceftriaxone  - Follow cultures - Will give him gentle hydration  2.  DM2/hyperglycemia - He will be placed on insulin  sliding scale and Lantus  at home dose - IV fluid may help some. - Continue to monitor blood sugars  3.  History of stroke with left hemiplegia - Continue aspirin  and Plavix  - Supportive care  4.  HTN/HLD - Resume home medications metoprolol  at 12.5 mg 2 times a day - Continue Lipitor at home - Continue to monitor blood sugars  5.  GERD - Continue Protonix   6.  Insomnia - Continue melatonin  7.  History of seizure disorder - Continue Keppra  and valproic acid at home dose  8. depression - Continue Zoloft      DVT prophylaxis: Lovenox  Code Status: Full Code Family Communication: None  Disposition Plan: Back to skilled nursing facility Consults called: None Admission status: Observation, Telemetry bed   Nena Rebel, MD [facility] 07/19/2024, 3:40 PM        [1]  Allergies Allergen Reactions   Bee Pollen Hives and Swelling   "

## 2024-07-19 NOTE — Progress Notes (Signed)
 CODE SEPSIS - PHARMACY COMMUNICATION  **Broad Spectrum Antibiotics should be administered within 1 hour of Sepsis diagnosis**  Time Code Sepsis Called/Page Received: 1201  Antibiotics Ordered: ceftriaxone   Time of 1st antibiotic administration: 1213  Additional action taken by pharmacy: none needed  If necessary, Name of Provider/Nurse Contacted: none needed    Leonor JAYSON Argyle, PharmD Pharmacy Resident  07/19/2024 12:09 PM

## 2024-07-20 DIAGNOSIS — D696 Thrombocytopenia, unspecified: Secondary | ICD-10-CM | POA: Diagnosis not present

## 2024-07-20 DIAGNOSIS — E872 Acidosis, unspecified: Secondary | ICD-10-CM | POA: Diagnosis not present

## 2024-07-20 DIAGNOSIS — G8194 Hemiplegia, unspecified affecting left nondominant side: Secondary | ICD-10-CM | POA: Diagnosis not present

## 2024-07-20 DIAGNOSIS — F3289 Other specified depressive episodes: Secondary | ICD-10-CM | POA: Diagnosis not present

## 2024-07-20 DIAGNOSIS — F32A Depression, unspecified: Secondary | ICD-10-CM | POA: Insufficient documentation

## 2024-07-20 DIAGNOSIS — E7849 Other hyperlipidemia: Secondary | ICD-10-CM

## 2024-07-20 DIAGNOSIS — Z794 Long term (current) use of insulin: Secondary | ICD-10-CM

## 2024-07-20 DIAGNOSIS — N3001 Acute cystitis with hematuria: Secondary | ICD-10-CM | POA: Diagnosis not present

## 2024-07-20 DIAGNOSIS — A419 Sepsis, unspecified organism: Secondary | ICD-10-CM | POA: Diagnosis not present

## 2024-07-20 DIAGNOSIS — R6520 Severe sepsis without septic shock: Secondary | ICD-10-CM

## 2024-07-20 DIAGNOSIS — E1165 Type 2 diabetes mellitus with hyperglycemia: Secondary | ICD-10-CM | POA: Diagnosis not present

## 2024-07-20 DIAGNOSIS — I10 Essential (primary) hypertension: Secondary | ICD-10-CM

## 2024-07-20 DIAGNOSIS — Z87898 Personal history of other specified conditions: Secondary | ICD-10-CM

## 2024-07-20 LAB — COMPREHENSIVE METABOLIC PANEL WITH GFR
ALT: 20 U/L (ref 0–44)
AST: 18 U/L (ref 15–41)
Albumin: 3.3 g/dL — ABNORMAL LOW (ref 3.5–5.0)
Alkaline Phosphatase: 62 U/L (ref 38–126)
Anion gap: 11 (ref 5–15)
BUN: 11 mg/dL (ref 8–23)
CO2: 22 mmol/L (ref 22–32)
Calcium: 8.3 mg/dL — ABNORMAL LOW (ref 8.9–10.3)
Chloride: 103 mmol/L (ref 98–111)
Creatinine, Ser: 1.07 mg/dL (ref 0.61–1.24)
GFR, Estimated: >60 mL/min
Glucose, Bld: 203 mg/dL — ABNORMAL HIGH (ref 70–99)
Potassium: 3.4 mmol/L — ABNORMAL LOW (ref 3.5–5.1)
Sodium: 135 mmol/L (ref 135–145)
Total Bilirubin: 1.3 mg/dL — ABNORMAL HIGH (ref 0.0–1.2)
Total Protein: 6.6 g/dL (ref 6.5–8.1)

## 2024-07-20 LAB — CBC
HCT: 37.4 % — ABNORMAL LOW (ref 39.0–52.0)
Hemoglobin: 12.7 g/dL — ABNORMAL LOW (ref 13.0–17.0)
MCH: 33.2 pg (ref 26.0–34.0)
MCHC: 34 g/dL (ref 30.0–36.0)
MCV: 97.9 fL (ref 80.0–100.0)
Platelets: 126 10*3/uL — ABNORMAL LOW (ref 150–400)
RBC: 3.82 MIL/uL — ABNORMAL LOW (ref 4.22–5.81)
RDW: 13.2 % (ref 11.5–15.5)
WBC: 12.7 10*3/uL — ABNORMAL HIGH (ref 4.0–10.5)
nRBC: 0 % (ref 0.0–0.2)

## 2024-07-20 LAB — GLUCOSE, CAPILLARY
Glucose-Capillary: 263 mg/dL — ABNORMAL HIGH (ref 70–99)
Glucose-Capillary: 265 mg/dL — ABNORMAL HIGH (ref 70–99)
Glucose-Capillary: 223 mg/dL — ABNORMAL HIGH (ref 70–99)
Glucose-Capillary: 247 mg/dL — ABNORMAL HIGH (ref 70–99)

## 2024-07-20 LAB — HIV ANTIBODY (ROUTINE TESTING W REFLEX): HIV Screen 4th Generation wRfx: NONREACTIVE

## 2024-07-20 LAB — HEMOGLOBIN A1C
Hgb A1c MFr Bld: 9.9 % — ABNORMAL HIGH (ref 4.8–5.6)
Mean Plasma Glucose: 237.43 mg/dL

## 2024-07-20 MED ORDER — NYSTATIN 100000 UNIT/GM EX POWD
1.0000 | Freq: Two times a day (BID) | CUTANEOUS | Status: DC
  Administered 2024-07-20 – 2024-07-22 (×5): 1 via TOPICAL
  Filled 2024-07-20: qty 15

## 2024-07-20 MED ORDER — PREGABALIN 25 MG PO CAPS
25.0000 mg | ORAL_CAPSULE | Freq: Every day | ORAL | Status: DC
  Administered 2024-07-21 – 2024-07-22 (×2): 25 mg via ORAL
  Filled 2024-07-20 (×2): qty 1

## 2024-07-20 MED ORDER — DICLOFENAC SODIUM 1 % EX GEL
2.0000 g | Freq: Three times a day (TID) | CUTANEOUS | Status: DC
  Administered 2024-07-20 – 2024-07-22 (×7): 2 g via TOPICAL
  Filled 2024-07-20: qty 100

## 2024-07-20 MED ORDER — INSULIN GLARGINE-YFGN 100 UNIT/ML ~~LOC~~ SOLN
15.0000 [IU] | Freq: Every day | SUBCUTANEOUS | Status: DC
  Administered 2024-07-20 – 2024-07-21 (×2): 15 [IU] via SUBCUTANEOUS
  Filled 2024-07-20 (×3): qty 0.15

## 2024-07-20 MED ORDER — POTASSIUM CHLORIDE CRYS ER 20 MEQ PO TBCR
40.0000 meq | EXTENDED_RELEASE_TABLET | Freq: Once | ORAL | Status: AC
  Administered 2024-07-20: 40 meq via ORAL
  Filled 2024-07-20: qty 2

## 2024-07-20 NOTE — NC FL2 (Signed)
 " [place]  MEDICAID FL2 LEVEL OF CARE FORM     IDENTIFICATION  Patient Name: Chad Combs Birthdate: 01-03-1956 Sex: male Admission Date (Current Location): 07/19/2024  [facility] and [place] Number:  Chiropodist and Address:  [facility], [address] [place], [place] [address]      Provider Number: [id number]  Attending Physician Name and Address:  Josette Ade, MD  Relative Name and Phone Number:  Elsie Harbour- son- [phone]    Current Level of Care: Hospital Recommended Level of Care: Skilled Nursing Facility Prior Approval Number:    Date Approved/Denied:   PASRR Number:    Discharge Plan: SNF    Current Diagnoses: Patient Active Problem List   Diagnosis Date Noted   Esophagitis 09/17/2020   Hypotension 09/17/2020   Upper GI bleed 09/13/2020   Acute colitis 01/24/2020   Hematochezia 01/24/2020   Chest pain 02/17/2018   UTI (urinary tract infection) 05/19/2017   Seizure-like activity (HCC) 05/19/2017   Hyperlipidemia 11/08/2016   Type II diabetes mellitus with neurological manifestations, uncontrolled 10/04/2016   Type 2 diabetes mellitus with hyperlipidemia (HCC) 10/04/2016   Abdominal aortic aneurysm (AAA) greater than 5.5 cm in diameter in male    Acute deep vein thrombosis (DVT) of femoral vein of left lower extremity (HCC)    Pulmonary embolus (HCC)    Adjustment disorder    Acute right MCA stroke (HCC) 08/25/2016   Left hemiplegia (HCC)    Dysarthria, post-stroke    Dysphagia, post-stroke    Carotid stenosis    Benign essential HTN    Leukocytosis    Polycythemia vera (HCC)    Tobacco abuse    Chronic obstructive pulmonary disease (HCC)    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    Orientation RESPIRATION BLADDER Height & Weight     Self, Time, Situation, Place  Normal Incontinent Weight: 90.9 kg Height:  5' 9 (175.3 cm)  BEHAVIORAL SYMPTOMS/MOOD NEUROLOGICAL BOWEL NUTRITION STATUS      Incontinent Diet  (regular)  AMBULATORY STATUS COMMUNICATION OF NEEDS Skin   Extensive Assist Verbally Normal                       Personal Care Assistance Level of Assistance  Bathing, Feeding, Dressing Bathing Assistance: Maximum assistance Feeding assistance: Limited assistance Dressing Assistance: Maximum assistance     Functional Limitations Info  Sight, Hearing Sight Info: Impaired Hearing Info: Impaired      SPECIAL CARE FACTORS FREQUENCY                       Contractures Contractures Info: Not present    Additional Factors Info  Code Status, Allergies Code Status Info: Full Allergies Info: Bee Pollen           Current Medications (07/20/2024):  This is the current hospital active medication list Current Facility-Administered Medications  Medication Dose Route Frequency Provider Last Rate Last Admin   0.9 %  sodium chloride  infusion   Intravenous Continuous Paudel, Keshab, MD 75 mL/hr at 07/19/24 2158 New Bag at 07/19/24 2158   acetaminophen  (TYLENOL ) tablet 650 mg  650 mg Oral Q6H PRN Paudel, Keshab, MD       Or   acetaminophen  (TYLENOL ) suppository 650 mg  650 mg Rectal Q6H PRN Paudel, Nena, MD       alum & mag hydroxide-simeth (MAALOX/MYLANTA) 200-200-20 MG/5ML suspension 30 mL  30 mL Oral Q4H PRN Roann Nena, MD  artificial tears ophthalmic solution 1 drop  1 drop Left Eye TID Paudel, Keshab, MD   1 drop at 07/19/24 2152   artificial tears ophthalmic solution 1 drop  1 drop Both Eyes Q6H PRN Paudel, Keshab, MD       aspirin  chewable tablet 81 mg  81 mg Oral Daily Paudel, Nena, MD   81 mg at 07/19/24 1743   atorvastatin  (LIPITOR) tablet 10 mg  10 mg Oral QHS Paudel, Nena, MD   10 mg at 07/19/24 2141   cefTRIAXone  (ROCEPHIN ) 2 g in sodium chloride  0.9 % 100 mL IVPB  2 g Intravenous Q24H Paudel, Nena, MD       clopidogrel  (PLAVIX ) tablet 75 mg  75 mg Oral Daily Paudel, Nena, MD   75 mg at 07/19/24 1743   divalproex  (DEPAKOTE ) DR tablet 125 mg  125  mg Oral QHS Paudel, Keshab, MD   125 mg at 07/19/24 2152   enoxaparin  (LOVENOX ) injection 40 mg  40 mg Subcutaneous Q24H Paudel, Nena, MD   40 mg at 07/19/24 2142   gabapentin  (NEURONTIN ) capsule 300 mg  300 mg Oral QHS Paudel, Nena, MD   300 mg at 07/19/24 2141   insulin  aspart (novoLOG ) injection 0-5 Units  0-5 Units Subcutaneous QHS Paudel, Keshab, MD   3 Units at 07/19/24 2141   insulin  aspart (novoLOG ) injection 0-9 Units  0-9 Units Subcutaneous TID WC Paudel, Keshab, MD       insulin  glargine-yfgn injection 10 Units  10 Units Subcutaneous QHS Paudel, Keshab, MD   10 Units at 07/19/24 2152   levETIRAcetam  (KEPPRA ) tablet 250 mg  250 mg Oral BID Paudel, Keshab, MD   250 mg at 07/19/24 2152   melatonin tablet 5 mg  5 mg Oral QHS Paudel, Keshab, MD   5 mg at 07/19/24 2141   metoprolol  tartrate (LOPRESSOR ) tablet 12.5 mg  12.5 mg Oral BID Paudel, Keshab, MD   12.5 mg at 07/19/24 2141   morphine  (PF) 2 MG/ML injection 2 mg  2 mg Intravenous Q4H PRN Paudel, Keshab, MD       ondansetron  (ZOFRAN ) tablet 4 mg  4 mg Oral Q6H PRN Paudel, Keshab, MD       Or   ondansetron  (ZOFRAN ) injection 4 mg  4 mg Intravenous Q6H PRN Paudel, Nena, MD       oxyCODONE  (Oxy IR/ROXICODONE ) immediate release tablet 5 mg  5 mg Oral Q4H PRN Paudel, Keshab, MD       pantoprazole  (PROTONIX ) EC tablet 40 mg  40 mg Oral Daily Paudel, Keshab, MD   40 mg at 07/19/24 1744   polyethylene glycol (MIRALAX  / GLYCOLAX ) packet 17 g  17 g Oral Daily PRN Paudel, Keshab, MD       sertraline  (ZOLOFT ) tablet 125 mg  125 mg Oral Daily Paudel, Keshab, MD   125 mg at 07/19/24 1743   tamsulosin  (FLOMAX ) capsule 0.4 mg  0.4 mg Oral QPC supper Paudel, Keshab, MD   0.4 mg at 07/19/24 1743     Discharge Medications: Please see discharge summary for a list of discharge medications.  Relevant Imaging Results:  Relevant Lab Results:   Additional Information SS#: [id number]  Nathanael CHRISTELLA Ring, RN     "

## 2024-07-20 NOTE — Assessment & Plan Note (Signed)
 Present on admission with lactic acidosis, tachycardia, leukocytosis and acute cystitis with hematuria.  On empiric Rocephin .  Urine culture did not grow out any significant growth despite urine analysis being positive.  White blood cell count and lactic acid normalized.  Blood cultures so far negative.  Patient stable for disposition.

## 2024-07-20 NOTE — Assessment & Plan Note (Signed)
 Likely secondary to sepsis.

## 2024-07-20 NOTE — Inpatient Diabetes Management (Signed)
 Inpatient Diabetes Program Recommendations  AACE/ADA: New Consensus Statement on Inpatient Glycemic Control (2015)  Target Ranges:  Prepandial:   less than 140 mg/dL      Peak postprandial:   less than 180 mg/dL (1-2 hours)      Critically ill patients:  140 - 180 mg/dL    Latest Reference Range & Units 07/19/24 12:00  Hemoglobin A1C 4.8 - 5.6 % 9.9 (H)  237 mg/dl    Latest Reference Range & Units 07/19/24 21:18 07/20/24 09:35 07/20/24 13:55  Glucose-Capillary 70 - 99 mg/dL 727 (H)  3 units Novolog   10 units Semglee  263 (H)  5 units Novolog  265 (H)  (H): Data is abnormally high   Admit with: UTI with sepsis   History: DM, CVA with Hemiplegia  SNF DM Meds: Trulicity  1.5 mg Qweek     Lantus  10 units at HS        Current Orders: Novolog  Sensitive Correction Scale/ SSI (0-9 units) TID AC + HS      Semglee  10 units at HS    MD- Please consider:  1. Increase Semglee  to 15 units at HS  2. Increase the Novolog  SSI to the 0-15 unit Moderate scale    --Will follow patient during hospitalization--  Adina Rudolpho Arrow RN, MSN, CDCES Diabetes Coordinator Inpatient Glycemic Control Team Team Pager: [phone] (8a-5p)

## 2024-07-20 NOTE — Assessment & Plan Note (Addendum)
 History of stroke.  On aspirin , Plavix  and Lipitor

## 2024-07-20 NOTE — Assessment & Plan Note (Signed)
 On Zoloft.

## 2024-07-20 NOTE — Assessment & Plan Note (Signed)
 Increased Lantus  insulin  to 18 units at night.  Check fingersticks 3 times daily with meals and anytime having symptoms.

## 2024-07-20 NOTE — Assessment & Plan Note (Signed)
 On Lipitor

## 2024-07-20 NOTE — Assessment & Plan Note (Signed)
 On Keppra  and valproic acid

## 2024-07-20 NOTE — Care Management Obs Status (Signed)
 MEDICARE OBSERVATION STATUS NOTIFICATION   Patient Details  Name: Chad Combs MRN: [id number] Date of Birth: 06/04/1956   Medicare Observation Status Notification Given:  Yes    Britiny Defrain 07/20/2024, 11:12 AM

## 2024-07-20 NOTE — Hospital Course (Signed)
 69 y.o. male [facility] resident with medical history significant for COPD not on home oxygen, history of stroke with residual left-sided hemiparesis, DVT/PE, AAA, seizure disorder HTN, tobacco abuse, PVD, depression, HFpEF, DM who presented to ED with suprapubic discomfort and concern for infection.  Patient stated that he was feeling poorly this morning.  Complaining of burning urination and pain over the suprapubic area.  Patient also stated that he had a cough and congestion.  Facility would give him nebulizer but without improvement.  Patient has history of seizure but there was none like that. EMS noted temperature of 103.1, given 1 g of Tylenol , tachycardic at 120 and saturating 90% on room air.  Glucose was greater than 500 so they gave him 10 units of insulin  prior to EMS arrival. Patient denied any chest pain, palpitation, nausea, vomiting, diarrhea.  He denies any hematemesis or melena.   ED Course: Upon arrival to the ED, patient is found to be tachycardic around 108 and tachypneic around 24, lactic acidosis with 3.8, bicarb of 19 white count of 17.4, urine analysis was positive for UTI.  Respiratory viral panel was checked which was negative, blood cultures were sent, urine cultures were sent, patient received ceftriaxone  and hospitalist service was consulted for evaluation for admission.  1/30.  White blood cell count trending better at 12.7.  No repeat lactic acid today.  Urine culture still pending. 1/31.  Urine culture did not grow out any significant growth despite urine analysis being positive.  Continue antibiotics for now.  Lactic acid down to 1.0 and white blood cell count normal at 7.7.  Patient stable for disposition but transportation not wanting to facilities today secondary to the snow. 2/1.  May be able to set up transportation.

## 2024-07-20 NOTE — TOC Initial Note (Signed)
 Transition of Care [facility]) - Initial/Assessment Note    Patient Details  Name: Chad Combs MRN: [id number] Date of Birth: 1956/02/18  Transition of Care [facility]) CM/SW Contact:    Nathanael CHRISTELLA Ring, RN Phone Number: 07/20/2024, 8:32 AM  Clinical Narrative:                 Patient is long term care at [facility].  He has been there since 2018.  Plan will be to return at [place].   Expected Discharge Plan: Skilled Nursing Facility Barriers to Discharge: Continued Medical Work up   Patient Goals and CMS Choice            Expected Discharge Plan and Services   Discharge Planning Services: CM Consult   Living arrangements for the past 2 months: Skilled Nursing Facility                 DME Arranged: N/A         HH Arranged: NA          Prior Living Arrangements/Services Living arrangements for the past 2 months: Skilled Nursing Facility Lives with:: Facility Resident Patient language and need for interpreter reviewed:: Yes Do you feel safe going back to the place where you live?: Yes      Need for Family Participation in Patient Care: Yes (Comment) Care giver support system in place?: Yes (comment)   Criminal Activity/Legal Involvement Pertinent to Current Situation/Hospitalization: No - Comment as needed  Activities of Daily Living   ADL Screening (condition at time of admission) Independently performs ADLs?: No Does the patient have a NEW difficulty with bathing/dressing/toileting/self-feeding that is expected to last >3 days?: No Does the patient have a NEW difficulty with getting in/out of bed, walking, or climbing stairs that is expected to last >3 days?: No Does the patient have a NEW difficulty with communication that is expected to last >3 days?: No Is the patient deaf or have difficulty hearing?: No Does the patient have difficulty seeing, even when wearing glasses/contacts?: No Does the patient have difficulty concentrating, remembering, or making decisions?:  No  Permission Sought/Granted Permission sought to share information with : Case Manager, Magazine Features Editor Permission granted to share information with : Yes, Verbal Permission Granted     Permission granted to share info w AGENCY: [facility]        Emotional Assessment       Orientation: : Oriented to Self, Oriented to Place, Oriented to  Time, Oriented to Situation Alcohol  / Substance Use: Not Applicable Psych Involvement: No (comment)  Admission diagnosis:  UTI (urinary tract infection) [N39.0] Hyperglycemia [R73.9] Acute cystitis without hematuria [N30.00] Sepsis without acute organ dysfunction, due to unspecified organism [facility]) [A41.9] Patient Active Problem List   Diagnosis Date Noted   Esophagitis 09/17/2020   Hypotension 09/17/2020   Upper GI bleed 09/13/2020   Acute colitis 01/24/2020   Hematochezia 01/24/2020   Chest pain 02/17/2018   UTI (urinary tract infection) 05/19/2017   Seizure-like activity (HCC) 05/19/2017   Hyperlipidemia 11/08/2016   Type II diabetes mellitus with neurological manifestations, uncontrolled 10/04/2016   Type 2 diabetes mellitus with hyperlipidemia (HCC) 10/04/2016   Abdominal aortic aneurysm (AAA) greater than 5.5 cm in diameter in male    Acute deep vein thrombosis (DVT) of femoral vein of left lower extremity (HCC)    Pulmonary embolus (HCC)    Adjustment disorder    Acute right MCA stroke (HCC) 08/25/2016   Left hemiplegia (HCC)  Dysarthria, post-stroke    Dysphagia, post-stroke    Carotid stenosis    Benign essential HTN    Leukocytosis    Polycythemia vera (HCC)    Tobacco abuse    Chronic obstructive pulmonary disease (HCC)    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   PCP:  Laurine Gladden, MD Pharmacy:   [facility] [phone] - [place], [place] - [address] [place] [address] Phone: [phone] Fax: [phone]     Social Drivers of Health (SDOH) Social History: SDOH Screenings   Food  Insecurity: No Food Insecurity (07/19/2024)  Housing: Low Risk (07/19/2024)  Transportation Needs: No Transportation Needs (07/19/2024)  Utilities: Not At Risk (07/19/2024)  Social Connections: Socially Isolated (07/19/2024)  Tobacco Use: Medium Risk (06/10/2022)   SDOH Interventions:     Readmission Risk Interventions     No data to display

## 2024-07-20 NOTE — Assessment & Plan Note (Signed)
 On metoprolol 

## 2024-07-20 NOTE — Plan of Care (Signed)
" °  Problem: Education: Goal: Ability to describe self-care measures that may prevent or decrease complications (Diabetes Survival Skills Education) will improve Outcome: Progressing   Problem: Coping: Goal: Ability to adjust to condition or change in health will improve Outcome: Progressing   Problem: Nutritional: Goal: Maintenance of adequate nutrition will improve Outcome: Progressing   Problem: Skin Integrity: Goal: Risk for impaired skin integrity will decrease Outcome: Progressing   Problem: Tissue Perfusion: Goal: Adequacy of tissue perfusion will improve Outcome: Progressing   Problem: Activity: Goal: Risk for activity intolerance will decrease Outcome: Progressing   Problem: Nutrition: Goal: Adequate nutrition will be maintained Outcome: Progressing   Problem: Coping: Goal: Level of anxiety will decrease Outcome: Progressing   Problem: Elimination: Goal: Will not experience complications related to bowel motility Outcome: Progressing   Problem: Pain Managment: Goal: General experience of comfort will improve and/or be controlled Outcome: Progressing   Problem: Safety: Goal: Ability to remain free from injury will improve Outcome: Progressing   Problem: Skin Integrity: Goal: Risk for impaired skin integrity will decrease Outcome: Progressing   "

## 2024-07-20 NOTE — Plan of Care (Signed)
  Problem: Education: Goal: Ability to describe self-care measures that may prevent or decrease complications (Diabetes Survival Skills Education) will improve Outcome: Progressing Goal: Individualized Educational Video(s) Outcome: Progressing   Problem: Fluid Volume: Goal: Ability to maintain a balanced intake and output will improve Outcome: Progressing   Problem: Health Behavior/Discharge Planning: Goal: Ability to identify and utilize available resources and services will improve Outcome: Progressing Goal: Ability to manage health-related needs will improve Outcome: Progressing

## 2024-07-21 DIAGNOSIS — E785 Hyperlipidemia, unspecified: Secondary | ICD-10-CM

## 2024-07-21 DIAGNOSIS — E872 Acidosis, unspecified: Secondary | ICD-10-CM | POA: Diagnosis not present

## 2024-07-21 DIAGNOSIS — Z87898 Personal history of other specified conditions: Secondary | ICD-10-CM | POA: Diagnosis not present

## 2024-07-21 DIAGNOSIS — Z794 Long term (current) use of insulin: Secondary | ICD-10-CM | POA: Diagnosis not present

## 2024-07-21 DIAGNOSIS — D696 Thrombocytopenia, unspecified: Secondary | ICD-10-CM | POA: Diagnosis not present

## 2024-07-21 DIAGNOSIS — E1165 Type 2 diabetes mellitus with hyperglycemia: Secondary | ICD-10-CM | POA: Diagnosis not present

## 2024-07-21 DIAGNOSIS — A419 Sepsis, unspecified organism: Secondary | ICD-10-CM | POA: Diagnosis not present

## 2024-07-21 DIAGNOSIS — R6520 Severe sepsis without septic shock: Secondary | ICD-10-CM | POA: Diagnosis not present

## 2024-07-21 DIAGNOSIS — I10 Essential (primary) hypertension: Secondary | ICD-10-CM | POA: Diagnosis not present

## 2024-07-21 DIAGNOSIS — F3289 Other specified depressive episodes: Secondary | ICD-10-CM | POA: Diagnosis not present

## 2024-07-21 LAB — GLUCOSE, CAPILLARY
Glucose-Capillary: 206 mg/dL — ABNORMAL HIGH (ref 70–99)
Glucose-Capillary: 243 mg/dL — ABNORMAL HIGH (ref 70–99)
Glucose-Capillary: 271 mg/dL — ABNORMAL HIGH (ref 70–99)
Glucose-Capillary: 203 mg/dL — ABNORMAL HIGH (ref 70–99)

## 2024-07-21 LAB — URINE CULTURE: Culture: <10000 — AB

## 2024-07-21 LAB — CBC
HCT: 38.8 % — ABNORMAL LOW (ref 39.0–52.0)
Hemoglobin: 12.7 g/dL — ABNORMAL LOW (ref 13.0–17.0)
MCH: 32.8 pg (ref 26.0–34.0)
MCHC: 32.7 g/dL (ref 30.0–36.0)
MCV: 100.3 fL — ABNORMAL HIGH (ref 80.0–100.0)
Platelets: 124 10*3/uL — ABNORMAL LOW (ref 150–400)
RBC: 3.87 MIL/uL — ABNORMAL LOW (ref 4.22–5.81)
RDW: 13 % (ref 11.5–15.5)
WBC: 7.7 10*3/uL (ref 4.0–10.5)
nRBC: 0 % (ref 0.0–0.2)

## 2024-07-21 LAB — LACTIC ACID, PLASMA: Lactic Acid, Venous: 1 mmol/L (ref 0.5–1.9)

## 2024-07-21 NOTE — Plan of Care (Signed)
   Problem: Education: Goal: Ability to describe self-care measures that may prevent or decrease complications (Diabetes Survival Skills Education) will improve Outcome: Progressing Goal: Individualized Educational Video(s) Outcome: Progressing   Problem: Coping: Goal: Ability to adjust to condition or change in health will improve Outcome: Progressing

## 2024-07-21 NOTE — Progress Notes (Signed)
 " Progress Note   Patient: Chad Combs DOB: Sep 12, 1955 DOA: 07/19/2024     1 DOS: the patient was seen and examined on 07/21/2024   Brief hospital course: 69 y.o. male [facility]'s resident with medical history significant for COPD not on home oxygen, history of stroke with residual left-sided hemiparesis, DVT/PE, AAA, seizure disorder HTN, tobacco abuse, PVD, depression, HFpEF, DM who presented to ED with suprapubic discomfort and concern for infection.  Patient stated that he was feeling poorly this morning.  Complaining of burning urination and pain over the suprapubic area.  Patient also stated that he had a cough and congestion.  Facility would give him nebulizer but without improvement.  Patient has history of seizure but there was none like that. EMS noted temperature of 103.1, given 1 g of Tylenol , tachycardic at 120 and saturating 90% on room air.  Glucose was greater than 500 so they gave him 10 units of insulin  prior to EMS arrival. Patient denied any chest pain, palpitation, nausea, vomiting, diarrhea.  He denies any hematemesis or melena.   ED Course: Upon arrival to the ED, patient is found to be tachycardic around 108 and tachypneic around 24, lactic acidosis with 3.8, bicarb of 19 white count of 17.4, urine analysis was positive for UTI.  Respiratory viral panel was checked which was negative, blood cultures were sent, urine cultures were sent, patient received ceftriaxone  and hospitalist service was consulted for evaluation for admission.  1/30.  White blood cell count trending better at 12.7.  No repeat lactic acid today.  Urine culture still pending. 1/31.  Urine culture did not grow out any significant growth despite urine analysis being positive.  Continue antibiotics for now.  Lactic acid down to 1.0 and white blood cell count normal at 7.7.  Patient stable for disposition but transportation not wanting to facilities today secondary to the snow.  Assessment and  Plan: * Severe sepsis (HCC) Present on admission with lactic acidosis, tachycardia, leukocytosis and acute cystitis with hematuria.  On empiric Rocephin .  Urine culture did not grow out any significant growth despite urine analysis being positive.  White blood cell count and lactic acid normalized.  Blood cultures so far negative.  Patient stable for disposition.  Lactic acidosis Lactic acid normalized  Uncontrolled type 2 diabetes mellitus with hyperglycemia, with long-term current use of insulin  (HCC) Increased Lantus  insulin  to 15 units at night.  Continue sliding scale.  Thrombocytopenia Likely secondary to sepsis  Depression On Zoloft   History of seizure On Keppra  and valproic acid  Hyperlipidemia On Lipitor  Benign essential HTN On metoprolol   Left hemiplegia (HCC) History of stroke.  On aspirin , Plavix  and Lipitor        Subjective: Patient feeling fine.  Offers no complaints.  Disappointed that he cannot go back to his facility [birth year].  Admitted with sepsis secondary to urinary infection.  Physical Exam: Vitals:   07/20/24 0333 07/20/24 1213 07/20/24 2032 07/21/24 0431  BP: 113/62 131/68 (!) 153/94 133/76  Pulse: 88 84 83 72  Resp: 18  18 18   Temp: 98.7 F (37.1 C) 98.3 F (36.8 C) 98.2 F (36.8 C) 98.3 F (36.8 C)  TempSrc:      SpO2: 95% 92% 97% 94%  Weight:      Height:       Physical Exam HENT:     Head: Normocephalic.  Eyes:     General: Lids are normal.     Conjunctiva/sclera: Conjunctivae  normal.  Cardiovascular:     Rate and Rhythm: Normal rate and regular rhythm.     Heart sounds: Normal heart sounds, S1 normal and S2 normal.  Pulmonary:     Breath sounds: No decreased breath sounds, wheezing, rhonchi or rales.  Abdominal:     Palpations: Abdomen is soft.     Tenderness: There is no abdominal tenderness.  Musculoskeletal:     Right lower leg: No swelling.     Left lower leg: No swelling.  Skin:    General: Skin is  warm.     Findings: No rash.  Neurological:     Mental Status: He is alert.     Comments: Left-sided problems     Data Reviewed: Urine culture less than 10,000 colonies  Family Communication: Spoke with son on the phone  Disposition: Status is: Inpatient Remains inpatient appropriate because: Transportation not running secondary to snow.  Medically stable at this point  Planned Discharge Destination: Long-term care    Time spent: 27 minutes  Author: Charlie Patterson, MD 07/21/2024 11:11 AM  For on call review [email or web address].  "

## 2024-07-21 NOTE — Assessment & Plan Note (Signed)
 Lactic acid normalized

## 2024-07-22 ENCOUNTER — Other Ambulatory Visit: Payer: Self-pay

## 2024-07-22 DIAGNOSIS — E785 Hyperlipidemia, unspecified: Secondary | ICD-10-CM | POA: Diagnosis not present

## 2024-07-22 DIAGNOSIS — I10 Essential (primary) hypertension: Secondary | ICD-10-CM | POA: Diagnosis not present

## 2024-07-22 DIAGNOSIS — E1165 Type 2 diabetes mellitus with hyperglycemia: Secondary | ICD-10-CM | POA: Diagnosis not present

## 2024-07-22 DIAGNOSIS — D696 Thrombocytopenia, unspecified: Secondary | ICD-10-CM | POA: Diagnosis not present

## 2024-07-22 DIAGNOSIS — E872 Acidosis, unspecified: Secondary | ICD-10-CM | POA: Diagnosis not present

## 2024-07-22 DIAGNOSIS — R6520 Severe sepsis without septic shock: Secondary | ICD-10-CM | POA: Diagnosis not present

## 2024-07-22 DIAGNOSIS — Z794 Long term (current) use of insulin: Secondary | ICD-10-CM | POA: Diagnosis not present

## 2024-07-22 DIAGNOSIS — A419 Sepsis, unspecified organism: Secondary | ICD-10-CM | POA: Diagnosis not present

## 2024-07-22 DIAGNOSIS — N3001 Acute cystitis with hematuria: Secondary | ICD-10-CM | POA: Diagnosis not present

## 2024-07-22 DIAGNOSIS — G8194 Hemiplegia, unspecified affecting left nondominant side: Secondary | ICD-10-CM | POA: Diagnosis not present

## 2024-07-22 DIAGNOSIS — F3289 Other specified depressive episodes: Secondary | ICD-10-CM | POA: Diagnosis not present

## 2024-07-22 DIAGNOSIS — Z87898 Personal history of other specified conditions: Secondary | ICD-10-CM | POA: Diagnosis not present

## 2024-07-22 LAB — GLUCOSE, CAPILLARY
Glucose-Capillary: 233 mg/dL — ABNORMAL HIGH (ref 70–99)
Glucose-Capillary: 254 mg/dL — ABNORMAL HIGH (ref 70–99)
Glucose-Capillary: 319 mg/dL — ABNORMAL HIGH (ref 70–99)

## 2024-07-22 MED ORDER — INSULIN GLARGINE-YFGN 100 UNIT/ML ~~LOC~~ SOLN
18.0000 [IU] | Freq: Every day | SUBCUTANEOUS | Status: DC
  Filled 2024-07-22: qty 0.18

## 2024-07-22 MED ORDER — CEPHALEXIN 500 MG PO CAPS
500.0000 mg | ORAL_CAPSULE | Freq: Three times a day (TID) | ORAL | 0 refills | Status: AC
  Filled 2024-07-22: qty 12, 4d supply, fill #0

## 2024-07-22 MED ORDER — CEPHALEXIN 500 MG PO CAPS: 500.0000 mg | ORAL_CAPSULE | Freq: Three times a day (TID) | ORAL | 0 refills | Status: DC

## 2024-07-22 MED ORDER — LANTUS SOLOSTAR 100 UNIT/ML ~~LOC~~ SOPN: 18.0000 [IU] | PEN_INJECTOR | Freq: Every day | SUBCUTANEOUS | Status: AC

## 2024-07-22 MED ORDER — CEPHALEXIN 500 MG PO CAPS
500.0000 mg | ORAL_CAPSULE | Freq: Three times a day (TID) | ORAL | Status: DC
  Administered 2024-07-22: 500 mg via ORAL
  Filled 2024-07-22: qty 1

## 2024-07-22 MED ORDER — INSULIN GLARGINE-YFGN 100 UNIT/ML ~~LOC~~ SOLN: 15.0000 [IU] | Freq: Every day | SUBCUTANEOUS | Status: DC

## 2024-07-22 NOTE — Discharge Summary (Signed)
 " Physician Discharge Summary   Patient: Chad Combs MRN: [id number] DOB: [birth year]  Admit date:     07/19/2024  Discharge date: 07/22/24  Discharge Physician: Charlie Patterson   PCP: Laurine Gladden, MD   Recommendations at discharge:   Follow-up team at rehab 1 day  Discharge Diagnoses: Principal Problem:   Severe sepsis (HCC) Active Problems:   Acute cystitis with hematuria   Lactic acidosis   Uncontrolled type 2 diabetes mellitus with hyperglycemia, with long-term current use of insulin  (HCC)   Left hemiplegia (HCC)   Benign essential HTN   Hyperlipidemia   History of seizure   Depression   Thrombocytopenia  Resolved Problems:   * No resolved hospital problems. [facility] Course: 69 y.o. male [facility] resident with medical history significant for COPD not on home oxygen, history of stroke with residual left-sided hemiparesis, DVT/PE, AAA, seizure disorder HTN, tobacco abuse, PVD, depression, HFpEF, DM who presented to ED with suprapubic discomfort and concern for infection.  Patient stated that he was feeling poorly this morning.  Complaining of burning urination and pain over the suprapubic area.  Patient also stated that he had a cough and congestion.  Facility would give him nebulizer but without improvement.  Patient has history of seizure but there was none like that. EMS noted temperature of 103.1, given 1 g of Tylenol , tachycardic at 120 and saturating 90% on room air.  Glucose was greater than 500 so they gave him 10 units of insulin  prior to EMS arrival. Patient denied any chest pain, palpitation, nausea, vomiting, diarrhea.  He denies any hematemesis or melena.   ED Course: Upon arrival to the ED, patient is found to be tachycardic around 108 and tachypneic around 24, lactic acidosis with 3.8, bicarb of 19 white count of 17.4, urine analysis was positive for UTI.  Respiratory viral panel was checked which was negative, blood cultures were sent, urine  cultures were sent, patient received ceftriaxone  and hospitalist service was consulted for evaluation for admission.  1/30.  White blood cell count trending better at 12.7.  No repeat lactic acid today.  Urine culture still pending. 1/31.  Urine culture did not grow out any significant growth despite urine analysis being positive.  Continue antibiotics for now.  Lactic acid down to 1.0 and white blood cell count normal at 7.7.  Patient stable for disposition but transportation not wanting to facilities today secondary to the snow. 2/1.  May be able to set up transportation.  Assessment and Plan: * Severe sepsis (HCC) Present on admission with lactic acidosis, tachycardia, leukocytosis and acute cystitis with hematuria.  On empiric Rocephin  for 3 days and will switch over to p.o. Keflex .  Urine culture did not grow out any significant growth despite urine analysis being positive.  White blood cell count and lactic acid normalized.  Blood cultures so far negative.  Patient stable for disposition.  Lactic acidosis Lactic acid normalized  Uncontrolled type 2 diabetes mellitus with hyperglycemia, with long-term current use of insulin  (HCC) Increased Lantus  insulin  to 18 units at night.  Check fingersticks 3 times daily with meals and anytime having symptoms.  Thrombocytopenia Likely secondary to sepsis  Depression On Zoloft   History of seizure On Keppra  and valproic acid  Hyperlipidemia On Lipitor  Benign essential HTN On metoprolol   Left hemiplegia (HCC) History of stroke.  On aspirin , Plavix  and Lipitor         Consultants: None Procedures performed: None Disposition: Long-term care Diet recommendation:  Carb modified diet DISCHARGE MEDICATION: Allergies as of 07/22/2024       Reactions   Bee Pollen Hives, Swelling        Medication List     STOP taking these medications    budesonide-formoterol  80-4.5 MCG/ACT inhaler Commonly known as: SYMBICORT   docusate  sodium 100 MG capsule Commonly known as: COLACE   ketorolac  0.4 % Soln Commonly known as: ACULAR    Levemir  FlexTouch 100 UNIT/ML FlexTouch Pen Generic drug: insulin  detemir   potassium chloride  SA 20 MEQ tablet Commonly known as: KLOR-CON  M   prednisoLONE  acetate 1 % ophthalmic suspension Commonly known as: PRED FORTE    senna 8.6 MG Tabs tablet Commonly known as: SENOKOT   simethicone  125 MG chewable tablet Commonly known as: MYLICON       TAKE these medications    acetaminophen  650 MG CR tablet Commonly known as: TYLENOL  Take 650 mg by mouth 3 (three) times daily.   alum & mag hydroxide-simeth 200-200-20 MG/5ML suspension Commonly known as: MAALOX/MYLANTA Take 30 mLs by mouth every 4 (four) hours as needed for indigestion or flatulence.   Artificial Tears 0.2-0.2-1 % Soln Generic drug: Glycerin-Hypromellose-PEG 400 Place 1 drop into the left eye 3 (three) times daily.   aspirin  81 MG chewable tablet Chew 81 mg by mouth daily.   atorvastatin  10 MG tablet Commonly known as: LIPITOR Take 10 mg by mouth at bedtime.   cephALEXin  500 MG capsule Commonly known as: KEFLEX  Take 1 capsule (500 mg total) by mouth every 8 (eight) hours for 4 days.   clopidogrel  75 MG tablet Commonly known as: PLAVIX  Take 1 tablet (75 mg total) by mouth daily. Resume on 09/20/2020 if CBC stable   diclofenac  Sodium 1 % Gel Commonly known as: VOLTAREN  Apply 2 g topically 3 (three) times daily.   divalproex  125 MG DR tablet Commonly known as: DEPAKOTE  Take 125 mg by mouth at bedtime.   ezetimibe  10 MG tablet Commonly known as: ZETIA  Take 1 tablet (10 mg total) by mouth daily at 6 PM. What changed: when to take this   gabapentin  300 MG capsule Commonly known as: NEURONTIN  Take 300 mg by mouth at bedtime.   ketoconazole 2 % cream Commonly known as: NIZORAL Apply 1 Application topically every 12 (twelve) hours.   Lantus  SoloStar 100 UNIT/ML Solostar Pen Generic drug: insulin   glargine Inject 18 Units into the skin at bedtime. What changed: how much to take   levETIRAcetam  250 MG tablet Commonly known as: KEPPRA  Take 250 mg by mouth 2 (two) times daily.   melatonin 5 MG Tabs Take 5 mg by mouth at bedtime.   metoprolol  tartrate 25 MG tablet Commonly known as: LOPRESSOR  Take 0.5 tablets (12.5 mg total) by mouth 2 (two) times daily.   nystatin  powder Commonly known as: MYCOSTATIN /NYSTOP  Apply 1 Application topically 2 (two) times daily.   OMEGA-3 COMPLEX PO Take 300 mg by mouth daily.   ondansetron  4 MG tablet Commonly known as: ZOFRAN  Take 4 mg by mouth at bedtime as needed for nausea or vomiting.   pantoprazole  40 MG tablet Commonly known as: PROTONIX  Take 1 tablet (40 mg total) by mouth 2 (two) times daily for 90 days, THEN 1 tablet (40 mg total) daily. Start taking on: September 17, 2020   pantoprazole  20 MG tablet Commonly known as: PROTONIX  Take 20 mg by mouth daily.   polyethylene glycol 17 g packet Commonly known as: MIRALAX  / GLYCOLAX  Take 17 g by mouth daily as needed for  mild constipation.   pregabalin  25 MG capsule Commonly known as: LYRICA  Take 25 mg by mouth daily.   senna-docusate 8.6-50 MG tablet Commonly known as: Senokot-S Take 1 tablet by mouth 2 (two) times daily as needed for mild constipation.   sertraline  100 MG tablet Commonly known as: ZOLOFT  Take 125 mg by mouth daily. Take 1 tablet of the 100mg  and 1 tablet of the 25mg  to total 125mg  daily   sertraline  25 MG tablet Commonly known as: ZOLOFT  Take 125 mg by mouth daily. Take 1 tablet of the 25mg  with 1 tablet of the 100mg  to total 125mg  daily   Systane 0.4-0.3 % Soln Generic drug: Polyethyl Glycol-Propyl Glycol Place 1 drop into both eyes every 6 (six) hours as needed (dry eyes).   tamsulosin  0.4 MG Caps capsule Commonly known as: FLOMAX  Take 1 capsule (0.4 mg total) by mouth daily after supper.   Trulicity  1.5 MG/0.5ML Soaj Generic drug: Dulaglutide  Inject  1.5 mg into the skin once a week. Friday        Follow-up Information     Laurine Gladden, MD Follow up.   Specialty: Family Medicine Why: hospital follow up Contact information: [address] [place] [address] [phone]                Discharge Exam: Fredricka Weights   07/19/24 2115  Weight: 90.9 kg   Physical Exam HENT:     Head: Normocephalic.  Eyes:     General: Lids are normal.     Conjunctiva/sclera: Conjunctivae normal.  Cardiovascular:     Rate and Rhythm: Normal rate and regular rhythm.     Heart sounds: Normal heart sounds, S1 normal and S2 normal.  Pulmonary:     Breath sounds: No decreased breath sounds, wheezing, rhonchi or rales.  Abdominal:     Palpations: Abdomen is soft.     Tenderness: There is no abdominal tenderness.  Musculoskeletal:     Right lower leg: No swelling.     Left lower leg: No swelling.  Skin:    General: Skin is warm.     Findings: No rash.  Neurological:     Mental Status: He is alert.     Comments: Left-sided problems      Condition at discharge: stable  The results of significant diagnostics from this hospitalization (including imaging, microbiology, ancillary and laboratory) are listed below for reference.   Imaging Studies: CT CHEST ABDOMEN PELVIS W CONTRAST Result Date: 07/19/2024 CLINICAL DATA:  Sepsis. EXAM: CT CHEST, ABDOMEN, AND PELVIS WITH CONTRAST TECHNIQUE: Multidetector CT imaging of the chest, abdomen and pelvis was performed following the standard protocol during bolus administration of intravenous contrast. RADIATION DOSE REDUCTION: This exam was performed according to the departmental dose-optimization program which includes automated exposure control, adjustment of the mA and/or kV according to patient size and/or use of iterative reconstruction technique. CONTRAST:  OMNIPAQUE  IOHEXOL  300 MG/ML  SOLN COMPARISON:  Chest CT dated 06/10/2022 and CT abdomen pelvis dated 09/13/2020.  FINDINGS: CT CHEST FINDINGS Cardiovascular: There is no cardiomegaly or pericardial effusion. Three-vessel coronary vascular calcification. Moderate atherosclerotic calcification of the thoracic aorta. Similar appearance of a focal dilatation of the aortic arch. No aortic dissection. Bilateral common carotid artery plaque noted. The origins of the great vessels of the aortic arch and the central pulmonary arteries appear patent. Mediastinum/Nodes: No hilar or mediastinal adenopathy. The esophagus is grossly unremarkable. No mediastinal fluid collection. Lungs/Pleura: Background of emphysema. No focal consolidation, pleural effusion, pneumothorax.  The central airways are patent. Musculoskeletal: No acute osseous pathology. CT ABDOMEN PELVIS FINDINGS No intra-abdominal free air or free fluid. Hepatobiliary: Fatty liver. No biliary ductal dilatation. Small gallstones. No pericholecystic fluid or evidence of acute cholecystitis by CT. Pancreas: Unremarkable. No pancreatic ductal dilatation or surrounding inflammatory changes. Spleen: Normal in size without focal abnormality. Adrenals/Urinary Tract: The adrenal glands are unremarkable. There is no hydronephrosis on either side. There is symmetric enhancement and excretion of contrast by both kidneys. The visualized ureters and urinary bladder appear unremarkable. Stomach/Bowel: There is no bowel obstruction or active inflammation. The appendix is normal. Vascular/Lymphatic: Prior aorto bi iliac endovascular stent graft repair of infrarenal abdominal aortic aneurysm. No significant interval change in the size of the excluded aneurysmal lumen. 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 The IVC is unremarkable. An infrarenal IVC filter is in place. No portal venous gas. There is no adenopathy. Reproductive: The prostate and seminal vesicles are grossly remarkable. Other: Small fat containing bilateral inguinal hernias.  Musculoskeletal: Osteopenia with degenerative changes. Chronic compression fractures of the superior endplates of T12 and L2. No acute osseous pathology. IMPRESSION: 1. No acute intrathoracic, abdominal, or pelvic pathology. 2. Fatty liver. 3. Cholelithiasis. 4. No bowel obstruction. Normal appendix. 5. Aortic Atherosclerosis (ICD10-I70.0) and Emphysema (ICD10-J43.9). Given the presence of pulmonary emphysema, an independent risk factor for lung cancer, consider evaluating the patient for a low-dose CT lung cancer screening program. Electronically Signed   By: Vanetta Chou M.D.   On: 07/19/2024 16:33   DG Chest Port 1 View Result Date: 07/19/2024 CLINICAL DATA:  Questionable sepsis. EXAM: PORTABLE CHEST 1 VIEW COMPARISON:  Chest radiograph dated 06/10/2022. FINDINGS: No focal consolidation, pleural effusion or pneumothorax. The cardiac silhouette is within normal limits. No acute osseous pathology. IMPRESSION: No active disease. Electronically Signed   By: Vanetta Chou M.D.   On: 07/19/2024 13:34    Microbiology: Results for orders placed or performed during the hospital encounter of 07/19/24  Blood Culture (routine x 2)     Status: None (Preliminary result)   Collection Time: 07/19/24 12:00 PM   Specimen: BLOOD  Result Value Ref Range Status   Specimen Description BLOOD BLOOD LEFT ARM  Final   Special Requests   Final    BOTTLES DRAWN AEROBIC AND ANAEROBIC Blood Culture adequate volume   Culture   Final    NO GROWTH 3 DAYS Performed at [facility], [address] [place], [place] [address]    Report Status PENDING  Incomplete  Blood Culture (routine x 2)     Status: None (Preliminary result)   Collection Time: 07/19/24 12:00 PM   Specimen: BLOOD  Result Value Ref Range Status   Specimen Description BLOOD RIGHT ANTECUBITAL  Final   Special Requests   Final    BOTTLES DRAWN AEROBIC AND ANAEROBIC Blood Culture results may not be optimal due to an inadequate volume of blood  received in culture bottles   Culture   Final    NO GROWTH 3 DAYS Performed at [facility], [address] [place], [place] [address]    Report Status PENDING  Incomplete  Resp panel by RT-PCR (RSV, Flu A&B, Covid) Anterior Nasal Swab     Status: None   Collection Time: 07/19/24 12:01 PM   Specimen: Anterior Nasal Swab  Result Value Ref Range Status   SARS Coronavirus 2 by RT PCR NEGATIVE NEGATIVE Final    Comment: (NOTE) SARS-CoV-2 target  nucleic acids are NOT DETECTED.  The SARS-CoV-2 RNA is generally detectable in upper respiratory specimens during the acute phase of infection. The lowest concentration of SARS-CoV-2 viral copies this assay can detect is 138 copies/mL. A negative result does not preclude SARS-Cov-2 infection and should not be used as the sole basis for treatment or other patient management decisions. A negative result may occur with  improper specimen collection/handling, submission of specimen other than nasopharyngeal swab, presence of viral mutation(s) within the areas targeted by this assay, and inadequate number of viral copies(<138 copies/mL). A negative result must be combined with clinical observations, patient history, and epidemiological information. The expected result is Negative.  Fact Sheet for Patients:  bloggercourse.com  Fact Sheet for Healthcare Providers:  seriousbroker.it  This test is no t yet approved or cleared by the [place]  FDA and  has been authorized for detection and/or diagnosis of SARS-CoV-2 by FDA under an Emergency Use Authorization (EUA). This EUA will remain  in effect (meaning this test can be used) for the duration of the COVID-19 declaration under Section 564(b)(1) of the Act, 21 U.S.C.section 360bbb-3(b)(1), unless the authorization is terminated  or revoked sooner.       Influenza A by PCR NEGATIVE NEGATIVE Final   Influenza B by PCR NEGATIVE  NEGATIVE Final    Comment: (NOTE) The Xpert Xpress SARS-CoV-2/FLU/RSV plus assay is intended as an aid in the diagnosis of influenza from Nasopharyngeal swab specimens and should not be used as a sole basis for treatment. Nasal washings and aspirates are unacceptable for Xpert Xpress SARS-CoV-2/FLU/RSV testing.  Fact Sheet for Patients: bloggercourse.com  Fact Sheet for Healthcare Providers: seriousbroker.it  This test is not yet approved or cleared by the [place]  FDA and has been authorized for detection and/or diagnosis of SARS-CoV-2 by FDA under an Emergency Use Authorization (EUA). This EUA will remain in effect (meaning this test can be used) for the duration of the COVID-19 declaration under Section 564(b)(1) of the Act, 21 U.S.C. section 360bbb-3(b)(1), unless the authorization is terminated or revoked.     Resp Syncytial Virus by PCR NEGATIVE NEGATIVE Final    Comment: (NOTE) Fact Sheet for Patients: bloggercourse.com  Fact Sheet for Healthcare Providers: seriousbroker.it  This test is not yet approved or cleared by the [place]  FDA and has been authorized for detection and/or diagnosis of SARS-CoV-2 by FDA under an Emergency Use Authorization (EUA). This EUA will remain in effect (meaning this test can be used) for the duration of the COVID-19 declaration under Section 564(b)(1) of the Act, 21 U.S.C. section 360bbb-3(b)(1), unless the authorization is terminated or revoked.  Performed at [facility], [address] [place], [place] [address]   Urine Culture     Status: Abnormal   Collection Time: 07/19/24 12:01 PM   Specimen: Urine, Random  Result Value Ref Range Status   Specimen Description   Final    URINE, RANDOM Performed at [facility], [address] [place], [place] [address]    Special Requests   Final    NONE  Reflexed from [id number] Performed at [facility], [address] [place], [place] [address]    Culture (A)  Final    <10,000 COLONIES/mL INSIGNIFICANT GROWTH Performed at [facility], [address] [place], [place] [address]    Report Status 07/21/2024 FINAL  Final    Labs: CBC: Recent Labs  Lab 07/19/24 1200 07/20/24 0740 07/21/24 0555  WBC 17.4* 12.7* 7.7  NEUTROABS  15.6*  --   --   HGB 14.7 12.7* 12.7*  HCT 45.3 37.4* 38.8*  MCV 99.6 97.9 100.3*  PLT 159 126* 124*   Basic Metabolic Panel: Recent Labs  Lab 07/19/24 1200 07/20/24 0740  NA 138 135  K 3.8 3.4*  CL 103 103  CO2 19* 22  GLUCOSE 287* 203*  BUN 12 11  CREATININE 1.20 1.07  CALCIUM  8.9 8.3*   Liver Function Tests: Recent Labs  Lab 07/19/24 1200 07/20/24 0740  AST 26 18  ALT 31 20  ALKPHOS 88 62  BILITOT 1.9* 1.3*  PROT 7.3 6.6  ALBUMIN 4.0 3.3*   CBG: Recent Labs  Lab 07/21/24 1209 07/21/24 1648 07/21/24 2054 07/22/24 0838 07/22/24 1209  GLUCAP 243* 271* 203* 233* 254*    Discharge time spent: greater than 30 minutes.  Signed: Charlie Patterson, MD [facility] 07/22/2024 "

## 2024-07-22 NOTE — Plan of Care (Signed)

## 2024-07-22 NOTE — TOC Progression Note (Signed)
 Transition of Care [facility]) - Progression Note    Patient Details  Name: ASHVIK GRUNDMAN MRN: [id number] Date of Birth: 27-Nov-1955  [facility] CM/SW Contact  Lorraine LILLETTE Fenton, LCSW Phone Number: 07/22/2024, 2:45 PM  Clinical Narrative:     Assistant Director called WOM and discussed discharge/return.  CSW reached out to MD for DS and script.  [facility] called after Medical necessity completed.  [facility] was very honest that they have multiple transports ahead of him and will try but still could be Monday. ICM following call report info is [phone].  Expected Discharge Plan: Skilled Nursing Facility Barriers to Discharge: Transportation               Expected Discharge Plan and Services   Discharge Planning Services: CM Consult   Living arrangements for the past 2 months: Skilled Nursing Facility Expected Discharge Date: 07/22/24               DME Arranged: N/A         HH Arranged: NA           Social Drivers of Health (SDOH) Interventions SDOH Screenings   Food Insecurity: No Food Insecurity (07/19/2024)  Housing: Low Risk (07/19/2024)  Transportation Needs: No Transportation Needs (07/19/2024)  Utilities: Not At Risk (07/19/2024)  Social Connections: Socially Isolated (07/19/2024)  Tobacco Use: Medium Risk (06/10/2022)    Readmission Risk Interventions     No data to display

## 2024-07-24 LAB — CULTURE, BLOOD (ROUTINE X 2)
Culture: NO GROWTH
Culture: NO GROWTH
Special Requests: ADEQUATE
